# Patient Record
Sex: Male | Born: 1940 | Race: White | Hispanic: No | Marital: Married | Smoking: Former smoker
Health system: Southern US, Community
[De-identification: ages and names within clinical notes are randomized; demographics above are authoritative.]

## PROBLEM LIST (undated history)

## (undated) DIAGNOSIS — H409 Unspecified glaucoma: Secondary | ICD-10-CM

## (undated) DIAGNOSIS — C679 Malignant neoplasm of bladder, unspecified: Secondary | ICD-10-CM

## (undated) DIAGNOSIS — M1990 Unspecified osteoarthritis, unspecified site: Secondary | ICD-10-CM

## (undated) DIAGNOSIS — J449 Chronic obstructive pulmonary disease, unspecified: Secondary | ICD-10-CM

## (undated) DIAGNOSIS — M5030 Other cervical disc degeneration, unspecified cervical region: Secondary | ICD-10-CM

## (undated) DIAGNOSIS — IMO0001 Reserved for inherently not codable concepts without codable children: Secondary | ICD-10-CM

## (undated) DIAGNOSIS — T4145XA Adverse effect of unspecified anesthetic, initial encounter: Secondary | ICD-10-CM

## (undated) DIAGNOSIS — Q639 Congenital malformation of kidney, unspecified: Secondary | ICD-10-CM

## (undated) DIAGNOSIS — I10 Essential (primary) hypertension: Secondary | ICD-10-CM

## (undated) DIAGNOSIS — E119 Type 2 diabetes mellitus without complications: Secondary | ICD-10-CM

## (undated) DIAGNOSIS — E7800 Pure hypercholesterolemia, unspecified: Secondary | ICD-10-CM

## (undated) HISTORY — DX: Unspecified glaucoma: H40.9

## (undated) HISTORY — DX: Malignant neoplasm of bladder, unspecified: C67.9

## (undated) HISTORY — DX: Essential (primary) hypertension: I10

## (undated) HISTORY — PX: EYE SURGERY: SHX253

## (undated) HISTORY — DX: Pure hypercholesterolemia, unspecified: E78.00

## (undated) HISTORY — PX: TONSILLECTOMY: SUR1361

## (undated) HISTORY — PX: COLONOSCOPY W/ POLYPECTOMY: SHX1380

## (undated) HISTORY — DX: Unspecified osteoarthritis, unspecified site: M19.90

---
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
## 1987-04-03 HISTORY — PX: CERVICAL DISC SURGERY: SHX588

## 1999-03-21 ENCOUNTER — Ambulatory Visit (HOSPITAL_COMMUNITY): Admission: RE | Admit: 1999-03-21 | Discharge: 1999-03-21 | Payer: Self-pay | Admitting: Gastroenterology

## 1999-03-21 ENCOUNTER — Encounter (INDEPENDENT_AMBULATORY_CARE_PROVIDER_SITE_OTHER): Payer: Self-pay

## 1999-04-03 DIAGNOSIS — T8859XA Other complications of anesthesia, initial encounter: Secondary | ICD-10-CM

## 1999-04-03 DIAGNOSIS — C679 Malignant neoplasm of bladder, unspecified: Secondary | ICD-10-CM

## 1999-04-03 HISTORY — DX: Other complications of anesthesia, initial encounter: T88.59XA

## 1999-04-03 HISTORY — PX: BLADDER SURGERY: SHX569

## 1999-04-03 HISTORY — DX: Malignant neoplasm of bladder, unspecified: C67.9

## 1999-08-24 ENCOUNTER — Encounter: Payer: Self-pay | Admitting: Urology

## 1999-08-25 ENCOUNTER — Ambulatory Visit (HOSPITAL_COMMUNITY): Admission: RE | Admit: 1999-08-25 | Discharge: 1999-08-25 | Payer: Self-pay | Admitting: Dermatology

## 1999-08-25 ENCOUNTER — Encounter (INDEPENDENT_AMBULATORY_CARE_PROVIDER_SITE_OTHER): Payer: Self-pay | Admitting: Specialist

## 1999-09-26 ENCOUNTER — Encounter (INDEPENDENT_AMBULATORY_CARE_PROVIDER_SITE_OTHER): Payer: Self-pay

## 1999-09-26 ENCOUNTER — Ambulatory Visit (HOSPITAL_COMMUNITY): Admission: RE | Admit: 1999-09-26 | Discharge: 1999-09-26 | Payer: Self-pay | Admitting: Dermatology

## 2000-02-16 ENCOUNTER — Encounter (INDEPENDENT_AMBULATORY_CARE_PROVIDER_SITE_OTHER): Payer: Self-pay | Admitting: Specialist

## 2000-02-16 ENCOUNTER — Ambulatory Visit (HOSPITAL_COMMUNITY): Admission: RE | Admit: 2000-02-16 | Discharge: 2000-02-16 | Payer: Self-pay | Admitting: Urology

## 2000-06-03 ENCOUNTER — Other Ambulatory Visit: Admission: RE | Admit: 2000-06-03 | Discharge: 2000-06-03 | Payer: Self-pay | Admitting: Urology

## 2002-05-08 ENCOUNTER — Ambulatory Visit (HOSPITAL_COMMUNITY): Admission: RE | Admit: 2002-05-08 | Discharge: 2002-05-08 | Payer: Self-pay | Admitting: Gastroenterology

## 2002-05-08 ENCOUNTER — Encounter (INDEPENDENT_AMBULATORY_CARE_PROVIDER_SITE_OTHER): Payer: Self-pay | Admitting: Specialist

## 2007-04-11 ENCOUNTER — Encounter: Admission: RE | Admit: 2007-04-11 | Discharge: 2007-04-11 | Payer: Self-pay | Admitting: Internal Medicine

## 2007-04-18 ENCOUNTER — Encounter: Payer: Self-pay | Admitting: Cardiology

## 2010-08-18 NOTE — Op Note (Signed)
[facility]  Patient:    Justin Atkinson, Justin Atkinson                      MRN: [id number] Proc. Date: 09/26/99 Adm. 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  OPERATION PERFORMED:  Cystourethroscopy, cold cut biopsy of tumor resection margin.  SURGEON:  Lucrezia Starch. Ovidio Hanger, M.D.  ANESTHESIA:  General laryngeal airway.  ESTIMATED BLOOD LOSS:  Negligible.  TUBES:  None.  COMPLICATIONS:  None.  INDICATION FOR PROCEDURE:  The patient is a very nice 70 year old white male who presented with gross hematuria and was found to have a right bladder wall lesion. It was subsequently resected TUR bladder tumor and the final pathology revealed grade 3 superficial transitional cell carcinoma. It was felt that intravesical chemotherapy was probably indicated but that biopsy of the margin resection was probably indicated to make sure there are no further cells present and that there was no invasive process. After understanding the risks, benefits, and alternatives, he elected to proceed.  DESCRIPTION OF PROCEDURE:  The patient was placed in supine position and after proper general laryngeal airway anesthesia was placed in the dorsal lithotomy position and prepped and draped with Betadine in a sterile fashion. Cystourethroscopy was performed with a 23.5 [place] Olympus panendoscope. The meatus was a bit tight and needed to be dilated to 28 [place] with [employer]. There was mild trilobar hypertrophy. Efflux of clear urine was noted from the right ureteral orifice. The left ureteral orifice was somewhat under the bladder neck and somewhat difficult  to visualize although I think I saw it obviously it was well away from any area of resection although it was difficult to visualize. The area of tumor resection was healing well and with cold biopsy forceps, 4 quadrant biopsies were obtained at the margin of it and submitted to pathology together. The base was cauterized with Bugbee coagulation cautery and no further lesions were noted to be present. The bladder was drained. The panendoscope was removed and the patient was taken to the recovery room stable. DD:  09/26/99 TD:  09/27/99 Job: [id number]

## 2010-08-18 NOTE — Op Note (Signed)
[facility]  Patient:    Justin Atkinson, Justin Atkinson                     MRN: [id number] Proc. Date: 02/16/00 Attending:  Lucrezia Starch. Ovidio Hanger, M.D.                           Operative Report  DIAGNOSIS:  History of bladder tumor, status post Bacillus Calmette-Guerin intravesical immunotherapy.  OPERATIVE PROCEDURE:  Cystourethroscopy exam under anesthesia, bladder biopsy.  SURGEON:  Lucrezia Starch. Ovidio Hanger, M.D.  ANESTHESIA:  General laryngeal airway.  ESTIMATED BLOOD LOSS:  Negligible.  TUBES:  None.  COMPLICATIONS:  None.  INDICATION FOR PROCEDURE:  [name] is a very nice 70 year old white male who has a known solitary right kidney.  He presented with a large left bladder tumor and subsequently underwent cystoscopy and transurethral resection of the bladder tumor which revealed superficial disease only.  It was focally high in grade, but no invasion was noted.  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  It was felt due to the high-grade nature of the tumor, that he should undergo intravesical BCG, and this was subsequently performed in the office, six treatments in all.  He is for follow-up cystoscopy exam and biopsy.  DESCRIPTION OF PROCEDURE:  The patient was placed in the supine position after proper general laryngeal airway anesthesia, was placed in the dorsal lithotomy position and prepped and draped with Betadine in a sterile fashion. Cystourethroscopy was performed with a 22.5 [place] ACMI panendoscope utilizing the 12 and 70 degree lenses.  The bladder was carefully inspected.  A solitary right renal orifice was noted to be present and was essentially normal.  The bladder was inspected carefully, had grade 1 trabeculation.  There was mild trilobar hypertrophy of the prostate, but no lesions were noted to be present. Utilizing the cold-cup  biopsy forceps, biopsy was taken in the posterior bladder wall to the right in the region of where the tumor originally had been and submitted to pathology.  The base was cauterized with Bugbee coagulation cautery.  No other lesions were noted to be present.  The bladder was drained. The panendoscope was removed, and the patient was taken to the recovery room stable.  PLAN:  The plan is to follow him up in three months for cystourethroscopy pending the biopsy results.  He will call next week for the biopsy results and will proceed accordingly. DD:  02/16/00 TD:  02/16/00 Job: [id number]

## 2010-08-18 NOTE — Op Note (Signed)
NAME:  Justin Atkinson, Justin Atkinson                         ACCOUNT NO.:  [id number]   MEDICAL RECORD NO.:  [id number]                   PATIENT TYPE:  AMB   LOCATION:  ENDO                                 FACILITY:  [facility]   PHYSICIAN:  Petra Kuba, M.D.                 DATE OF BIRTH:  1940/12/16   DATE OF PROCEDURE:  05/08/2002  DATE OF DISCHARGE:                                 OPERATIVE REPORT   PROCEDURE:  Colonoscopy with biopsy.   INDICATIONS:  The patient has a history of adenomatous polyps, due for  repeat screening.  Consent was signed after risks, benefits, methods, and  options thoroughly discussed in the office in the past.   MEDICATIONS:  Demerol 75 mg, Versed 6 mg.   DESCRIPTION OF PROCEDURE:  Rectal inspection was pertinent for external  hemorrhoids, small.  Digital exam is negative.  The regular colonoscope was  inserted, fairly easily advanced around the colon to the cecum.  This did  require some abdominal pressure and no position changes.  On insertion no  abnormalities were seen.  The cecum was identified by the appendiceal  orifice and the ileocecal valve.  The scope was slowly withdrawn.  The prep  was adequate.  There was some liquid stool that required washing and  suctioning.  On slow withdrawal through the colon, the cecum, ascending,  transverse, and descending were all normal.  There was a rare early left-  sided diverticulum, some scarring in the sigmoid from previous polypectomy  sites was seen without any residual polyps.  At the sigmoid-rectal junction,  a tiny probable hyperplastic-appearing polyp was seen and was cold biopsied  x2.  The scope was withdrawn back to the rectum and then retroflexed,  pertinent for some internal hemorrhoids.  The scope was straightened and  advanced a short way up the left side of the colon, air was suctioned, the  scope removed.  The patient tolerated the procedure well.  There was no  obvious immediate complication.   ENDOSCOPIC DIAGNOSES:  1. Small internal-external hemorrhoids.  2. Tiny rectosigmoid junction probably hyperplastic-appearing polyp, cold     biopsied.  3. Rare early left-sided diverticula.  4. Otherwise within normal limits to the cecum.   PLAN:  Await pathology but probably but probably recheck colon screening in  five years.  Happy to see back p.r.n.  Otherwise return care to Dr. Waynard Edwards  for the customary health care maintenance to include yearly rectals and  guaiacs.                                               Petra Kuba, M.D.    MEM/MEDQ  D:  05/08/2002  T:  05/08/2002  Job:  [id number]   cc:   Loraine Leriche A. Perini,  M.D.  [address]  [place]  [place] [address]  Fax: [phone]

## 2010-08-18 NOTE — Op Note (Signed)
[facility]  Patient:    Justin Atkinson, Justin Atkinson                        MRN: [id number] Proc. Date: 08/25/99 Attending:  Lucrezia Starch. Ovidio Hanger, M.D.                           Operative Report  DIAGNOSES:  Bladder tumor, gross hematuria.  PROCEDURE:  Cystourethroscopy, transurethral resection of bladder tumor.  SURGEON:  Lucrezia Starch. Ovidio Hanger, M.D.  ANESTHESIA:  General laryngeal airway.  ESTIMATED BLOOD LOSS:  15 cc.  TUBES:  A 22 French 5 cc single-lumen Foley catheter.  COMPLICATIONS:  None.  INDICATION FOR PROCEDURE:  Justin Atkinson is a very nice 70 year old white male who had developed gross painless hematuria.  He has had this on and off since October 2000.  He has had three to four episodes.  Recently had pink urine. He underwent workup in the office, and on flexible cystourethroscopy, he was noted to have a large bladder tumor that was pedunculated on the right lateral bladder wall.  After understanding the risks, benefits, and alternatives, he has elected to undergo the above procedure.  DESCRIPTION OF PROCEDURE:  The patient was placed in the supine position under proper general laryngeal anesthesia, was placed in the dorsal lithotomy position and prepped and draped with Betadine in sterile fashion.  The urethra was calibrated to 30 [place] with [employer] sound, and a 28 [place] ACMI continuous-flow resectoscope sheath was placed over a 10 _____ obturator. Inspection of the bladder with 30 and 70 degree lenses revealed the tumor appeared to be slightly pedunculated, although it had a sessile base, was on the right lateral bladder wall, well away from the solitary right ureteral orifice.  No other lesions were noted throughout the bladder.  He had mild prostatic enlargement, trilobar, and grade 1 trabeculation.  Utilizing the working element and the 30 degree lens, a transurethral resection of the bladder tumor was resected all the way to the base, which  was resected into the superficial muscle.  No remaining tumor was noted to be present.  The base was cauterized with Bovie coagulation cautery, good hemostasis noted to be obtained.  All chips were evacuated and submitted to pathology.  Reinspection revealed no chips were within the bladder.  The resector sheath was visually removed, and a 22 French 5 cc balloon catheter was passed.  The bladder was noted to be very clear.  The patient was taken to the recovery room in stable condition. DD:  08/25/99 TD:  08/29/99 Job: [id number]

## 2011-04-10 DIAGNOSIS — H4011X0 Primary open-angle glaucoma, stage unspecified: Secondary | ICD-10-CM | POA: Diagnosis not present

## 2011-04-16 DIAGNOSIS — M25559 Pain in unspecified hip: Secondary | ICD-10-CM | POA: Diagnosis not present

## 2011-04-16 DIAGNOSIS — M999 Biomechanical lesion, unspecified: Secondary | ICD-10-CM | POA: Diagnosis not present

## 2011-04-16 DIAGNOSIS — M5137 Other intervertebral disc degeneration, lumbosacral region: Secondary | ICD-10-CM | POA: Diagnosis not present

## 2011-04-20 DIAGNOSIS — M25559 Pain in unspecified hip: Secondary | ICD-10-CM | POA: Diagnosis not present

## 2011-04-20 DIAGNOSIS — M999 Biomechanical lesion, unspecified: Secondary | ICD-10-CM | POA: Diagnosis not present

## 2011-04-20 DIAGNOSIS — M5137 Other intervertebral disc degeneration, lumbosacral region: Secondary | ICD-10-CM | POA: Diagnosis not present

## 2011-04-23 DIAGNOSIS — IMO0002 Reserved for concepts with insufficient information to code with codable children: Secondary | ICD-10-CM | POA: Diagnosis not present

## 2011-04-23 DIAGNOSIS — S335XXA Sprain of ligaments of lumbar spine, initial encounter: Secondary | ICD-10-CM | POA: Diagnosis not present

## 2011-05-23 DIAGNOSIS — M5137 Other intervertebral disc degeneration, lumbosacral region: Secondary | ICD-10-CM | POA: Diagnosis not present

## 2011-05-23 DIAGNOSIS — M25559 Pain in unspecified hip: Secondary | ICD-10-CM | POA: Diagnosis not present

## 2011-05-23 DIAGNOSIS — M999 Biomechanical lesion, unspecified: Secondary | ICD-10-CM | POA: Diagnosis not present

## 2011-07-17 DIAGNOSIS — R7301 Impaired fasting glucose: Secondary | ICD-10-CM | POA: Diagnosis not present

## 2011-07-17 DIAGNOSIS — E785 Hyperlipidemia, unspecified: Secondary | ICD-10-CM | POA: Diagnosis not present

## 2011-07-17 DIAGNOSIS — Z125 Encounter for screening for malignant neoplasm of prostate: Secondary | ICD-10-CM | POA: Diagnosis not present

## 2011-07-17 DIAGNOSIS — C679 Malignant neoplasm of bladder, unspecified: Secondary | ICD-10-CM | POA: Diagnosis not present

## 2011-07-25 DIAGNOSIS — K219 Gastro-esophageal reflux disease without esophagitis: Secondary | ICD-10-CM | POA: Diagnosis not present

## 2011-07-25 DIAGNOSIS — Z125 Encounter for screening for malignant neoplasm of prostate: Secondary | ICD-10-CM | POA: Diagnosis not present

## 2011-07-25 DIAGNOSIS — E785 Hyperlipidemia, unspecified: Secondary | ICD-10-CM | POA: Diagnosis not present

## 2011-07-25 DIAGNOSIS — C679 Malignant neoplasm of bladder, unspecified: Secondary | ICD-10-CM | POA: Diagnosis not present

## 2011-07-25 DIAGNOSIS — Z23 Encounter for immunization: Secondary | ICD-10-CM | POA: Diagnosis not present

## 2011-07-25 DIAGNOSIS — Z0000 Encounter for general adult medical examination without abnormal findings: Secondary | ICD-10-CM | POA: Diagnosis not present

## 2011-07-25 DIAGNOSIS — R7301 Impaired fasting glucose: Secondary | ICD-10-CM | POA: Diagnosis not present

## 2011-07-25 DIAGNOSIS — F17200 Nicotine dependence, unspecified, uncomplicated: Secondary | ICD-10-CM | POA: Diagnosis not present

## 2011-07-26 DIAGNOSIS — Z1212 Encounter for screening for malignant neoplasm of rectum: Secondary | ICD-10-CM | POA: Diagnosis not present

## 2011-08-01 DIAGNOSIS — M545 Low back pain: Secondary | ICD-10-CM | POA: Diagnosis not present

## 2011-08-01 DIAGNOSIS — M5137 Other intervertebral disc degeneration, lumbosacral region: Secondary | ICD-10-CM | POA: Diagnosis not present

## 2011-08-07 ENCOUNTER — Other Ambulatory Visit: Payer: Self-pay | Admitting: Internal Medicine

## 2011-08-07 DIAGNOSIS — N281 Cyst of kidney, acquired: Secondary | ICD-10-CM

## 2011-08-08 ENCOUNTER — Ambulatory Visit
Admission: RE | Admit: 2011-08-08 | Discharge: 2011-08-08 | Disposition: A | Discharge status: Home / Self Care | Payer: Medicare Other | Source: Ambulatory Visit | Attending: Internal Medicine | Admitting: Internal Medicine

## 2011-08-08 DIAGNOSIS — Z905 Acquired absence of kidney: Secondary | ICD-10-CM | POA: Diagnosis not present

## 2011-08-08 DIAGNOSIS — N281 Cyst of kidney, acquired: Secondary | ICD-10-CM

## 2011-08-09 DIAGNOSIS — M5137 Other intervertebral disc degeneration, lumbosacral region: Secondary | ICD-10-CM | POA: Diagnosis not present

## 2011-08-14 DIAGNOSIS — M5137 Other intervertebral disc degeneration, lumbosacral region: Secondary | ICD-10-CM | POA: Diagnosis not present

## 2011-08-16 DIAGNOSIS — M5137 Other intervertebral disc degeneration, lumbosacral region: Secondary | ICD-10-CM | POA: Diagnosis not present

## 2011-08-20 DIAGNOSIS — M5137 Other intervertebral disc degeneration, lumbosacral region: Secondary | ICD-10-CM | POA: Diagnosis not present

## 2011-08-23 DIAGNOSIS — M5137 Other intervertebral disc degeneration, lumbosacral region: Secondary | ICD-10-CM | POA: Diagnosis not present

## 2011-08-28 DIAGNOSIS — M5137 Other intervertebral disc degeneration, lumbosacral region: Secondary | ICD-10-CM | POA: Diagnosis not present

## 2011-08-30 DIAGNOSIS — M5137 Other intervertebral disc degeneration, lumbosacral region: Secondary | ICD-10-CM | POA: Diagnosis not present

## 2011-09-03 ENCOUNTER — Ambulatory Visit (INDEPENDENT_AMBULATORY_CARE_PROVIDER_SITE_OTHER): Payer: Medicare Other | Admitting: Cardiology

## 2011-09-03 ENCOUNTER — Encounter: Payer: Self-pay | Admitting: Cardiology

## 2011-09-03 VITALS — BP 142/73 | HR 56 | Ht 74.0 in | Wt 277.8 lb

## 2011-09-03 DIAGNOSIS — E785 Hyperlipidemia, unspecified: Secondary | ICD-10-CM | POA: Diagnosis not present

## 2011-09-03 DIAGNOSIS — F17200 Nicotine dependence, unspecified, uncomplicated: Secondary | ICD-10-CM

## 2011-09-03 DIAGNOSIS — I10 Essential (primary) hypertension: Secondary | ICD-10-CM

## 2011-09-03 DIAGNOSIS — E7800 Pure hypercholesterolemia, unspecified: Secondary | ICD-10-CM

## 2011-09-03 DIAGNOSIS — Z720 Tobacco use: Secondary | ICD-10-CM | POA: Insufficient documentation

## 2011-09-03 DIAGNOSIS — R079 Chest pain, unspecified: Secondary | ICD-10-CM

## 2011-09-03 DIAGNOSIS — R0789 Other chest pain: Secondary | ICD-10-CM

## 2011-09-03 NOTE — Patient Instructions (Signed)
We will schedule you for a nuclear stress test  You need to quit smoking!   

## 2011-09-03 NOTE — Assessment & Plan Note (Signed)
I explained the importance of smoking cessation for lowering his overall risk. He is interested in quitting and has a prescription for Chantix that he has not filled yet.

## 2011-09-03 NOTE — Assessment & Plan Note (Signed)
This patient has atypical chest pain but does have multiple risk factors for cardiovascular disease. It has been 4 years since his prior stress evaluation. We discussed the options of repeat evaluation versus watchful waiting and he is elected to proceed with repeat stress testing. We will schedule him for a lexiscan Myoview study.

## 2011-09-03 NOTE — Progress Notes (Signed)
Justin Atkinson Date of Birth: Aug 23, 1940 Medical Record #[id number]  History of Present Illness: Justin Atkinson is seen at the request of Dr. Waynard Edwards for evaluation of chest pain. He is a pleasant 71 year old white male. He has a history of hypertension, hyperlipidemia, and tobacco abuse. He states that he has had sporadic chest pain for several years. He describes this as a discomfort in the left upper pectoral region. It is not clearly associated with activity. It occurs about every 3-4 months. It is not clearly associated with anything. He does note some relief with belching or with moving his shoulder. Apparently he was seen in early 2009 and underwent a pharmacologic nuclear stress test at that time. I do not have the results at this moment but apparently it was normal.  Current Outpatient Prescriptions on File Prior to Visit  Medication Sig Dispense Refill  . aspirin 81 MG tablet Take 81 mg by mouth daily.      . Cholecalciferol (VITAMIN D-3 PO) Take 1,000 Units by mouth daily.      Marland Kitchen latanoprost (XALATAN) 0.005 % ophthalmic solution Place 1 drop into both eyes at bedtime.      Marland Kitchen lisinopril (PRINIVIL,ZESTRIL) 10 MG tablet Take 10 mg by mouth daily.      . Multiple Vitamins-Minerals (MULTIVITAMIN PO) Take by mouth daily.      . Omega-3 Fatty Acids (FISH OIL) 1200 MG CAPS Take by mouth. Taking 2 Tablets Daily      . rosuvastatin (CRESTOR) 5 MG tablet Take 5 mg by mouth daily.        No Known Allergies  Past Medical History  Diagnosis Date  . Bladder cancer     Surgery  . DJD (degenerative joint disease)   . Glaucoma   . Hypercholesterolemia   . HTN (hypertension)     Past Surgical History  Procedure Date  . Cervical disc surgery 1989  . Hernia repair 1960  . Bladder surgery     cystoscopic    History  Smoking status  . Current Everyday Smoker -- 1.0 packs/day  Smokeless tobacco  . Not on file    History  Alcohol Use     History reviewed. No pertinent family  history.  Review of Systems: The review of systems is positive for chronic low back pain. MRI revealed degenerative joint disease. As a result he has not been as active. He does have situational stress taking care of a wife who has advanced multiple sclerosis and is disabled.  All other systems were reviewed and are negative.  Physical Exam: BP 142/73  Pulse 56  Ht 6\' 2"  (1.88 m)  Wt 277 lb 12.8 oz (126.009 kg)  BMI 35.67 kg/m2 He is an obese, pleasant white male in no acute distress.The patient is alert and oriented x 3.  The mood and affect are normal.  The skin is warm and dry.  Color is normal.  The HEENT exam reveals that the sclera are nonicteric.  The mucous membranes are moist.  The carotids are 2+ without bruits.  There is no thyromegaly.  There is no JVD.  The lungs are clear.  The chest wall is non tender.  The heart exam reveals a regular rate with a normal S1 and S2.  There are no murmurs, gallops, or rubs.  The PMI is not displaced.   Abdominal exam reveals good bowel sounds.  There is no guarding or rebound.  There is no hepatosplenomegaly or tenderness.  There are no masses.  Exam of the legs reveal no clubbing, cyanosis, or edema.  The legs are without rashes.  The distal pulses are intact.  Cranial nerves II - XII are intact.  Motor and sensory functions are intact.  The gait is normal.  LABORATORY DATA: ECG demonstrates normal sinus rhythm with occasional PVCs. It is otherwise normal.  Assessment / Plan:

## 2011-09-04 DIAGNOSIS — M5137 Other intervertebral disc degeneration, lumbosacral region: Secondary | ICD-10-CM | POA: Diagnosis not present

## 2011-09-04 DIAGNOSIS — F17200 Nicotine dependence, unspecified, uncomplicated: Secondary | ICD-10-CM | POA: Diagnosis not present

## 2011-09-10 DIAGNOSIS — M5137 Other intervertebral disc degeneration, lumbosacral region: Secondary | ICD-10-CM | POA: Diagnosis not present

## 2011-09-14 ENCOUNTER — Encounter: Payer: Self-pay | Admitting: Cardiology

## 2011-09-17 DIAGNOSIS — R972 Elevated prostate specific antigen [PSA]: Secondary | ICD-10-CM | POA: Diagnosis not present

## 2011-09-17 DIAGNOSIS — N320 Bladder-neck obstruction: Secondary | ICD-10-CM | POA: Diagnosis not present

## 2011-09-17 DIAGNOSIS — N400 Enlarged prostate without lower urinary tract symptoms: Secondary | ICD-10-CM | POA: Insufficient documentation

## 2011-09-17 DIAGNOSIS — Z8551 Personal history of malignant neoplasm of bladder: Secondary | ICD-10-CM | POA: Diagnosis not present

## 2011-09-18 ENCOUNTER — Ambulatory Visit (HOSPITAL_COMMUNITY): Payer: Medicare Other | Attending: Cardiology | Admitting: Radiology

## 2011-09-18 VITALS — BP 132/84 | HR 50 | Ht 74.0 in | Wt 276.0 lb

## 2011-09-18 DIAGNOSIS — R079 Chest pain, unspecified: Secondary | ICD-10-CM

## 2011-09-18 DIAGNOSIS — F17200 Nicotine dependence, unspecified, uncomplicated: Secondary | ICD-10-CM | POA: Insufficient documentation

## 2011-09-18 DIAGNOSIS — I10 Essential (primary) hypertension: Secondary | ICD-10-CM

## 2011-09-18 DIAGNOSIS — Z720 Tobacco use: Secondary | ICD-10-CM

## 2011-09-18 DIAGNOSIS — R0602 Shortness of breath: Secondary | ICD-10-CM

## 2011-09-18 DIAGNOSIS — I4949 Other premature depolarization: Secondary | ICD-10-CM

## 2011-09-18 DIAGNOSIS — E7800 Pure hypercholesterolemia, unspecified: Secondary | ICD-10-CM | POA: Diagnosis not present

## 2011-09-18 DIAGNOSIS — E785 Hyperlipidemia, unspecified: Secondary | ICD-10-CM

## 2011-09-18 MED ORDER — TECHNETIUM TC 99M TETROFOSMIN IV KIT
33.0000 | PACK | Freq: Once | INTRAVENOUS | Status: AC | PRN
  Administered 2011-09-18: 33 via INTRAVENOUS

## 2011-09-18 MED ORDER — TECHNETIUM TC 99M TETROFOSMIN IV KIT
11.0000 | PACK | Freq: Once | INTRAVENOUS | Status: AC | PRN
  Administered 2011-09-18: 11 via INTRAVENOUS

## 2011-09-18 MED ORDER — REGADENOSON 0.4 MG/5ML IV SOLN
0.4000 mg | Freq: Once | INTRAVENOUS | Status: AC
  Administered 2011-09-18: 0.4 mg via INTRAVENOUS

## 2011-09-18 NOTE — Progress Notes (Signed)
[facility] SITE 3 NUCLEAR MED [address] [place] [address] [phone]  Cardiology Nuclear Med Study  Justin Atkinson is a 71 y.o. male     MRN : [id number]     DOB: 08/23/1940  Procedure Date: 09/18/2011  Nuclear Med Background Indication for Stress Test:  Evaluation for Ischemia History:  '09 UXL:KGMWNU. Cardiac Risk Factors: Hypertension, Lipids, Obesity and Smoker  Symptoms:  "Dull" Chest Pain (last episode of chest discomfort was 5-6 weeks ago) and DOE   Nuclear Pre-Procedure Caffeine/Decaff Intake:  None NPO After: 8:00pm   Lungs:  Clear. O2 Sat: 95% on room air. IV 0.9% NS with Angio Cath:  18g  IV Site: R Antecubital  IV Started by:  Stanton Kidney, EMT-P  Chest Size (in):  48 Cup Size: n/a  Height: 6\' 2"  (1.88 m)  Weight:  276 lb (125.193 kg)  BMI:  Body mass index is 35.44 kg/(m^2). Tech Comments:  NA    Nuclear Med Study 1 or 2 day study: 1 day  Stress Test Type:  Treadmill/Lexiscan  Reading MD: Kristeen Miss, MD  Order Authorizing Provider:  Peter Swaziland, MD  Resting Radionuclide: Technetium 19m Tetrofosmin  Resting Radionuclide Dose: 10.1 mCi   Stress Radionuclide:  Technetium 39m Tetrofosmin  Stress Radionuclide Dose: 33.0 mCi           Stress Protocol Rest HR: 50 Stress HR: 78  Rest BP: 132/84 Stress BP: 145/72  Exercise Time (min): 2:00 METS: n/a   Predicted Max HR: 151 bpm % Max HR: 51.66 bpm Rate Pressure Product: 27253   Dose of Adenosine (mg):  n/a Dose of Lexiscan: 0.4 mg  Dose of Atropine (mg): n/a Dose of Dobutamine: n/a mcg/kg/min (at max HR)  Stress Test Technologist: Smiley Houseman, CMA-N  Nuclear Technologist:  Domenic Polite, CNMT     Rest Procedure:  Myocardial perfusion imaging was performed at rest 45 minutes following the intravenous administration of Technetium 69m Tetrofosmin.  Rest ECG: Sinus bradycardia with occasional PVC's.  Stress Procedure:  The patient received IV Lexiscan 0.4 mg over 15-seconds  with concurrent low level exercise and then Technetium 85m Tetrofosmin was injected at 30-seconds while the patient continued walking one more minute. There were no significant changes with Lexiscan bolus, occasional PVC's were noted. Quantitative spect images were obtained after a 45-minute delay.  Stress ECG: No significant change from baseline ECG  QPS Raw Data Images:  Normal; no motion artifact; normal heart/lung ratio. Stress Images:  There is a small mild defect in the apex with relatively normal uptake in the remaining areas.  The LV is mildly dilated. Rest Images:  There is a small very mild defect in the apex with relatively normal uptake in the remaining areas.  The LV is mildly dilated. Subtraction (SDS):  There is no significant degree of reversibility.    Transient Ischemic Dilatation (Normal <1.22):  0.96 Lung/Heart Ratio (Normal <0.45):  0.36  Quantitative Gated Spect Images QGS EDV:  158 ml QGS ESV:  72 ml  Impression Exercise Capacity:  Lexiscan with low level exercise. BP Response:  Normal blood pressure response. Clinical Symptoms:  No significant symptoms noted. ECG Impression:  No significant ST segment change suggestive of ischemia. Comparison with Prior Nuclear Study: No images to compare  Overall Impression:  Normal stress nuclear study.  There is a small apical defect that appears to be due to apical thinning.  The apex contracts normally.  The LV is mildly dilated and the EF  is at the lower limits of normal.  LV Ejection Fraction: 54%.  LV Wall Motion:  Normal Wall Motion    Justin Atkinson, Justin Atkinson., MD, [facility] 09/18/2011, 4:51 PM Office - [phone] Pager [phone]

## 2011-10-08 DIAGNOSIS — H409 Unspecified glaucoma: Secondary | ICD-10-CM | POA: Diagnosis not present

## 2011-10-08 DIAGNOSIS — H4011X0 Primary open-angle glaucoma, stage unspecified: Secondary | ICD-10-CM | POA: Diagnosis not present

## 2011-10-25 DIAGNOSIS — Z8551 Personal history of malignant neoplasm of bladder: Secondary | ICD-10-CM | POA: Diagnosis not present

## 2011-12-28 DIAGNOSIS — D239 Other benign neoplasm of skin, unspecified: Secondary | ICD-10-CM | POA: Diagnosis not present

## 2011-12-28 DIAGNOSIS — L909 Atrophic disorder of skin, unspecified: Secondary | ICD-10-CM | POA: Diagnosis not present

## 2011-12-28 DIAGNOSIS — L570 Actinic keratosis: Secondary | ICD-10-CM | POA: Diagnosis not present

## 2011-12-28 DIAGNOSIS — L821 Other seborrheic keratosis: Secondary | ICD-10-CM | POA: Diagnosis not present

## 2011-12-28 DIAGNOSIS — L738 Other specified follicular disorders: Secondary | ICD-10-CM | POA: Diagnosis not present

## 2011-12-28 DIAGNOSIS — L820 Inflamed seborrheic keratosis: Secondary | ICD-10-CM | POA: Diagnosis not present

## 2012-01-04 DIAGNOSIS — Z23 Encounter for immunization: Secondary | ICD-10-CM | POA: Diagnosis not present

## 2012-01-16 DIAGNOSIS — R7301 Impaired fasting glucose: Secondary | ICD-10-CM | POA: Diagnosis not present

## 2012-04-08 DIAGNOSIS — H409 Unspecified glaucoma: Secondary | ICD-10-CM | POA: Diagnosis not present

## 2012-04-08 DIAGNOSIS — H4011X0 Primary open-angle glaucoma, stage unspecified: Secondary | ICD-10-CM | POA: Diagnosis not present

## 2012-04-11 DIAGNOSIS — L578 Other skin changes due to chronic exposure to nonionizing radiation: Secondary | ICD-10-CM | POA: Diagnosis not present

## 2012-04-11 DIAGNOSIS — L821 Other seborrheic keratosis: Secondary | ICD-10-CM | POA: Diagnosis not present

## 2012-04-11 DIAGNOSIS — L820 Inflamed seborrheic keratosis: Secondary | ICD-10-CM | POA: Diagnosis not present

## 2012-04-11 DIAGNOSIS — L570 Actinic keratosis: Secondary | ICD-10-CM | POA: Diagnosis not present

## 2012-04-11 DIAGNOSIS — D1801 Hemangioma of skin and subcutaneous tissue: Secondary | ICD-10-CM | POA: Diagnosis not present

## 2012-04-11 DIAGNOSIS — D239 Other benign neoplasm of skin, unspecified: Secondary | ICD-10-CM | POA: Diagnosis not present

## 2012-07-02 DIAGNOSIS — E669 Obesity, unspecified: Secondary | ICD-10-CM | POA: Diagnosis not present

## 2012-07-02 DIAGNOSIS — IMO0002 Reserved for concepts with insufficient information to code with codable children: Secondary | ICD-10-CM | POA: Diagnosis not present

## 2012-07-03 DIAGNOSIS — M999 Biomechanical lesion, unspecified: Secondary | ICD-10-CM | POA: Diagnosis not present

## 2012-07-03 DIAGNOSIS — IMO0002 Reserved for concepts with insufficient information to code with codable children: Secondary | ICD-10-CM | POA: Diagnosis not present

## 2012-07-03 DIAGNOSIS — M5137 Other intervertebral disc degeneration, lumbosacral region: Secondary | ICD-10-CM | POA: Diagnosis not present

## 2012-07-03 DIAGNOSIS — M25559 Pain in unspecified hip: Secondary | ICD-10-CM | POA: Diagnosis not present

## 2012-07-07 DIAGNOSIS — M999 Biomechanical lesion, unspecified: Secondary | ICD-10-CM | POA: Diagnosis not present

## 2012-07-07 DIAGNOSIS — IMO0002 Reserved for concepts with insufficient information to code with codable children: Secondary | ICD-10-CM | POA: Diagnosis not present

## 2012-07-07 DIAGNOSIS — M5137 Other intervertebral disc degeneration, lumbosacral region: Secondary | ICD-10-CM | POA: Diagnosis not present

## 2012-07-07 DIAGNOSIS — M25559 Pain in unspecified hip: Secondary | ICD-10-CM | POA: Diagnosis not present

## 2012-07-08 DIAGNOSIS — IMO0002 Reserved for concepts with insufficient information to code with codable children: Secondary | ICD-10-CM | POA: Diagnosis not present

## 2012-07-08 DIAGNOSIS — M999 Biomechanical lesion, unspecified: Secondary | ICD-10-CM | POA: Diagnosis not present

## 2012-07-08 DIAGNOSIS — M25559 Pain in unspecified hip: Secondary | ICD-10-CM | POA: Diagnosis not present

## 2012-07-08 DIAGNOSIS — M5137 Other intervertebral disc degeneration, lumbosacral region: Secondary | ICD-10-CM | POA: Diagnosis not present

## 2012-07-10 DIAGNOSIS — M999 Biomechanical lesion, unspecified: Secondary | ICD-10-CM | POA: Diagnosis not present

## 2012-07-10 DIAGNOSIS — IMO0002 Reserved for concepts with insufficient information to code with codable children: Secondary | ICD-10-CM | POA: Diagnosis not present

## 2012-07-10 DIAGNOSIS — M5137 Other intervertebral disc degeneration, lumbosacral region: Secondary | ICD-10-CM | POA: Diagnosis not present

## 2012-07-10 DIAGNOSIS — M25559 Pain in unspecified hip: Secondary | ICD-10-CM | POA: Diagnosis not present

## 2012-07-14 DIAGNOSIS — IMO0002 Reserved for concepts with insufficient information to code with codable children: Secondary | ICD-10-CM | POA: Diagnosis not present

## 2012-07-14 DIAGNOSIS — M999 Biomechanical lesion, unspecified: Secondary | ICD-10-CM | POA: Diagnosis not present

## 2012-07-14 DIAGNOSIS — M5137 Other intervertebral disc degeneration, lumbosacral region: Secondary | ICD-10-CM | POA: Diagnosis not present

## 2012-07-14 DIAGNOSIS — M25559 Pain in unspecified hip: Secondary | ICD-10-CM | POA: Diagnosis not present

## 2012-07-17 DIAGNOSIS — M5137 Other intervertebral disc degeneration, lumbosacral region: Secondary | ICD-10-CM | POA: Diagnosis not present

## 2012-07-17 DIAGNOSIS — IMO0002 Reserved for concepts with insufficient information to code with codable children: Secondary | ICD-10-CM | POA: Diagnosis not present

## 2012-07-17 DIAGNOSIS — M25559 Pain in unspecified hip: Secondary | ICD-10-CM | POA: Diagnosis not present

## 2012-07-17 DIAGNOSIS — M999 Biomechanical lesion, unspecified: Secondary | ICD-10-CM | POA: Diagnosis not present

## 2012-07-22 DIAGNOSIS — M5137 Other intervertebral disc degeneration, lumbosacral region: Secondary | ICD-10-CM | POA: Diagnosis not present

## 2012-07-22 DIAGNOSIS — K4090 Unilateral inguinal hernia, without obstruction or gangrene, not specified as recurrent: Secondary | ICD-10-CM | POA: Diagnosis not present

## 2012-07-22 DIAGNOSIS — M999 Biomechanical lesion, unspecified: Secondary | ICD-10-CM | POA: Diagnosis not present

## 2012-07-22 DIAGNOSIS — IMO0002 Reserved for concepts with insufficient information to code with codable children: Secondary | ICD-10-CM | POA: Diagnosis not present

## 2012-07-22 DIAGNOSIS — Z6836 Body mass index (BMI) 36.0-36.9, adult: Secondary | ICD-10-CM | POA: Diagnosis not present

## 2012-07-22 DIAGNOSIS — E669 Obesity, unspecified: Secondary | ICD-10-CM | POA: Diagnosis not present

## 2012-07-22 DIAGNOSIS — M25559 Pain in unspecified hip: Secondary | ICD-10-CM | POA: Diagnosis not present

## 2012-07-23 ENCOUNTER — Encounter (INDEPENDENT_AMBULATORY_CARE_PROVIDER_SITE_OTHER): Payer: Self-pay | Admitting: Surgery

## 2012-07-23 ENCOUNTER — Ambulatory Visit (INDEPENDENT_AMBULATORY_CARE_PROVIDER_SITE_OTHER): Payer: Medicare Other | Admitting: Surgery

## 2012-07-23 VITALS — BP 134/82 | HR 60 | Temp 97.9°F | Resp 16 | Ht 74.0 in | Wt 278.0 lb

## 2012-07-23 DIAGNOSIS — K4090 Unilateral inguinal hernia, without obstruction or gangrene, not specified as recurrent: Secondary | ICD-10-CM

## 2012-07-23 NOTE — Patient Instructions (Signed)
[facility]  HERNIA REPAIR POST OP INSTRUCTIONS  Always review your discharge instruction sheet given to you by the facility where your surgery was performed.  1. A  prescription for pain medication may be given to you upon discharge.  Take your pain medication as prescribed.  If narcotic pain medicine is not needed, then you may take acetaminophen (Tylenol) or ibuprofen (Advil) as needed.  2. Take your usually prescribed medications unless otherwise directed.  3. If you need a refill on your pain medication, please contact your pharmacy.  They will contact our office to request authorization. Prescriptions will not be filled after 5 pm daily or on weekends.  4. You should follow a light diet the first 24 hours after arrival home, such as soup and crackers or toast.  Be sure to include plenty of fluids daily.  Resume your normal diet the day after surgery.  5. Most patients will experience some swelling and bruising around the surgical site.  Ice packs and reclining will help.  Swelling and bruising can take several days to resolve.   6. It is common to experience some constipation if taking pain medication after surgery.  Increasing fluid intake and taking a stool softener (such as Colace) will usually help or prevent this problem from occurring.  A mild laxative (Milk of Magnesia or Miralax) should be taken according to package directions if there are no bowel movements after 48 hours.  7. Unless discharge instructions indicate otherwise, you may remove your bandages 24-48 hours after surgery, and you may shower at that time.  You may have steri-strips (small skin tapes) in place directly over the incision.  These strips should be left on the skin for 7-10 days.  If your surgeon used skin glue on the incision, you may shower in 24 hours.  The glue will flake off over the next 2-3 weeks.  Any sutures or staples will be removed at the office during your follow-up  visit.  8. ACTIVITIES:  You may resume regular (light) daily activities beginning the next day-such as daily self-care, walking, climbing stairs-gradually increasing activities as tolerated.  You may have sexual intercourse when it is comfortable.  Refrain from any heavy lifting or straining until approved by your doctor.  You may drive when you are no longer taking prescription pain medication, you can comfortably wear a seatbelt, and you can safely maneuver your car and apply brakes.  9. You should see your doctor in the office for a follow-up appointment approximately 2-3 weeks after your surgery.  Make sure that you call for this appointment within a day or two after you arrive home to insure a convenient appointment time. 10.   WHEN TO CALL YOUR DOCTOR: 1. Fever greater than 101.0 2. Inability to urinate 3. Persistent nausea and/or vomiting 4. Extreme swelling or bruising 5. Continued bleeding from incision 6. Increased pain, redness, or drainage from the incision  The clinic staff is available to answer your questions during regular business hours.  Please don't hesitate to call and ask to speak to one of the nurses for clinical concerns.  If you have a medical emergency, go to the nearest emergency room or call 911.  A surgeon from [facility] is always on call for the hospital.   [facility] [address], [address], [place], [place]  [address]  [phone] ? [phone] ? FAX [phone]  [email or web address]   

## 2012-07-23 NOTE — Progress Notes (Signed)
General Surgery [facility]  Chief Complaint  Patient presents with  . New Evaluation    eval symptomatic rt groin hernia - referral from Dr. Rodrigo Ran    HISTORY: Patient is a 72 year old white male referred by his primary care physician for evaluation of symptomatic right inguinal hernia. Patient was first diagnosed with right inguinal hernia approximately 5-6 years ago. It has gradually increased in size. Over the past 3 weeks he has had some intermittent discomfort. He has noted slight irregularity with his bowel movements. He was seen yesterday by his primary care physician and referred to our office for surgical evaluation and recommendations.  Patient has a past history of left inguinal hernia repair in 1960. He has had no signs of recurrence. He has had no other intra-abdominal surgery.  Past Medical History  Diagnosis Date  . Bladder cancer     Surgery  . DJD (degenerative joint disease)   . Glaucoma   . Hypercholesterolemia   . HTN (hypertension)      Current Outpatient Prescriptions  Medication Sig Dispense Refill  . aspirin 81 MG tablet Take 81 mg by mouth daily.      . Cholecalciferol (VITAMIN D-3 PO) Take 1,000 Units by mouth daily.      . Ibuprofen (ADVIL PO) Take by mouth as needed.      . latanoprost (XALATAN) 0.005 % ophthalmic solution Place 1 drop into both eyes at bedtime.      Marland Kitchen lisinopril (PRINIVIL,ZESTRIL) 10 MG tablet Take 10 mg by mouth daily.      . Multiple Vitamins-Minerals (MULTIVITAMIN PO) Take by mouth daily.      . Omega-3 Fatty Acids (FISH OIL) 1200 MG CAPS Take by mouth. Taking 2 Tablets Daily      . rosuvastatin (CRESTOR) 5 MG tablet Take 5 mg by mouth daily.       No current facility-administered medications for this visit.     No Known Allergies   Family History  Problem Relation Age of Onset  . Cancer Father     lung     History   Social History  . Marital Status: Married    Spouse Name: N/A    Number  of Children: 2  . Years of Education: N/A   Occupational History  . corporate Investment banker, [employer]  . Theme park manager    Social History Main Topics  . Smoking status: Current Every Day Smoker -- 1.00 packs/day  . Smokeless tobacco: None  . Alcohol Use: None  . Drug Use: None  . Sexually Active: None   Other Topics Concern  . None   Social History Narrative  . None     REVIEW OF SYSTEMS - PERTINENT POSITIVES ONLY: Intermittent pain right groin. Change in bowel habits past 3 weeks.  EXAM: Filed Vitals:   07/23/12 0950  BP: 134/82  Pulse: 60  Temp: 97.9 F (36.6 C)  Resp: 16    HEENT: normocephalic; pupils equal and reactive; sclerae clear; dentition good; mucous membranes moist NECK:  No palpable masses in the thyroid bed; symmetric on extension; no palpable anterior or posterior cervical lymphadenopathy; no supraclavicular masses; no tenderness CHEST: clear to auscultation bilaterally without rales, rhonchi, or wheezes CARDIAC: regular rate and rhythm without significant murmur; peripheral pulses are full ABDOMEN: soft without distension; bowel sounds present; no mass; no hepatosplenomegaly GU:  Normal male without mass or lesion; well-healed incision left groin; palpation in the left inguinal canal with  cough and Valsalva shows no sign of recurrence; obvious bulge in the right groin; palpation in the inguinal canal shows an obvious direct inguinal hernia which is not fully reducible and minimally tender; right testicle without mass or tenderness EXT:  non-tender without edema; no deformity NEURO: no gross focal deficits; no sign of tremor   LABORATORY RESULTS: See [facility] (CHL-Epic) for most recent results   RADIOLOGY RESULTS: See [facility] (CHL-Epic) for most recent results   IMPRESSION: Right inguinal hernia, symptomatic  PLAN: The patient and I had a lengthy discussion regarding management of inguinal hernia. I provided him  with written literature to review. We discussed the options for management and I have recommended an open right inguinal hernia repair with mesh. We discussed potential complications and the potential for recurrence. We discussed restrictions on his activities following the procedure. We will make arrangements for surgery as an outpatient at [facility] in the near future.  The risks and benefits of the procedure have been discussed at length with the patient.  The patient understands the proposed procedure, potential alternative treatments, and the course of recovery to be expected.  All of the patient's questions have been answered at this time.  The patient wishes to proceed with surgery.  Velora Heckler, MD, FACS General & Endocrine Surgery [facility]   Visit Diagnoses: 1. Inguinal hernia unilateral, non-recurrent     Primary Care Physician: Ezequiel Kayser, MD

## 2012-07-24 ENCOUNTER — Encounter (HOSPITAL_COMMUNITY): Payer: Self-pay | Admitting: Pharmacy Technician

## 2012-07-24 ENCOUNTER — Other Ambulatory Visit (HOSPITAL_COMMUNITY): Payer: Self-pay | Admitting: Surgery

## 2012-07-24 DIAGNOSIS — IMO0002 Reserved for concepts with insufficient information to code with codable children: Secondary | ICD-10-CM | POA: Diagnosis not present

## 2012-07-24 DIAGNOSIS — M999 Biomechanical lesion, unspecified: Secondary | ICD-10-CM | POA: Diagnosis not present

## 2012-07-24 DIAGNOSIS — M5137 Other intervertebral disc degeneration, lumbosacral region: Secondary | ICD-10-CM | POA: Diagnosis not present

## 2012-07-24 DIAGNOSIS — M25559 Pain in unspecified hip: Secondary | ICD-10-CM | POA: Diagnosis not present

## 2012-07-24 NOTE — Patient Instructions (Addendum)
20 QUAVIS KLUTZ  07/24/2012   Your procedure is scheduled on: 08-01-2012  Report to [facility] at 0700 AM.  Call this number if you have problems the morning of surgery [phone]   Remember:   Do not eat food or drink liquids :After Midnight.     Do not wear jewelry, make-up or nail polish.  Do not wear lotions, powders, or perfumes. You may wear deodorant.   Men may shave face and neck.  Do not bring valuables to the hospital.  Contacts, dentures or bridgework may not be worn into surgery.     Patients discharged the day of surgery will not be allowed to drive home.  Name and phone number of your driver: Brantley Fling [phone]   Please read over the following fact sheets that you were given: MRSA Information.  Call Birdie Sons RN pre op nurse if needed [phone]    FAILURE TO FOLLOW THESE INSTRUCTIONS MAY RESULT IN THE CANCELLATION OF YOUR SURGERY. PATIENT SIGNATURE___________________________________________

## 2012-07-25 ENCOUNTER — Encounter (HOSPITAL_COMMUNITY): Payer: Self-pay

## 2012-07-25 ENCOUNTER — Ambulatory Visit (HOSPITAL_COMMUNITY)
Admission: RE | Admit: 2012-07-25 | Discharge: 2012-07-25 | Disposition: A | Discharge status: Home / Self Care | Payer: Medicare Other | Source: Ambulatory Visit | Attending: Surgery | Admitting: Surgery

## 2012-07-25 ENCOUNTER — Encounter (HOSPITAL_COMMUNITY)
Admission: RE | Admit: 2012-07-25 | Discharge: 2012-07-25 | Disposition: A | Discharge status: Home / Self Care | Payer: Medicare Other | Source: Ambulatory Visit | Attending: Surgery | Admitting: Surgery

## 2012-07-25 DIAGNOSIS — I10 Essential (primary) hypertension: Secondary | ICD-10-CM | POA: Diagnosis not present

## 2012-07-25 DIAGNOSIS — Z01818 Encounter for other preprocedural examination: Secondary | ICD-10-CM | POA: Insufficient documentation

## 2012-07-25 DIAGNOSIS — K4090 Unilateral inguinal hernia, without obstruction or gangrene, not specified as recurrent: Secondary | ICD-10-CM | POA: Diagnosis not present

## 2012-07-25 DIAGNOSIS — Z01812 Encounter for preprocedural laboratory examination: Secondary | ICD-10-CM | POA: Insufficient documentation

## 2012-07-25 HISTORY — DX: Congenital malformation of kidney, unspecified: Q63.9

## 2012-07-25 HISTORY — DX: Adverse effect of unspecified anesthetic, initial encounter: T41.45XA

## 2012-07-25 LAB — BASIC METABOLIC PANEL
BUN: 21 mg/dL (ref 6–23)
Calcium: 9.5 mg/dL (ref 8.4–10.5)
Creatinine, Ser: 0.81 mg/dL (ref 0.50–1.35)
GFR calc Af Amer: >90 mL/min (ref >90)
GFR calc non Af Amer: 89 mL/min — ABNORMAL LOW (ref >90)
Glucose, Bld: 100 mg/dL — ABNORMAL HIGH (ref 70–99)
Potassium: 4.5 mEq/L (ref 3.5–5.1)

## 2012-07-25 LAB — SURGICAL PCR SCREEN
MRSA, PCR: NEGATIVE
Staphylococcus aureus: NEGATIVE

## 2012-07-25 LAB — CBC
Hemoglobin: 16.9 g/dL (ref 13.0–17.0)
MCH: 33.3 pg (ref 26.0–34.0)
MCHC: 34.3 g/dL (ref 30.0–36.0)
Platelets: 191 10*3/uL (ref 150–400)
RDW: 12.5 % (ref 11.5–15.5)

## 2012-07-25 NOTE — Progress Notes (Addendum)
EKG 6/13 EPIC, stress test 09/20/11 on EPIC

## 2012-07-25 NOTE — Progress Notes (Signed)
07/25/12 1109  OBSTRUCTIVE SLEEP APNEA  Have you ever been diagnosed with sleep apnea through a sleep study? No  Do you snore loudly (loud enough to be heard through closed doors)?  0  Do you often feel tired, fatigued, or sleepy during the daytime? 0  Has anyone observed you stop breathing during your sleep? 0  Do you have, or are you being treated for high blood pressure? 1  BMI more than 35 kg/m2? 1  Age over 70 years old? 1  Neck circumference greater than 40 cm/18 inches? 0  Gender: 1  Obstructive Sleep Apnea Score 4  Score 4 or greater  Results sent to PCP

## 2012-07-29 DIAGNOSIS — IMO0002 Reserved for concepts with insufficient information to code with codable children: Secondary | ICD-10-CM | POA: Diagnosis not present

## 2012-07-29 DIAGNOSIS — M999 Biomechanical lesion, unspecified: Secondary | ICD-10-CM | POA: Diagnosis not present

## 2012-07-29 DIAGNOSIS — M5137 Other intervertebral disc degeneration, lumbosacral region: Secondary | ICD-10-CM | POA: Diagnosis not present

## 2012-07-29 DIAGNOSIS — M25559 Pain in unspecified hip: Secondary | ICD-10-CM | POA: Diagnosis not present

## 2012-08-01 ENCOUNTER — Ambulatory Visit (HOSPITAL_COMMUNITY)
Admission: RE | Admit: 2012-08-01 | Discharge: 2012-08-01 | Disposition: A | Discharge status: Home / Self Care | Payer: Medicare Other | Source: Ambulatory Visit | Attending: Surgery | Admitting: Surgery

## 2012-08-01 ENCOUNTER — Encounter (HOSPITAL_COMMUNITY)
Admission: RE | Disposition: A | Discharge status: Home / Self Care | Payer: Self-pay | Source: Ambulatory Visit | Attending: Surgery

## 2012-08-01 ENCOUNTER — Encounter (HOSPITAL_COMMUNITY): Payer: Self-pay | Admitting: Anesthesiology

## 2012-08-01 ENCOUNTER — Ambulatory Visit (HOSPITAL_COMMUNITY): Payer: Medicare Other | Admitting: Anesthesiology

## 2012-08-01 ENCOUNTER — Encounter (HOSPITAL_COMMUNITY): Payer: Self-pay | Admitting: *Deleted

## 2012-08-01 DIAGNOSIS — I10 Essential (primary) hypertension: Secondary | ICD-10-CM | POA: Diagnosis not present

## 2012-08-01 DIAGNOSIS — Z7982 Long term (current) use of aspirin: Secondary | ICD-10-CM | POA: Diagnosis not present

## 2012-08-01 DIAGNOSIS — Z79899 Other long term (current) drug therapy: Secondary | ICD-10-CM | POA: Diagnosis not present

## 2012-08-01 DIAGNOSIS — Q649 Congenital malformation of urinary system, unspecified: Secondary | ICD-10-CM | POA: Diagnosis not present

## 2012-08-01 DIAGNOSIS — E669 Obesity, unspecified: Secondary | ICD-10-CM | POA: Insufficient documentation

## 2012-08-01 DIAGNOSIS — K4090 Unilateral inguinal hernia, without obstruction or gangrene, not specified as recurrent: Secondary | ICD-10-CM | POA: Diagnosis not present

## 2012-08-01 DIAGNOSIS — M1990 Unspecified osteoarthritis, unspecified site: Secondary | ICD-10-CM | POA: Diagnosis not present

## 2012-08-01 DIAGNOSIS — H409 Unspecified glaucoma: Secondary | ICD-10-CM | POA: Diagnosis not present

## 2012-08-01 DIAGNOSIS — E7800 Pure hypercholesterolemia, unspecified: Secondary | ICD-10-CM | POA: Insufficient documentation

## 2012-08-01 DIAGNOSIS — K4030 Unilateral inguinal hernia, with obstruction, without gangrene, not specified as recurrent: Secondary | ICD-10-CM

## 2012-08-01 DIAGNOSIS — F17200 Nicotine dependence, unspecified, uncomplicated: Secondary | ICD-10-CM | POA: Diagnosis not present

## 2012-08-01 DIAGNOSIS — Z8551 Personal history of malignant neoplasm of bladder: Secondary | ICD-10-CM | POA: Diagnosis not present

## 2012-08-01 HISTORY — PX: INGUINAL HERNIA REPAIR: SHX194

## 2012-08-01 HISTORY — PX: INSERTION OF MESH: SHX5868

## 2012-08-01 SURGERY — REPAIR, HERNIA, INGUINAL, ADULT
Anesthesia: General | Site: Abdomen | Laterality: Right | Wound class: Clean

## 2012-08-01 MED ORDER — CEFAZOLIN SODIUM-DEXTROSE 2-3 GM-% IV SOLR
INTRAVENOUS | Status: AC
  Filled 2012-08-01: qty 50

## 2012-08-01 MED ORDER — FENTANYL CITRATE 0.05 MG/ML IJ SOLN
INTRAMUSCULAR | Status: DC | PRN
  Administered 2012-08-01 (×3): 50 ug via INTRAVENOUS

## 2012-08-01 MED ORDER — HYDROCODONE-ACETAMINOPHEN 5-325 MG PO TABS
1.0000 | ORAL_TABLET | ORAL | Status: DC | PRN
Start: 2012-08-01 — End: 2012-08-05

## 2012-08-01 MED ORDER — SUCCINYLCHOLINE CHLORIDE 20 MG/ML IJ SOLN
INTRAMUSCULAR | Status: DC | PRN
  Administered 2012-08-01: 140 mg via INTRAVENOUS

## 2012-08-01 MED ORDER — 0.9 % SODIUM CHLORIDE (POUR BTL) OPTIME
TOPICAL | Status: DC | PRN
  Administered 2012-08-01: 1000 mL

## 2012-08-01 MED ORDER — LIDOCAINE HCL (CARDIAC) 20 MG/ML IV SOLN
INTRAVENOUS | Status: DC | PRN
  Administered 2012-08-01: 60 mg via INTRAVENOUS

## 2012-08-01 MED ORDER — GLYCOPYRROLATE 0.2 MG/ML IJ SOLN
INTRAMUSCULAR | Status: DC | PRN
  Administered 2012-08-01: 0.2 mg via INTRAVENOUS
  Administered 2012-08-01: .7 mg via INTRAVENOUS

## 2012-08-01 MED ORDER — ACETAMINOPHEN 10 MG/ML IV SOLN
INTRAVENOUS | Status: DC | PRN
  Administered 2012-08-01: 1000 mg via INTRAVENOUS

## 2012-08-01 MED ORDER — PROMETHAZINE HCL 25 MG/ML IJ SOLN: 6.2500 mg | INTRAMUSCULAR | Status: DC | PRN

## 2012-08-01 MED ORDER — DEXTROSE 5 % IV SOLN
3.0000 g | INTRAVENOUS | Status: AC
  Administered 2012-08-01: 3 g via INTRAVENOUS

## 2012-08-01 MED ORDER — MIDAZOLAM HCL 5 MG/5ML IJ SOLN
INTRAMUSCULAR | Status: DC | PRN
  Administered 2012-08-01: 2 mg via INTRAVENOUS

## 2012-08-01 MED ORDER — OXYCODONE HCL 5 MG PO TABS
5.0000 mg | ORAL_TABLET | Freq: Once | ORAL | Status: AC
  Administered 2012-08-01: 5 mg via ORAL
  Filled 2012-08-01: qty 1

## 2012-08-01 MED ORDER — ONDANSETRON HCL 4 MG/2ML IJ SOLN
INTRAMUSCULAR | Status: DC | PRN
  Administered 2012-08-01: 4 mg via INTRAVENOUS

## 2012-08-01 MED ORDER — PROPOFOL 10 MG/ML IV BOLUS
INTRAVENOUS | Status: DC | PRN
  Administered 2012-08-01: 150 mg via INTRAVENOUS

## 2012-08-01 MED ORDER — ROCURONIUM BROMIDE 100 MG/10ML IV SOLN
INTRAVENOUS | Status: DC | PRN
  Administered 2012-08-01: 35 mg via INTRAVENOUS
  Administered 2012-08-01 (×2): 5 mg via INTRAVENOUS

## 2012-08-01 MED ORDER — CEFAZOLIN SODIUM 1-5 GM-% IV SOLN
INTRAVENOUS | Status: AC
  Filled 2012-08-01: qty 50

## 2012-08-01 MED ORDER — ACETAMINOPHEN 10 MG/ML IV SOLN
INTRAVENOUS | Status: AC
  Filled 2012-08-01: qty 100

## 2012-08-01 MED ORDER — HYDROMORPHONE HCL PF 1 MG/ML IJ SOLN
INTRAMUSCULAR | Status: AC
  Filled 2012-08-01: qty 1

## 2012-08-01 MED ORDER — NEOSTIGMINE METHYLSULFATE 1 MG/ML IJ SOLN
INTRAMUSCULAR | Status: DC | PRN
  Administered 2012-08-01: 5 mg via INTRAVENOUS

## 2012-08-01 MED ORDER — HYDROMORPHONE HCL PF 1 MG/ML IJ SOLN
0.2500 mg | INTRAMUSCULAR | Status: DC | PRN
  Administered 2012-08-01 (×2): 0.5 mg via INTRAVENOUS

## 2012-08-01 MED ORDER — LACTATED RINGERS IV SOLN
INTRAVENOUS | Status: DC
  Administered 2012-08-01: 1000 mL via INTRAVENOUS

## 2012-08-01 MED ORDER — BUPIVACAINE HCL (PF) 0.5 % IJ SOLN
INTRAMUSCULAR | Status: DC | PRN
  Administered 2012-08-01: 20 mL

## 2012-08-01 MED ORDER — BUPIVACAINE HCL 0.5 % IJ SOLN
INTRAMUSCULAR | Status: AC
  Filled 2012-08-01: qty 1

## 2012-08-01 MED ORDER — EPHEDRINE SULFATE 50 MG/ML IJ SOLN
INTRAMUSCULAR | Status: DC | PRN
  Administered 2012-08-01: 10 mg via INTRAVENOUS

## 2012-08-01 MED ORDER — LACTATED RINGERS IV SOLN
INTRAVENOUS | Status: DC | PRN
  Administered 2012-08-01 (×2): via INTRAVENOUS

## 2012-08-01 SURGICAL SUPPLY — 37 items
APPLICATOR COTTON TIP 6IN STRL (MISCELLANEOUS) ×2 IMPLANT
BENZOIN TINCTURE PRP APPL 2/3 (GAUZE/BANDAGES/DRESSINGS) ×2 IMPLANT
BLADE HEX COATED 2.75 (ELECTRODE) ×2 IMPLANT
BLADE SURG 15 STRL LF DISP TIS (BLADE) ×1 IMPLANT
BLADE SURG 15 STRL SS (BLADE) ×1
BLADE SURG SZ10 CARB STEEL (BLADE) IMPLANT
CANISTER SUCTION 2500CC (MISCELLANEOUS) ×2 IMPLANT
CLOSURE STERI-STRIP 1/4X4 (GAUZE/BANDAGES/DRESSINGS) ×2 IMPLANT
CLOTH BEACON ORANGE TIMEOUT ST (SAFETY) ×2 IMPLANT
DECANTER SPIKE VIAL GLASS SM (MISCELLANEOUS) ×2 IMPLANT
DRAIN PENROSE 18X1/2 LTX STRL (DRAIN) ×2 IMPLANT
DRAPE LAPAROTOMY TRNSV 102X78 (DRAPE) ×2 IMPLANT
ELECT REM PT RETURN 9FT ADLT (ELECTROSURGICAL) ×2
ELECTRODE REM PT RTRN 9FT ADLT (ELECTROSURGICAL) ×1 IMPLANT
GLOVE BIOGEL PI IND STRL 7.0 (GLOVE) ×1 IMPLANT
GLOVE BIOGEL PI INDICATOR 7.0 (GLOVE) ×1
GLOVE SURG ORTHO 8.0 STRL STRW (GLOVE) ×2 IMPLANT
GOWN STRL NON-REIN LRG LVL3 (GOWN DISPOSABLE) ×2 IMPLANT
GOWN STRL REIN XL XLG (GOWN DISPOSABLE) ×4 IMPLANT
KIT BASIN OR (CUSTOM PROCEDURE TRAY) ×2 IMPLANT
MESH ULTRAPRO 3X6 7.6X15CM (Mesh General) ×2 IMPLANT
NEEDLE HYPO 25X1 1.5 SAFETY (NEEDLE) ×2 IMPLANT
PACK BASIC VI WITH GOWN DISP (CUSTOM PROCEDURE TRAY) ×2 IMPLANT
PENCIL BUTTON HOLSTER BLD 10FT (ELECTRODE) ×2 IMPLANT
SPONGE GAUZE 4X4 12PLY (GAUZE/BANDAGES/DRESSINGS) ×2 IMPLANT
SPONGE LAP 4X18 X RAY DECT (DISPOSABLE) ×6 IMPLANT
STRIP CLOSURE SKIN 1/2X4 (GAUZE/BANDAGES/DRESSINGS) ×2 IMPLANT
SUT MNCRL AB 4-0 PS2 18 (SUTURE) ×2 IMPLANT
SUT NOVA NAB GS-21 0 18 T12 DT (SUTURE) ×2 IMPLANT
SUT NOVA NAB GS-22 2 0 T19 (SUTURE) ×4 IMPLANT
SUT SILK 2 0 SH (SUTURE) ×2 IMPLANT
SUT VIC AB 3-0 SH 18 (SUTURE) ×2 IMPLANT
SYR BULB IRRIGATION 50ML (SYRINGE) ×2 IMPLANT
SYR CONTROL 10ML LL (SYRINGE) ×2 IMPLANT
TAPE CLOTH SURG 4X10 WHT LF (GAUZE/BANDAGES/DRESSINGS) ×2 IMPLANT
TOWEL OR 17X26 10 PK STRL BLUE (TOWEL DISPOSABLE) ×2 IMPLANT
YANKAUER SUCT BULB TIP 10FT TU (MISCELLANEOUS) ×2 IMPLANT

## 2012-08-01 NOTE — Interval H&P Note (Signed)
History and Physical Interval Note:  08/01/2012 8:58 AM  Justin Atkinson  has presented today for surgery, with the diagnosis of hernia.  The various methods of treatment have been discussed with the patient and family. After consideration of risks, benefits and other options for treatment, the patient has consented to    Procedure(s): HERNIA REPAIR INGUINAL ADULT (Right) INSERTION OF MESH (Right) as a surgical intervention .    The patient's history has been reviewed, patient examined, no change in status, stable for surgery.  I have reviewed the patient's chart and labs.  Questions were answered to the patient's satisfaction.    Velora Heckler, MD, [facility] Office: [phone]    Iasia Forcier Judie Petit

## 2012-08-01 NOTE — Transfer of Care (Signed)
Immediate Anesthesia Transfer of Care Note  Patient: Justin Atkinson  Procedure(s) Performed: Procedure(s): HERNIA REPAIR INGUINAL ADULT (Right) INSERTION OF MESH (Right)  Patient Location: PACU  Anesthesia Type:General  Level of Consciousness: awake, alert , oriented and patient cooperative  Airway & Oxygen Therapy: Patient Spontanous Breathing and Patient connected to face mask oxygen  Post-op Assessment: Report given to PACU RN, Post -op Vital signs reviewed and stable and Patient moving all extremities X 4  Post vital signs: Reviewed and stable  Complications: No apparent anesthesia complications

## 2012-08-01 NOTE — Anesthesia Preprocedure Evaluation (Addendum)
Anesthesia Evaluation  Patient identified by MRN, date of birth, ID band Patient awake  General Assessment Comment:Woke up during colonoscopy. Glaucoma, HTN, kidney anomaly.  Reviewed: Allergy & Precautions, H&P , NPO status , Patient's Chart, lab work & pertinent test results  History of Anesthesia Complications (+) AWARENESS UNDER ANESTHESIA  Airway Mallampati: II TM Distance: >3 FB Neck ROM: Full    Dental no notable dental hx.    Pulmonary Current Smoker,  breath sounds clear to auscultation  Pulmonary exam normal       Cardiovascular Exercise Tolerance: Good hypertension, Pt. on medications Rhythm:Regular Rate:Normal  Most recent ECG and CXR reviewed.  Cardiology visit (Dr. [place]) 09-03-11 reviewed.  Normal lexiscan myoview on 09-18-11. EF 54%   Neuro/Psych Glaucoma negative psych ROS   GI/Hepatic negative GI ROS, Neg liver ROS,   Endo/Other  negative endocrine ROS  Renal/GU Renal diseaseCongenital kidney anomaly. Bladder cancer.  Single kidney.  negative genitourinary   Musculoskeletal negative musculoskeletal ROS (+)   Abdominal (+) + obese,   Peds negative pediatric ROS (+)  Hematology negative hematology ROS (+)   Anesthesia Other Findings   Reproductive/Obstetrics negative OB ROS                      Anesthesia Physical Anesthesia Plan  ASA: III  Anesthesia Plan: General   Post-op Pain Management:    Induction: Intravenous  Airway Management Planned: Oral ETT  Additional Equipment:   Intra-op Plan:   Post-operative Plan: Extubation in OR  Informed Consent: I have reviewed the patients History and Physical, chart, labs and discussed the procedure including the risks, benefits and alternatives for the proposed anesthesia with the patient or authorized representative who has indicated his/her understanding and acceptance.   Dental advisory given  Plan Discussed with:  CRNA  Anesthesia Plan Comments:         Anesthesia Quick Evaluation

## 2012-08-01 NOTE — Anesthesia Postprocedure Evaluation (Signed)
  Anesthesia Post-op Note  Patient: Justin Atkinson  Procedure(s) Performed: Procedure(s) (LRB): HERNIA REPAIR INGUINAL ADULT (Right) INSERTION OF MESH (Right)  Patient Location: PACU  Anesthesia Type: General  Level of Consciousness: awake and alert   Airway and Oxygen Therapy: Patient Spontanous Breathing  Post-op Pain: mild  Post-op Assessment: Post-op Vital signs reviewed, Patient's Cardiovascular Status Stable, Respiratory Function Stable, Patent Airway and No signs of Nausea or vomiting  Last Vitals:  Filed Vitals:   08/01/12 1230  BP: 104/70  Pulse: 52  Temp: 36.4 C  Resp:     Post-op Vital Signs: stable   Complications: No apparent anesthesia complications. OSA precautions.

## 2012-08-01 NOTE — H&P (View-Only) (Signed)
[facility]  Chief Complaint  Patient presents with  . New Evaluation    eval symptomatic rt groin hernia - referral from Dr. Mark Perini    HISTORY: Patient is a 72-year-old white male referred by his primary care physician for evaluation of symptomatic right inguinal hernia. Patient was first diagnosed with right inguinal hernia approximately 5-6 years ago. It has gradually increased in size. Over the past 3 weeks he has had some intermittent discomfort. He has noted slight irregularity with his bowel movements. He was seen yesterday by his primary care physician and referred to our office for surgical evaluation and recommendations.  Patient has a past history of left inguinal hernia repair in 1960. He has had no signs of recurrence. He has had no other intra-abdominal surgery.  Past Medical History  Diagnosis Date  . Bladder cancer     Surgery  . DJD (degenerative joint disease)   . Glaucoma   . Hypercholesterolemia   . HTN (hypertension)      Current Outpatient Prescriptions  Medication Sig Dispense Refill  . aspirin 81 MG tablet Take 81 mg by mouth daily.      . Cholecalciferol (VITAMIN D-3 PO) Take 1,000 Units by mouth daily.      . Ibuprofen (ADVIL PO) Take by mouth as needed.      . latanoprost (XALATAN) 0.005 % ophthalmic solution Place 1 drop into both eyes at bedtime.      . lisinopril (PRINIVIL,ZESTRIL) 10 MG tablet Take 10 mg by mouth daily.      . Multiple Vitamins-Minerals (MULTIVITAMIN PO) Take by mouth daily.      . Omega-3 Fatty Acids (FISH OIL) 1200 MG CAPS Take by mouth. Taking 2 Tablets Daily      . rosuvastatin (CRESTOR) 5 MG tablet Take 5 mg by mouth daily.       No current facility-administered medications for this visit.     No Known Allergies   Family History  Problem Relation Age of Onset  . Cancer Father     lung     History   Social History  . Marital Status: Married    Spouse Name: N/A    Number  of Children: 2  . Years of Education: N/A   Occupational History  . 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 Smoking status: Current Every Day Smoker -- 1.00 packs/day  . Smokeless tobacco: None  . Alcohol Use: None  . Drug Use: None  . Sexually Active: None   Other Topics Concern  . None   Social History Narrative  . None     REVIEW OF SYSTEMS - PERTINENT POSITIVES ONLY: Intermittent pain right groin. Change in bowel habits past 3 weeks.  EXAM: Filed Vitals:   07/23/12 0950  BP: 134/82  Pulse: 60  Temp: 97.9 F (36.6 C)  Resp: 16    HEENT: normocephalic; pupils equal and reactive; sclerae clear; dentition good; mucous membranes moist NECK:  No palpable masses in the thyroid bed; symmetric on extension; no palpable anterior or posterior cervical lymphadenopathy; no supraclavicular masses; no tenderness CHEST: clear to auscultation bilaterally without rales, rhonchi, or wheezes CARDIAC: regular rate and rhythm without significant murmur; peripheral pulses are full ABDOMEN: soft without distension; bowel sounds present; no mass; no hepatosplenomegaly GU:  Normal male without mass or lesion; well-healed incision left groin; palpation in the left inguinal canal with   cough and Valsalva shows no sign of recurrence; obvious bulge in the right groin; palpation in the inguinal canal shows an obvious direct inguinal hernia which is not fully reducible and minimally tender; right testicle without mass or tenderness EXT:  non-tender without edema; no deformity NEURO: no gross focal deficits; no sign of tremor   LABORATORY RESULTS: See [facility] (CHL-Epic) for most recent results   RADIOLOGY RESULTS: See [facility] (CHL-Epic) for most recent results   IMPRESSION: Right inguinal hernia, symptomatic  PLAN: The patient and I had a lengthy discussion regarding management of inguinal hernia. I provided him  with written literature to review. We discussed the options for management and I have recommended an open right inguinal hernia repair with mesh. We discussed potential complications and the potential for recurrence. We discussed restrictions on his activities following the procedure. We will make arrangements for surgery as an outpatient at [facility] in the near future.  The risks and benefits of the procedure have been discussed at length with the patient.  The patient understands the proposed procedure, potential alternative treatments, and the course of recovery to be expected.  All of the patient's questions have been answered at this time.  The patient wishes to proceed with surgery.  Zakkary Thibault M. Jemia Fata, MD, FACS General & Endocrine Surgery [facility]   Visit Diagnoses: 1. Inguinal hernia unilateral, non-recurrent     Primary Care Physician: PERINI,MARK A, MD   

## 2012-08-01 NOTE — Op Note (Signed)
Inguinal Hernia, Open, Procedure Note  Pre-operative Diagnosis:  Right inguinal hernia, incarcerated   Post-operative Diagnosis: same  Surgeon:  Velora Heckler, MD, FACS  Anesthesia:  General  Indications: The patient presented with a right, not reducible inguinal hernia.    Procedure Details  The patient was seen again in the Holding Room. The risks, benefits, complications, treatment options, and expected outcomes were discussed with the patient.  There was concurrence with the proposed plan, and informed consent was obtained. The site of surgery was properly noted/marked. The patient was taken to the Operating Room, identified by name, and the procedure verified as hernia repair. A Time Out was held and the above information confirmed.  The patient was placed in the supine position and underwent induction of anesthesia.  The lower abdomen and groin was prepped and draped in the usual strict aseptic fashion.  After ascertaining that an adequate level of anesthesia had been obtained, and incision is made in the groin with a #10 blade.  Dissection is carried through the subcutaneous tissues and hemostasis obtained with the electrocautery.  A Gelpi retractor is placed for exposure.  The external oblique fascia is incised in line with it's fibers and extended through the external inguinal ring.  The cord structures are dissected out of the inguinal canal and encircled with a Penrose drain.  The floor of the inguinal canal is dissected out.  The cord is explored and a large direct inguinal hernia sac is dissected away from the cord structures.  It contains bowel.  It is reduced and the defect closed with interrupted 0-Novofil.  The floor of the inguinal canal is reconstructed with a sheet of mesh cut to the appropriate dimensions.  It is secured to the pubic tubercle with a 2-0 Novafil suture and along the inguinal ligament with a running 2-0 Novafil suture.  Mesh is split to accommodate the cord  structures.  The superior edge of the mesh is secured to the transversalis and internal oblique muscles with interrupted 2-0 Novafil sutures.  The tails of the mesh are overlapped lateral to the cord structures and secured to the inguinal ligament with interrupted 2-0 Novafil sutures to recreate the internal inguinal ring.  Cord structures are returned to the inguinal canal.  Local anesthetic is infiltrated throughout the field.  External oblique fascia is closed with interrupted 3-0 Vicryl sutures.  Subcutaneous tissues are closed with interrupted 3-0 Vicryl sutures.  Skin is anesthetized with local anesthetic, and the skin edges re-approximated with a running 4-0 Monocryl suture.  Wound is washed and dried and benzoin and steristrips are applied.  A gauze dressing is then applied.  Instrument, sponge, and needle counts were correct prior to closure and at the conclusion of the case.  Velora Heckler, MD, FACS General & Endocrine Surgery [facility]   Findings: Hernia as above  Estimated Blood Loss: Minimal         Specimens: None  Complications: None; patient tolerated the procedure well.         Disposition: PACU - hemodynamically stable.         Condition: stable

## 2012-08-01 NOTE — Preoperative (Signed)
Beta Blockers   Reason not to administer Beta Blockers:Not Applicable 

## 2012-08-01 NOTE — Progress Notes (Signed)
Patient stated he voided small amt. Bladder scan performed showing only 20ml in bladder. Instructed if he has any problems voiding after he gets home to contact his doctor. Patient voided again just before discharge.

## 2012-08-05 ENCOUNTER — Telehealth (INDEPENDENT_AMBULATORY_CARE_PROVIDER_SITE_OTHER): Payer: Self-pay

## 2012-08-05 DIAGNOSIS — Z8719 Personal history of other diseases of the digestive system: Secondary | ICD-10-CM

## 2012-08-05 MED ORDER — HYDROCODONE-ACETAMINOPHEN 5-325 MG PO TABS: 1.0000 | ORAL_TABLET | ORAL | Status: DC | PRN

## 2012-08-05 NOTE — Telephone Encounter (Signed)
Pt calling asking about a refill on his Hydrocodone 5/325mg . I stated that we could do the automatic refill on the pt rx and he requested it be called into [facility] to be delivered to pt's home. The pt was asking about the swelling still after surgery since he has  Been using ice still. I advised pt to alternate heating pad and ice. The pt was asking about his bowels not moving yet and he has been taking Miralax. I advised pt he needed to use Milk of Magnesia. The pt will call our office in 24 hrs if no BM after trying the milk of magnesia.

## 2012-08-06 ENCOUNTER — Encounter (HOSPITAL_COMMUNITY): Payer: Self-pay | Admitting: Surgery

## 2012-08-20 ENCOUNTER — Encounter (INDEPENDENT_AMBULATORY_CARE_PROVIDER_SITE_OTHER): Payer: Self-pay | Admitting: Surgery

## 2012-08-20 ENCOUNTER — Ambulatory Visit (INDEPENDENT_AMBULATORY_CARE_PROVIDER_SITE_OTHER): Payer: Medicare Other | Admitting: Surgery

## 2012-08-20 VITALS — BP 112/70 | HR 60 | Temp 97.6°F | Resp 16 | Ht 74.0 in | Wt 277.0 lb

## 2012-08-20 DIAGNOSIS — K4090 Unilateral inguinal hernia, without obstruction or gangrene, not specified as recurrent: Secondary | ICD-10-CM

## 2012-08-20 NOTE — Progress Notes (Signed)
General Surgery [facility]  Visit Diagnoses: 1. Inguinal hernia unilateral, non-recurrent     HISTORY: Patient underwent right inguinal hernia repair with mesh on May 2. He returns for her first postoperative visit. Yesterday he experienced drainage from the incision. He brings with him his undergarment which shows some serous drainage.  EXAM: Surgical wound has healed. Steri-Strips are then removed. Palpation shows a moderate sized seroma in the deep subcutaneous tissue. No sign of infection. Palpation in the inguinal canal with cough and Valsalva shows no sign of recurrent hernia.  IMPRESSION: Status post right inguinal hernia repair with mesh  PLAN: Patient underwent aspiration of the seroma today in the office. I obtained 60 cc of serous fluid. This was discarded. Dry gauze dressing was placed.  Usual instructions for wound care given. Patient will begin applying topical creams to the incision. He will return in 6 weeks for a final wound check. If he has further drainage or other complications he will contact the office immediately.  Velora Heckler, MD, FACS General & Endocrine Surgery [facility]

## 2012-08-20 NOTE — Patient Instructions (Signed)
  COCOA BUTTER & VITAMIN E CREAM  ([employer]mer[employer] or other brand)  Apply cocoa butter/vitamin E cream to your incision 2 - 3 times daily.  Massage cream into incision for one minute with each application.  Use sunscreen (50 SPF or higher) for first 6 months after surgery if area is exposed to sun.  You may substitute Mederma or other scar reducing creams as desired.   

## 2012-09-01 DIAGNOSIS — L819 Disorder of pigmentation, unspecified: Secondary | ICD-10-CM | POA: Diagnosis not present

## 2012-09-01 DIAGNOSIS — L909 Atrophic disorder of skin, unspecified: Secondary | ICD-10-CM | POA: Diagnosis not present

## 2012-09-01 DIAGNOSIS — D485 Neoplasm of uncertain behavior of skin: Secondary | ICD-10-CM | POA: Diagnosis not present

## 2012-09-01 DIAGNOSIS — L821 Other seborrheic keratosis: Secondary | ICD-10-CM | POA: Diagnosis not present

## 2012-09-01 DIAGNOSIS — L919 Hypertrophic disorder of the skin, unspecified: Secondary | ICD-10-CM | POA: Diagnosis not present

## 2012-09-01 DIAGNOSIS — D239 Other benign neoplasm of skin, unspecified: Secondary | ICD-10-CM | POA: Diagnosis not present

## 2012-09-01 DIAGNOSIS — D1801 Hemangioma of skin and subcutaneous tissue: Secondary | ICD-10-CM | POA: Diagnosis not present

## 2012-09-15 DIAGNOSIS — Z125 Encounter for screening for malignant neoplasm of prostate: Secondary | ICD-10-CM | POA: Diagnosis not present

## 2012-09-15 DIAGNOSIS — R7301 Impaired fasting glucose: Secondary | ICD-10-CM | POA: Diagnosis not present

## 2012-09-15 DIAGNOSIS — E785 Hyperlipidemia, unspecified: Secondary | ICD-10-CM | POA: Diagnosis not present

## 2012-09-15 DIAGNOSIS — R82998 Other abnormal findings in urine: Secondary | ICD-10-CM | POA: Diagnosis not present

## 2012-09-22 DIAGNOSIS — H9190 Unspecified hearing loss, unspecified ear: Secondary | ICD-10-CM | POA: Diagnosis not present

## 2012-09-22 DIAGNOSIS — R7301 Impaired fasting glucose: Secondary | ICD-10-CM | POA: Diagnosis not present

## 2012-09-22 DIAGNOSIS — E785 Hyperlipidemia, unspecified: Secondary | ICD-10-CM | POA: Diagnosis not present

## 2012-09-22 DIAGNOSIS — Z1331 Encounter for screening for depression: Secondary | ICD-10-CM | POA: Diagnosis not present

## 2012-09-22 DIAGNOSIS — Z0000 Encounter for general adult medical examination without abnormal findings: Secondary | ICD-10-CM | POA: Diagnosis not present

## 2012-09-22 DIAGNOSIS — Z125 Encounter for screening for malignant neoplasm of prostate: Secondary | ICD-10-CM | POA: Diagnosis not present

## 2012-09-22 DIAGNOSIS — K219 Gastro-esophageal reflux disease without esophagitis: Secondary | ICD-10-CM | POA: Diagnosis not present

## 2012-09-22 DIAGNOSIS — E669 Obesity, unspecified: Secondary | ICD-10-CM | POA: Diagnosis not present

## 2012-09-22 DIAGNOSIS — IMO0002 Reserved for concepts with insufficient information to code with codable children: Secondary | ICD-10-CM | POA: Diagnosis not present

## 2012-09-22 DIAGNOSIS — F17200 Nicotine dependence, unspecified, uncomplicated: Secondary | ICD-10-CM | POA: Diagnosis not present

## 2012-10-01 ENCOUNTER — Encounter (INDEPENDENT_AMBULATORY_CARE_PROVIDER_SITE_OTHER): Payer: Self-pay | Admitting: Surgery

## 2012-10-01 ENCOUNTER — Ambulatory Visit (INDEPENDENT_AMBULATORY_CARE_PROVIDER_SITE_OTHER): Payer: Medicare Other | Admitting: Surgery

## 2012-10-01 VITALS — BP 120/64 | HR 62 | Resp 16 | Ht 74.0 in | Wt 277.6 lb

## 2012-10-01 DIAGNOSIS — K4090 Unilateral inguinal hernia, without obstruction or gangrene, not specified as recurrent: Secondary | ICD-10-CM

## 2012-10-01 NOTE — Progress Notes (Signed)
General Surgery [facility]  Visit Diagnoses: 1. Inguinal hernia unilateral, non-recurrent     HISTORY: Patient is a 72 year old male who underwent right inguinal hernia repair on 08/01/2012. He returns today for his second postoperative visit. Overall he is doing well. He is anxious to return to golf and other physical activities.  EXAM: Surgical incision is well-healed. There is a small area of separation at the lateral extent of the incision but no drainage and no sign of cellulitis or infection. Palpation in the inguinal canal shows that most of the soft tissue edema has resolved. With cough and Valsalva there is no sign of recurrence.  IMPRESSION: Status post right inguinal hernia repair with mesh  PLAN: Patient will continue to apply topical creams to his incision. He is released to full activity without restriction. He will return for surgical care as needed.  Velora Heckler, MD, FACS General & Endocrine Surgery [facility]

## 2012-10-01 NOTE — Patient Instructions (Signed)
  COCOA BUTTER & VITAMIN E CREAM  ([employer]mer[employer] or other brand)  Apply cocoa butter/vitamin E cream to your incision 2 - 3 times daily.  Massage cream into incision for one minute with each application.  Use sunscreen (50 SPF or higher) for first 6 months after surgery if area is exposed to sun.  You may substitute Mederma or other scar reducing creams as desired.   

## 2012-10-07 DIAGNOSIS — H4011X0 Primary open-angle glaucoma, stage unspecified: Secondary | ICD-10-CM | POA: Diagnosis not present

## 2012-10-07 DIAGNOSIS — H409 Unspecified glaucoma: Secondary | ICD-10-CM | POA: Diagnosis not present

## 2012-10-15 DIAGNOSIS — H903 Sensorineural hearing loss, bilateral: Secondary | ICD-10-CM | POA: Diagnosis not present

## 2012-10-23 DIAGNOSIS — Z8551 Personal history of malignant neoplasm of bladder: Secondary | ICD-10-CM | POA: Diagnosis not present

## 2012-12-29 DIAGNOSIS — Z23 Encounter for immunization: Secondary | ICD-10-CM | POA: Diagnosis not present

## 2013-02-13 ENCOUNTER — Other Ambulatory Visit: Payer: Self-pay | Admitting: Gastroenterology

## 2013-02-13 DIAGNOSIS — K5730 Diverticulosis of large intestine without perforation or abscess without bleeding: Secondary | ICD-10-CM | POA: Diagnosis not present

## 2013-02-13 DIAGNOSIS — Z86010 Personal history of colonic polyps: Secondary | ICD-10-CM | POA: Diagnosis not present

## 2013-02-13 DIAGNOSIS — K620 Anal polyp: Secondary | ICD-10-CM | POA: Diagnosis not present

## 2013-02-13 DIAGNOSIS — D126 Benign neoplasm of colon, unspecified: Secondary | ICD-10-CM | POA: Diagnosis not present

## 2013-02-13 DIAGNOSIS — Z09 Encounter for follow-up examination after completed treatment for conditions other than malignant neoplasm: Secondary | ICD-10-CM | POA: Diagnosis not present

## 2013-03-06 DIAGNOSIS — L820 Inflamed seborrheic keratosis: Secondary | ICD-10-CM | POA: Diagnosis not present

## 2013-03-06 DIAGNOSIS — L739 Follicular disorder, unspecified: Secondary | ICD-10-CM | POA: Diagnosis not present

## 2013-03-20 DIAGNOSIS — IMO0002 Reserved for concepts with insufficient information to code with codable children: Secondary | ICD-10-CM | POA: Diagnosis not present

## 2013-03-20 DIAGNOSIS — F17200 Nicotine dependence, unspecified, uncomplicated: Secondary | ICD-10-CM | POA: Diagnosis not present

## 2013-03-20 DIAGNOSIS — E785 Hyperlipidemia, unspecified: Secondary | ICD-10-CM | POA: Diagnosis not present

## 2013-03-20 DIAGNOSIS — R7301 Impaired fasting glucose: Secondary | ICD-10-CM | POA: Diagnosis not present

## 2013-03-20 DIAGNOSIS — Z6836 Body mass index (BMI) 36.0-36.9, adult: Secondary | ICD-10-CM | POA: Diagnosis not present

## 2013-03-20 DIAGNOSIS — I10 Essential (primary) hypertension: Secondary | ICD-10-CM | POA: Diagnosis not present

## 2013-03-30 DIAGNOSIS — M25559 Pain in unspecified hip: Secondary | ICD-10-CM | POA: Diagnosis not present

## 2013-03-30 DIAGNOSIS — M5137 Other intervertebral disc degeneration, lumbosacral region: Secondary | ICD-10-CM | POA: Diagnosis not present

## 2013-03-30 DIAGNOSIS — IMO0002 Reserved for concepts with insufficient information to code with codable children: Secondary | ICD-10-CM | POA: Diagnosis not present

## 2013-03-30 DIAGNOSIS — M999 Biomechanical lesion, unspecified: Secondary | ICD-10-CM | POA: Diagnosis not present

## 2013-03-31 DIAGNOSIS — M999 Biomechanical lesion, unspecified: Secondary | ICD-10-CM | POA: Diagnosis not present

## 2013-03-31 DIAGNOSIS — M25559 Pain in unspecified hip: Secondary | ICD-10-CM | POA: Diagnosis not present

## 2013-03-31 DIAGNOSIS — IMO0002 Reserved for concepts with insufficient information to code with codable children: Secondary | ICD-10-CM | POA: Diagnosis not present

## 2013-03-31 DIAGNOSIS — M5137 Other intervertebral disc degeneration, lumbosacral region: Secondary | ICD-10-CM | POA: Diagnosis not present

## 2013-04-01 DIAGNOSIS — IMO0002 Reserved for concepts with insufficient information to code with codable children: Secondary | ICD-10-CM | POA: Diagnosis not present

## 2013-04-01 DIAGNOSIS — M5137 Other intervertebral disc degeneration, lumbosacral region: Secondary | ICD-10-CM | POA: Diagnosis not present

## 2013-04-01 DIAGNOSIS — M999 Biomechanical lesion, unspecified: Secondary | ICD-10-CM | POA: Diagnosis not present

## 2013-04-01 DIAGNOSIS — M25559 Pain in unspecified hip: Secondary | ICD-10-CM | POA: Diagnosis not present

## 2013-04-06 DIAGNOSIS — M5137 Other intervertebral disc degeneration, lumbosacral region: Secondary | ICD-10-CM | POA: Diagnosis not present

## 2013-04-06 DIAGNOSIS — M999 Biomechanical lesion, unspecified: Secondary | ICD-10-CM | POA: Diagnosis not present

## 2013-04-06 DIAGNOSIS — M25559 Pain in unspecified hip: Secondary | ICD-10-CM | POA: Diagnosis not present

## 2013-04-06 DIAGNOSIS — IMO0002 Reserved for concepts with insufficient information to code with codable children: Secondary | ICD-10-CM | POA: Diagnosis not present

## 2013-04-13 DIAGNOSIS — M5137 Other intervertebral disc degeneration, lumbosacral region: Secondary | ICD-10-CM | POA: Diagnosis not present

## 2013-04-13 DIAGNOSIS — M999 Biomechanical lesion, unspecified: Secondary | ICD-10-CM | POA: Diagnosis not present

## 2013-04-13 DIAGNOSIS — M25559 Pain in unspecified hip: Secondary | ICD-10-CM | POA: Diagnosis not present

## 2013-04-13 DIAGNOSIS — IMO0002 Reserved for concepts with insufficient information to code with codable children: Secondary | ICD-10-CM | POA: Diagnosis not present

## 2013-04-14 DIAGNOSIS — H4011X0 Primary open-angle glaucoma, stage unspecified: Secondary | ICD-10-CM | POA: Diagnosis not present

## 2013-04-14 DIAGNOSIS — H409 Unspecified glaucoma: Secondary | ICD-10-CM | POA: Diagnosis not present

## 2013-04-20 DIAGNOSIS — IMO0002 Reserved for concepts with insufficient information to code with codable children: Secondary | ICD-10-CM | POA: Diagnosis not present

## 2013-04-20 DIAGNOSIS — M5137 Other intervertebral disc degeneration, lumbosacral region: Secondary | ICD-10-CM | POA: Diagnosis not present

## 2013-04-20 DIAGNOSIS — M25559 Pain in unspecified hip: Secondary | ICD-10-CM | POA: Diagnosis not present

## 2013-04-20 DIAGNOSIS — M999 Biomechanical lesion, unspecified: Secondary | ICD-10-CM | POA: Diagnosis not present

## 2013-04-27 DIAGNOSIS — IMO0002 Reserved for concepts with insufficient information to code with codable children: Secondary | ICD-10-CM | POA: Diagnosis not present

## 2013-04-27 DIAGNOSIS — M5137 Other intervertebral disc degeneration, lumbosacral region: Secondary | ICD-10-CM | POA: Diagnosis not present

## 2013-04-27 DIAGNOSIS — M999 Biomechanical lesion, unspecified: Secondary | ICD-10-CM | POA: Diagnosis not present

## 2013-04-27 DIAGNOSIS — M25559 Pain in unspecified hip: Secondary | ICD-10-CM | POA: Diagnosis not present

## 2013-05-11 DIAGNOSIS — M25559 Pain in unspecified hip: Secondary | ICD-10-CM | POA: Diagnosis not present

## 2013-05-11 DIAGNOSIS — M5137 Other intervertebral disc degeneration, lumbosacral region: Secondary | ICD-10-CM | POA: Diagnosis not present

## 2013-05-11 DIAGNOSIS — IMO0002 Reserved for concepts with insufficient information to code with codable children: Secondary | ICD-10-CM | POA: Diagnosis not present

## 2013-05-11 DIAGNOSIS — M999 Biomechanical lesion, unspecified: Secondary | ICD-10-CM | POA: Diagnosis not present

## 2013-06-01 DIAGNOSIS — M999 Biomechanical lesion, unspecified: Secondary | ICD-10-CM | POA: Diagnosis not present

## 2013-06-01 DIAGNOSIS — IMO0002 Reserved for concepts with insufficient information to code with codable children: Secondary | ICD-10-CM | POA: Diagnosis not present

## 2013-06-01 DIAGNOSIS — M25559 Pain in unspecified hip: Secondary | ICD-10-CM | POA: Diagnosis not present

## 2013-06-01 DIAGNOSIS — M5137 Other intervertebral disc degeneration, lumbosacral region: Secondary | ICD-10-CM | POA: Diagnosis not present

## 2013-09-07 DIAGNOSIS — L280 Lichen simplex chronicus: Secondary | ICD-10-CM | POA: Diagnosis not present

## 2013-09-07 DIAGNOSIS — L570 Actinic keratosis: Secondary | ICD-10-CM | POA: Diagnosis not present

## 2013-09-07 DIAGNOSIS — L821 Other seborrheic keratosis: Secondary | ICD-10-CM | POA: Diagnosis not present

## 2013-09-07 DIAGNOSIS — D2370 Other benign neoplasm of skin of unspecified lower limb, including hip: Secondary | ICD-10-CM | POA: Diagnosis not present

## 2013-09-07 DIAGNOSIS — L738 Other specified follicular disorders: Secondary | ICD-10-CM | POA: Diagnosis not present

## 2013-10-12 DIAGNOSIS — H409 Unspecified glaucoma: Secondary | ICD-10-CM | POA: Diagnosis not present

## 2013-10-12 DIAGNOSIS — H4011X0 Primary open-angle glaucoma, stage unspecified: Secondary | ICD-10-CM | POA: Diagnosis not present

## 2013-10-16 DIAGNOSIS — E785 Hyperlipidemia, unspecified: Secondary | ICD-10-CM | POA: Diagnosis not present

## 2013-10-16 DIAGNOSIS — I10 Essential (primary) hypertension: Secondary | ICD-10-CM | POA: Diagnosis not present

## 2013-10-16 DIAGNOSIS — R7301 Impaired fasting glucose: Secondary | ICD-10-CM | POA: Diagnosis not present

## 2013-10-16 DIAGNOSIS — Z125 Encounter for screening for malignant neoplasm of prostate: Secondary | ICD-10-CM | POA: Diagnosis not present

## 2013-10-22 DIAGNOSIS — R82998 Other abnormal findings in urine: Secondary | ICD-10-CM | POA: Diagnosis not present

## 2013-10-22 DIAGNOSIS — C679 Malignant neoplasm of bladder, unspecified: Secondary | ICD-10-CM | POA: Diagnosis not present

## 2013-10-22 DIAGNOSIS — R319 Hematuria, unspecified: Secondary | ICD-10-CM | POA: Diagnosis not present

## 2013-10-22 DIAGNOSIS — Z8551 Personal history of malignant neoplasm of bladder: Secondary | ICD-10-CM | POA: Diagnosis not present

## 2013-10-22 DIAGNOSIS — D090 Carcinoma in situ of bladder: Secondary | ICD-10-CM | POA: Diagnosis not present

## 2013-10-23 ENCOUNTER — Other Ambulatory Visit: Payer: Self-pay | Admitting: Internal Medicine

## 2013-10-23 DIAGNOSIS — Z139 Encounter for screening, unspecified: Secondary | ICD-10-CM

## 2013-10-23 DIAGNOSIS — Z1331 Encounter for screening for depression: Secondary | ICD-10-CM | POA: Diagnosis not present

## 2013-10-23 DIAGNOSIS — E785 Hyperlipidemia, unspecified: Secondary | ICD-10-CM | POA: Diagnosis not present

## 2013-10-23 DIAGNOSIS — F17200 Nicotine dependence, unspecified, uncomplicated: Secondary | ICD-10-CM | POA: Diagnosis not present

## 2013-10-23 DIAGNOSIS — Z0000 Encounter for general adult medical examination without abnormal findings: Secondary | ICD-10-CM | POA: Diagnosis not present

## 2013-10-23 DIAGNOSIS — E669 Obesity, unspecified: Secondary | ICD-10-CM | POA: Diagnosis not present

## 2013-10-23 DIAGNOSIS — Z23 Encounter for immunization: Secondary | ICD-10-CM | POA: Diagnosis not present

## 2013-10-23 DIAGNOSIS — M545 Low back pain, unspecified: Secondary | ICD-10-CM | POA: Diagnosis not present

## 2013-10-23 DIAGNOSIS — I10 Essential (primary) hypertension: Secondary | ICD-10-CM | POA: Diagnosis not present

## 2013-10-23 DIAGNOSIS — IMO0002 Reserved for concepts with insufficient information to code with codable children: Secondary | ICD-10-CM | POA: Diagnosis not present

## 2013-10-29 ENCOUNTER — Ambulatory Visit
Admission: RE | Admit: 2013-10-29 | Discharge: 2013-10-29 | Disposition: A | Discharge status: Home / Self Care | Payer: Medicare Other | Source: Ambulatory Visit | Attending: Internal Medicine | Admitting: Internal Medicine

## 2013-10-29 DIAGNOSIS — Z122 Encounter for screening for malignant neoplasm of respiratory organs: Secondary | ICD-10-CM | POA: Diagnosis not present

## 2013-10-29 DIAGNOSIS — J984 Other disorders of lung: Secondary | ICD-10-CM | POA: Diagnosis not present

## 2013-10-29 DIAGNOSIS — Z139 Encounter for screening, unspecified: Secondary | ICD-10-CM

## 2013-10-29 DIAGNOSIS — Z87891 Personal history of nicotine dependence: Secondary | ICD-10-CM | POA: Diagnosis not present

## 2013-11-02 ENCOUNTER — Other Ambulatory Visit: Payer: Self-pay | Admitting: Internal Medicine

## 2013-11-02 DIAGNOSIS — R911 Solitary pulmonary nodule: Secondary | ICD-10-CM

## 2013-11-17 DIAGNOSIS — R82998 Other abnormal findings in urine: Secondary | ICD-10-CM | POA: Diagnosis not present

## 2013-11-17 DIAGNOSIS — N323 Diverticulum of bladder: Secondary | ICD-10-CM | POA: Diagnosis not present

## 2013-11-17 DIAGNOSIS — I10 Essential (primary) hypertension: Secondary | ICD-10-CM | POA: Diagnosis not present

## 2013-11-17 DIAGNOSIS — N400 Enlarged prostate without lower urinary tract symptoms: Secondary | ICD-10-CM | POA: Diagnosis not present

## 2013-11-17 DIAGNOSIS — Q602 Renal agenesis, unspecified: Secondary | ICD-10-CM | POA: Diagnosis not present

## 2013-11-17 DIAGNOSIS — N320 Bladder-neck obstruction: Secondary | ICD-10-CM | POA: Diagnosis not present

## 2013-11-17 DIAGNOSIS — N312 Flaccid neuropathic bladder, not elsewhere classified: Secondary | ICD-10-CM | POA: Diagnosis not present

## 2013-11-17 DIAGNOSIS — Z8551 Personal history of malignant neoplasm of bladder: Secondary | ICD-10-CM | POA: Diagnosis not present

## 2013-11-17 DIAGNOSIS — Z466 Encounter for fitting and adjustment of urinary device: Secondary | ICD-10-CM | POA: Diagnosis not present

## 2013-11-17 DIAGNOSIS — Q605 Renal hypoplasia, unspecified: Secondary | ICD-10-CM | POA: Diagnosis not present

## 2013-11-17 DIAGNOSIS — N3289 Other specified disorders of bladder: Secondary | ICD-10-CM | POA: Diagnosis not present

## 2013-11-30 DIAGNOSIS — Z466 Encounter for fitting and adjustment of urinary device: Secondary | ICD-10-CM | POA: Diagnosis not present

## 2013-12-28 DIAGNOSIS — Z23 Encounter for immunization: Secondary | ICD-10-CM | POA: Diagnosis not present

## 2014-03-10 DIAGNOSIS — L821 Other seborrheic keratosis: Secondary | ICD-10-CM | POA: Diagnosis not present

## 2014-03-10 DIAGNOSIS — L570 Actinic keratosis: Secondary | ICD-10-CM | POA: Diagnosis not present

## 2014-03-10 DIAGNOSIS — L853 Xerosis cutis: Secondary | ICD-10-CM | POA: Diagnosis not present

## 2014-03-10 DIAGNOSIS — D2239 Melanocytic nevi of other parts of face: Secondary | ICD-10-CM | POA: Diagnosis not present

## 2014-03-10 DIAGNOSIS — L814 Other melanin hyperpigmentation: Secondary | ICD-10-CM | POA: Diagnosis not present

## 2014-03-10 DIAGNOSIS — D1801 Hemangioma of skin and subcutaneous tissue: Secondary | ICD-10-CM | POA: Diagnosis not present

## 2014-03-10 DIAGNOSIS — D2272 Melanocytic nevi of left lower limb, including hip: Secondary | ICD-10-CM | POA: Diagnosis not present

## 2014-03-10 DIAGNOSIS — D225 Melanocytic nevi of trunk: Secondary | ICD-10-CM | POA: Diagnosis not present

## 2014-04-06 DIAGNOSIS — M9904 Segmental and somatic dysfunction of sacral region: Secondary | ICD-10-CM | POA: Diagnosis not present

## 2014-04-06 DIAGNOSIS — M9905 Segmental and somatic dysfunction of pelvic region: Secondary | ICD-10-CM | POA: Diagnosis not present

## 2014-04-06 DIAGNOSIS — M5136 Other intervertebral disc degeneration, lumbar region: Secondary | ICD-10-CM | POA: Diagnosis not present

## 2014-04-06 DIAGNOSIS — M9903 Segmental and somatic dysfunction of lumbar region: Secondary | ICD-10-CM | POA: Diagnosis not present

## 2014-04-06 DIAGNOSIS — M5414 Radiculopathy, thoracic region: Secondary | ICD-10-CM | POA: Diagnosis not present

## 2014-04-06 DIAGNOSIS — M25559 Pain in unspecified hip: Secondary | ICD-10-CM | POA: Diagnosis not present

## 2014-04-27 DIAGNOSIS — I10 Essential (primary) hypertension: Secondary | ICD-10-CM | POA: Diagnosis not present

## 2014-04-27 DIAGNOSIS — H4011X1 Primary open-angle glaucoma, mild stage: Secondary | ICD-10-CM | POA: Diagnosis not present

## 2014-04-27 DIAGNOSIS — H2513 Age-related nuclear cataract, bilateral: Secondary | ICD-10-CM | POA: Diagnosis not present

## 2014-04-30 ENCOUNTER — Other Ambulatory Visit: Payer: Self-pay | Admitting: Internal Medicine

## 2014-04-30 ENCOUNTER — Ambulatory Visit
Admission: RE | Admit: 2014-04-30 | Discharge: 2014-04-30 | Disposition: A | Discharge status: Home / Self Care | Payer: Medicare Other | Source: Ambulatory Visit | Attending: Internal Medicine | Admitting: Internal Medicine

## 2014-04-30 DIAGNOSIS — J9809 Other diseases of bronchus, not elsewhere classified: Secondary | ICD-10-CM | POA: Diagnosis not present

## 2014-04-30 DIAGNOSIS — R911 Solitary pulmonary nodule: Secondary | ICD-10-CM

## 2014-04-30 DIAGNOSIS — I2510 Atherosclerotic heart disease of native coronary artery without angina pectoris: Secondary | ICD-10-CM | POA: Diagnosis not present

## 2014-04-30 DIAGNOSIS — Z122 Encounter for screening for malignant neoplasm of respiratory organs: Secondary | ICD-10-CM | POA: Diagnosis not present

## 2014-04-30 DIAGNOSIS — Z87891 Personal history of nicotine dependence: Secondary | ICD-10-CM | POA: Diagnosis not present

## 2014-05-17 DIAGNOSIS — R194 Change in bowel habit: Secondary | ICD-10-CM | POA: Diagnosis not present

## 2014-05-17 DIAGNOSIS — K5730 Diverticulosis of large intestine without perforation or abscess without bleeding: Secondary | ICD-10-CM | POA: Diagnosis not present

## 2014-05-17 DIAGNOSIS — Z86010 Personal history of colonic polyps: Secondary | ICD-10-CM | POA: Diagnosis not present

## 2014-10-05 DIAGNOSIS — M9904 Segmental and somatic dysfunction of sacral region: Secondary | ICD-10-CM | POA: Diagnosis not present

## 2014-10-05 DIAGNOSIS — M9905 Segmental and somatic dysfunction of pelvic region: Secondary | ICD-10-CM | POA: Diagnosis not present

## 2014-10-05 DIAGNOSIS — M5414 Radiculopathy, thoracic region: Secondary | ICD-10-CM | POA: Diagnosis not present

## 2014-10-05 DIAGNOSIS — M5136 Other intervertebral disc degeneration, lumbar region: Secondary | ICD-10-CM | POA: Diagnosis not present

## 2014-10-05 DIAGNOSIS — M9903 Segmental and somatic dysfunction of lumbar region: Secondary | ICD-10-CM | POA: Diagnosis not present

## 2014-10-05 DIAGNOSIS — M25559 Pain in unspecified hip: Secondary | ICD-10-CM | POA: Diagnosis not present

## 2014-10-26 DIAGNOSIS — H2513 Age-related nuclear cataract, bilateral: Secondary | ICD-10-CM | POA: Diagnosis not present

## 2014-10-26 DIAGNOSIS — H4011X1 Primary open-angle glaucoma, mild stage: Secondary | ICD-10-CM | POA: Diagnosis not present

## 2014-10-28 DIAGNOSIS — Z8551 Personal history of malignant neoplasm of bladder: Secondary | ICD-10-CM | POA: Diagnosis not present

## 2014-10-28 DIAGNOSIS — Z809 Family history of malignant neoplasm, unspecified: Secondary | ICD-10-CM | POA: Diagnosis not present

## 2014-10-28 DIAGNOSIS — F17210 Nicotine dependence, cigarettes, uncomplicated: Secondary | ICD-10-CM | POA: Diagnosis not present

## 2014-10-28 DIAGNOSIS — Z8546 Personal history of malignant neoplasm of prostate: Secondary | ICD-10-CM | POA: Diagnosis not present

## 2014-10-28 DIAGNOSIS — C672 Malignant neoplasm of lateral wall of bladder: Secondary | ICD-10-CM | POA: Insufficient documentation

## 2014-10-28 DIAGNOSIS — I10 Essential (primary) hypertension: Secondary | ICD-10-CM | POA: Diagnosis not present

## 2014-10-28 DIAGNOSIS — Z7982 Long term (current) use of aspirin: Secondary | ICD-10-CM | POA: Diagnosis not present

## 2014-10-28 DIAGNOSIS — C679 Malignant neoplasm of bladder, unspecified: Secondary | ICD-10-CM | POA: Diagnosis not present

## 2014-10-28 DIAGNOSIS — Z08 Encounter for follow-up examination after completed treatment for malignant neoplasm: Secondary | ICD-10-CM | POA: Diagnosis not present

## 2014-11-01 DIAGNOSIS — R7301 Impaired fasting glucose: Secondary | ICD-10-CM | POA: Diagnosis not present

## 2014-11-01 DIAGNOSIS — I10 Essential (primary) hypertension: Secondary | ICD-10-CM | POA: Diagnosis not present

## 2014-11-01 DIAGNOSIS — Z125 Encounter for screening for malignant neoplasm of prostate: Secondary | ICD-10-CM | POA: Diagnosis not present

## 2014-11-01 DIAGNOSIS — E785 Hyperlipidemia, unspecified: Secondary | ICD-10-CM | POA: Diagnosis not present

## 2014-11-08 DIAGNOSIS — K4090 Unilateral inguinal hernia, without obstruction or gangrene, not specified as recurrent: Secondary | ICD-10-CM | POA: Diagnosis not present

## 2014-11-08 DIAGNOSIS — R7301 Impaired fasting glucose: Secondary | ICD-10-CM | POA: Diagnosis not present

## 2014-11-08 DIAGNOSIS — J449 Chronic obstructive pulmonary disease, unspecified: Secondary | ICD-10-CM | POA: Diagnosis not present

## 2014-11-08 DIAGNOSIS — R918 Other nonspecific abnormal finding of lung field: Secondary | ICD-10-CM | POA: Diagnosis not present

## 2014-11-08 DIAGNOSIS — M5380 Other specified dorsopathies, site unspecified: Secondary | ICD-10-CM | POA: Diagnosis not present

## 2014-11-08 DIAGNOSIS — E669 Obesity, unspecified: Secondary | ICD-10-CM | POA: Diagnosis not present

## 2014-11-08 DIAGNOSIS — Z0000 Encounter for general adult medical examination without abnormal findings: Secondary | ICD-10-CM | POA: Diagnosis not present

## 2014-11-08 DIAGNOSIS — H9190 Unspecified hearing loss, unspecified ear: Secondary | ICD-10-CM | POA: Diagnosis not present

## 2014-11-08 DIAGNOSIS — Z1389 Encounter for screening for other disorder: Secondary | ICD-10-CM | POA: Diagnosis not present

## 2014-11-08 DIAGNOSIS — I10 Essential (primary) hypertension: Secondary | ICD-10-CM | POA: Diagnosis not present

## 2014-11-08 DIAGNOSIS — N281 Cyst of kidney, acquired: Secondary | ICD-10-CM | POA: Diagnosis not present

## 2014-11-08 DIAGNOSIS — Z6837 Body mass index (BMI) 37.0-37.9, adult: Secondary | ICD-10-CM | POA: Diagnosis not present

## 2014-11-16 DIAGNOSIS — Z23 Encounter for immunization: Secondary | ICD-10-CM | POA: Diagnosis not present

## 2014-11-29 DIAGNOSIS — Z1212 Encounter for screening for malignant neoplasm of rectum: Secondary | ICD-10-CM | POA: Diagnosis not present

## 2014-11-30 DIAGNOSIS — M9904 Segmental and somatic dysfunction of sacral region: Secondary | ICD-10-CM | POA: Diagnosis not present

## 2014-11-30 DIAGNOSIS — M25559 Pain in unspecified hip: Secondary | ICD-10-CM | POA: Diagnosis not present

## 2014-11-30 DIAGNOSIS — M9903 Segmental and somatic dysfunction of lumbar region: Secondary | ICD-10-CM | POA: Diagnosis not present

## 2014-11-30 DIAGNOSIS — M9905 Segmental and somatic dysfunction of pelvic region: Secondary | ICD-10-CM | POA: Diagnosis not present

## 2014-11-30 DIAGNOSIS — M5414 Radiculopathy, thoracic region: Secondary | ICD-10-CM | POA: Diagnosis not present

## 2014-11-30 DIAGNOSIS — M5136 Other intervertebral disc degeneration, lumbar region: Secondary | ICD-10-CM | POA: Diagnosis not present

## 2014-12-28 DIAGNOSIS — Z23 Encounter for immunization: Secondary | ICD-10-CM | POA: Diagnosis not present

## 2015-01-26 DIAGNOSIS — M9905 Segmental and somatic dysfunction of pelvic region: Secondary | ICD-10-CM | POA: Diagnosis not present

## 2015-01-26 DIAGNOSIS — M5414 Radiculopathy, thoracic region: Secondary | ICD-10-CM | POA: Diagnosis not present

## 2015-01-26 DIAGNOSIS — M25559 Pain in unspecified hip: Secondary | ICD-10-CM | POA: Diagnosis not present

## 2015-01-26 DIAGNOSIS — M5136 Other intervertebral disc degeneration, lumbar region: Secondary | ICD-10-CM | POA: Diagnosis not present

## 2015-01-26 DIAGNOSIS — M9904 Segmental and somatic dysfunction of sacral region: Secondary | ICD-10-CM | POA: Diagnosis not present

## 2015-01-26 DIAGNOSIS — M9903 Segmental and somatic dysfunction of lumbar region: Secondary | ICD-10-CM | POA: Diagnosis not present

## 2015-02-23 DIAGNOSIS — M5136 Other intervertebral disc degeneration, lumbar region: Secondary | ICD-10-CM | POA: Diagnosis not present

## 2015-02-23 DIAGNOSIS — M25559 Pain in unspecified hip: Secondary | ICD-10-CM | POA: Diagnosis not present

## 2015-02-23 DIAGNOSIS — M9903 Segmental and somatic dysfunction of lumbar region: Secondary | ICD-10-CM | POA: Diagnosis not present

## 2015-02-23 DIAGNOSIS — M9905 Segmental and somatic dysfunction of pelvic region: Secondary | ICD-10-CM | POA: Diagnosis not present

## 2015-02-23 DIAGNOSIS — M5414 Radiculopathy, thoracic region: Secondary | ICD-10-CM | POA: Diagnosis not present

## 2015-02-23 DIAGNOSIS — M9904 Segmental and somatic dysfunction of sacral region: Secondary | ICD-10-CM | POA: Diagnosis not present

## 2015-03-09 DIAGNOSIS — D2262 Melanocytic nevi of left upper limb, including shoulder: Secondary | ICD-10-CM | POA: Diagnosis not present

## 2015-03-09 DIAGNOSIS — D1801 Hemangioma of skin and subcutaneous tissue: Secondary | ICD-10-CM | POA: Diagnosis not present

## 2015-03-09 DIAGNOSIS — C44719 Basal cell carcinoma of skin of left lower limb, including hip: Secondary | ICD-10-CM | POA: Diagnosis not present

## 2015-03-09 DIAGNOSIS — L820 Inflamed seborrheic keratosis: Secondary | ICD-10-CM | POA: Diagnosis not present

## 2015-03-09 DIAGNOSIS — D225 Melanocytic nevi of trunk: Secondary | ICD-10-CM | POA: Diagnosis not present

## 2015-03-29 DIAGNOSIS — M25559 Pain in unspecified hip: Secondary | ICD-10-CM | POA: Diagnosis not present

## 2015-03-29 DIAGNOSIS — M5136 Other intervertebral disc degeneration, lumbar region: Secondary | ICD-10-CM | POA: Diagnosis not present

## 2015-03-29 DIAGNOSIS — M9904 Segmental and somatic dysfunction of sacral region: Secondary | ICD-10-CM | POA: Diagnosis not present

## 2015-03-29 DIAGNOSIS — M9905 Segmental and somatic dysfunction of pelvic region: Secondary | ICD-10-CM | POA: Diagnosis not present

## 2015-03-29 DIAGNOSIS — M5414 Radiculopathy, thoracic region: Secondary | ICD-10-CM | POA: Diagnosis not present

## 2015-03-29 DIAGNOSIS — M9903 Segmental and somatic dysfunction of lumbar region: Secondary | ICD-10-CM | POA: Diagnosis not present

## 2015-04-06 ENCOUNTER — Other Ambulatory Visit: Payer: Self-pay | Admitting: Internal Medicine

## 2015-04-06 DIAGNOSIS — R918 Other nonspecific abnormal finding of lung field: Secondary | ICD-10-CM

## 2015-04-15 DIAGNOSIS — R05 Cough: Secondary | ICD-10-CM | POA: Diagnosis not present

## 2015-04-15 DIAGNOSIS — J449 Chronic obstructive pulmonary disease, unspecified: Secondary | ICD-10-CM | POA: Diagnosis not present

## 2015-04-15 DIAGNOSIS — Z6839 Body mass index (BMI) 39.0-39.9, adult: Secondary | ICD-10-CM | POA: Diagnosis not present

## 2015-04-26 ENCOUNTER — Other Ambulatory Visit: Payer: Self-pay | Admitting: Gastroenterology

## 2015-04-26 DIAGNOSIS — H524 Presbyopia: Secondary | ICD-10-CM | POA: Diagnosis not present

## 2015-04-26 DIAGNOSIS — H401131 Primary open-angle glaucoma, bilateral, mild stage: Secondary | ICD-10-CM | POA: Diagnosis not present

## 2015-04-26 DIAGNOSIS — H2513 Age-related nuclear cataract, bilateral: Secondary | ICD-10-CM | POA: Diagnosis not present

## 2015-04-27 NOTE — Addendum Note (Signed)
Addended byClarene Essex on: 04/27/2015 02:35 PM   Modules accepted: Orders

## 2015-05-03 ENCOUNTER — Encounter (HOSPITAL_COMMUNITY): Payer: Self-pay | Admitting: *Deleted

## 2015-05-04 ENCOUNTER — Encounter (HOSPITAL_COMMUNITY)
Admission: RE | Disposition: A | Discharge status: Home / Self Care | Payer: Self-pay | Source: Ambulatory Visit | Attending: Gastroenterology

## 2015-05-04 ENCOUNTER — Ambulatory Visit (HOSPITAL_COMMUNITY)
Admission: RE | Admit: 2015-05-04 | Discharge: 2015-05-04 | Disposition: A | Discharge status: Home / Self Care | Payer: Medicare Other | Source: Ambulatory Visit | Attending: Gastroenterology | Admitting: Gastroenterology

## 2015-05-04 ENCOUNTER — Ambulatory Visit (HOSPITAL_COMMUNITY): Payer: Medicare Other | Admitting: Certified Registered"

## 2015-05-04 ENCOUNTER — Encounter (HOSPITAL_COMMUNITY): Payer: Self-pay | Admitting: *Deleted

## 2015-05-04 DIAGNOSIS — Z86010 Personal history of colonic polyps: Secondary | ICD-10-CM | POA: Insufficient documentation

## 2015-05-04 DIAGNOSIS — Z1211 Encounter for screening for malignant neoplasm of colon: Secondary | ICD-10-CM | POA: Diagnosis not present

## 2015-05-04 DIAGNOSIS — D122 Benign neoplasm of ascending colon: Secondary | ICD-10-CM | POA: Diagnosis not present

## 2015-05-04 DIAGNOSIS — K5790 Diverticulosis of intestine, part unspecified, without perforation or abscess without bleeding: Secondary | ICD-10-CM | POA: Diagnosis not present

## 2015-05-04 DIAGNOSIS — D123 Benign neoplasm of transverse colon: Secondary | ICD-10-CM | POA: Diagnosis not present

## 2015-05-04 DIAGNOSIS — K635 Polyp of colon: Secondary | ICD-10-CM | POA: Diagnosis not present

## 2015-05-04 DIAGNOSIS — K648 Other hemorrhoids: Secondary | ICD-10-CM | POA: Diagnosis not present

## 2015-05-04 DIAGNOSIS — Z6839 Body mass index (BMI) 39.0-39.9, adult: Secondary | ICD-10-CM | POA: Diagnosis not present

## 2015-05-04 DIAGNOSIS — Z87891 Personal history of nicotine dependence: Secondary | ICD-10-CM | POA: Diagnosis not present

## 2015-05-04 HISTORY — DX: Other cervical disc degeneration, unspecified cervical region: M50.30

## 2015-05-04 HISTORY — DX: Chronic obstructive pulmonary disease, unspecified: J44.9

## 2015-05-04 HISTORY — PX: COLONOSCOPY WITH PROPOFOL: SHX5780

## 2015-05-04 HISTORY — DX: Reserved for inherently not codable concepts without codable children: IMO0001

## 2015-05-04 SURGERY — COLONOSCOPY WITH PROPOFOL
Anesthesia: Monitor Anesthesia Care

## 2015-05-04 MED ORDER — SODIUM CHLORIDE 0.9 % IV SOLN: INTRAVENOUS | Status: DC

## 2015-05-04 MED ORDER — LACTATED RINGERS IV SOLN
INTRAVENOUS | Status: DC | PRN
  Administered 2015-05-04: 09:00:00 via INTRAVENOUS

## 2015-05-04 MED ORDER — PROPOFOL 500 MG/50ML IV EMUL
INTRAVENOUS | Status: DC | PRN
  Administered 2015-05-04: 100 ug/kg/min via INTRAVENOUS

## 2015-05-04 MED ORDER — LACTATED RINGERS IV SOLN
INTRAVENOUS | Status: DC
  Administered 2015-05-04: 1000 mL via INTRAVENOUS

## 2015-05-04 MED ORDER — PROPOFOL 10 MG/ML IV BOLUS
INTRAVENOUS | Status: DC | PRN
  Administered 2015-05-04 (×3): 20 mg via INTRAVENOUS

## 2015-05-04 NOTE — Anesthesia Preprocedure Evaluation (Signed)
Anesthesia Evaluation  Patient identified by MRN, date of birth, ID band Patient awake    Reviewed: Allergy & Precautions, NPO status , Patient's Chart, lab work & pertinent test results  History of Anesthesia Complications Negative for: history of anesthetic complications  Airway Mallampati: III  TM Distance: >3 FB Neck ROM: Full    Dental  (+) Teeth Intact,    Pulmonary neg shortness of breath, neg sleep apnea, neg COPD, neg recent URI, former smoker, neg PE   breath sounds clear to auscultation       Cardiovascular hypertension, Pt. on medications (-) angina(-) Past MI and (-) CHF  Rhythm:Regular     Neuro/Psych negative neurological ROS  negative psych ROS   GI/Hepatic negative GI ROS, Neg liver ROS,   Endo/Other  Morbid obesity  Renal/GU negative Renal ROS     Musculoskeletal  (+) Arthritis ,   Abdominal   Peds  Hematology negative hematology ROS (+)   Anesthesia Other Findings   Reproductive/Obstetrics                             Anesthesia Physical Anesthesia Plan  ASA: II  Anesthesia Plan: MAC   Post-op Pain Management:    Induction: Intravenous  Airway Management Planned: Natural Airway, Nasal Cannula and Simple Face Mask  Additional Equipment: None  Intra-op Plan:   Post-operative Plan:   Informed Consent: I have reviewed the patients History and Physical, chart, labs and discussed the procedure including the risks, benefits and alternatives for the proposed anesthesia with the patient or authorized representative who has indicated his/her understanding and acceptance.   Dental advisory given  Plan Discussed with: CRNA and Surgeon  Anesthesia Plan Comments:         Anesthesia Quick Evaluation

## 2015-05-04 NOTE — Anesthesia Procedure Notes (Signed)
Procedure Name: MAC Date/Time: 05/04/2015 9:41 AM Performed by: Eligha Bridegroom Pre-anesthesia Checklist: Patient identified, Timeout performed, Emergency Drugs available, Patient being monitored and Suction available Patient Re-evaluated:Patient Re-evaluated prior to inductionOxygen Delivery Method: Simple face mask Intubation Type: IV induction

## 2015-05-04 NOTE — Discharge Instructions (Addendum)
Call if question or problem or for biopsy report in 1 week and follow-up in the office as needed otherwise probably repeat colonoscopy in 3 years and hold aspirin and arthritis pills for 1 week but Tylenol over-the-counter okay   Colonoscopy, Care After These instructions give you information on caring for yourself after your procedure. Your doctor may also give you more specific instructions. Call your doctor if you have any problems or questions after your procedure. HOME CARE  Do not drive for 24 hours.  Do not sign important papers or use machinery for 24 hours.  You may shower.  You may go back to your usual activities, but go slower for the first 24 hours.  Take rest breaks often during the first 24 hours.  Walk around or use warm packs on your belly (abdomen) if you have belly cramping or gas.  Drink enough fluids to keep your pee (urine) clear or pale yellow.  Resume your normal diet. Avoid heavy or fried foods.  Avoid drinking alcohol for 24 hours or as told by your doctor.  Only take medicines as told by your doctor. If a tissue sample (biopsy) was taken during the procedure:   Do not take aspirin or blood thinners for 7 days, or as told by your doctor.  Do not drink alcohol for 7 days, or as told by your doctor.  Eat soft foods for the first 24 hours. GET HELP IF: You still have a small amount of blood in your poop (stool) 2-3 days after the procedure. GET HELP RIGHT AWAY IF:  You have more than a small amount of blood in your poop.  You see clumps of tissue (blood clots) in your poop.  Your belly is puffy (swollen).  You feel sick to your stomach (nauseous) or throw up (vomit).  You have a fever.  You have belly pain that gets worse and medicine does not help. MAKE SURE YOU:  Understand these instructions.  Will watch your condition.  Will get help right away if you are not doing well or get worse.   This information is not intended to replace  advice given to you by your health care provider. Make sure you discuss any questions you have with your health care provider.   Document Released: 04/21/2010 Document Revised: 03/24/2013 Document Reviewed: 11/24/2012 Elsevier Interactive Patient Education [employer].

## 2015-05-04 NOTE — Progress Notes (Signed)
[name]. 8:58 AM  Subjective: Patient without any new symptoms since we last saw him and no new medical problems  Objective: Vital signs stable afebrile no acute distress exam please see preassessment evaluation  Assessment: Difficult to remove colon polyps due for colonic screening  Plan: Okay to proceed with colonoscopy with anesthesia assistance  [facility]  Pager [phone] After 5PM or if no answer call [phone]

## 2015-05-04 NOTE — Transfer of Care (Signed)
Immediate Anesthesia Transfer of Care Note  Patient: Justin Atkinson.  Procedure(s) Performed: Procedure(s): COLONOSCOPY WITH PROPOFOL (N/A) HOT HEMOSTASIS (ARGON PLASMA COAGULATION/BICAP) (N/A)  Patient Location: PACU and Endoscopy Unit  Anesthesia Type:MAC  Level of Consciousness: awake, alert  and oriented  Airway & Oxygen Therapy: Patient Spontanous Breathing and Patient connected to nasal cannula oxygen  Post-op Assessment: Report given to RN and Post -op Vital signs reviewed and stable  Post vital signs: Reviewed and stable  Last Vitals:  Filed Vitals:   05/04/15 0803  BP: 154/64  Resp: 10    Complications: No apparent anesthesia complications

## 2015-05-04 NOTE — Op Note (Signed)
[facility] [place], [id number]   COLONOSCOPY PROCEDURE REPORT     EXAM DATE: 05/04/2015  PATIENT NAME:      Justin Atkinson, Justin Atkinson           MR #:      [id number]  BIRTHDATE:       May 31, 1940      VISIT #:     [phone]  ATTENDING:     Clarene Essex, MD     STATUS:     outpatient ASSISTANT:      William Dalton and Carlyn Reichert  INDICATIONS:  The patient is a 75 yr old male here for a colonoscopy due to high risk patient with personal history of colonic polyps.  PROCEDURE PERFORMED:     Colonoscopy with biopsy Colonoscopy with snare polypectomy MEDICATIONS:     Propofol 680 mg IV  50 mg lidocaine ESTIMATED BLOOD LOSS:     None  CONSENT: The patient understands the risks and benefits of the procedure and understands that these risks include, but are not limited to: sedation, allergic reaction, infection, perforation and/or bleeding. Alternative means of evaluation and treatment include, among others: physical exam, x-rays, and/or surgical intervention. The patient elects to proceed with this endoscopic procedure.  DESCRIPTION OF PROCEDURE: During intra-op preparation period all mechanical & medical equipment was checked for proper function. Hand hygiene and appropriate measures for infection prevention was taken. After the risks, benefits and alternatives of the procedure were thoroughly explained, Informed consent was verified, confirmed and timeout was successfully executed by the treatment team. A digital exam revealed no abnormalities of the rectum. The Pentax Adult Colon [id number] endoscope was introduced through the anus and advanced to the cecum, which was identified by both the appendix and ileocecal valve.this did require abdominal pressure only and the prep was adequate The instrument was then slowly withdrawn as the colon was fully examined.Estimated blood loss is zero unless otherwise noted in this procedure report. the  findings are recorded below      Retroflexed views revealed internal hemorrhoids. The scope was then completely withdrawn from the patient and the procedure terminated. SCOPE WITHDRAWAL TIME: see nurse's note    ADVERSE EVENTS:      There were no immediate complications.  IMPRESSIONS:     1. Small internal hemorrhoids 2. Few left-sided diverticuli 3. Distal transverse and splenic flexure small polyps cold biopsy 4. Transverse and ascending medium sized hyperplastic-appearing polyps status post piecemeal hot snares 5. Otherwise within normal limits to the cecum  RECOMMENDATIONS:     await pathology probably repeat colonoscopy in 3 years just to be sure no significant residual and to double check hepatic flexure where previously seen 1 was not re-found this time and will hold aspirin and nonsteroidals for 1 week and follow up when necessary RECALL:     as needed or in probable 3 years  _____________________________ Clarene Essex, MD eSigned:  Clarene Essex, MD 05/04/2015 10:45 AM   cc:  [name], M.D.   CPT CODES: ICD CODES:  The ICD and CPT codes recommended by this software are interpretations from the data that the clinical staff has captured with the software.  The verification of the translation of this report to the ICD and CPT codes and modifiers is the sole responsibility of the health care institution and practicing physician where this report was generated.  [employer] will not be held responsible for the validity of the ICD and CPT  codes included on this report.  AMA assumes no liability for data contained or not contained herein. CPT is a Designer, television/film set of the [employer].   PATIENT NAME:  Justin Atkinson, Justin Atkinson MR#: [id number]

## 2015-05-04 NOTE — Anesthesia Postprocedure Evaluation (Signed)
Anesthesia Post Note  Patient: Justin Atkinson.  Procedure(s) Performed: Procedure(s) (LRB): COLONOSCOPY WITH PROPOFOL (N/A) HOT HEMOSTASIS (ARGON PLASMA COAGULATION/BICAP) (N/A)  Patient location during evaluation: Endoscopy Anesthesia Type: MAC Level of consciousness: awake Pain management: pain level controlled Vital Signs Assessment: post-procedure vital signs reviewed and stable Respiratory status: spontaneous breathing Cardiovascular status: stable Postop Assessment: no signs of nausea or vomiting Anesthetic complications: no    Last Vitals:  Filed Vitals:   05/04/15 1110 05/04/15 1120  BP: 151/66 165/64  Pulse: 52 51  Resp: 16 15    Last Pain: There were no vitals filed for this visit.               Khiya Friese

## 2015-05-05 ENCOUNTER — Encounter (HOSPITAL_COMMUNITY): Payer: Self-pay | Admitting: Gastroenterology

## 2015-05-09 ENCOUNTER — Encounter (HOSPITAL_COMMUNITY): Payer: Self-pay | Admitting: Gastroenterology

## 2015-05-12 ENCOUNTER — Other Ambulatory Visit: Payer: Medicare Other

## 2015-05-12 ENCOUNTER — Ambulatory Visit
Admission: RE | Admit: 2015-05-12 | Discharge: 2015-05-12 | Disposition: A | Discharge status: Home / Self Care | Payer: Medicare Other | Source: Ambulatory Visit | Attending: Internal Medicine | Admitting: Internal Medicine

## 2015-05-12 DIAGNOSIS — R918 Other nonspecific abnormal finding of lung field: Secondary | ICD-10-CM

## 2015-05-12 DIAGNOSIS — Z87891 Personal history of nicotine dependence: Secondary | ICD-10-CM | POA: Diagnosis not present

## 2015-05-31 DIAGNOSIS — J449 Chronic obstructive pulmonary disease, unspecified: Secondary | ICD-10-CM | POA: Diagnosis not present

## 2015-05-31 DIAGNOSIS — E669 Obesity, unspecified: Secondary | ICD-10-CM | POA: Diagnosis not present

## 2015-05-31 DIAGNOSIS — R05 Cough: Secondary | ICD-10-CM | POA: Diagnosis not present

## 2015-05-31 DIAGNOSIS — Z6841 Body Mass Index (BMI) 40.0 and over, adult: Secondary | ICD-10-CM | POA: Diagnosis not present

## 2015-07-11 DIAGNOSIS — J3089 Other allergic rhinitis: Secondary | ICD-10-CM | POA: Diagnosis not present

## 2015-07-11 DIAGNOSIS — J449 Chronic obstructive pulmonary disease, unspecified: Secondary | ICD-10-CM | POA: Diagnosis not present

## 2015-07-11 DIAGNOSIS — Z6841 Body Mass Index (BMI) 40.0 and over, adult: Secondary | ICD-10-CM | POA: Diagnosis not present

## 2015-07-11 DIAGNOSIS — E669 Obesity, unspecified: Secondary | ICD-10-CM | POA: Diagnosis not present

## 2015-07-11 DIAGNOSIS — H6093 Unspecified otitis externa, bilateral: Secondary | ICD-10-CM | POA: Diagnosis not present

## 2015-07-19 DIAGNOSIS — J449 Chronic obstructive pulmonary disease, unspecified: Secondary | ICD-10-CM | POA: Diagnosis not present

## 2015-07-19 DIAGNOSIS — J3089 Other allergic rhinitis: Secondary | ICD-10-CM | POA: Diagnosis not present

## 2015-07-19 DIAGNOSIS — Z6841 Body Mass Index (BMI) 40.0 and over, adult: Secondary | ICD-10-CM | POA: Diagnosis not present

## 2015-08-01 DIAGNOSIS — J310 Chronic rhinitis: Secondary | ICD-10-CM | POA: Diagnosis not present

## 2015-08-01 DIAGNOSIS — H6531 Chronic mucoid otitis media, right ear: Secondary | ICD-10-CM | POA: Diagnosis not present

## 2015-08-01 DIAGNOSIS — H6533 Chronic mucoid otitis media, bilateral: Secondary | ICD-10-CM | POA: Diagnosis not present

## 2015-09-01 DIAGNOSIS — H6521 Chronic serous otitis media, right ear: Secondary | ICD-10-CM | POA: Diagnosis not present

## 2015-09-07 DIAGNOSIS — M9903 Segmental and somatic dysfunction of lumbar region: Secondary | ICD-10-CM | POA: Diagnosis not present

## 2015-09-07 DIAGNOSIS — M9904 Segmental and somatic dysfunction of sacral region: Secondary | ICD-10-CM | POA: Diagnosis not present

## 2015-09-07 DIAGNOSIS — M25559 Pain in unspecified hip: Secondary | ICD-10-CM | POA: Diagnosis not present

## 2015-09-07 DIAGNOSIS — M5414 Radiculopathy, thoracic region: Secondary | ICD-10-CM | POA: Diagnosis not present

## 2015-09-07 DIAGNOSIS — M9905 Segmental and somatic dysfunction of pelvic region: Secondary | ICD-10-CM | POA: Diagnosis not present

## 2015-09-07 DIAGNOSIS — M5136 Other intervertebral disc degeneration, lumbar region: Secondary | ICD-10-CM | POA: Diagnosis not present

## 2015-10-13 DIAGNOSIS — H5203 Hypermetropia, bilateral: Secondary | ICD-10-CM | POA: Diagnosis not present

## 2015-10-13 DIAGNOSIS — H401122 Primary open-angle glaucoma, left eye, moderate stage: Secondary | ICD-10-CM | POA: Diagnosis not present

## 2015-10-13 DIAGNOSIS — H401111 Primary open-angle glaucoma, right eye, mild stage: Secondary | ICD-10-CM | POA: Diagnosis not present

## 2015-10-13 DIAGNOSIS — H25013 Cortical age-related cataract, bilateral: Secondary | ICD-10-CM | POA: Diagnosis not present

## 2015-11-11 DIAGNOSIS — L723 Sebaceous cyst: Secondary | ICD-10-CM | POA: Diagnosis not present

## 2015-11-11 DIAGNOSIS — L089 Local infection of the skin and subcutaneous tissue, unspecified: Secondary | ICD-10-CM | POA: Diagnosis not present

## 2015-11-18 DIAGNOSIS — L0213 Carbuncle of neck: Secondary | ICD-10-CM | POA: Diagnosis not present

## 2015-11-18 DIAGNOSIS — L720 Epidermal cyst: Secondary | ICD-10-CM | POA: Diagnosis not present

## 2015-11-18 DIAGNOSIS — L0889 Other specified local infections of the skin and subcutaneous tissue: Secondary | ICD-10-CM | POA: Diagnosis not present

## 2015-11-18 DIAGNOSIS — L723 Sebaceous cyst: Secondary | ICD-10-CM | POA: Diagnosis not present

## 2015-11-18 DIAGNOSIS — L089 Local infection of the skin and subcutaneous tissue, unspecified: Secondary | ICD-10-CM | POA: Insufficient documentation

## 2015-11-18 DIAGNOSIS — L0212 Furuncle of neck: Secondary | ICD-10-CM | POA: Diagnosis not present

## 2015-11-25 DIAGNOSIS — Z125 Encounter for screening for malignant neoplasm of prostate: Secondary | ICD-10-CM | POA: Diagnosis not present

## 2015-11-25 DIAGNOSIS — I10 Essential (primary) hypertension: Secondary | ICD-10-CM | POA: Diagnosis not present

## 2015-11-25 DIAGNOSIS — R7301 Impaired fasting glucose: Secondary | ICD-10-CM | POA: Diagnosis not present

## 2015-11-25 DIAGNOSIS — E784 Other hyperlipidemia: Secondary | ICD-10-CM | POA: Diagnosis not present

## 2015-12-06 DIAGNOSIS — Z23 Encounter for immunization: Secondary | ICD-10-CM | POA: Diagnosis not present

## 2015-12-06 DIAGNOSIS — Z1389 Encounter for screening for other disorder: Secondary | ICD-10-CM | POA: Diagnosis not present

## 2015-12-06 DIAGNOSIS — I10 Essential (primary) hypertension: Secondary | ICD-10-CM | POA: Diagnosis not present

## 2015-12-06 DIAGNOSIS — E668 Other obesity: Secondary | ICD-10-CM | POA: Diagnosis not present

## 2015-12-06 DIAGNOSIS — M545 Low back pain: Secondary | ICD-10-CM | POA: Diagnosis not present

## 2015-12-06 DIAGNOSIS — H6980 Other specified disorders of Eustachian tube, unspecified ear: Secondary | ICD-10-CM | POA: Diagnosis not present

## 2015-12-06 DIAGNOSIS — Z1212 Encounter for screening for malignant neoplasm of rectum: Secondary | ICD-10-CM | POA: Diagnosis not present

## 2015-12-06 DIAGNOSIS — C679 Malignant neoplasm of bladder, unspecified: Secondary | ICD-10-CM | POA: Diagnosis not present

## 2015-12-06 DIAGNOSIS — Z6841 Body Mass Index (BMI) 40.0 and over, adult: Secondary | ICD-10-CM | POA: Diagnosis not present

## 2015-12-06 DIAGNOSIS — Z0000 Encounter for general adult medical examination without abnormal findings: Secondary | ICD-10-CM | POA: Diagnosis not present

## 2015-12-06 DIAGNOSIS — J449 Chronic obstructive pulmonary disease, unspecified: Secondary | ICD-10-CM | POA: Diagnosis not present

## 2015-12-06 DIAGNOSIS — M533 Sacrococcygeal disorders, not elsewhere classified: Secondary | ICD-10-CM | POA: Diagnosis not present

## 2015-12-06 DIAGNOSIS — M160 Bilateral primary osteoarthritis of hip: Secondary | ICD-10-CM | POA: Diagnosis not present

## 2015-12-06 DIAGNOSIS — M899 Disorder of bone, unspecified: Secondary | ICD-10-CM | POA: Diagnosis not present

## 2015-12-06 DIAGNOSIS — H9193 Unspecified hearing loss, bilateral: Secondary | ICD-10-CM | POA: Diagnosis not present

## 2015-12-06 DIAGNOSIS — E784 Other hyperlipidemia: Secondary | ICD-10-CM | POA: Diagnosis not present

## 2015-12-06 DIAGNOSIS — M47819 Spondylosis without myelopathy or radiculopathy, site unspecified: Secondary | ICD-10-CM | POA: Diagnosis not present

## 2015-12-06 DIAGNOSIS — R918 Other nonspecific abnormal finding of lung field: Secondary | ICD-10-CM | POA: Diagnosis not present

## 2015-12-29 DIAGNOSIS — M47816 Spondylosis without myelopathy or radiculopathy, lumbar region: Secondary | ICD-10-CM | POA: Diagnosis not present

## 2015-12-29 DIAGNOSIS — M47817 Spondylosis without myelopathy or radiculopathy, lumbosacral region: Secondary | ICD-10-CM | POA: Diagnosis not present

## 2015-12-29 DIAGNOSIS — M5136 Other intervertebral disc degeneration, lumbar region: Secondary | ICD-10-CM | POA: Diagnosis not present

## 2016-01-25 DIAGNOSIS — M9903 Segmental and somatic dysfunction of lumbar region: Secondary | ICD-10-CM | POA: Diagnosis not present

## 2016-01-25 DIAGNOSIS — M5136 Other intervertebral disc degeneration, lumbar region: Secondary | ICD-10-CM | POA: Diagnosis not present

## 2016-01-25 DIAGNOSIS — M25559 Pain in unspecified hip: Secondary | ICD-10-CM | POA: Diagnosis not present

## 2016-01-25 DIAGNOSIS — M9904 Segmental and somatic dysfunction of sacral region: Secondary | ICD-10-CM | POA: Diagnosis not present

## 2016-01-25 DIAGNOSIS — M5414 Radiculopathy, thoracic region: Secondary | ICD-10-CM | POA: Diagnosis not present

## 2016-01-25 DIAGNOSIS — M9905 Segmental and somatic dysfunction of pelvic region: Secondary | ICD-10-CM | POA: Diagnosis not present

## 2016-02-22 DIAGNOSIS — M9905 Segmental and somatic dysfunction of pelvic region: Secondary | ICD-10-CM | POA: Diagnosis not present

## 2016-02-22 DIAGNOSIS — M9903 Segmental and somatic dysfunction of lumbar region: Secondary | ICD-10-CM | POA: Diagnosis not present

## 2016-02-22 DIAGNOSIS — M9904 Segmental and somatic dysfunction of sacral region: Secondary | ICD-10-CM | POA: Diagnosis not present

## 2016-02-22 DIAGNOSIS — M5414 Radiculopathy, thoracic region: Secondary | ICD-10-CM | POA: Diagnosis not present

## 2016-02-22 DIAGNOSIS — M25559 Pain in unspecified hip: Secondary | ICD-10-CM | POA: Diagnosis not present

## 2016-02-22 DIAGNOSIS — M5136 Other intervertebral disc degeneration, lumbar region: Secondary | ICD-10-CM | POA: Diagnosis not present

## 2016-03-08 DIAGNOSIS — L918 Other hypertrophic disorders of the skin: Secondary | ICD-10-CM | POA: Diagnosis not present

## 2016-03-08 DIAGNOSIS — Z85828 Personal history of other malignant neoplasm of skin: Secondary | ICD-10-CM | POA: Diagnosis not present

## 2016-03-08 DIAGNOSIS — D1801 Hemangioma of skin and subcutaneous tissue: Secondary | ICD-10-CM | POA: Diagnosis not present

## 2016-03-08 DIAGNOSIS — L821 Other seborrheic keratosis: Secondary | ICD-10-CM | POA: Diagnosis not present

## 2016-03-08 DIAGNOSIS — L820 Inflamed seborrheic keratosis: Secondary | ICD-10-CM | POA: Diagnosis not present

## 2016-03-20 DIAGNOSIS — T8189XD Other complications of procedures, not elsewhere classified, subsequent encounter: Secondary | ICD-10-CM | POA: Diagnosis not present

## 2016-04-12 DIAGNOSIS — H401131 Primary open-angle glaucoma, bilateral, mild stage: Secondary | ICD-10-CM | POA: Diagnosis not present

## 2016-04-17 DIAGNOSIS — H401131 Primary open-angle glaucoma, bilateral, mild stage: Secondary | ICD-10-CM | POA: Diagnosis not present

## 2016-06-06 DIAGNOSIS — E119 Type 2 diabetes mellitus without complications: Secondary | ICD-10-CM | POA: Diagnosis not present

## 2016-06-13 DIAGNOSIS — M9905 Segmental and somatic dysfunction of pelvic region: Secondary | ICD-10-CM | POA: Diagnosis not present

## 2016-06-13 DIAGNOSIS — M25552 Pain in left hip: Secondary | ICD-10-CM | POA: Diagnosis not present

## 2016-06-13 DIAGNOSIS — M9903 Segmental and somatic dysfunction of lumbar region: Secondary | ICD-10-CM | POA: Diagnosis not present

## 2016-06-13 DIAGNOSIS — M5136 Other intervertebral disc degeneration, lumbar region: Secondary | ICD-10-CM | POA: Diagnosis not present

## 2016-06-13 DIAGNOSIS — M9904 Segmental and somatic dysfunction of sacral region: Secondary | ICD-10-CM | POA: Diagnosis not present

## 2016-06-13 DIAGNOSIS — M5414 Radiculopathy, thoracic region: Secondary | ICD-10-CM | POA: Diagnosis not present

## 2016-06-15 DIAGNOSIS — L853 Xerosis cutis: Secondary | ICD-10-CM | POA: Diagnosis not present

## 2016-06-15 DIAGNOSIS — L821 Other seborrheic keratosis: Secondary | ICD-10-CM | POA: Diagnosis not present

## 2016-06-15 DIAGNOSIS — L820 Inflamed seborrheic keratosis: Secondary | ICD-10-CM | POA: Diagnosis not present

## 2016-06-18 DIAGNOSIS — M9904 Segmental and somatic dysfunction of sacral region: Secondary | ICD-10-CM | POA: Diagnosis not present

## 2016-06-18 DIAGNOSIS — M9905 Segmental and somatic dysfunction of pelvic region: Secondary | ICD-10-CM | POA: Diagnosis not present

## 2016-06-18 DIAGNOSIS — M5414 Radiculopathy, thoracic region: Secondary | ICD-10-CM | POA: Diagnosis not present

## 2016-06-18 DIAGNOSIS — M25552 Pain in left hip: Secondary | ICD-10-CM | POA: Diagnosis not present

## 2016-06-18 DIAGNOSIS — M9903 Segmental and somatic dysfunction of lumbar region: Secondary | ICD-10-CM | POA: Diagnosis not present

## 2016-06-18 DIAGNOSIS — M5136 Other intervertebral disc degeneration, lumbar region: Secondary | ICD-10-CM | POA: Diagnosis not present

## 2016-06-19 DIAGNOSIS — M9904 Segmental and somatic dysfunction of sacral region: Secondary | ICD-10-CM | POA: Diagnosis not present

## 2016-06-19 DIAGNOSIS — M5414 Radiculopathy, thoracic region: Secondary | ICD-10-CM | POA: Diagnosis not present

## 2016-06-19 DIAGNOSIS — M25552 Pain in left hip: Secondary | ICD-10-CM | POA: Diagnosis not present

## 2016-06-19 DIAGNOSIS — M9905 Segmental and somatic dysfunction of pelvic region: Secondary | ICD-10-CM | POA: Diagnosis not present

## 2016-06-19 DIAGNOSIS — M9903 Segmental and somatic dysfunction of lumbar region: Secondary | ICD-10-CM | POA: Diagnosis not present

## 2016-06-19 DIAGNOSIS — M5136 Other intervertebral disc degeneration, lumbar region: Secondary | ICD-10-CM | POA: Diagnosis not present

## 2016-06-21 DIAGNOSIS — M9903 Segmental and somatic dysfunction of lumbar region: Secondary | ICD-10-CM | POA: Diagnosis not present

## 2016-06-21 DIAGNOSIS — M5136 Other intervertebral disc degeneration, lumbar region: Secondary | ICD-10-CM | POA: Diagnosis not present

## 2016-06-21 DIAGNOSIS — M5414 Radiculopathy, thoracic region: Secondary | ICD-10-CM | POA: Diagnosis not present

## 2016-06-21 DIAGNOSIS — M25552 Pain in left hip: Secondary | ICD-10-CM | POA: Diagnosis not present

## 2016-06-21 DIAGNOSIS — M9905 Segmental and somatic dysfunction of pelvic region: Secondary | ICD-10-CM | POA: Diagnosis not present

## 2016-06-21 DIAGNOSIS — M9904 Segmental and somatic dysfunction of sacral region: Secondary | ICD-10-CM | POA: Diagnosis not present

## 2016-06-26 DIAGNOSIS — M5136 Other intervertebral disc degeneration, lumbar region: Secondary | ICD-10-CM | POA: Diagnosis not present

## 2016-06-26 DIAGNOSIS — M5414 Radiculopathy, thoracic region: Secondary | ICD-10-CM | POA: Diagnosis not present

## 2016-06-26 DIAGNOSIS — M9903 Segmental and somatic dysfunction of lumbar region: Secondary | ICD-10-CM | POA: Diagnosis not present

## 2016-06-26 DIAGNOSIS — M9904 Segmental and somatic dysfunction of sacral region: Secondary | ICD-10-CM | POA: Diagnosis not present

## 2016-06-26 DIAGNOSIS — M25552 Pain in left hip: Secondary | ICD-10-CM | POA: Diagnosis not present

## 2016-06-26 DIAGNOSIS — M9905 Segmental and somatic dysfunction of pelvic region: Secondary | ICD-10-CM | POA: Diagnosis not present

## 2016-06-28 DIAGNOSIS — M5414 Radiculopathy, thoracic region: Secondary | ICD-10-CM | POA: Diagnosis not present

## 2016-06-28 DIAGNOSIS — M9904 Segmental and somatic dysfunction of sacral region: Secondary | ICD-10-CM | POA: Diagnosis not present

## 2016-06-28 DIAGNOSIS — M25552 Pain in left hip: Secondary | ICD-10-CM | POA: Diagnosis not present

## 2016-06-28 DIAGNOSIS — M9903 Segmental and somatic dysfunction of lumbar region: Secondary | ICD-10-CM | POA: Diagnosis not present

## 2016-06-28 DIAGNOSIS — M5136 Other intervertebral disc degeneration, lumbar region: Secondary | ICD-10-CM | POA: Diagnosis not present

## 2016-06-28 DIAGNOSIS — M9905 Segmental and somatic dysfunction of pelvic region: Secondary | ICD-10-CM | POA: Diagnosis not present

## 2016-07-04 DIAGNOSIS — M9904 Segmental and somatic dysfunction of sacral region: Secondary | ICD-10-CM | POA: Diagnosis not present

## 2016-07-04 DIAGNOSIS — M9903 Segmental and somatic dysfunction of lumbar region: Secondary | ICD-10-CM | POA: Diagnosis not present

## 2016-07-04 DIAGNOSIS — M9905 Segmental and somatic dysfunction of pelvic region: Secondary | ICD-10-CM | POA: Diagnosis not present

## 2016-07-04 DIAGNOSIS — M5414 Radiculopathy, thoracic region: Secondary | ICD-10-CM | POA: Diagnosis not present

## 2016-07-04 DIAGNOSIS — M25552 Pain in left hip: Secondary | ICD-10-CM | POA: Diagnosis not present

## 2016-07-04 DIAGNOSIS — M5136 Other intervertebral disc degeneration, lumbar region: Secondary | ICD-10-CM | POA: Diagnosis not present

## 2016-07-18 DIAGNOSIS — M5136 Other intervertebral disc degeneration, lumbar region: Secondary | ICD-10-CM | POA: Diagnosis not present

## 2016-07-18 DIAGNOSIS — M5414 Radiculopathy, thoracic region: Secondary | ICD-10-CM | POA: Diagnosis not present

## 2016-07-18 DIAGNOSIS — M9905 Segmental and somatic dysfunction of pelvic region: Secondary | ICD-10-CM | POA: Diagnosis not present

## 2016-07-18 DIAGNOSIS — M9903 Segmental and somatic dysfunction of lumbar region: Secondary | ICD-10-CM | POA: Diagnosis not present

## 2016-07-18 DIAGNOSIS — M9904 Segmental and somatic dysfunction of sacral region: Secondary | ICD-10-CM | POA: Diagnosis not present

## 2016-07-18 DIAGNOSIS — M25552 Pain in left hip: Secondary | ICD-10-CM | POA: Diagnosis not present

## 2016-08-06 DIAGNOSIS — C672 Malignant neoplasm of lateral wall of bladder: Secondary | ICD-10-CM | POA: Diagnosis not present

## 2016-10-11 ENCOUNTER — Other Ambulatory Visit: Payer: Self-pay | Admitting: Internal Medicine

## 2016-10-11 DIAGNOSIS — F17200 Nicotine dependence, unspecified, uncomplicated: Secondary | ICD-10-CM

## 2016-10-19 ENCOUNTER — Ambulatory Visit
Admission: RE | Admit: 2016-10-19 | Discharge: 2016-10-19 | Disposition: A | Discharge status: Home / Self Care | Payer: Medicare Other | Source: Ambulatory Visit | Attending: Internal Medicine | Admitting: Internal Medicine

## 2016-10-19 DIAGNOSIS — F17200 Nicotine dependence, unspecified, uncomplicated: Secondary | ICD-10-CM

## 2016-10-19 DIAGNOSIS — Z87891 Personal history of nicotine dependence: Secondary | ICD-10-CM | POA: Diagnosis not present

## 2016-10-23 DIAGNOSIS — H401131 Primary open-angle glaucoma, bilateral, mild stage: Secondary | ICD-10-CM | POA: Diagnosis not present

## 2016-10-31 DIAGNOSIS — H401131 Primary open-angle glaucoma, bilateral, mild stage: Secondary | ICD-10-CM | POA: Diagnosis not present

## 2016-11-09 DIAGNOSIS — I10 Essential (primary) hypertension: Secondary | ICD-10-CM | POA: Diagnosis not present

## 2016-11-09 DIAGNOSIS — Z1389 Encounter for screening for other disorder: Secondary | ICD-10-CM | POA: Diagnosis not present

## 2016-11-09 DIAGNOSIS — G6289 Other specified polyneuropathies: Secondary | ICD-10-CM | POA: Diagnosis not present

## 2016-11-09 DIAGNOSIS — M5380 Other specified dorsopathies, site unspecified: Secondary | ICD-10-CM | POA: Diagnosis not present

## 2016-11-09 DIAGNOSIS — E119 Type 2 diabetes mellitus without complications: Secondary | ICD-10-CM | POA: Diagnosis not present

## 2016-11-09 DIAGNOSIS — Z6841 Body Mass Index (BMI) 40.0 and over, adult: Secondary | ICD-10-CM | POA: Diagnosis not present

## 2016-11-09 DIAGNOSIS — R05 Cough: Secondary | ICD-10-CM | POA: Diagnosis not present

## 2016-11-12 DIAGNOSIS — H401131 Primary open-angle glaucoma, bilateral, mild stage: Secondary | ICD-10-CM | POA: Diagnosis not present

## 2016-11-28 DIAGNOSIS — H401132 Primary open-angle glaucoma, bilateral, moderate stage: Secondary | ICD-10-CM | POA: Diagnosis not present

## 2016-12-10 DIAGNOSIS — H6980 Other specified disorders of Eustachian tube, unspecified ear: Secondary | ICD-10-CM | POA: Diagnosis not present

## 2016-12-10 DIAGNOSIS — Z6841 Body Mass Index (BMI) 40.0 and over, adult: Secondary | ICD-10-CM | POA: Diagnosis not present

## 2016-12-10 DIAGNOSIS — H6093 Unspecified otitis externa, bilateral: Secondary | ICD-10-CM | POA: Diagnosis not present

## 2016-12-10 DIAGNOSIS — Z23 Encounter for immunization: Secondary | ICD-10-CM | POA: Diagnosis not present

## 2017-01-09 DIAGNOSIS — M5414 Radiculopathy, thoracic region: Secondary | ICD-10-CM | POA: Diagnosis not present

## 2017-01-09 DIAGNOSIS — M9903 Segmental and somatic dysfunction of lumbar region: Secondary | ICD-10-CM | POA: Diagnosis not present

## 2017-01-09 DIAGNOSIS — M9905 Segmental and somatic dysfunction of pelvic region: Secondary | ICD-10-CM | POA: Diagnosis not present

## 2017-01-09 DIAGNOSIS — M25559 Pain in unspecified hip: Secondary | ICD-10-CM | POA: Diagnosis not present

## 2017-01-09 DIAGNOSIS — M5136 Other intervertebral disc degeneration, lumbar region: Secondary | ICD-10-CM | POA: Diagnosis not present

## 2017-01-09 DIAGNOSIS — M9904 Segmental and somatic dysfunction of sacral region: Secondary | ICD-10-CM | POA: Diagnosis not present

## 2017-01-15 DIAGNOSIS — I10 Essential (primary) hypertension: Secondary | ICD-10-CM | POA: Diagnosis not present

## 2017-01-15 DIAGNOSIS — E7849 Other hyperlipidemia: Secondary | ICD-10-CM | POA: Diagnosis not present

## 2017-01-15 DIAGNOSIS — Z0000 Encounter for general adult medical examination without abnormal findings: Secondary | ICD-10-CM | POA: Diagnosis not present

## 2017-01-15 DIAGNOSIS — Z125 Encounter for screening for malignant neoplasm of prostate: Secondary | ICD-10-CM | POA: Diagnosis not present

## 2017-01-15 DIAGNOSIS — E119 Type 2 diabetes mellitus without complications: Secondary | ICD-10-CM | POA: Diagnosis not present

## 2017-01-22 DIAGNOSIS — I10 Essential (primary) hypertension: Secondary | ICD-10-CM | POA: Diagnosis not present

## 2017-01-22 DIAGNOSIS — M19072 Primary osteoarthritis, left ankle and foot: Secondary | ICD-10-CM | POA: Diagnosis not present

## 2017-01-22 DIAGNOSIS — R918 Other nonspecific abnormal finding of lung field: Secondary | ICD-10-CM | POA: Diagnosis not present

## 2017-01-22 DIAGNOSIS — Z1389 Encounter for screening for other disorder: Secondary | ICD-10-CM | POA: Diagnosis not present

## 2017-01-22 DIAGNOSIS — Q600 Renal agenesis, unilateral: Secondary | ICD-10-CM | POA: Diagnosis not present

## 2017-01-22 DIAGNOSIS — G6289 Other specified polyneuropathies: Secondary | ICD-10-CM | POA: Diagnosis not present

## 2017-01-22 DIAGNOSIS — C679 Malignant neoplasm of bladder, unspecified: Secondary | ICD-10-CM | POA: Diagnosis not present

## 2017-01-22 DIAGNOSIS — Z0000 Encounter for general adult medical examination without abnormal findings: Secondary | ICD-10-CM | POA: Diagnosis not present

## 2017-01-22 DIAGNOSIS — Z6841 Body Mass Index (BMI) 40.0 and over, adult: Secondary | ICD-10-CM | POA: Diagnosis not present

## 2017-01-22 DIAGNOSIS — E119 Type 2 diabetes mellitus without complications: Secondary | ICD-10-CM | POA: Diagnosis not present

## 2017-01-22 DIAGNOSIS — H9193 Unspecified hearing loss, bilateral: Secondary | ICD-10-CM | POA: Diagnosis not present

## 2017-01-22 DIAGNOSIS — Q688 Other specified congenital musculoskeletal deformities: Secondary | ICD-10-CM | POA: Diagnosis not present

## 2017-02-04 DIAGNOSIS — Z1212 Encounter for screening for malignant neoplasm of rectum: Secondary | ICD-10-CM | POA: Diagnosis not present

## 2017-03-06 DIAGNOSIS — M25559 Pain in unspecified hip: Secondary | ICD-10-CM | POA: Diagnosis not present

## 2017-03-06 DIAGNOSIS — M9903 Segmental and somatic dysfunction of lumbar region: Secondary | ICD-10-CM | POA: Diagnosis not present

## 2017-03-06 DIAGNOSIS — M9904 Segmental and somatic dysfunction of sacral region: Secondary | ICD-10-CM | POA: Diagnosis not present

## 2017-03-06 DIAGNOSIS — M5414 Radiculopathy, thoracic region: Secondary | ICD-10-CM | POA: Diagnosis not present

## 2017-03-06 DIAGNOSIS — M5136 Other intervertebral disc degeneration, lumbar region: Secondary | ICD-10-CM | POA: Diagnosis not present

## 2017-03-06 DIAGNOSIS — M9905 Segmental and somatic dysfunction of pelvic region: Secondary | ICD-10-CM | POA: Diagnosis not present

## 2017-03-13 DIAGNOSIS — L821 Other seborrheic keratosis: Secondary | ICD-10-CM | POA: Diagnosis not present

## 2017-03-13 DIAGNOSIS — D1801 Hemangioma of skin and subcutaneous tissue: Secondary | ICD-10-CM | POA: Diagnosis not present

## 2017-03-13 DIAGNOSIS — L853 Xerosis cutis: Secondary | ICD-10-CM | POA: Diagnosis not present

## 2017-03-13 DIAGNOSIS — L570 Actinic keratosis: Secondary | ICD-10-CM | POA: Diagnosis not present

## 2017-03-13 DIAGNOSIS — Z85828 Personal history of other malignant neoplasm of skin: Secondary | ICD-10-CM | POA: Diagnosis not present

## 2017-03-13 DIAGNOSIS — D225 Melanocytic nevi of trunk: Secondary | ICD-10-CM | POA: Diagnosis not present

## 2017-04-03 DIAGNOSIS — M5136 Other intervertebral disc degeneration, lumbar region: Secondary | ICD-10-CM | POA: Diagnosis not present

## 2017-04-03 DIAGNOSIS — M9905 Segmental and somatic dysfunction of pelvic region: Secondary | ICD-10-CM | POA: Diagnosis not present

## 2017-04-03 DIAGNOSIS — M5414 Radiculopathy, thoracic region: Secondary | ICD-10-CM | POA: Diagnosis not present

## 2017-04-03 DIAGNOSIS — M9904 Segmental and somatic dysfunction of sacral region: Secondary | ICD-10-CM | POA: Diagnosis not present

## 2017-04-03 DIAGNOSIS — M25559 Pain in unspecified hip: Secondary | ICD-10-CM | POA: Diagnosis not present

## 2017-04-03 DIAGNOSIS — M9903 Segmental and somatic dysfunction of lumbar region: Secondary | ICD-10-CM | POA: Diagnosis not present

## 2017-05-21 DIAGNOSIS — R918 Other nonspecific abnormal finding of lung field: Secondary | ICD-10-CM | POA: Diagnosis not present

## 2017-05-21 DIAGNOSIS — E119 Type 2 diabetes mellitus without complications: Secondary | ICD-10-CM | POA: Diagnosis not present

## 2017-05-21 DIAGNOSIS — I10 Essential (primary) hypertension: Secondary | ICD-10-CM | POA: Diagnosis not present

## 2017-05-21 DIAGNOSIS — J449 Chronic obstructive pulmonary disease, unspecified: Secondary | ICD-10-CM | POA: Diagnosis not present

## 2017-05-21 DIAGNOSIS — Z6841 Body Mass Index (BMI) 40.0 and over, adult: Secondary | ICD-10-CM | POA: Diagnosis not present

## 2017-05-21 DIAGNOSIS — M545 Low back pain: Secondary | ICD-10-CM | POA: Diagnosis not present

## 2017-06-13 DIAGNOSIS — H2513 Age-related nuclear cataract, bilateral: Secondary | ICD-10-CM | POA: Diagnosis not present

## 2017-06-13 DIAGNOSIS — H401131 Primary open-angle glaucoma, bilateral, mild stage: Secondary | ICD-10-CM | POA: Diagnosis not present

## 2017-08-07 DIAGNOSIS — N281 Cyst of kidney, acquired: Secondary | ICD-10-CM | POA: Insufficient documentation

## 2017-08-07 DIAGNOSIS — C44222 Squamous cell carcinoma of skin of right ear and external auricular canal: Secondary | ICD-10-CM | POA: Diagnosis not present

## 2017-08-09 DIAGNOSIS — Z8551 Personal history of malignant neoplasm of bladder: Secondary | ICD-10-CM | POA: Diagnosis not present

## 2017-08-09 DIAGNOSIS — N138 Other obstructive and reflux uropathy: Secondary | ICD-10-CM | POA: Diagnosis not present

## 2017-08-09 DIAGNOSIS — N401 Enlarged prostate with lower urinary tract symptoms: Secondary | ICD-10-CM | POA: Diagnosis not present

## 2017-08-09 DIAGNOSIS — N281 Cyst of kidney, acquired: Secondary | ICD-10-CM | POA: Diagnosis not present

## 2017-08-14 DIAGNOSIS — C44222 Squamous cell carcinoma of skin of right ear and external auricular canal: Secondary | ICD-10-CM | POA: Diagnosis not present

## 2017-08-14 DIAGNOSIS — Z85828 Personal history of other malignant neoplasm of skin: Secondary | ICD-10-CM | POA: Diagnosis not present

## 2017-10-10 ENCOUNTER — Other Ambulatory Visit: Payer: Self-pay | Admitting: Internal Medicine

## 2017-10-10 DIAGNOSIS — R911 Solitary pulmonary nodule: Secondary | ICD-10-CM

## 2017-10-22 ENCOUNTER — Ambulatory Visit
Admission: RE | Admit: 2017-10-22 | Discharge: 2017-10-22 | Disposition: A | Discharge status: Home / Self Care | Payer: Medicare Other | Source: Ambulatory Visit | Attending: Internal Medicine | Admitting: Internal Medicine

## 2017-10-22 DIAGNOSIS — R911 Solitary pulmonary nodule: Secondary | ICD-10-CM

## 2017-10-22 DIAGNOSIS — R918 Other nonspecific abnormal finding of lung field: Secondary | ICD-10-CM | POA: Diagnosis not present

## 2017-12-07 DIAGNOSIS — Z23 Encounter for immunization: Secondary | ICD-10-CM | POA: Diagnosis not present

## 2017-12-10 DIAGNOSIS — H401132 Primary open-angle glaucoma, bilateral, moderate stage: Secondary | ICD-10-CM | POA: Diagnosis not present

## 2018-02-18 DIAGNOSIS — H6123 Impacted cerumen, bilateral: Secondary | ICD-10-CM | POA: Diagnosis not present

## 2018-02-19 DIAGNOSIS — E7849 Other hyperlipidemia: Secondary | ICD-10-CM | POA: Diagnosis not present

## 2018-02-19 DIAGNOSIS — I10 Essential (primary) hypertension: Secondary | ICD-10-CM | POA: Diagnosis not present

## 2018-02-19 DIAGNOSIS — E119 Type 2 diabetes mellitus without complications: Secondary | ICD-10-CM | POA: Diagnosis not present

## 2018-02-19 DIAGNOSIS — R82998 Other abnormal findings in urine: Secondary | ICD-10-CM | POA: Diagnosis not present

## 2018-02-19 DIAGNOSIS — Z125 Encounter for screening for malignant neoplasm of prostate: Secondary | ICD-10-CM | POA: Diagnosis not present

## 2018-02-26 DIAGNOSIS — R0789 Other chest pain: Secondary | ICD-10-CM | POA: Diagnosis not present

## 2018-02-26 DIAGNOSIS — G6289 Other specified polyneuropathies: Secondary | ICD-10-CM | POA: Diagnosis not present

## 2018-02-26 DIAGNOSIS — J449 Chronic obstructive pulmonary disease, unspecified: Secondary | ICD-10-CM | POA: Diagnosis not present

## 2018-02-26 DIAGNOSIS — C679 Malignant neoplasm of bladder, unspecified: Secondary | ICD-10-CM | POA: Diagnosis not present

## 2018-02-26 DIAGNOSIS — Q600 Renal agenesis, unilateral: Secondary | ICD-10-CM | POA: Diagnosis not present

## 2018-02-26 DIAGNOSIS — L729 Follicular cyst of the skin and subcutaneous tissue, unspecified: Secondary | ICD-10-CM | POA: Diagnosis not present

## 2018-02-26 DIAGNOSIS — E119 Type 2 diabetes mellitus without complications: Secondary | ICD-10-CM | POA: Diagnosis not present

## 2018-02-26 DIAGNOSIS — Q688 Other specified congenital musculoskeletal deformities: Secondary | ICD-10-CM | POA: Diagnosis not present

## 2018-02-26 DIAGNOSIS — Z0000 Encounter for general adult medical examination without abnormal findings: Secondary | ICD-10-CM | POA: Diagnosis not present

## 2018-02-26 DIAGNOSIS — R918 Other nonspecific abnormal finding of lung field: Secondary | ICD-10-CM | POA: Diagnosis not present

## 2018-02-26 DIAGNOSIS — Z6841 Body Mass Index (BMI) 40.0 and over, adult: Secondary | ICD-10-CM | POA: Diagnosis not present

## 2018-02-26 DIAGNOSIS — Z1389 Encounter for screening for other disorder: Secondary | ICD-10-CM | POA: Diagnosis not present

## 2018-03-04 DIAGNOSIS — L03032 Cellulitis of left toe: Secondary | ICD-10-CM | POA: Diagnosis not present

## 2018-03-04 DIAGNOSIS — Z6841 Body Mass Index (BMI) 40.0 and over, adult: Secondary | ICD-10-CM | POA: Diagnosis not present

## 2018-03-07 DIAGNOSIS — Z6841 Body Mass Index (BMI) 40.0 and over, adult: Secondary | ICD-10-CM | POA: Diagnosis not present

## 2018-03-07 DIAGNOSIS — L600 Ingrowing nail: Secondary | ICD-10-CM | POA: Diagnosis not present

## 2018-03-07 DIAGNOSIS — L03039 Cellulitis of unspecified toe: Secondary | ICD-10-CM | POA: Diagnosis not present

## 2018-03-07 DIAGNOSIS — L03032 Cellulitis of left toe: Secondary | ICD-10-CM | POA: Diagnosis not present

## 2018-03-10 ENCOUNTER — Ambulatory Visit: Payer: Medicare Other | Admitting: Podiatry

## 2018-03-10 DIAGNOSIS — Z6841 Body Mass Index (BMI) 40.0 and over, adult: Secondary | ICD-10-CM | POA: Diagnosis not present

## 2018-03-10 DIAGNOSIS — S39012S Strain of muscle, fascia and tendon of lower back, sequela: Secondary | ICD-10-CM | POA: Diagnosis not present

## 2018-03-10 DIAGNOSIS — L600 Ingrowing nail: Secondary | ICD-10-CM | POA: Diagnosis not present

## 2018-03-10 DIAGNOSIS — L03032 Cellulitis of left toe: Secondary | ICD-10-CM | POA: Diagnosis not present

## 2018-03-19 DIAGNOSIS — L821 Other seborrheic keratosis: Secondary | ICD-10-CM | POA: Diagnosis not present

## 2018-03-19 DIAGNOSIS — C44529 Squamous cell carcinoma of skin of other part of trunk: Secondary | ICD-10-CM | POA: Diagnosis not present

## 2018-03-19 DIAGNOSIS — D225 Melanocytic nevi of trunk: Secondary | ICD-10-CM | POA: Diagnosis not present

## 2018-03-19 DIAGNOSIS — D2239 Melanocytic nevi of other parts of face: Secondary | ICD-10-CM | POA: Diagnosis not present

## 2018-03-19 DIAGNOSIS — L570 Actinic keratosis: Secondary | ICD-10-CM | POA: Diagnosis not present

## 2018-03-19 DIAGNOSIS — Z85828 Personal history of other malignant neoplasm of skin: Secondary | ICD-10-CM | POA: Diagnosis not present

## 2018-04-07 ENCOUNTER — Ambulatory Visit (INDEPENDENT_AMBULATORY_CARE_PROVIDER_SITE_OTHER): Payer: Medicare Other | Admitting: Podiatry

## 2018-04-07 ENCOUNTER — Encounter: Payer: Self-pay | Admitting: Podiatry

## 2018-04-07 ENCOUNTER — Ambulatory Visit (INDEPENDENT_AMBULATORY_CARE_PROVIDER_SITE_OTHER): Payer: Medicare Other

## 2018-04-07 ENCOUNTER — Other Ambulatory Visit: Payer: Self-pay | Admitting: Podiatry

## 2018-04-07 VITALS — BP 138/76 | HR 61 | Resp 16

## 2018-04-07 DIAGNOSIS — L600 Ingrowing nail: Secondary | ICD-10-CM

## 2018-04-07 DIAGNOSIS — M779 Enthesopathy, unspecified: Secondary | ICD-10-CM

## 2018-04-07 DIAGNOSIS — M79675 Pain in left toe(s): Secondary | ICD-10-CM | POA: Diagnosis not present

## 2018-04-07 DIAGNOSIS — B351 Tinea unguium: Secondary | ICD-10-CM | POA: Diagnosis not present

## 2018-04-07 DIAGNOSIS — M79672 Pain in left foot: Secondary | ICD-10-CM

## 2018-04-07 DIAGNOSIS — M79671 Pain in right foot: Secondary | ICD-10-CM | POA: Diagnosis not present

## 2018-04-07 DIAGNOSIS — M79674 Pain in right toe(s): Secondary | ICD-10-CM | POA: Diagnosis not present

## 2018-04-07 NOTE — Progress Notes (Signed)
   Subjective:    Patient ID: Justin Mango., male    DOB: [birth year], 78 y.o.   MRN: [id number]  HPI    Review of Systems  All other systems reviewed and are negative.      Objective:   Physical Exam        Assessment & Plan:

## 2018-04-08 DIAGNOSIS — R262 Difficulty in walking, not elsewhere classified: Secondary | ICD-10-CM | POA: Diagnosis not present

## 2018-04-08 DIAGNOSIS — M25562 Pain in left knee: Secondary | ICD-10-CM | POA: Diagnosis not present

## 2018-04-08 DIAGNOSIS — M25561 Pain in right knee: Secondary | ICD-10-CM | POA: Diagnosis not present

## 2018-04-08 DIAGNOSIS — M545 Low back pain: Secondary | ICD-10-CM | POA: Diagnosis not present

## 2018-04-10 DIAGNOSIS — R262 Difficulty in walking, not elsewhere classified: Secondary | ICD-10-CM | POA: Diagnosis not present

## 2018-04-10 DIAGNOSIS — M25562 Pain in left knee: Secondary | ICD-10-CM | POA: Diagnosis not present

## 2018-04-10 DIAGNOSIS — M545 Low back pain: Secondary | ICD-10-CM | POA: Diagnosis not present

## 2018-04-10 DIAGNOSIS — M25561 Pain in right knee: Secondary | ICD-10-CM | POA: Diagnosis not present

## 2018-04-14 NOTE — Progress Notes (Signed)
Subjective:   Patient ID: Justin Atkinson., male   DOB: 78 y.o.   MRN: [id number]   HPI Patient is a long-term diabetic who has nail disease and noted some drainage of the left second nail.  All of his nails bother him and are hard for him to cut and they do get sore and patient does not currently smoke and likes to be active   Review of Systems  All other systems reviewed and are negative.       Objective:  Physical Exam Vitals signs and nursing note reviewed.  Constitutional:      Appearance: He is well-developed.  Pulmonary:     Effort: Pulmonary effort is normal.  Musculoskeletal: Normal range of motion.  Skin:    General: Skin is warm.  Neurological:     Mental Status: He is alert.     Neurovascular status was found to be intact muscle strength was adequate range of motion within normal limits with patient noted to have incurvation of nailbeds 1 through 5 both feet with thick yellow brittle beds noted.  He does have mild diminishment of hair growth but he did have good digital perfusion and he does have pain when I palpated the nailbeds with slight redness of the second nail left foot is localized with no current drainage noted     Assessment:  At risk patient with long-term diabetes with mycotic nail infection 1-5 both feet with pain with pressure     Plan:  H&P conditions and discussion concerning condition with patient.  Today I recommended routine care and I debrided nailbeds 1-5 both feet with no iatrogenic bleeding I recommended this be done on a routine basis.  Also gave education concerning foot inspections on a daily basis

## 2018-04-15 DIAGNOSIS — R262 Difficulty in walking, not elsewhere classified: Secondary | ICD-10-CM | POA: Diagnosis not present

## 2018-04-15 DIAGNOSIS — M25562 Pain in left knee: Secondary | ICD-10-CM | POA: Diagnosis not present

## 2018-04-15 DIAGNOSIS — M25561 Pain in right knee: Secondary | ICD-10-CM | POA: Diagnosis not present

## 2018-04-15 DIAGNOSIS — M545 Low back pain: Secondary | ICD-10-CM | POA: Diagnosis not present

## 2018-04-17 DIAGNOSIS — M545 Low back pain: Secondary | ICD-10-CM | POA: Diagnosis not present

## 2018-04-17 DIAGNOSIS — M25561 Pain in right knee: Secondary | ICD-10-CM | POA: Diagnosis not present

## 2018-04-17 DIAGNOSIS — R262 Difficulty in walking, not elsewhere classified: Secondary | ICD-10-CM | POA: Diagnosis not present

## 2018-04-17 DIAGNOSIS — M25562 Pain in left knee: Secondary | ICD-10-CM | POA: Diagnosis not present

## 2018-04-29 DIAGNOSIS — R262 Difficulty in walking, not elsewhere classified: Secondary | ICD-10-CM | POA: Diagnosis not present

## 2018-04-29 DIAGNOSIS — M25562 Pain in left knee: Secondary | ICD-10-CM | POA: Diagnosis not present

## 2018-04-29 DIAGNOSIS — M25561 Pain in right knee: Secondary | ICD-10-CM | POA: Diagnosis not present

## 2018-04-29 DIAGNOSIS — M545 Low back pain: Secondary | ICD-10-CM | POA: Diagnosis not present

## 2018-05-06 DIAGNOSIS — R262 Difficulty in walking, not elsewhere classified: Secondary | ICD-10-CM | POA: Diagnosis not present

## 2018-05-06 DIAGNOSIS — M545 Low back pain: Secondary | ICD-10-CM | POA: Diagnosis not present

## 2018-05-06 DIAGNOSIS — M25562 Pain in left knee: Secondary | ICD-10-CM | POA: Diagnosis not present

## 2018-05-06 DIAGNOSIS — M25561 Pain in right knee: Secondary | ICD-10-CM | POA: Diagnosis not present

## 2018-06-02 DIAGNOSIS — M25561 Pain in right knee: Secondary | ICD-10-CM | POA: Diagnosis not present

## 2018-06-02 DIAGNOSIS — M25562 Pain in left knee: Secondary | ICD-10-CM | POA: Diagnosis not present

## 2018-06-02 DIAGNOSIS — M545 Low back pain: Secondary | ICD-10-CM | POA: Diagnosis not present

## 2018-06-02 DIAGNOSIS — R262 Difficulty in walking, not elsewhere classified: Secondary | ICD-10-CM | POA: Diagnosis not present

## 2018-06-10 DIAGNOSIS — H401131 Primary open-angle glaucoma, bilateral, mild stage: Secondary | ICD-10-CM | POA: Diagnosis not present

## 2018-06-10 DIAGNOSIS — H2513 Age-related nuclear cataract, bilateral: Secondary | ICD-10-CM | POA: Diagnosis not present

## 2018-06-16 ENCOUNTER — Ambulatory Visit (INDEPENDENT_AMBULATORY_CARE_PROVIDER_SITE_OTHER): Payer: Medicare Other | Admitting: Podiatry

## 2018-06-16 ENCOUNTER — Other Ambulatory Visit: Payer: Self-pay

## 2018-06-16 ENCOUNTER — Encounter: Payer: Self-pay | Admitting: Podiatry

## 2018-06-16 DIAGNOSIS — M79674 Pain in right toe(s): Secondary | ICD-10-CM

## 2018-06-16 DIAGNOSIS — B351 Tinea unguium: Secondary | ICD-10-CM

## 2018-06-16 DIAGNOSIS — M79675 Pain in left toe(s): Secondary | ICD-10-CM | POA: Diagnosis not present

## 2018-06-16 NOTE — Patient Instructions (Signed)

## 2018-06-18 DIAGNOSIS — H401122 Primary open-angle glaucoma, left eye, moderate stage: Secondary | ICD-10-CM | POA: Diagnosis not present

## 2018-06-18 DIAGNOSIS — H401111 Primary open-angle glaucoma, right eye, mild stage: Secondary | ICD-10-CM | POA: Diagnosis not present

## 2018-06-25 NOTE — Progress Notes (Signed)
Subjective: Justin Atkinson. presents today for preventative diabetic foot care with painful, thick toenails 1-5 b/l that he cannot cut and which interfere with daily activities.  Pain is aggravated when wearing enclosed shoe gear.  Justin Infante, MD is his PCP.   Current Outpatient Medications:  .  ANORO ELLIPTA 62.5-25 MCG/INH AEPB, , Disp: , Rfl:  .  aspirin 81 MG tablet, Take 81 mg by mouth daily., Disp: , Rfl:  .  Brinzolamide-Brimonidine (SIMBRINZA) 1-0.2 % SUSP, Apply to eye., Disp: , Rfl:  .  Cholecalciferol (VITAMIN D-3 PO), Take 1,000 Units by mouth daily., Disp: , Rfl:  .  irbesartan (AVAPRO) 150 MG tablet, , Disp: , Rfl:  .  LUMIGAN 0.01 % SOLN, , Disp: , Rfl:  .  metFORMIN (GLUCOPHAGE-XR) 500 MG 24 hr tablet, , Disp: , Rfl:  .  Multiple Vitamins-Minerals (MULTIVITAMIN PO), Take 1 tablet by mouth daily. , Disp: , Rfl:  .  Omega-3 Fatty Acids (FISH OIL) 1000 MG CAPS, Take by mouth., Disp: , Rfl:  .  pantoprazole (PROTONIX) 40 MG tablet, , Disp: , Rfl:  .  rosuvastatin (CRESTOR) 20 MG tablet, Take 10 mg by mouth daily. , Disp: , Rfl:  .  Fluticasone Furoate-Vilanterol (BREO ELLIPTA) 100-25 MCG/INH AEPB, Inhale 1 puff into the lungs daily., Disp: , Rfl:  .  ibuprofen (ADVIL,MOTRIN) 200 MG tablet, Take 400 mg by mouth at bedtime., Disp: , Rfl:  .  latanoprost (XALATAN) 0.005 % ophthalmic solution, Place 1 drop into both eyes at bedtime., Disp: , Rfl:  .  lisinopril (PRINIVIL,ZESTRIL) 10 MG tablet, Take 10 mg by mouth at bedtime. , Disp: , Rfl:  .  Multiple Vitamins tablet, Take by mouth., Disp: , Rfl:  .  Omega-3 Fatty Acids (FISH OIL) 1200 MG CAPS, Take 1,200 mg by mouth daily. Taking 2 Tablets Daily, Disp: , Rfl:  .  Probiotic Product ([place]) CAPS, Take 1 capsule by mouth daily., Disp: , Rfl:  .  ranitidine (ZANTAC) 150 MG tablet, Take 150 mg by mouth as needed for heartburn., Disp: , Rfl:   No Known Allergies  Objective:  Vascular Examination: Capillary  refill time immediate x 10 digits  Dorsalis pedis and Posterior tibial pulses palpable b/l  Digital hair present x 10 digits  Skin temperature gradient WNL b/l  Dermatological Examination: Skin with normal turgor, texture and tone b/l  Toenails 1-5 b/l discolored, thick, dystrophic with subungual debris and pain with palpation to nailbeds due to thickness of nails.  Musculoskeletal: Muscle strength 5/5 to all LE muscle groups  No gross bony deformities b/l.  No pain, crepitus or joint limitation noted with ROM.   Neurological: Sensation intact with 10 gram monofilament.  Vibratory sensation intact.  Assessment: Painful onychomycosis toenails 1-5 b/l    Plan: 1. Toenails 1-5 b/l were debrided in length and girth without iatrogenic bleeding. 2. Patient to continue soft, supportive shoe gear daily. 3. Patient to report any pedal injuries to medical professional immediately. 4. Follow up 3 months.  5. Patient/POA to call should there be a concern in the interim.

## 2018-08-15 DIAGNOSIS — Z8551 Personal history of malignant neoplasm of bladder: Secondary | ICD-10-CM | POA: Diagnosis not present

## 2018-08-20 DIAGNOSIS — H401122 Primary open-angle glaucoma, left eye, moderate stage: Secondary | ICD-10-CM | POA: Diagnosis not present

## 2018-08-27 DIAGNOSIS — C679 Malignant neoplasm of bladder, unspecified: Secondary | ICD-10-CM | POA: Diagnosis not present

## 2018-08-27 DIAGNOSIS — M545 Low back pain: Secondary | ICD-10-CM | POA: Diagnosis not present

## 2018-08-27 DIAGNOSIS — R918 Other nonspecific abnormal finding of lung field: Secondary | ICD-10-CM | POA: Diagnosis not present

## 2018-08-27 DIAGNOSIS — I10 Essential (primary) hypertension: Secondary | ICD-10-CM | POA: Diagnosis not present

## 2018-08-27 DIAGNOSIS — J449 Chronic obstructive pulmonary disease, unspecified: Secondary | ICD-10-CM | POA: Diagnosis not present

## 2018-08-27 DIAGNOSIS — E119 Type 2 diabetes mellitus without complications: Secondary | ICD-10-CM | POA: Diagnosis not present

## 2018-08-27 DIAGNOSIS — E785 Hyperlipidemia, unspecified: Secondary | ICD-10-CM | POA: Diagnosis not present

## 2018-08-27 DIAGNOSIS — K219 Gastro-esophageal reflux disease without esophagitis: Secondary | ICD-10-CM | POA: Diagnosis not present

## 2018-09-01 DIAGNOSIS — E119 Type 2 diabetes mellitus without complications: Secondary | ICD-10-CM | POA: Diagnosis not present

## 2018-09-03 ENCOUNTER — Other Ambulatory Visit: Payer: Self-pay | Admitting: Internal Medicine

## 2018-09-03 DIAGNOSIS — R911 Solitary pulmonary nodule: Secondary | ICD-10-CM

## 2018-09-10 DIAGNOSIS — Z86010 Personal history of colonic polyps: Secondary | ICD-10-CM | POA: Diagnosis not present

## 2018-09-10 DIAGNOSIS — K5730 Diverticulosis of large intestine without perforation or abscess without bleeding: Secondary | ICD-10-CM | POA: Diagnosis not present

## 2018-09-18 DIAGNOSIS — L821 Other seborrheic keratosis: Secondary | ICD-10-CM | POA: Diagnosis not present

## 2018-09-18 DIAGNOSIS — D0421 Carcinoma in situ of skin of right ear and external auricular canal: Secondary | ICD-10-CM | POA: Diagnosis not present

## 2018-09-18 DIAGNOSIS — L820 Inflamed seborrheic keratosis: Secondary | ICD-10-CM | POA: Diagnosis not present

## 2018-09-18 DIAGNOSIS — D1801 Hemangioma of skin and subcutaneous tissue: Secondary | ICD-10-CM | POA: Diagnosis not present

## 2018-09-18 DIAGNOSIS — Z85828 Personal history of other malignant neoplasm of skin: Secondary | ICD-10-CM | POA: Diagnosis not present

## 2018-09-18 DIAGNOSIS — D225 Melanocytic nevi of trunk: Secondary | ICD-10-CM | POA: Diagnosis not present

## 2018-09-19 ENCOUNTER — Ambulatory Visit (INDEPENDENT_AMBULATORY_CARE_PROVIDER_SITE_OTHER): Payer: Medicare Other | Admitting: Podiatry

## 2018-09-19 ENCOUNTER — Other Ambulatory Visit: Payer: Self-pay

## 2018-09-19 ENCOUNTER — Encounter: Payer: Self-pay | Admitting: Podiatry

## 2018-09-19 VITALS — Temp 97.9°F

## 2018-09-19 DIAGNOSIS — M79675 Pain in left toe(s): Secondary | ICD-10-CM

## 2018-09-19 DIAGNOSIS — M79674 Pain in right toe(s): Secondary | ICD-10-CM | POA: Diagnosis not present

## 2018-09-19 DIAGNOSIS — B351 Tinea unguium: Secondary | ICD-10-CM | POA: Diagnosis not present

## 2018-09-19 DIAGNOSIS — L600 Ingrowing nail: Secondary | ICD-10-CM

## 2018-09-19 NOTE — Patient Instructions (Signed)

## 2018-09-22 ENCOUNTER — Ambulatory Visit
Admission: RE | Admit: 2018-09-22 | Discharge: 2018-09-22 | Disposition: A | Discharge status: Home / Self Care | Payer: Medicare Other | Source: Ambulatory Visit | Attending: Internal Medicine | Admitting: Internal Medicine

## 2018-09-22 ENCOUNTER — Other Ambulatory Visit: Payer: Self-pay

## 2018-09-22 DIAGNOSIS — R911 Solitary pulmonary nodule: Secondary | ICD-10-CM

## 2018-09-22 DIAGNOSIS — J439 Emphysema, unspecified: Secondary | ICD-10-CM | POA: Diagnosis not present

## 2018-09-22 DIAGNOSIS — R918 Other nonspecific abnormal finding of lung field: Secondary | ICD-10-CM | POA: Diagnosis not present

## 2018-09-22 DIAGNOSIS — J8410 Pulmonary fibrosis, unspecified: Secondary | ICD-10-CM | POA: Diagnosis not present

## 2018-09-26 DIAGNOSIS — D0421 Carcinoma in situ of skin of right ear and external auricular canal: Secondary | ICD-10-CM | POA: Diagnosis not present

## 2018-09-26 DIAGNOSIS — Z85828 Personal history of other malignant neoplasm of skin: Secondary | ICD-10-CM | POA: Diagnosis not present

## 2018-09-30 NOTE — Progress Notes (Signed)
Subjective:  Justin Atkinson. presents to clinic today with cc of  painful, thick, discolored, elongated toenails 1-5 b/l that become tender and cannot cut because of thickness. Pain is aggravated when wearing enclosed shoe gear.  He states both big toes are getting sore. Denies any redness, drainage or swelling.  [name], MD is his PCP. Last visit was 06/16/2018 per patient recall.   Current Outpatient Medications:  .  ANORO ELLIPTA 62.5-25 MCG/INH AEPB, , Disp: , Rfl:  .  aspirin 81 MG tablet, Take 81 mg by mouth daily., Disp: , Rfl:  .  Brinzolamide-Brimonidine (SIMBRINZA) 1-0.2 % SUSP, Apply to eye., Disp: , Rfl:  .  Cholecalciferol (VITAMIN D-3 PO), Take 1,000 Units by mouth daily., Disp: , Rfl:  .  Fluticasone Furoate-Vilanterol (BREO ELLIPTA) 100-25 MCG/INH AEPB, Inhale 1 puff into the lungs daily., Disp: , Rfl:  .  ibuprofen (ADVIL,MOTRIN) 200 MG tablet, Take 400 mg by mouth at bedtime., Disp: , Rfl:  .  irbesartan (AVAPRO) 150 MG tablet, , Disp: , Rfl:  .  ketorolac (ACULAR) 0.5 % ophthalmic solution, , Disp: , Rfl:  .  latanoprost (XALATAN) 0.005 % ophthalmic solution, Place 1 drop into both eyes at bedtime., Disp: , Rfl:  .  lisinopril (PRINIVIL,ZESTRIL) 10 MG tablet, Take 10 mg by mouth at bedtime. , Disp: , Rfl:  .  LUMIGAN 0.01 % SOLN, , Disp: , Rfl:  .  metFORMIN (GLUCOPHAGE-XR) 500 MG 24 hr tablet, , Disp: , Rfl:  .  Multiple Vitamins tablet, Take by mouth., Disp: , Rfl:  .  Multiple Vitamins-Minerals (MULTIVITAMIN PO), Take 1 tablet by mouth daily. , Disp: , Rfl:  .  Omega-3 Fatty Acids (FISH OIL) 1000 MG CAPS, Take by mouth., Disp: , Rfl:  .  Omega-3 Fatty Acids (FISH OIL) 1200 MG CAPS, Take 1,200 mg by mouth daily. Taking 2 Tablets Daily, Disp: , Rfl:  .  pantoprazole (PROTONIX) 40 MG tablet, , Disp: , Rfl:  .  Probiotic Product ([place]) CAPS, Take 1 capsule by mouth daily., Disp: , Rfl:  .  ranitidine (ZANTAC) 150 MG tablet, Take 150 mg by mouth  as needed for heartburn., Disp: , Rfl:  .  rosuvastatin (CRESTOR) 20 MG tablet, Take 10 mg by mouth daily. , Disp: , Rfl:    No Known Allergies   Objective: Vitals:   09/19/18 1500  Temp: 97.9 F (36.6 C)    Physical Examination:  Vascular Examination: Capillary refill time immediate x 10 digits.  Palpable DP/PT pulses b/l.  Digital hair present b/l.  No edema noted b/l.  Skin temperature gradient WNL b/l.  Dermatological Examination: Skin with normal turgor, texture and tone b/l.  No open wounds b/l.  No interdigital macerations noted b/l.  Elongated, thick, discolored brittle toenails with subungual debris and pain on dorsal palpation of nailbeds 1-5 b/l.  Incurvated nailplate b/l great toes with tenderness to palpation. No erythema, no edema, no drainage noted.  Musculoskeletal Examination: Muscle strength 5/5 to all muscle groups b/l.  No pain, crepitus or joint discomfort with active/passive ROM.  Neurological Examination: Sensation intact 5/5 b/l with 10 gram monofilament.  Vibratory sensation intact b/l.  Assessment: Mycotic nail infection with pain 1-5 b/l Ingrown toenails b/l great toes, noninfected  Plan: 1. Toenails 1-5 b/l were debrided in length and girth without iatrogenic laceration.Offending nail borders debrided and curretaged b/l great toes. Borders cleansed with alcohol. Antibiotic ointment applied. No further treatment required by patient. Patient advised to  apply antibiotic ointment to both great toes once daily for one week.  2.  Continue soft, supportive shoe gear daily. 3.  Report any pedal injuries to medical professional. 4.  Follow up 3 months. 5.  Patient/POA to call should there be a question/concern in there interim.

## 2018-10-06 DIAGNOSIS — K5730 Diverticulosis of large intestine without perforation or abscess without bleeding: Secondary | ICD-10-CM | POA: Diagnosis not present

## 2018-10-06 DIAGNOSIS — D123 Benign neoplasm of transverse colon: Secondary | ICD-10-CM | POA: Diagnosis not present

## 2018-10-06 DIAGNOSIS — Z86010 Personal history of colonic polyps: Secondary | ICD-10-CM | POA: Diagnosis not present

## 2018-10-06 DIAGNOSIS — K635 Polyp of colon: Secondary | ICD-10-CM | POA: Diagnosis not present

## 2018-10-08 DIAGNOSIS — D123 Benign neoplasm of transverse colon: Secondary | ICD-10-CM | POA: Diagnosis not present

## 2018-10-08 DIAGNOSIS — K635 Polyp of colon: Secondary | ICD-10-CM | POA: Diagnosis not present

## 2018-10-27 DIAGNOSIS — H401122 Primary open-angle glaucoma, left eye, moderate stage: Secondary | ICD-10-CM | POA: Diagnosis not present

## 2018-10-27 DIAGNOSIS — H401111 Primary open-angle glaucoma, right eye, mild stage: Secondary | ICD-10-CM | POA: Diagnosis not present

## 2018-11-11 ENCOUNTER — Encounter: Payer: Self-pay | Admitting: Pulmonary Disease

## 2018-11-11 ENCOUNTER — Ambulatory Visit (INDEPENDENT_AMBULATORY_CARE_PROVIDER_SITE_OTHER): Payer: Medicare Other | Admitting: Pulmonary Disease

## 2018-11-11 ENCOUNTER — Other Ambulatory Visit: Payer: Medicare Other

## 2018-11-11 ENCOUNTER — Other Ambulatory Visit: Payer: Self-pay

## 2018-11-11 VITALS — BP 124/80 | HR 66 | Temp 97.8°F | Ht 74.0 in | Wt 308.4 lb

## 2018-11-11 DIAGNOSIS — J84112 Idiopathic pulmonary fibrosis: Secondary | ICD-10-CM

## 2018-11-11 NOTE — Patient Instructions (Signed)
We will get some labs today to evaluate the reason for pulmonary fibrosis Schedule you for pulmonary function test Return to clinic in 1 month to reevaluate.

## 2018-11-11 NOTE — Progress Notes (Signed)
Justin Atkinson    [id number]    05-May-1940  Primary Care Physician:Perini, Elta Guadeloupe, MD  Referring Physician: [name], [place],  [place] [address]  Chief complaint: Consult for pulmonary fibrosis  HPI: 78 year old with history of bladder cancer, GERD, emphysema, COPD.  Referred for evaluation of pulmonary fibrosis noted on CT of the chest.  Patient is complains of dyspnea on exertion.  No symptoms at rest.  Denies any cough, sputum production.  Has been told he has COPD but no PFTs on record.  Currently maintained on Anoro inhaler Has significant symptoms of acid reflux for which he takes Tums as needed.  Pets: No pets, birds, farm animals Occupation: Worked as a Engineer, structural and in Presenter, broadcasting Exposures: No known exposures, no mold, hot tub, Jacuzzi, humidifier Smoking history: 40-pack-year smoker.  Quit in 2016 Travel history: Lived in [place] all his life.  Used to travel for his job. Relevant family history: Father had COPD.  He was a smoker.  Outpatient Encounter Medications as of 11/11/2018  Medication Sig  . ANORO ELLIPTA 62.5-25 MCG/INH AEPB Inhale 1 puff into the lungs daily.   Marland Kitchen aspirin 81 MG tablet Take 81 mg by mouth daily.  . Brinzolamide-Brimonidine (SIMBRINZA) 1-0.2 % SUSP Apply 1 drop to eye 2 (two) times daily. In left eye only  . Cholecalciferol (VITAMIN D-3 PO) Take 1,000 Units by mouth daily.  Marland Kitchen ibuprofen (ADVIL,MOTRIN) 200 MG tablet Take 400 mg by mouth at bedtime.  . irbesartan (AVAPRO) 150 MG tablet Take 300 mg by mouth daily.   Marland Kitchen ketorolac (ACULAR) 0.5 % ophthalmic solution   . LUMIGAN 0.01 % SOLN   . metFORMIN (GLUCOPHAGE-XR) 500 MG 24 hr tablet Take 500 mg by mouth 2 (two) times daily.   . Multiple Vitamins tablet Take by mouth.  . Multiple Vitamins-Minerals (MULTIVITAMIN PO) Take 1 tablet by mouth daily.   . Omega-3 Fatty Acids (FISH OIL) 1000 MG CAPS Take by mouth.  . Omega-3 Fatty Acids (FISH OIL) 1200  MG CAPS Take 1,200 mg by mouth daily. Taking 2 Tablets Daily  . pantoprazole (PROTONIX) 40 MG tablet Take 40 mg by mouth daily.   . rosuvastatin (CRESTOR) 20 MG tablet Take 10 mg by mouth daily.   Marland Kitchen latanoprost (XALATAN) 0.005 % ophthalmic solution Place 1 drop into both eyes at bedtime.  . Probiotic Product ([place]) CAPS Take 1 capsule by mouth daily.  . ranitidine (ZANTAC) 150 MG tablet Take 150 mg by mouth as needed for heartburn.  . [DISCONTINUED] Fluticasone Furoate-Vilanterol (BREO ELLIPTA) 100-25 MCG/INH AEPB Inhale 1 puff into the lungs daily.  . [DISCONTINUED] lisinopril (PRINIVIL,ZESTRIL) 10 MG tablet Take 10 mg by mouth at bedtime.    No facility-administered encounter medications on file as of 11/11/2018.     Allergies as of 11/11/2018  . (No Known Allergies)    Past Medical History:  Diagnosis Date  . Bladder cancer [facility]) 2001   Surgery  . Complication of anesthesia 2001   woke up during colonoscopy  . COPD (chronic obstructive pulmonary disease) ([place])    "beginning Emphesma"  . DDD (degenerative disc disease), cervical   . DJD (degenerative joint disease)   . Glaucoma   . HTN (hypertension)   . Hypercholesterolemia   . Kidney anomaly, congenital    "born with one kidney"  . Shortness of breath dyspnea    due to overweight and decreased exercise  Past Surgical History:  Procedure Laterality Date  . BLADDER SURGERY  2001   cystoscopic  . [place]  . COLONOSCOPY W/ POLYPECTOMY    . COLONOSCOPY WITH PROPOFOL N/A 05/04/2015   Procedure: COLONOSCOPY WITH PROPOFOL;  Surgeon: Clarene Essex, MD;  Location: [facility] ENDOSCOPY;  Service: Endoscopy;  Laterality: N/A;  . EYE SURGERY Bilateral    laser for Glaucoma  . HERNIA REPAIR  1960  . INGUINAL HERNIA REPAIR Right 08/01/2012   Procedure: HERNIA REPAIR INGUINAL ADULT;  Surgeon: Earnstine Regal, MD;  Location: [facility] ORS;  Service: General;  Laterality: Right;  . INSERTION OF MESH Right 08/01/2012    Procedure: INSERTION OF MESH;  Surgeon: Earnstine Regal, MD;  Location: [facility] ORS;  Service: General;  Laterality: Right;  . TONSILLECTOMY      Family History  Problem Relation Age of Onset  . Cancer Father        lung    Social History   Socioeconomic History  . Marital status: Married    Spouse name: Not on file  . Number of children: 2  . Years of education: Not on file  . Highest education level: Not on file  Occupational History  . Occupation: Pensions consultant industries  . Occupation: Research officer, trade union  Social Needs  . Financial resource strain: Not on file  . Food insecurity    Worry: Not on file    Inability: Not on file  . Transportation needs    Medical: Not on file    Non-medical: Not on file  Tobacco Use  . Smoking status: Former Smoker    Packs/day: 0.25    Years: 40.00    Pack years: 10.00    Types: Cigarettes, Pipe    Quit date: 12/02/2014    Years since quitting: 3.9  . Smokeless tobacco: Never Used  Substance and Sexual Activity  . Alcohol use: Yes    Alcohol/week: 14.0 standard drinks    Types: 14 Shots of liquor per week    Comment: Scotch and water- 2 a day - somedays less  . Drug use: No  . Sexual activity: Not on file  Lifestyle  . Physical activity    Days per week: Not on file    Minutes per session: Not on file  . Stress: Not on file  Relationships  . Social Herbalist on phone: Not on file    Gets together: Not on file    Attends religious service: Not on file    Active member of club or organization: Not on file    Attends meetings of clubs or organizations: Not on file    Relationship status: Not on file  . Intimate partner violence    Fear of current or ex partner: Not on file    Emotionally abused: Not on file    Physically abused: Not on file    Forced sexual activity: Not on file  Other Topics Concern  . Not on file  Social History Narrative  . Not on file    Review of systems: Review  of Systems  Constitutional: Negative for fever and chills.  HENT: Negative.   Eyes: Negative for blurred vision.  Respiratory: as per HPI  Cardiovascular: Negative for chest pain and palpitations.  Gastrointestinal: Negative for vomiting, diarrhea, blood per rectum. Genitourinary: Negative for dysuria, urgency, frequency and hematuria.  Musculoskeletal: Negative for myalgias, back pain and joint pain.  Skin: Negative for  itching and rash.  Neurological: Negative for dizziness, tremors, focal weakness, seizures and loss of consciousness.  Endo/Heme/Allergies: Negative for environmental allergies.  Psychiatric/Behavioral: Negative for depression, suicidal ideas and hallucinations.  All other systems reviewed and are negative.  Physical Exam: Blood pressure 124/80, pulse 66, temperature 97.8 F (36.6 C), temperature source Oral, height 6\' 2"  (1.88 m), weight (!) 308 lb 6.4 oz (139.9 kg), SpO2 95 %. Gen:      No acute distress HEENT:  EOMI, sclera anicteric Neck:     No masses; no thyromegaly Lungs:    Clear to auscultation bilaterally; normal respiratory effort CV:         Regular rate and rhythm; no murmurs Abd:      + bowel sounds; soft, non-tender; no palpable masses, no distension Ext:    No edema; adequate peripheral perfusion Skin:      Warm and dry; no rash Neuro: alert and oriented x 3 Psych: normal mood and affect  Data Reviewed: Imaging: CT chest 10/29/2013- mild emphysema, coronary atherosclerosis.  Pulmonary nodules CT chest 05/12/2015- pulmonary nodules, emphysema mild reticulation in the lung periphery CT chest 10/19/2016- stable pulmonary nodules, emphysema, reticulation in the peripheries CT chest 10/22/2017- emphysema, scattered pulmonary nodules, reticulation in the periphery CT chest 09/22/2018- mild reticulation with basilar gradient.  Minimal bronchiectasis.  Emphysema.  Stable pulmonary nodules.  No honeycombing.  I have reviewed the images personally.  Assessment:   Evaluation for pulmonary fibrosis CT scan reviewed with mild reticulation that appears to have progressed since 2015.  Could be from chronic reflux, GERD We will evaluate with serologies for connective tissue disease and pulmonary function test  Return to clinic in 2 to 4 weeks for review of tests and plan for next steps.  Plan/Recommendations: - Serologies for CTD - Pulmonary function tests  Marshell Garfinkel MD [facility] 11/11/2018, 3:58 PM  CC: [name], MD

## 2018-11-12 LAB — ANCA SCREEN W REFLEX TITER: ANCA Screen: NEGATIVE

## 2018-11-12 LAB — ANTI-SCLERODERMA ANTIBODY: Scleroderma (Scl-70) (ENA) Antibody, IgG: <1 AI

## 2018-11-14 LAB — HYPERSENSITIVITY PNEUMONITIS
A. Pullulans Abs: NEGATIVE
A.Fumigatus #1 Abs: NEGATIVE
Micropolyspora faeni, IgG: NEGATIVE
Pigeon Serum Abs: NEGATIVE
Thermoact. Saccharii: NEGATIVE
Thermoactinomyces vulgaris, IgG: NEGATIVE

## 2018-11-14 LAB — SPECIMEN STATUS REPORT

## 2018-12-05 DIAGNOSIS — Z23 Encounter for immunization: Secondary | ICD-10-CM | POA: Diagnosis not present

## 2018-12-12 ENCOUNTER — Telehealth: Payer: Self-pay | Admitting: Pulmonary Disease

## 2018-12-12 LAB — MYOMARKER 3 PLUS PROFILE (RDL)

## 2018-12-12 NOTE — Telephone Encounter (Signed)
LMTCB x1 for [facility]

## 2018-12-15 NOTE — Telephone Encounter (Signed)
LMTCB x1 for pt to make him aware that we need to redraw some lab work.

## 2018-12-16 ENCOUNTER — Other Ambulatory Visit: Payer: Medicare Other

## 2018-12-16 ENCOUNTER — Other Ambulatory Visit: Payer: Self-pay

## 2018-12-16 DIAGNOSIS — J84112 Idiopathic pulmonary fibrosis: Secondary | ICD-10-CM

## 2018-12-16 NOTE — Addendum Note (Signed)
Addended by: Valerie Salts on: 12/16/2018 10:29 AM   Modules accepted: Orders

## 2018-12-16 NOTE — Telephone Encounter (Signed)
Spoke with pt. He is aware that he needs to have some more blood drawn. States that he will come this morning to it done. Nothing further was needed.

## 2018-12-17 ENCOUNTER — Other Ambulatory Visit: Payer: Self-pay

## 2018-12-17 ENCOUNTER — Ambulatory Visit (INDEPENDENT_AMBULATORY_CARE_PROVIDER_SITE_OTHER): Payer: Medicare Other | Admitting: Pulmonary Disease

## 2018-12-17 ENCOUNTER — Telehealth: Payer: Self-pay

## 2018-12-17 VITALS — BP 122/76 | HR 61 | Temp 97.8°F | Ht 73.0 in | Wt 303.8 lb

## 2018-12-17 DIAGNOSIS — J849 Interstitial pulmonary disease, unspecified: Secondary | ICD-10-CM | POA: Insufficient documentation

## 2018-12-17 NOTE — Patient Instructions (Signed)
We are following you for a condition called pulmonary fibrosis.  This has slowly progressed since 2015  We are still awaiting the results of some of the blood tests and your pulmonary function test I will follow back with you in a month to discuss next steps. Based on those results our options are to get a lung biopsy or treat you with some medications that can slow down the progression  We will discuss further on return visit

## 2018-12-17 NOTE — Telephone Encounter (Signed)
Hello patient was seen in the office today without PFT. It was ordered on 11/11/18. He is coming back in a month. Can PFT be scheduled before he returns. Thank you

## 2018-12-17 NOTE — Progress Notes (Signed)
Braxon Joice    [id number]    1940/04/19  Primary Care Physician:Perini, Elta Guadeloupe, MD  Referring Physician: [name], [place],  [place] [address]  Chief complaint: Follow up pulmonary fibrosis  HPI: 78 year old with history of bladder cancer, GERD, emphysema, COPD.  Referred for evaluation of pulmonary fibrosis noted on CT of the chest.  Patient is complains of dyspnea on exertion.  No symptoms at rest.  Denies any cough, sputum production.  Has been told he has COPD but no PFTs on record.  Currently maintained on Anoro inhaler Has significant symptoms of acid reflux for which he takes Tums as needed.  Pets: No pets, birds, farm animals Occupation: Worked as a Engineer, structural and in Presenter, broadcasting Exposures: No known exposures, no mold, hot tub, Jacuzzi, humidifier Smoking history: 40-pack-year smoker.  Quit in 2016 Travel history: Lived in [place] all his life.  Used to travel for his job. Relevant family history: Father had COPD.  He was a smoker.  Interim history: We had sent serologies for connective tissue disease at last visit but these had to be redrawn yesterday due to mixup with the lab PFTs ordered and are pending  Patient states that his dyspnea is stable with no issues.  Outpatient Encounter Medications as of 12/17/2018  Medication Sig  . ANORO ELLIPTA 62.5-25 MCG/INH AEPB Inhale 1 puff into the lungs daily.   Marland Kitchen aspirin 81 MG tablet Take 81 mg by mouth daily.  . Brimonidine Tartrate (LUMIFY) 0.025 % SOLN Apply 1 drop to eye daily.  . Brinzolamide-Brimonidine (SIMBRINZA) 1-0.2 % SUSP Apply 1 drop to eye 2 (two) times daily. In left eye only  . Cholecalciferol (VITAMIN D-3 PO) Take 1,000 Units by mouth daily.  Marland Kitchen ibuprofen (ADVIL,MOTRIN) 200 MG tablet Take 400 mg by mouth at bedtime.  . irbesartan (AVAPRO) 150 MG tablet Take 300 mg by mouth daily.   Marland Kitchen LUMIGAN 0.01 % SOLN Place 1 drop into both eyes at bedtime.   . metFORMIN  (GLUCOPHAGE-XR) 500 MG 24 hr tablet Take 500 mg by mouth 2 (two) times daily.   . Multiple Vitamins-Minerals (MULTIVITAMIN PO) Take 1 tablet by mouth daily.   . Omega-3 Fatty Acids (FISH OIL) 1000 MG CAPS Take by mouth.  . pantoprazole (PROTONIX) 40 MG tablet Take 40 mg by mouth daily.   . rosuvastatin (CRESTOR) 20 MG tablet Take 10 mg by mouth daily.   . Probiotic Product ([place]) CAPS Take 1 capsule by mouth daily.  . ranitidine (ZANTAC) 150 MG tablet Take 150 mg by mouth as needed for heartburn.  . [DISCONTINUED] ketorolac (ACULAR) 0.5 % ophthalmic solution   . [DISCONTINUED] latanoprost (XALATAN) 0.005 % ophthalmic solution Place 1 drop into both eyes at bedtime.  . [DISCONTINUED] Multiple Vitamins tablet Take by mouth.  . [DISCONTINUED] Omega-3 Fatty Acids (FISH OIL) 1200 MG CAPS Take 1,200 mg by mouth daily. Taking 2 Tablets Daily   No facility-administered encounter medications on file as of 12/17/2018.    Physical Exam: Blood pressure 122/76, pulse 61, temperature 97.8 F (36.6 C), temperature source Temporal, height 6\' 1"  (1.854 m), weight (!) 303 lb 12.8 oz (137.8 kg), SpO2 95 %. Gen:      No acute distress HEENT:  EOMI, sclera anicteric, left eye conjunctival erythema [patient states this is a side effect of the xalatan eyedrops) Neck:     No masses; no thyromegaly Lungs:    Clear to  auscultation bilaterally; normal respiratory effort CV:         Regular rate and rhythm; no murmurs Abd:      + bowel sounds; soft, non-tender; no palpable masses, no distension Ext:    No edema; adequate peripheral perfusion Skin:      Warm and dry; no rash Neuro: alert and oriented x 3 Psych: normal mood and affect  Data Reviewed: Imaging: CT chest 10/29/2013- mild emphysema, coronary atherosclerosis.  Pulmonary nodules CT chest 05/12/2015- pulmonary nodules, emphysema mild reticulation in the lung periphery CT chest 10/19/2016- stable pulmonary nodules, emphysema, reticulation in  the peripheries CT chest 10/22/2017- emphysema, scattered pulmonary nodules, reticulation in the periphery CT chest 09/22/2018- mild reticulation with basilar gradient.  Minimal bronchiectasis.  Emphysema.  Stable pulmonary nodules.  No honeycombing.  I have reviewed the images personally.  Labs:  CTD serologies- SCL 70, hypersensitivity panel, ANCA screen-negative  Assessment:  Evaluation for pulmonary fibrosis CT scan reviewed with mild reticulation that appears to have progressed since 2015.  Could be from chronic reflux, GERD Still awaiting serologies for connective tissue disease and pulmonary function tests  Discussed options with patient.  Recommended bronchoscopy with ENVISIA classifier for evaluation of UIP fibrosis.  Patient is not sure he wants to proceed We will follow back with him in 4 weeks for review of pending tests and plan for next steps.  Plan/Recommendations: - Serologies for CTD - Pulmonary function tests  Marshell Garfinkel MD [facility] 12/17/2018, 2:32 PM  CC: [name], MD

## 2018-12-18 LAB — ANA,IFA RA DIAG PNL W/RFLX TIT/PATN
Anti Nuclear Antibody (ANA): NEGATIVE
Cyclic Citrullin Peptide Ab: <16 UNITS
Rheumatoid fact SerPl-aCnc: <14 IU/mL (ref <14)

## 2018-12-18 LAB — SJOGREN'S SYNDROME ANTIBODS(SSA + SSB)
SSA (Ro) (ENA) Antibody, IgG: <1 AI
SSB (La) (ENA) Antibody, IgG: <1 AI

## 2018-12-18 NOTE — Telephone Encounter (Signed)
Fwd to front desk staff to schedule pft -pr

## 2018-12-19 ENCOUNTER — Encounter: Payer: Self-pay | Admitting: Podiatry

## 2018-12-19 ENCOUNTER — Ambulatory Visit (INDEPENDENT_AMBULATORY_CARE_PROVIDER_SITE_OTHER): Payer: Medicare Other | Admitting: Podiatry

## 2018-12-19 ENCOUNTER — Other Ambulatory Visit: Payer: Self-pay

## 2018-12-19 DIAGNOSIS — M79675 Pain in left toe(s): Secondary | ICD-10-CM | POA: Diagnosis not present

## 2018-12-19 DIAGNOSIS — B351 Tinea unguium: Secondary | ICD-10-CM | POA: Diagnosis not present

## 2018-12-19 DIAGNOSIS — M79674 Pain in right toe(s): Secondary | ICD-10-CM | POA: Diagnosis not present

## 2018-12-19 NOTE — Patient Instructions (Signed)
Diabetes Mellitus and Foot Care Foot care is an important part of your health, especially when you have diabetes. Diabetes may cause you to have problems because of poor blood flow (circulation) to your feet and legs, which can cause your skin to:  Become thinner and drier.  Break more easily.  Heal more slowly.  Peel and crack. You may also have nerve damage (neuropathy) in your legs and feet, causing decreased feeling in them. This means that you may not notice minor injuries to your feet that could lead to more serious problems. Noticing and addressing any potential problems early is the best way to prevent future foot problems. How to care for your feet Foot hygiene  Wash your feet daily with warm water and mild soap. Do not use hot water. Then, pat your feet and the areas between your toes until they are completely dry. Do not soak your feet as this can dry your skin.  Trim your toenails straight across. Do not dig under them or around the cuticle. File the edges of your nails with an emery board or nail file.  Apply a moisturizing lotion or petroleum jelly to the skin on your feet and to dry, brittle toenails. Use lotion that does not contain alcohol and is unscented. Do not apply lotion between your toes. Shoes and socks  Wear clean socks or stockings every day. Make sure they are not too tight. Do not wear knee-high stockings since they may decrease blood flow to your legs.  Wear shoes that fit properly and have enough cushioning. Always look in your shoes before you put them on to be sure there are no objects inside.  To break in new shoes, wear them for just a few hours a day. This prevents injuries on your feet. Wounds, scrapes, corns, and calluses  Check your feet daily for blisters, cuts, bruises, sores, and redness. If you cannot see the bottom of your feet, use a mirror or ask someone for help.  Do not cut corns or calluses or try to remove them with medicine.  If you  find a minor scrape, cut, or break in the skin on your feet, keep it and the skin around it clean and dry. You may clean these areas with mild soap and water. Do not clean the area with peroxide, alcohol, or iodine.  If you have a wound, scrape, corn, or callus on your foot, look at it several times a day to make sure it is healing and not infected. Check for: ? Redness, swelling, or pain. ? Fluid or blood. ? Warmth. ? Pus or a bad smell. General instructions  Do not cross your legs. This may decrease blood flow to your feet.  Do not use heating pads or hot water bottles on your feet. They may burn your skin. If you have lost feeling in your feet or legs, you may not know this is happening until it is too late.  Protect your feet from hot and cold by wearing shoes, such as at the beach or on hot pavement.  Schedule a complete foot exam at least once a year (annually) or more often if you have foot problems. If you have foot problems, report any cuts, sores, or bruises to your health care provider immediately. Contact a health care provider if:  You have a medical condition that increases your risk of infection and you have any cuts, sores, or bruises on your feet.  You have an injury that is not   healing.  You have redness on your legs or feet.  You feel burning or tingling in your legs or feet.  You have pain or cramps in your legs and feet.  Your legs or feet are numb.  Your feet always feel cold.  You have pain around a toenail. Get help right away if:  You have a wound, scrape, corn, or callus on your foot and: ? You have pain, swelling, or redness that gets worse. ? You have fluid or blood coming from the wound, scrape, corn, or callus. ? Your wound, scrape, corn, or callus feels warm to the touch. ? You have pus or a bad smell coming from the wound, scrape, corn, or callus. ? You have a fever. ? You have a red line going up your leg. Summary  Check your feet every day  for cuts, sores, red spots, swelling, and blisters.  Moisturize feet and legs daily.  Wear shoes that fit properly and have enough cushioning.  If you have foot problems, report any cuts, sores, or bruises to your health care provider immediately.  Schedule a complete foot exam at least once a year (annually) or more often if you have foot problems. This information is not intended to replace advice given to you by your health care provider. Make sure you discuss any questions you have with your health care provider. Document Released: 03/16/2000 Document Revised: 05/01/2017 Document Reviewed: 04/20/2016 Elsevier Patient Education  2020 Elsevier Inc.   Onychomycosis/Fungal Toenails  WHAT IS IT? An infection that lies within the keratin of your nail plate that is caused by a fungus.  WHY ME? Fungal infections affect all ages, sexes, races, and creeds.  There may be many factors that predispose you to a fungal infection such as age, coexisting medical conditions such as diabetes, or an autoimmune disease; stress, medications, fatigue, genetics, etc.  Bottom line: fungus thrives in a warm, moist environment and your shoes offer such a location.  IS IT CONTAGIOUS? Theoretically, yes.  You do not want to share shoes, nail clippers or files with someone who has fungal toenails.  Walking around barefoot in the same room or sleeping in the same bed is unlikely to transfer the organism.  It is important to realize, however, that fungus can spread easily from one nail to the next on the same foot.  HOW DO WE TREAT THIS?  There are several ways to treat this condition.  Treatment may depend on many factors such as age, medications, pregnancy, liver and kidney conditions, etc.  It is best to ask your doctor which options are available to you.  1. No treatment.   Unlike many other medical concerns, you can live with this condition.  However for many people this can be a painful condition and may lead to  ingrown toenails or a bacterial infection.  It is recommended that you keep the nails cut short to help reduce the amount of fungal nail. 2. Topical treatment.  These range from herbal remedies to prescription strength nail lacquers.  About 40-50% effective, topicals require twice daily application for approximately 9 to 12 months or until an entirely new nail has grown out.  The most effective topicals are medical grade medications available through physicians offices. 3. Oral antifungal medications.  With an 80-90% cure rate, the most common oral medication requires 3 to 4 months of therapy and stays in your system for a year as the new nail grows out.  Oral antifungal medications do require   blood work to make sure it is a safe drug for you.  A liver function panel will be performed prior to starting the medication and after the first month of treatment.  It is important to have the blood work performed to avoid any harmful side effects.  In general, this medication safe but blood work is required. 4. Laser Therapy.  This treatment is performed by applying a specialized laser to the affected nail plate.  This therapy is noninvasive, fast, and non-painful.  It is not covered by insurance and is therefore, out of pocket.  The results have been very good with a 80-95% cure rate.  [facility] is the only practice in the area to offer this therapy. 5. Permanent Nail Avulsion.  Removing the entire nail so that a new nail will not grow back. 

## 2018-12-25 NOTE — Progress Notes (Signed)
Subjective: Justin Atkinson. is seen today for follow up painful, elongated, thickened toenails 1-5 b/l feet that he cannot cut. Pain interferes with daily activities. Aggravating factor includes wearing enclosed shoe gear and relieved with periodic debridement.  He voices no new pedal problems on today's visit.  Current Outpatient Medications on File Prior to Visit  Medication Sig  . ANORO ELLIPTA 62.5-25 MCG/INH AEPB Inhale 1 puff into the lungs daily.   . Brimonidine Tartrate (LUMIFY) 0.025 % SOLN Apply 1 drop to eye daily.  . Brinzolamide-Brimonidine (SIMBRINZA) 1-0.2 % SUSP Apply 1 drop to eye 2 (two) times daily. In left eye only  . Cholecalciferol (VITAMIN D-3 PO) Take 1,000 Units by mouth daily.  . irbesartan (AVAPRO) 150 MG tablet Take 300 mg by mouth daily.   Marland Kitchen LUMIGAN 0.01 % SOLN Place 1 drop into both eyes at bedtime.   . metFORMIN (GLUCOPHAGE-XR) 500 MG 24 hr tablet Take 500 mg by mouth 2 (two) times daily.   . Multiple Vitamins-Minerals (MULTIVITAMIN PO) Take 1 tablet by mouth daily.   . Omega-3 Fatty Acids (FISH OIL) 1000 MG CAPS Take by mouth.  . pantoprazole (PROTONIX) 40 MG tablet Take 40 mg by mouth daily.   . rosuvastatin (CRESTOR) 20 MG tablet Take 10 mg by mouth daily.    No current facility-administered medications on file prior to visit.      No Known Allergies   Objective:  Vascular Examination: Capillary refill time immediate x 10 digits.  Dorsalis pedis present b/l.  Posterior tibial pulses present b/l.  Digital hair  present x 10 digits.  Skin temperature gradient WNL b/l.   Dermatological Examination: Skin with normal turgor, texture and tone b/l.  Toenails 1-5 b/l discolored, thick, dystrophic with subungual debris and pain with palpation to nailbeds due to thickness of nails.  Musculoskeletal: Muscle strength 5/5 to all LE muscle groups b/l.  No gross bony deformities b/l.  No pain, crepitus or joint limitation noted with ROM.    Neurological Examination: Protective sensation intact 5/5 with 10 gram monofilament bilaterally.  Epicritic sensation present bilaterally.  Vibratory sensation intact bilaterally.   Assessment: Painful onychomycosis toenails 1-5 b/l   Plan: 1. Toenails 1-5 b/l were debrided in length and girth without iatrogenic bleeding. 2. Patient to continue soft, supportive shoe gear 3. Patient to report any pedal injuries to medical professional immediately. 4. Follow up 3 months.  5. Patient/POA to call should there be a concern in the interim.

## 2019-01-01 ENCOUNTER — Other Ambulatory Visit: Payer: Self-pay | Admitting: Pulmonary Disease

## 2019-01-09 ENCOUNTER — Other Ambulatory Visit (HOSPITAL_COMMUNITY)
Admission: RE | Admit: 2019-01-09 | Discharge: 2019-01-09 | Disposition: A | Discharge status: Home / Self Care | Payer: Medicare Other | Source: Ambulatory Visit | Attending: Pulmonary Disease | Admitting: Pulmonary Disease

## 2019-01-09 DIAGNOSIS — Z01812 Encounter for preprocedural laboratory examination: Secondary | ICD-10-CM | POA: Insufficient documentation

## 2019-01-09 DIAGNOSIS — Z20828 Contact with and (suspected) exposure to other viral communicable diseases: Secondary | ICD-10-CM | POA: Insufficient documentation

## 2019-01-12 LAB — NOVEL CORONAVIRUS, NAA (HOSP ORDER, SEND-OUT TO REF LAB; TAT 18-24 HRS): SARS-CoV-2, NAA: NOT DETECTED

## 2019-01-14 ENCOUNTER — Ambulatory Visit: Payer: Medicare Other | Admitting: Pulmonary Disease

## 2019-01-14 ENCOUNTER — Ambulatory Visit (INDEPENDENT_AMBULATORY_CARE_PROVIDER_SITE_OTHER): Payer: Medicare Other | Admitting: Pulmonary Disease

## 2019-01-14 ENCOUNTER — Other Ambulatory Visit: Payer: Self-pay

## 2019-01-14 ENCOUNTER — Encounter: Payer: Self-pay | Admitting: Pulmonary Disease

## 2019-01-14 VITALS — BP 126/80 | HR 61 | Temp 97.0°F | Ht 72.0 in | Wt 303.0 lb

## 2019-01-14 DIAGNOSIS — J849 Interstitial pulmonary disease, unspecified: Secondary | ICD-10-CM

## 2019-01-14 DIAGNOSIS — I10 Essential (primary) hypertension: Secondary | ICD-10-CM | POA: Diagnosis not present

## 2019-01-14 DIAGNOSIS — J84112 Idiopathic pulmonary fibrosis: Secondary | ICD-10-CM

## 2019-01-14 DIAGNOSIS — R0600 Dyspnea, unspecified: Secondary | ICD-10-CM | POA: Diagnosis not present

## 2019-01-14 LAB — COMPREHENSIVE METABOLIC PANEL
ALT: 43 U/L (ref 0–53)
AST: 32 U/L (ref 0–37)
Albumin: 4.4 g/dL (ref 3.5–5.2)
Alkaline Phosphatase: 83 U/L (ref 39–117)
BUN: 19 mg/dL (ref 6–23)
CO2: 27 mEq/L (ref 19–32)
Calcium: 9.8 mg/dL (ref 8.4–10.5)
Chloride: 103 mEq/L (ref 96–112)
Creatinine, Ser: 0.95 mg/dL (ref 0.40–1.50)
GFR: 76.99 mL/min (ref >60.00)
Glucose, Bld: 126 mg/dL — ABNORMAL HIGH (ref 70–99)
Potassium: 4.2 mEq/L (ref 3.5–5.1)
Sodium: 140 mEq/L (ref 135–145)
Total Bilirubin: 0.6 mg/dL (ref 0.2–1.2)
Total Protein: 7 g/dL (ref 6.0–8.3)

## 2019-01-14 LAB — PULMONARY FUNCTION TEST
DL/VA % pred: 94 %
DL/VA: 3.69 ml/min/mmHg/L
DLCO unc % pred: 76 %
DLCO unc: 20.35 ml/min/mmHg
FEF 25-75 Post: 2.97 L/sec
FEF 25-75 Pre: 2.09 L/sec
FEF2575-%Change-Post: 42 %
FEF2575-%Pred-Post: 125 %
FEF2575-%Pred-Pre: 88 %
FEV1-%Change-Post: 7 %
FEV1-%Pred-Post: 84 %
FEV1-%Pred-Pre: 78 %
FEV1-Post: 2.77 L
FEV1-Pre: 2.57 L
FEV1FVC-%Change-Post: 6 %
FEV1FVC-%Pred-Pre: 106 %
FEV6-%Change-Post: 2 %
FEV6-%Pred-Post: 79 %
FEV6-%Pred-Pre: 77 %
FEV6-Post: 3.37 L
FEV6-Pre: 3.3 L
FEV6FVC-%Change-Post: 1 %
FEV6FVC-%Pred-Post: 106 %
FEV6FVC-%Pred-Pre: 104 %
FVC-%Change-Post: 0 %
FVC-%Pred-Post: 74 %
FVC-%Pred-Pre: 73 %
FVC-Post: 3.37 L
FVC-Pre: 3.34 L
Post FEV1/FVC ratio: 82 %
Post FEV6/FVC ratio: 100 %
Pre FEV1/FVC ratio: 77 %
Pre FEV6/FVC Ratio: 99 %

## 2019-01-14 LAB — CBC WITH DIFFERENTIAL/PLATELET
Basophils Absolute: 0.1 10*3/uL (ref 0.0–0.1)
Basophils Relative: 1.1 % (ref 0.0–3.0)
Eosinophils Absolute: 0.4 10*3/uL (ref 0.0–0.7)
Eosinophils Relative: 4.1 % (ref 0.0–5.0)
HCT: 44.5 % (ref 39.0–52.0)
Hemoglobin: 15 g/dL (ref 13.0–17.0)
Lymphocytes Relative: 16.7 % (ref 12.0–46.0)
Lymphs Abs: 1.6 10*3/uL (ref 0.7–4.0)
MCHC: 33.6 g/dL (ref 30.0–36.0)
MCV: 94.6 fl (ref 78.0–100.0)
Monocytes Absolute: 0.8 10*3/uL (ref 0.1–1.0)
Monocytes Relative: 8.5 % (ref 3.0–12.0)
Neutro Abs: 6.7 10*3/uL (ref 1.4–7.7)
Neutrophils Relative %: 69.6 % (ref 43.0–77.0)
Platelets: 175 10*3/uL (ref 150.0–400.0)
RBC: 4.7 Mil/uL (ref 4.22–5.81)
RDW: 13.6 % (ref 11.5–15.5)
WBC: 9.7 10*3/uL (ref 4.0–10.5)

## 2019-01-14 NOTE — Progress Notes (Signed)
PFT done today. 

## 2019-01-14 NOTE — Patient Instructions (Signed)
We will refer you to cardiothoracic surgery for consideration of surgical lung biopsy for evaluation of interstitial lung disease We will recheck some labs today including comprehensive metabolic panel, CBC and PT/INR and myositis panel I have reviewed your pulmonary function test which show mild-moderate reduction in lung function from the pulmonary fibrosis  Follow-up in 4 weeks.

## 2019-01-14 NOTE — Progress Notes (Signed)
Justin Atkinson    [id number]    1940/08/17  Primary Care Physician:Perini, Elta Guadeloupe, MD  Referring Physician: [name], [place],  [place] [address]  Chief complaint: Follow up pulmonary fibrosis  HPI: 78 year old with history of bladder cancer, GERD, emphysema, COPD.  Referred for evaluation of pulmonary fibrosis noted on CT of the chest.  Patient is complains of dyspnea on exertion.  No symptoms at rest.  Denies any cough, sputum production.  Has been told he has COPD but no PFTs on record.  Currently maintained on Anoro inhaler Has significant symptoms of acid reflux for which he takes Tums as needed.  Pets: No pets, birds, farm animals Occupation: Worked as a Engineer, structural and in [employer] Exposures: No known exposures, no mold, hot tub, Jacuzzi, humidifier Smoking history: 40-pack-year smoker.  Quit in 2016 Travel history: Lived in [place] all his life.  Used to travel for his job. Relevant family history: Father had COPD.  He was a smoker.  Interim history: We had sent serologies for connective tissue disease at last visit but these had to be redrawn yesterday due to mixup with the lab PFTs ordered and are pending  Patient states that his dyspnea is stable with no issues.  Outpatient Encounter Medications as of 01/14/2019  Medication Sig  . ANORO ELLIPTA 62.5-25 MCG/INH AEPB Inhale 1 puff into the lungs daily.   . Brimonidine Tartrate (LUMIFY) 0.025 % SOLN Apply 1 drop to eye daily.  . Brinzolamide-Brimonidine (SIMBRINZA) 1-0.2 % SUSP Apply 1 drop to eye 2 (two) times daily. In left eye only  . Cholecalciferol (VITAMIN D-3 PO) Take 1,000 Units by mouth daily.  . irbesartan (AVAPRO) 150 MG tablet Take 300 mg by mouth daily.   Marland Kitchen LUMIGAN 0.01 % SOLN Place 1 drop into both eyes at bedtime.   . metFORMIN (GLUCOPHAGE-XR) 500 MG 24 hr tablet Take 500 mg by mouth 2 (two) times daily.   . Multiple Vitamins-Minerals (MULTIVITAMIN PO)  Take 1 tablet by mouth daily.   . Omega-3 Fatty Acids (FISH OIL) 1000 MG CAPS Take by mouth.  . pantoprazole (PROTONIX) 40 MG tablet Take 40 mg by mouth daily.   . rosuvastatin (CRESTOR) 20 MG tablet Take 10 mg by mouth daily.    No facility-administered encounter medications on file as of 01/14/2019.    Physical Exam: Blood pressure 122/76, pulse 61, temperature 97.8 F (36.6 C), temperature source Temporal, height 6\' 1"  (1.854 m), weight (!) 303 lb 12.8 oz (137.8 kg), SpO2 95 %. Gen:      No acute distress HEENT:  EOMI, sclera anicteric, left eye conjunctival erythema [patient states this is a side effect of the xalatan eyedrops) Neck:     No masses; no thyromegaly Lungs:    Clear to auscultation bilaterally; normal respiratory effort CV:         Regular rate and rhythm; no murmurs Abd:      + bowel sounds; soft, non-tender; no palpable masses, no distension Ext:    No edema; adequate peripheral perfusion Skin:      Warm and dry; no rash Neuro: alert and oriented x 3 Psych: normal mood and affect  Data Reviewed: Imaging: CT chest 10/29/2013- mild emphysema, coronary atherosclerosis.  Pulmonary nodules CT chest 05/12/2015- pulmonary nodules, emphysema mild reticulation in the lung periphery CT chest 10/19/2016- stable pulmonary nodules, emphysema, reticulation in the peripheries CT chest 10/22/2017- emphysema, scattered pulmonary nodules, reticulation in  the periphery CT chest 09/22/2018- mild reticulation with basilar gradient.  Minimal bronchiectasis.  Emphysema.  Stable pulmonary nodules.  No honeycombing.  I have reviewed the images personally.  PFTs:  01/14/2019 FVC 3.37 [74%], FEV1 2.77 [84%], F/F 82, TLC 4.27 [59%], DLCO 20.35 [76%] Moderate-severe restriction, minimal diffusion defect  Labs:  CTD serologies- SCL 70, hypersensitivity panel, ANCA screen-negative  Assessment:  Evaluation for pulmonary fibrosis CT scan reviewed with mild reticulation that appears to have  progressed since 2015.  Could be from chronic reflux, GERD vs IPF. Serologies for CTD so far are negative.  We are still missing the myositis panel and will resend them today.  Discussed options in detail with pateint including monitoring or going for a diagnosis with bronchoscopy, ENVISIA classifier for evaluation of UIP fibrosis versus surgical lung biopsy.  Reviewed that the bronchoscope has lesser a chance of making the diagnosis.  He wants to get a definitive answer and has elected to proceed with surgical lung biopsy.  I am not sure if his age is contraindication to getting it done but his lung function test suggest that he would be able to tolerate it.  We will have him evaluated by cardiothoracic surgery.  If deemed not a candidate for surgery then we can proceed with bronchoscope.  More then 1/2 the time of the 40 min visit was spent in counseling and/or coordination of care with the patient and family.  Plan/Recommendations: - Myositis panel. - Referral to cardiothoracic surgery.  Marshell Garfinkel MD [facility] 01/14/2019, 12:20 PM  CC: [name], MD

## 2019-01-19 ENCOUNTER — Telehealth: Payer: Self-pay | Admitting: Pulmonary Disease

## 2019-01-19 NOTE — Telephone Encounter (Signed)
Called and spoke w/ pt to let him know is covid test from 01/09/2019 was negative. Pt expressed understanding and states he never received his result before his PFT and office visit. I let him know he would have not been able to have a PFT performed or be seen in the office without a negative covid test. Pt verbalized understanding with no additional questions or concerns. Nothing further needed at this time.

## 2019-01-21 NOTE — Progress Notes (Deleted)
[address]       [place] [address]             [place] Kw[place] Jr. [place] Record [id number] Date of Birth: 1940-07-22  Referring: [name], MD Primary Care: [name], MD Primary Cardiologist: No primary care provider on file.  Chief Complaint:   No chief complaint on file.   History of Present Illness:    Justin Atkinson. 78 y.o. male ***    Smoking Hx: ***   Zubrod Score: At the time of surgery this patient's most appropriate activity status/level should be described as: []     0    Normal activity, no symptoms []     1    Restricted in physical strenuous activity but ambulatory, able to do out light work []     2    Ambulatory and capable of self care, unable to do work activities, up and about               >50 % of waking hours                              []     3    Only limited self care, in bed greater than 50% of waking hours []     4    Completely disabled, no self care, confined to bed or chair []     5    Moribund   Past Medical History:  Diagnosis Date  . Bladder cancer [facility]) 2001   Surgery  . Complication of anesthesia 2001   woke up during colonoscopy  . COPD (chronic obstructive pulmonary disease) ([place])    "beginning Emphesma"  . DDD (degenerative disc disease), cervical   . DJD (degenerative joint disease)   . Glaucoma   . HTN (hypertension)   . Hypercholesterolemia   . Kidney anomaly, congenital    "born with one kidney"  . Shortness of breath dyspnea    due to overweight and decreased exercise    Past Surgical History:  Procedure Laterality Date  . BLADDER SURGERY  2001   cystoscopic  . [place]  . COLONOSCOPY W/ POLYPECTOMY    . COLONOSCOPY WITH PROPOFOL N/A 05/04/2015   Procedure: COLONOSCOPY WITH PROPOFOL;  Surgeon: Clarene Essex, MD;  Location: [facility] ENDOSCOPY;  Service: Endoscopy;  Laterality: N/A;  . EYE SURGERY Bilateral    laser for Glaucoma   . HERNIA REPAIR  1960  . INGUINAL HERNIA REPAIR Right 08/01/2012   Procedure: HERNIA REPAIR INGUINAL ADULT;  Surgeon: Earnstine Regal, MD;  Location: [facility] ORS;  Service: General;  Laterality: Right;  . INSERTION OF MESH Right 08/01/2012   Procedure: INSERTION OF MESH;  Surgeon: Earnstine Regal, MD;  Location: [facility] ORS;  Service: General;  Laterality: Right;  . TONSILLECTOMY      Family History  Problem Relation Age of Onset  . Cancer Father        lung     Social History   Tobacco Use  Smoking Status Former Smoker  . Packs/day: 0.25  . Years: 40.00  . Pack years: 10.00  . Types: Cigarettes, Pipe  . Quit date: 12/02/2014  . Years since quitting: 4.1  Smokeless Tobacco Never Used    Social History  Substance and Sexual Activity  Alcohol Use Yes  . Alcohol/week: 14.0 standard drinks  . Types: 14 Shots of liquor per week   Comment: Scotch and water- 2 a day - somedays less     No Known Allergies  Current Outpatient Medications  Medication Sig Dispense Refill  . ANORO ELLIPTA 62.5-25 MCG/INH AEPB Inhale 1 puff into the lungs daily.     . Brimonidine Tartrate (LUMIFY) 0.025 % SOLN Apply 1 drop to eye daily.    . Brinzolamide-Brimonidine (SIMBRINZA) 1-0.2 % SUSP Apply 1 drop to eye 2 (two) times daily. In left eye only    . Cholecalciferol (VITAMIN D-3 PO) Take 1,000 Units by mouth daily.    . irbesartan (AVAPRO) 150 MG tablet Take 300 mg by mouth daily.     Marland Kitchen LUMIGAN 0.01 % SOLN Place 1 drop into both eyes at bedtime.     . metFORMIN (GLUCOPHAGE-XR) 500 MG 24 hr tablet Take 500 mg by mouth 2 (two) times daily.     . Multiple Vitamins-Minerals (MULTIVITAMIN PO) Take 1 tablet by mouth daily.     . Omega-3 Fatty Acids (FISH OIL) 1000 MG CAPS Take by mouth.    . pantoprazole (PROTONIX) 40 MG tablet Take 40 mg by mouth daily.     . rosuvastatin (CRESTOR) 20 MG tablet Take 10 mg by mouth daily.      No current facility-administered medications for this visit.     ROS   PHYSICAL  EXAMINATION: There were no vitals taken for this visit. Physical Exam  Diagnostic Studies & Laboratory data:     Recent Radiology Findings:   No results found.     I have independently reviewed the above radiology studies  and reviewed the findings with the patient.   Recent Lab Findings: Lab Results  Component Value Date   WBC 9.7 01/14/2019   HGB 15.0 01/14/2019   HCT 44.5 01/14/2019   PLT 175.0 01/14/2019   GLUCOSE 126 (H) 01/14/2019   ALT 43 01/14/2019   AST 32 01/14/2019   NA 140 01/14/2019   K 4.2 01/14/2019   CL 103 01/14/2019   CREATININE 0.95 01/14/2019   BUN 19 01/14/2019   CO2 27 01/14/2019     PFTs: - FVC: 73% - FEV1: 78% -DLCO: 76%  Problem List: ***  Assessment / Plan:   78 yo with concern for interstitial lung disease. Will need repeat imaging since last scan was in June     I  spent {CHL ONC TIME VISIT - [id number] with  the patient face to face and greater then 50% of the time was spent in counseling and coordination of care.    Lajuana Matte 01/21/2019 8:03 AM

## 2019-01-23 ENCOUNTER — Encounter: Payer: Medicare Other | Admitting: Thoracic Surgery (Cardiothoracic Vascular Surgery)

## 2019-01-23 ENCOUNTER — Other Ambulatory Visit: Payer: Self-pay

## 2019-01-23 ENCOUNTER — Other Ambulatory Visit: Payer: Self-pay | Admitting: *Deleted

## 2019-01-23 ENCOUNTER — Institutional Professional Consult (permissible substitution) (INDEPENDENT_AMBULATORY_CARE_PROVIDER_SITE_OTHER): Payer: Medicare Other | Admitting: Thoracic Surgery (Cardiothoracic Vascular Surgery)

## 2019-01-23 ENCOUNTER — Encounter: Payer: Self-pay | Admitting: Thoracic Surgery (Cardiothoracic Vascular Surgery)

## 2019-01-23 ENCOUNTER — Other Ambulatory Visit: Payer: Self-pay | Admitting: Thoracic Surgery (Cardiothoracic Vascular Surgery)

## 2019-01-23 VITALS — BP 148/74 | HR 75 | Temp 97.7°F | Resp 20 | Ht 72.0 in | Wt 300.0 lb

## 2019-01-23 DIAGNOSIS — J849 Interstitial pulmonary disease, unspecified: Secondary | ICD-10-CM

## 2019-01-23 DIAGNOSIS — J8410 Pulmonary fibrosis, unspecified: Secondary | ICD-10-CM | POA: Insufficient documentation

## 2019-01-23 DIAGNOSIS — R079 Chest pain, unspecified: Secondary | ICD-10-CM

## 2019-01-23 NOTE — Progress Notes (Signed)
[name]       ,[place] [address]             [place] [name] [place] Record [id number] Date of Birth: 10-09-1940  Referring: Marshell Garfinkel, MD Primary Care: [name], MD Primary Cardiologist: No primary care provider on file.  Chief Complaint:    Chief Complaint  Patient presents with  . Interstitial Lung Disease    Surgical eval, Chest CT 09/22/18, PFT's 01/14/19    History of Present Illness:    Justin Atkinson. 78 y.o. male referred by Dr.Mannam to discuss surgical options for diagnosis of pulmonary fibrosis.  Over the last several months he has had increasing exertional dyspnea and a worsening cough.  CTS has been consulted to assist in his care.    Smoking Hx: Quit smoking 40 years ago.   Zubrod Score: At the time of surgery this patient's most appropriate activity status/level should be described as: []     0    Normal activity, no symptoms [x]     1    Restricted in physical strenuous activity but ambulatory, able to do out light work []     2    Ambulatory and capable of self care, unable to do work activities, up and about               >50 % of waking hours                              []     3    Only limited self care, in bed greater than 50% of waking hours []     4    Completely disabled, no self care, confined to bed or chair []     5    Moribund   Past Medical History:  Diagnosis Date  . Bladder cancer [facility]) 2001   Surgery  . Complication of anesthesia 2001   woke up during colonoscopy  . COPD (chronic obstructive pulmonary disease) ([place])    "beginning Emphesma"  . DDD (degenerative disc disease), cervical   . DJD (degenerative joint disease)   . Glaucoma   . HTN (hypertension)   . Hypercholesterolemia   . Kidney anomaly, congenital    "born with one kidney"  . Shortness of breath dyspnea    due to overweight and decreased exercise    Past Surgical History:  Procedure  Laterality Date  . BLADDER SURGERY  2001   cystoscopic  . [place]  . COLONOSCOPY W/ POLYPECTOMY    . COLONOSCOPY WITH PROPOFOL N/A 05/04/2015   Procedure: COLONOSCOPY WITH PROPOFOL;  Surgeon: Clarene Essex, MD;  Location: [facility] ENDOSCOPY;  Service: Endoscopy;  Laterality: N/A;  . EYE SURGERY Bilateral    laser for Glaucoma  . HERNIA REPAIR  1960  . INGUINAL HERNIA REPAIR Right 08/01/2012   Procedure: HERNIA REPAIR INGUINAL ADULT;  Surgeon: Earnstine Regal, MD;  Location: [facility] ORS;  Service: General;  Laterality: Right;  . INSERTION OF MESH Right 08/01/2012   Procedure: INSERTION OF MESH;  Surgeon: Earnstine Regal, MD;  Location: [facility] ORS;  Service: General;  Laterality: Right;  . TONSILLECTOMY      Family History  Problem Relation Age of Onset  . Cancer Father  lung     Social History   Tobacco Use  Smoking Status Former Smoker  . Packs/day: 0.25  . Years: 40.00  . Pack years: 10.00  . Types: Cigarettes, Pipe  . Quit date: 12/02/2014  . Years since quitting: 4.1  Smokeless Tobacco Never Used    Social History   Substance and Sexual Activity  Alcohol Use Yes  . Alcohol/week: 14.0 standard drinks  . Types: 14 Shots of liquor per week   Comment: Scotch and water- 2 a day - somedays less     No Known Allergies  Current Outpatient Medications  Medication Sig Dispense Refill  . ANORO ELLIPTA 62.5-25 MCG/INH AEPB Inhale 1 puff into the lungs daily.     . Brimonidine Tartrate (LUMIFY) 0.025 % SOLN Apply 1 drop to eye daily.    . Brinzolamide-Brimonidine (SIMBRINZA) 1-0.2 % SUSP Apply 1 drop to eye 2 (two) times daily. In left eye only    . Cholecalciferol (VITAMIN D-3 PO) Take 1,000 Units by mouth daily.    . irbesartan (AVAPRO) 150 MG tablet Take 300 mg by mouth daily.     Marland Kitchen LUMIGAN 0.01 % SOLN Place 1 drop into both eyes at bedtime.     . metFORMIN (GLUCOPHAGE-XR) 500 MG 24 hr tablet Take 500 mg by mouth 2 (two) times daily.     . Multiple Vitamins-Minerals  (MULTIVITAMIN PO) Take 1 tablet by mouth daily.     . Omega-3 Fatty Acids (FISH OIL) 1000 MG CAPS Take by mouth.    . pantoprazole (PROTONIX) 40 MG tablet Take 40 mg by mouth daily.     . rosuvastatin (CRESTOR) 20 MG tablet Take 10 mg by mouth daily.      No current facility-administered medications for this visit.     Review of Systems  Constitutional: Positive for malaise/fatigue and weight loss. Negative for chills and fever.  Eyes: Positive for redness.  Respiratory: Positive for cough, sputum production and shortness of breath.   Cardiovascular: Negative for chest pain and palpitations.  Gastrointestinal: Negative.   Musculoskeletal: Positive for back pain, joint pain and myalgias.  Skin: Negative.   Neurological: Negative.      PHYSICAL EXAMINATION: BP (!) 148/74   Pulse 75   Temp 97.7 F (36.5 C) (Skin)   Resp 20   Ht 6' (1.829 m)   Wt 300 lb (136.1 kg)   SpO2 90% Comment: RA  BMI 40.69 kg/m  Physical Exam  Constitutional: He is oriented to person, place, and time. He appears well-developed and well-nourished. No distress.  HENT:  Head: Normocephalic and atraumatic.  Eyes:  Left eye conjunctivitis   Neck: Normal range of motion. No tracheal deviation present.  Cardiovascular: Normal rate, regular rhythm and normal heart sounds.  No murmur heard. Respiratory: Effort normal. No respiratory distress.  Distant breath sounds bilaterally  GI: Soft. He exhibits no distension.  Musculoskeletal: Normal range of motion.        General: No edema.  Neurological: He is alert and oriented to person, place, and time.  Skin: Skin is warm and dry. He is not diaphoretic.    Diagnostic Studies & Laboratory data:     Recent Radiology Findings:   No results found.     I have independently reviewed the above radiology studies  and reviewed the findings with the patient.   Recent Lab Findings: Lab Results  Component Value Date   WBC 9.7 01/14/2019   HGB 15.0 01/14/2019    HCT 44.5 01/14/2019  PLT 175.0 01/14/2019   GLUCOSE 126 (H) 01/14/2019   ALT 43 01/14/2019   AST 32 01/14/2019   NA 140 01/14/2019   K 4.2 01/14/2019   CL 103 01/14/2019   CREATININE 0.95 01/14/2019   BUN 19 01/14/2019   CO2 27 01/14/2019     PFTs: - FVC: 73% - FEV1: 78% -DLCO: 76%   Assessment / Plan:   78 year old gentleman with history of pulmonary fibrosis who presents with a discussion with surgical biopsy.  Risk benefits and alternatives were discussed in detail and he has agreed to proceed with a bronchoscopy right VATS wedge resection.  He is tentatively scheduled for November 9.  He will require stress test prior to surgery.  Of note, his wife has multiple sclerosis and he is the main caregiver.  They do have home health aides for her and a family member will be coming to assist him after surgery.  Anesthesia note: He does not require a central line.  This will be performed under intermittent apnea.     I  spent 40 minutes with  the patient face to face and greater then 50% of the time was spent in counseling and coordination of care.    Lajuana Matte 01/23/2019 5:04 PM

## 2019-01-25 LAB — MYOMARKER 3 PLUS PROFILE (RDL)

## 2019-01-26 ENCOUNTER — Encounter: Payer: Self-pay | Admitting: *Deleted

## 2019-01-27 DIAGNOSIS — H04123 Dry eye syndrome of bilateral lacrimal glands: Secondary | ICD-10-CM | POA: Diagnosis not present

## 2019-01-27 DIAGNOSIS — H401122 Primary open-angle glaucoma, left eye, moderate stage: Secondary | ICD-10-CM | POA: Diagnosis not present

## 2019-01-28 ENCOUNTER — Telehealth (HOSPITAL_COMMUNITY): Payer: Self-pay | Admitting: *Deleted

## 2019-01-28 NOTE — Telephone Encounter (Signed)
Patient given detailed instructions per Myocardial Perfusion Study Information Sheet for the test on 02/02/2019 at 1300. Patient notified to arrive 15 minutes early and that it is imperative to arrive on time for appointment to keep from having the test rescheduled.  If you need to cancel or reschedule your appointment, please call the office within 24 hours of your appointment. . Patient verbalized understanding.Justin Atkinson, [name] No my chart

## 2019-02-02 ENCOUNTER — Ambulatory Visit (HOSPITAL_COMMUNITY): Payer: Medicare Other | Attending: Internal Medicine

## 2019-02-02 ENCOUNTER — Other Ambulatory Visit: Payer: Self-pay

## 2019-02-02 DIAGNOSIS — R079 Chest pain, unspecified: Secondary | ICD-10-CM | POA: Diagnosis not present

## 2019-02-02 MED ORDER — TECHNETIUM TC 99M TETROFOSMIN IV KIT
32.7000 | PACK | Freq: Once | INTRAVENOUS | Status: AC | PRN
  Administered 2019-02-02: 32.7 via INTRAVENOUS
  Filled 2019-02-02: qty 33

## 2019-02-02 MED ORDER — REGADENOSON 0.4 MG/5ML IV SOLN
0.4000 mg | Freq: Once | INTRAVENOUS | Status: AC
  Administered 2019-02-02: 0.4 mg via INTRAVENOUS

## 2019-02-03 ENCOUNTER — Ambulatory Visit (HOSPITAL_COMMUNITY): Payer: Medicare Other | Attending: Cardiovascular Disease

## 2019-02-03 LAB — MYOCARDIAL PERFUSION IMAGING
LV dias vol: 156 mL (ref 62–150)
LV sys vol: 75 mL
Peak HR: 69 {beats}/min
Rest HR: 63 {beats}/min
SDS: 1
SRS: 0
SSS: 1
TID: 0.99

## 2019-02-03 MED ORDER — TECHNETIUM TC 99M TETROFOSMIN IV KIT
32.6000 | PACK | Freq: Once | INTRAVENOUS | Status: AC | PRN
  Administered 2019-02-03: 32.6 via INTRAVENOUS
  Filled 2019-02-03: qty 33

## 2019-02-04 NOTE — Pre-Procedure Instructions (Signed)
[place], [place] [address] Phone: [phone] Fax: [phone]      Your procedure is scheduled on Monday November 9th.  Report to [facility] Main Entrance "A" at 0830 A.M., and check in at the Admitting office.  Call this number if you have problems the morning of surgery:  [phone]  Call [phone] if you have any questions prior to your surgery date Monday-Friday 8am-4pm    Remember:  Do not eat or drink after midnight the night before your surgery   Take these medicines the morning of surgery with A SIP OF WATER  ANORO ELLIPTA  Brinzolamide-Brimonidine (SIMBRINZA) pantoprazole (PROTONIX)  rosuvastatin (CRESTOR)  Follow your surgeon's instructions on when to stop Asprin.  If no instructions were given by your surgeon then you will need to call the office to get those instructions.     As of today, STOP taking any Aspirin (unless otherwise instructed by your surgeon), Aleve, Naproxen, Ibuprofen, Motrin, Advil, Goody's, BC's, all herbal medications, fish oil, and all vitamins.   HOW TO MANAGE YOUR DIABETES BEFORE AND AFTER SURGERY  Why is it important to control my blood sugar before and after surgery? . Improving blood sugar levels before and after surgery helps healing and can limit problems. . A way of improving blood sugar control is eating a healthy diet by: o  Eating less sugar and carbohydrates o  Increasing activity/exercise o  Talking with your doctor about reaching your blood sugar goals . High blood sugars (greater than 180 mg/dL) can raise your risk of infections and slow your recovery, so you will need to focus on controlling your diabetes during the weeks before surgery. . Make sure that the doctor who takes care of your diabetes knows about your planned surgery including the date and location.  How do I manage my blood sugar before surgery? . Check your blood  sugar at least 4 times a day, starting 2 days before surgery, to make sure that the level is not too high or low. o Check your blood sugar the morning of your surgery when you wake up and every 2 hours until you get to the Short Stay unit. . If your blood sugar is less than 70 mg/dL, you will need to treat for low blood sugar: o Do not take insulin. o Treat a low blood sugar (less than 70 mg/dL) with  cup of clear juice (cranberry or apple), 4 glucose tablets, OR glucose gel. Recheck blood sugar in 15 minutes after treatment (to make sure it is greater than 70 mg/dL). If your blood sugar is not greater than 70 mg/dL on recheck, call [phone] o  for further instructions. . Report your blood sugar to the short stay nurse when you get to Short Stay.  . If you are admitted to the hospital after surgery: o Your blood sugar will be checked by the staff and you will probably be given insulin after surgery (instead of oral diabetes medicines) to make sure you have good blood sugar levels. o The goal for blood sugar control after surgery is 80-180 mg/dL.     WHAT DO I DO ABOUT MY DIABETES MEDICATION?   Marland Kitchen Do not take oral diabetes medicines (pills):  metFORMIN (GLUCOPHAGE-XR) the morning of surgery.    The Morning of Surgery  Do not wear jewelry, make-up or nail polish.  Do not wear lotions, powders, or perfumes/colognes, or deodorant  Do  not shave 48 hours prior to surgery.  Men may shave face and neck.  Do not bring valuables to the hospital.  [facility] is not responsible for any belongings or valuables.  If you are a smoker, DO NOT Smoke 24 hours prior to surgery IF you wear a CPAP at night please bring your mask, tubing, and machine the morning of surgery   Remember that you must have someone to transport you home after your surgery, and remain with you for 24 hours if you are discharged the same day.   Contacts, glasses, hearing aids, dentures or bridgework may not be worn into  surgery.    Leave your suitcase in the car.  After surgery it may be brought to your room.  For patients admitted to the hospital, discharge time will be determined by your treatment team.  Patients discharged the day of surgery will not be allowed to drive home.    Special instructions:   [facility]- Preparing For Surgery  Before surgery, you can play an important role. Because skin is not sterile, your skin needs to be as free of germs as possible. You can reduce the number of germs on your skin by washing with CHG (chlorahexidine gluconate) Soap before surgery.  CHG is an antiseptic cleaner which kills germs and bonds with the skin to continue killing germs even after washing.    Oral Hygiene is also important to reduce your risk of infection.  Remember - BRUSH YOUR TEETH THE MORNING OF SURGERY WITH YOUR REGULAR TOOTHPASTE  Please do not use if you have an allergy to CHG or antibacterial soaps. If your skin becomes reddened/irritated stop using the CHG.  Do not shave (including legs and underarms) for at least 48 hours prior to first CHG shower. It is OK to shave your face.  Please follow these instructions carefully.   1. Shower the NIGHT BEFORE SURGERY and the MORNING OF SURGERY with CHG Soap.   2. If you chose to wash your hair, wash your hair first as usual with your normal shampoo.  3. After you shampoo, rinse your hair and body thoroughly to remove the shampoo.  4. Use CHG as you would any other liquid soap. You can apply CHG directly to the skin and wash gently with a scrungie or a clean washcloth.   5. Apply the CHG Soap to your body ONLY FROM THE NECK DOWN.  Do not use on open wounds or open sores. Avoid contact with your eyes, ears, mouth and genitals (private parts). Wash Face and genitals (private parts)  with your normal soap.   6. Wash thoroughly, paying special attention to the area where your surgery will be performed.  7. Thoroughly rinse your body with warm  water from the neck down.  8. DO NOT shower/wash with your normal soap after using and rinsing off the CHG Soap.  9. Pat yourself dry with a CLEAN TOWEL.  10. Wear CLEAN PAJAMAS to bed the night before surgery, wear comfortable clothes the morning of surgery  11. Place CLEAN SHEETS on your bed the night of your first shower and DO NOT SLEEP WITH PETS.    Day of Surgery:  Do not apply any deodorants/lotions. Please shower the morning of surgery with the CHG soap  Please wear clean clothes to the hospital/surgery center.   Remember to brush your teeth WITH YOUR REGULAR TOOTHPASTE.   Please read over the following fact sheets that you were given.

## 2019-02-05 ENCOUNTER — Encounter (HOSPITAL_COMMUNITY)
Admission: RE | Admit: 2019-02-05 | Discharge: 2019-02-05 | Disposition: A | Discharge status: Home / Self Care | Payer: Medicare Other | Source: Ambulatory Visit | Attending: Thoracic Surgery (Cardiothoracic Vascular Surgery) | Admitting: Thoracic Surgery (Cardiothoracic Vascular Surgery)

## 2019-02-05 ENCOUNTER — Other Ambulatory Visit: Payer: Self-pay

## 2019-02-05 ENCOUNTER — Other Ambulatory Visit (HOSPITAL_COMMUNITY)
Admission: RE | Admit: 2019-02-05 | Discharge: 2019-02-05 | Disposition: A | Discharge status: Home / Self Care | Payer: Medicare Other | Source: Ambulatory Visit

## 2019-02-05 ENCOUNTER — Encounter (HOSPITAL_COMMUNITY): Payer: Self-pay

## 2019-02-05 DIAGNOSIS — E119 Type 2 diabetes mellitus without complications: Secondary | ICD-10-CM | POA: Insufficient documentation

## 2019-02-05 DIAGNOSIS — Z20828 Contact with and (suspected) exposure to other viral communicable diseases: Secondary | ICD-10-CM | POA: Diagnosis not present

## 2019-02-05 DIAGNOSIS — R9431 Abnormal electrocardiogram [ECG] [EKG]: Secondary | ICD-10-CM | POA: Diagnosis not present

## 2019-02-05 DIAGNOSIS — J8410 Pulmonary fibrosis, unspecified: Secondary | ICD-10-CM

## 2019-02-05 DIAGNOSIS — I10 Essential (primary) hypertension: Secondary | ICD-10-CM | POA: Insufficient documentation

## 2019-02-05 DIAGNOSIS — Z01818 Encounter for other preprocedural examination: Secondary | ICD-10-CM | POA: Diagnosis not present

## 2019-02-05 HISTORY — DX: Type 2 diabetes mellitus without complications: E11.9

## 2019-02-05 LAB — CBC
HCT: 45.4 % (ref 39.0–52.0)
Hemoglobin: 14.9 g/dL (ref 13.0–17.0)
MCH: 31.4 pg (ref 26.0–34.0)
MCHC: 32.8 g/dL (ref 30.0–36.0)
MCV: 95.6 fL (ref 80.0–100.0)
Platelets: 201 10*3/uL (ref 150–400)
RBC: 4.75 MIL/uL (ref 4.22–5.81)
RDW: 13.6 % (ref 11.5–15.5)
WBC: 9.1 10*3/uL (ref 4.0–10.5)
nRBC: 0 % (ref 0.0–0.2)

## 2019-02-05 LAB — URINALYSIS, ROUTINE W REFLEX MICROSCOPIC
Bilirubin Urine: NEGATIVE
Glucose, UA: NEGATIVE mg/dL
Hgb urine dipstick: NEGATIVE
Ketones, ur: NEGATIVE mg/dL
Nitrite: NEGATIVE
Protein, ur: NEGATIVE mg/dL
Specific Gravity, Urine: 1.015 (ref 1.005–1.030)
pH: 5 (ref 5.0–8.0)

## 2019-02-05 LAB — ABO/RH: ABO/RH(D): AB NEG

## 2019-02-05 LAB — PROTIME-INR
INR: 1 (ref 0.8–1.2)
Prothrombin Time: 13.5 seconds (ref 11.4–15.2)

## 2019-02-05 LAB — COMPREHENSIVE METABOLIC PANEL
ALT: 43 U/L (ref 0–44)
AST: 28 U/L (ref 15–41)
Albumin: 3.8 g/dL (ref 3.5–5.0)
Alkaline Phosphatase: 72 U/L (ref 38–126)
Anion gap: 9 (ref 5–15)
BUN: 16 mg/dL (ref 8–23)
CO2: 22 mmol/L (ref 22–32)
Calcium: 9.1 mg/dL (ref 8.9–10.3)
Chloride: 109 mmol/L (ref 98–111)
Creatinine, Ser: 1.01 mg/dL (ref 0.61–1.24)
GFR calc Af Amer: >60 mL/min (ref >60)
GFR calc non Af Amer: >60 mL/min (ref >60)
Glucose, Bld: 135 mg/dL — ABNORMAL HIGH (ref 70–99)
Potassium: 4.2 mmol/L (ref 3.5–5.1)
Sodium: 140 mmol/L (ref 135–145)
Total Bilirubin: 0.8 mg/dL (ref 0.3–1.2)
Total Protein: 6.4 g/dL — ABNORMAL LOW (ref 6.5–8.1)

## 2019-02-05 LAB — SURGICAL PCR SCREEN
MRSA, PCR: NEGATIVE
Staphylococcus aureus: POSITIVE — AB

## 2019-02-05 LAB — TYPE AND SCREEN
ABO/RH(D): AB NEG
Antibody Screen: NEGATIVE

## 2019-02-05 LAB — APTT: aPTT: 27 seconds (ref 24–36)

## 2019-02-05 NOTE — Progress Notes (Signed)
PCR + for staph. Rx for mupirocin called in to [facility]. Pt aware.

## 2019-02-05 NOTE — Progress Notes (Addendum)
PCP - Dr. Joylene Draft Cardiologist - Dr. Martinique, does not see on regular basis  Chest x-ray - DOS EKG - 02/05/19 Stress Test - 02/02/19 ECHO - denies Cardiac Cath - denies  Sleep Study - in past, not dx'd with OSA   Fasting Blood Sugar - pt does not check blood sugar   Aspirin Instructions: Hold ASA DOS. Patient instructed to hold all  NSAID's, herbal medications, fish oil and vitamins 7 days prior to surgery.    COVID TEST- 02/05/19- pending resutls   Anesthesia review: review EKG  Patient denies shortness of breath, fever, cough and chest pain at PAT appointment   All instructions explained to the patient, with a verbal understanding of the material. Patient agrees to go over the instructions while at home for a better understanding. Patient also instructed to self quarantine after being tested for COVID-19. The opportunity to ask questions was provided.

## 2019-02-06 MED ORDER — DEXTROSE 5 % IV SOLN
3.0000 g | INTRAVENOUS | Status: AC
  Administered 2019-02-09: 3 g via INTRAVENOUS
  Filled 2019-02-06: qty 3

## 2019-02-06 NOTE — Progress Notes (Signed)
Anesthesia Chart Review: Dr. Kipp Brood ordered preop nuclear stress that was low risk, nonischemic.   Preop labs reviewed, WNL.  EKG 02/06/19: Normal sinus rhythm. Rate 62. Inferior infarct , age undetermined  Nuclear stress 02/03/19:  Nuclear stress EF: 52%. The LV function is at the low end of normal. The LV is mildly - moderately dilated.  There was no ST segment deviation noted during stress.  This is a low risk study. There is no evidence of ischemia and no evidence of previous infarction. The left ventricle is mildly to moderately dilated.  The study is normal.  PFTs:  01/14/2019 FVC 3.37 [74%], FEV1 2.77 [84%], F/F 82, TLC 4.27 [59%], DLCO 20.35 [76%] Moderate-severe restriction, minimal diffusion defect   Justin Atkinson [facility] Short Stay Center/Anesthesiology Phone [phone] 02/06/2019 10:18 AM

## 2019-02-06 NOTE — Anesthesia Preprocedure Evaluation (Addendum)
Anesthesia Evaluation  Patient identified by MRN, date of birth, ID band Patient awake    Reviewed: Allergy & Precautions, NPO status , Patient's Chart, lab work & pertinent test results  History of Anesthesia Complications Negative for: history of anesthetic complications  Airway Mallampati: IV  TM Distance: >3 FB Neck ROM: Full    Dental  (+) Dental Advisory Given, Teeth Intact   Pulmonary shortness of breath, COPD, former smoker,    breath sounds clear to auscultation       Cardiovascular hypertension, Pt. on medications (-) angina(-) Past MI  Rhythm:Regular   Nuclear stress EF: 52%. The LV function is at the low end of normal. The LV is mildly - moderately dilated.  There was no ST segment deviation noted during stress.  This is a low risk study. There is no evidence of ischemia and no evidence of previous infarction. The left ventricle is mildly to moderately dilated.  The study is normal.   Neuro/Psych negative neurological ROS  negative psych ROS   GI/Hepatic negative GI ROS, Neg liver ROS,   Endo/Other  diabetes, Oral Hypoglycemic AgentsMorbid obesity  Renal/GU Renal disease     Musculoskeletal  (+) Arthritis ,   Abdominal   Peds  Hematology negative hematology ROS (+)   Anesthesia Other Findings   Reproductive/Obstetrics                           Anesthesia Physical Anesthesia Plan  ASA: II  Anesthesia Plan: General   Post-op Pain Management:    Induction: Intravenous  PONV Risk Score and Plan: 2 and Ondansetron and Dexamethasone  Airway Management Planned: Oral ETT  Additional Equipment: Arterial line  Intra-op Plan:   Post-operative Plan: Extubation in OR  Informed Consent: I have reviewed the patients History and Physical, chart, labs and discussed the procedure including the risks, benefits and alternatives for the proposed anesthesia with the patient or  authorized representative who has indicated his/her understanding and acceptance.     Dental advisory given  Plan Discussed with: CRNA and Surgeon  Anesthesia Plan Comments: (Dr. Kipp Brood ordered preop nuclear stress that was low risk, nonischemic.   Preop labs reviewed, WNL.  EKG 02/06/19: Normal sinus rhythm. Rate 62. Inferior infarct , age undetermined  Nuclear stress 02/03/19:  Nuclear stress EF: 52%. The LV function is at the low end of normal. The LV is mildly - moderately dilated.  There was no ST segment deviation noted during stress.  This is a low risk study. There is no evidence of ischemia and no evidence of previous infarction. The left ventricle is mildly to moderately dilated.  The study is normal.  PFTs:  01/14/2019 FVC 3.37 (74%), FEV1 2.77 (84%), F/F 82, TLC 4.27 (59%), DLCO 20.35 (76%) Moderate-severe restriction, minimal diffusion defect )       Anesthesia Quick Evaluation

## 2019-02-07 LAB — NOVEL CORONAVIRUS, NAA (HOSP ORDER, SEND-OUT TO REF LAB; TAT 18-24 HRS): SARS-CoV-2, NAA: NOT DETECTED

## 2019-02-09 ENCOUNTER — Inpatient Hospital Stay (HOSPITAL_COMMUNITY): Payer: Medicare Other

## 2019-02-09 ENCOUNTER — Inpatient Hospital Stay (HOSPITAL_COMMUNITY)
Admission: RE | Admit: 2019-02-09 | Discharge: 2019-02-11 | DRG: 167 | Disposition: A | Discharge status: Home Health | Payer: Medicare Other | Attending: Thoracic Surgery (Cardiothoracic Vascular Surgery) | Admitting: Thoracic Surgery (Cardiothoracic Vascular Surgery)

## 2019-02-09 ENCOUNTER — Inpatient Hospital Stay (HOSPITAL_COMMUNITY): Payer: Medicare Other | Admitting: Physician Assistant

## 2019-02-09 ENCOUNTER — Encounter (HOSPITAL_COMMUNITY)
Admission: RE | Disposition: A | Discharge status: Home Health | Payer: Self-pay | Source: Home / Self Care | Attending: Thoracic Surgery (Cardiothoracic Vascular Surgery)

## 2019-02-09 ENCOUNTER — Inpatient Hospital Stay (HOSPITAL_COMMUNITY): Payer: Medicare Other | Admitting: Anesthesiology

## 2019-02-09 ENCOUNTER — Other Ambulatory Visit: Payer: Self-pay

## 2019-02-09 DIAGNOSIS — Z87891 Personal history of nicotine dependence: Secondary | ICD-10-CM | POA: Diagnosis not present

## 2019-02-09 DIAGNOSIS — Z4682 Encounter for fitting and adjustment of non-vascular catheter: Secondary | ICD-10-CM

## 2019-02-09 DIAGNOSIS — I440 Atrioventricular block, first degree: Secondary | ICD-10-CM | POA: Diagnosis present

## 2019-02-09 DIAGNOSIS — E7800 Pure hypercholesterolemia, unspecified: Secondary | ICD-10-CM | POA: Diagnosis present

## 2019-02-09 DIAGNOSIS — Z8551 Personal history of malignant neoplasm of bladder: Secondary | ICD-10-CM

## 2019-02-09 DIAGNOSIS — J8410 Pulmonary fibrosis, unspecified: Principal | ICD-10-CM | POA: Diagnosis present

## 2019-02-09 DIAGNOSIS — M5030 Other cervical disc degeneration, unspecified cervical region: Secondary | ICD-10-CM | POA: Diagnosis present

## 2019-02-09 DIAGNOSIS — Z419 Encounter for procedure for purposes other than remedying health state, unspecified: Secondary | ICD-10-CM

## 2019-02-09 DIAGNOSIS — H409 Unspecified glaucoma: Secondary | ICD-10-CM | POA: Diagnosis present

## 2019-02-09 DIAGNOSIS — Z20828 Contact with and (suspected) exposure to other viral communicable diseases: Secondary | ICD-10-CM | POA: Diagnosis present

## 2019-02-09 DIAGNOSIS — J9811 Atelectasis: Secondary | ICD-10-CM | POA: Diagnosis present

## 2019-02-09 DIAGNOSIS — M1990 Unspecified osteoarthritis, unspecified site: Secondary | ICD-10-CM | POA: Diagnosis present

## 2019-02-09 DIAGNOSIS — Z7984 Long term (current) use of oral hypoglycemic drugs: Secondary | ICD-10-CM

## 2019-02-09 DIAGNOSIS — Z79899 Other long term (current) drug therapy: Secondary | ICD-10-CM | POA: Diagnosis not present

## 2019-02-09 DIAGNOSIS — Q600 Renal agenesis, unilateral: Secondary | ICD-10-CM | POA: Diagnosis not present

## 2019-02-09 DIAGNOSIS — I10 Essential (primary) hypertension: Secondary | ICD-10-CM | POA: Diagnosis present

## 2019-02-09 DIAGNOSIS — Z09 Encounter for follow-up examination after completed treatment for conditions other than malignant neoplasm: Secondary | ICD-10-CM

## 2019-02-09 DIAGNOSIS — J439 Emphysema, unspecified: Secondary | ICD-10-CM | POA: Diagnosis not present

## 2019-02-09 DIAGNOSIS — J449 Chronic obstructive pulmonary disease, unspecified: Secondary | ICD-10-CM | POA: Diagnosis present

## 2019-02-09 DIAGNOSIS — J849 Interstitial pulmonary disease, unspecified: Secondary | ICD-10-CM | POA: Diagnosis present

## 2019-02-09 DIAGNOSIS — Z6841 Body Mass Index (BMI) 40.0 and over, adult: Secondary | ICD-10-CM | POA: Diagnosis not present

## 2019-02-09 DIAGNOSIS — Z809 Family history of malignant neoplasm, unspecified: Secondary | ICD-10-CM | POA: Diagnosis not present

## 2019-02-09 HISTORY — PX: VIDEO BRONCHOSCOPY: SHX5072

## 2019-02-09 HISTORY — PX: VIDEO ASSISTED THORACOSCOPY (VATS)/WEDGE RESECTION: SHX6174

## 2019-02-09 LAB — BLOOD GAS, ARTERIAL
Acid-Base Excess: 1.8 mmol/L (ref 0.0–2.0)
Bicarbonate: 26 mmol/L (ref 20.0–28.0)
FIO2: 0.21
O2 Saturation: 96.9 %
Patient temperature: 37
pCO2 arterial: 42.1 mmHg (ref 32.0–48.0)
pH, Arterial: 7.409 (ref 7.350–7.450)
pO2, Arterial: 88.2 mmHg (ref 83.0–108.0)

## 2019-02-09 LAB — GLUCOSE, CAPILLARY
Glucose-Capillary: 143 mg/dL — ABNORMAL HIGH (ref 70–99)
Glucose-Capillary: 146 mg/dL — ABNORMAL HIGH (ref 70–99)
Glucose-Capillary: 157 mg/dL — ABNORMAL HIGH (ref 70–99)
Glucose-Capillary: 157 mg/dL — ABNORMAL HIGH (ref 70–99)
Glucose-Capillary: 160 mg/dL — ABNORMAL HIGH (ref 70–99)
Glucose-Capillary: 186 mg/dL — ABNORMAL HIGH (ref 70–99)
Glucose-Capillary: 218 mg/dL — ABNORMAL HIGH (ref 70–99)

## 2019-02-09 LAB — MRSA PCR SCREENING: MRSA by PCR: NEGATIVE

## 2019-02-09 SURGERY — BRONCHOSCOPY, VIDEO-ASSISTED
Anesthesia: General | Site: Chest | Laterality: Right

## 2019-02-09 MED ORDER — CEFAZOLIN SODIUM-DEXTROSE 2-4 GM/100ML-% IV SOLN
2.0000 g | Freq: Three times a day (TID) | INTRAVENOUS | Status: AC
Start: 2019-02-09 — End: 2019-02-10
  Administered 2019-02-09 – 2019-02-10 (×2): 2 g via INTRAVENOUS
  Filled 2019-02-09 (×3): qty 100

## 2019-02-09 MED ORDER — 0.9 % SODIUM CHLORIDE (POUR BTL) OPTIME
TOPICAL | Status: DC | PRN
  Administered 2019-02-09: 1000 mL

## 2019-02-09 MED ORDER — ORAL CARE MOUTH RINSE
15.0000 mL | Freq: Two times a day (BID) | OROMUCOSAL | Status: DC
  Administered 2019-02-09 – 2019-02-11 (×4): 15 mL via OROMUCOSAL

## 2019-02-09 MED ORDER — INSULIN ASPART 100 UNIT/ML ~~LOC~~ SOLN
0.0000 [IU] | SUBCUTANEOUS | Status: DC
  Administered 2019-02-09: 8 [IU] via SUBCUTANEOUS
  Administered 2019-02-09: 2 [IU] via SUBCUTANEOUS
  Administered 2019-02-09: 4 [IU] via SUBCUTANEOUS
  Administered 2019-02-10 (×3): 2 [IU] via SUBCUTANEOUS
  Administered 2019-02-10 (×2): 4 [IU] via SUBCUTANEOUS
  Administered 2019-02-11: 2 [IU] via SUBCUTANEOUS
  Administered 2019-02-11: 4 [IU] via SUBCUTANEOUS
  Administered 2019-02-11: 2 [IU] via SUBCUTANEOUS

## 2019-02-09 MED ORDER — TIMOLOL MALEATE 0.5 % OP SOLN
1.0000 [drp] | Freq: Two times a day (BID) | OPHTHALMIC | Status: DC
  Administered 2019-02-10 – 2019-02-11 (×3): 1 [drp] via OPHTHALMIC
  Filled 2019-02-09: qty 5

## 2019-02-09 MED ORDER — OXYCODONE HCL 5 MG PO TABS: 5.0000 mg | ORAL_TABLET | Freq: Once | ORAL | Status: DC | PRN

## 2019-02-09 MED ORDER — SENNOSIDES-DOCUSATE SODIUM 8.6-50 MG PO TABS
1.0000 | ORAL_TABLET | Freq: Every day | ORAL | Status: DC
  Administered 2019-02-09: 1 via ORAL
  Filled 2019-02-09: qty 1

## 2019-02-09 MED ORDER — VITAMIN D 25 MCG (1000 UNIT) PO TABS
1000.0000 [IU] | ORAL_TABLET | Freq: Every day | ORAL | Status: DC
  Administered 2019-02-10 – 2019-02-11 (×2): 1000 [IU] via ORAL
  Filled 2019-02-09 (×2): qty 1

## 2019-02-09 MED ORDER — SODIUM CHLORIDE (PF) 0.9 % IJ SOLN
INTRAMUSCULAR | Status: DC | PRN
  Administered 2019-02-09: 50 mL

## 2019-02-09 MED ORDER — LIDOCAINE 2% (20 MG/ML) 5 ML SYRINGE
INTRAMUSCULAR | Status: AC
  Filled 2019-02-09: qty 5

## 2019-02-09 MED ORDER — SUGAMMADEX SODIUM 200 MG/2ML IV SOLN
INTRAVENOUS | Status: DC | PRN
  Administered 2019-02-09: 276.6 mg via INTRAVENOUS

## 2019-02-09 MED ORDER — TIMOLOL HEMIHYDRATE 0.5 % OP SOLN: 1.0000 [drp] | Freq: Two times a day (BID) | OPHTHALMIC | Status: DC

## 2019-02-09 MED ORDER — ENOXAPARIN SODIUM 40 MG/0.4ML ~~LOC~~ SOLN
40.0000 mg | SUBCUTANEOUS | Status: DC
  Administered 2019-02-10 – 2019-02-11 (×2): 40 mg via SUBCUTANEOUS
  Filled 2019-02-09 (×2): qty 0.4

## 2019-02-09 MED ORDER — ASPIRIN EC 81 MG PO TBEC
81.0000 mg | DELAYED_RELEASE_TABLET | Freq: Every day | ORAL | Status: DC
  Administered 2019-02-10 – 2019-02-11 (×2): 81 mg via ORAL
  Filled 2019-02-09 (×2): qty 1

## 2019-02-09 MED ORDER — UMECLIDINIUM-VILANTEROL 62.5-25 MCG/INH IN AEPB
1.0000 | INHALATION_SPRAY | Freq: Every day | RESPIRATORY_TRACT | Status: DC
  Filled 2019-02-09: qty 14

## 2019-02-09 MED ORDER — FENTANYL CITRATE (PF) 250 MCG/5ML IJ SOLN
INTRAMUSCULAR | Status: AC
  Filled 2019-02-09: qty 5

## 2019-02-09 MED ORDER — ROSUVASTATIN CALCIUM 5 MG PO TABS
10.0000 mg | ORAL_TABLET | Freq: Every day | ORAL | Status: DC
  Administered 2019-02-10 – 2019-02-11 (×2): 10 mg via ORAL
  Filled 2019-02-09 (×2): qty 2

## 2019-02-09 MED ORDER — ROCURONIUM BROMIDE 10 MG/ML (PF) SYRINGE
PREFILLED_SYRINGE | INTRAVENOUS | Status: DC | PRN
  Administered 2019-02-09: 100 mg via INTRAVENOUS
  Administered 2019-02-09: 30 mg via INTRAVENOUS

## 2019-02-09 MED ORDER — ONDANSETRON HCL 4 MG/2ML IJ SOLN
INTRAMUSCULAR | Status: AC
  Filled 2019-02-09: qty 2

## 2019-02-09 MED ORDER — LATANOPROST 0.005 % OP SOLN
1.0000 [drp] | Freq: Every day | OPHTHALMIC | Status: DC
  Administered 2019-02-09 – 2019-02-10 (×2): 1 [drp] via OPHTHALMIC
  Filled 2019-02-09: qty 2.5

## 2019-02-09 MED ORDER — IRBESARTAN 150 MG PO TABS
75.0000 mg | ORAL_TABLET | Freq: Every day | ORAL | Status: DC
  Administered 2019-02-10 – 2019-02-11 (×2): 75 mg via ORAL
  Filled 2019-02-09 (×2): qty 1

## 2019-02-09 MED ORDER — FENTANYL CITRATE (PF) 100 MCG/2ML IJ SOLN
INTRAMUSCULAR | Status: DC | PRN
  Administered 2019-02-09: 50 ug via INTRAVENOUS
  Administered 2019-02-09: 150 ug via INTRAVENOUS

## 2019-02-09 MED ORDER — PANTOPRAZOLE SODIUM 40 MG PO TBEC
40.0000 mg | DELAYED_RELEASE_TABLET | Freq: Every day | ORAL | Status: DC
  Administered 2019-02-10 – 2019-02-11 (×2): 40 mg via ORAL
  Filled 2019-02-09 (×2): qty 1

## 2019-02-09 MED ORDER — ACETAMINOPHEN 160 MG/5ML PO SOLN: 1000.0000 mg | Freq: Four times a day (QID) | ORAL | Status: DC

## 2019-02-09 MED ORDER — ONDANSETRON HCL 4 MG/2ML IJ SOLN: 4.0000 mg | Freq: Four times a day (QID) | INTRAMUSCULAR | Status: DC | PRN

## 2019-02-09 MED ORDER — MUPIROCIN 2 % EX OINT
TOPICAL_OINTMENT | Freq: Two times a day (BID) | CUTANEOUS | Status: AC
  Administered 2019-02-09: 21:00:00 via NASAL
  Filled 2019-02-09: qty 22

## 2019-02-09 MED ORDER — ACETAMINOPHEN 500 MG PO TABS: 1000.0000 mg | ORAL_TABLET | Freq: Once | ORAL | Status: DC | PRN

## 2019-02-09 MED ORDER — ACETAMINOPHEN 10 MG/ML IV SOLN
INTRAVENOUS | Status: AC
  Filled 2019-02-09: qty 100

## 2019-02-09 MED ORDER — BUPIVACAINE LIPOSOME 1.3 % IJ SUSP
INTRAMUSCULAR | Status: DC | PRN
  Administered 2019-02-09: 20 mL

## 2019-02-09 MED ORDER — LIDOCAINE 2% (20 MG/ML) 5 ML SYRINGE
INTRAMUSCULAR | Status: DC | PRN
  Administered 2019-02-09: 80 mg via INTRAVENOUS

## 2019-02-09 MED ORDER — LABETALOL HCL 5 MG/ML IV SOLN
INTRAVENOUS | Status: DC | PRN
  Administered 2019-02-09: 10 mg via INTRAVENOUS

## 2019-02-09 MED ORDER — BUPIVACAINE HCL (PF) 0.5 % IJ SOLN
INTRAMUSCULAR | Status: AC
  Filled 2019-02-09: qty 30

## 2019-02-09 MED ORDER — ROCURONIUM BROMIDE 10 MG/ML (PF) SYRINGE
PREFILLED_SYRINGE | INTRAVENOUS | Status: AC
  Filled 2019-02-09: qty 10

## 2019-02-09 MED ORDER — ACETAMINOPHEN 10 MG/ML IV SOLN
1000.0000 mg | Freq: Once | INTRAVENOUS | Status: DC | PRN
  Administered 2019-02-09: 1000 mg via INTRAVENOUS

## 2019-02-09 MED ORDER — DEXAMETHASONE SODIUM PHOSPHATE 10 MG/ML IJ SOLN
INTRAMUSCULAR | Status: DC | PRN
  Administered 2019-02-09: 10 mg via INTRAVENOUS

## 2019-02-09 MED ORDER — ACETAMINOPHEN 160 MG/5ML PO SOLN: 1000.0000 mg | Freq: Once | ORAL | Status: DC | PRN

## 2019-02-09 MED ORDER — PHENYLEPHRINE 40 MCG/ML (10ML) SYRINGE FOR IV PUSH (FOR BLOOD PRESSURE SUPPORT)
PREFILLED_SYRINGE | INTRAVENOUS | Status: AC
  Filled 2019-02-09: qty 10

## 2019-02-09 MED ORDER — BUPIVACAINE HCL 0.5 % IJ SOLN
INTRAMUSCULAR | Status: DC | PRN
  Administered 2019-02-09: 30 mL

## 2019-02-09 MED ORDER — LACTATED RINGERS IV SOLN
INTRAVENOUS | Status: DC
  Administered 2019-02-09: 09:00:00 via INTRAVENOUS

## 2019-02-09 MED ORDER — ROCURONIUM BROMIDE 10 MG/ML (PF) SYRINGE
PREFILLED_SYRINGE | INTRAVENOUS | Status: AC
  Filled 2019-02-09: qty 20

## 2019-02-09 MED ORDER — SODIUM CHLORIDE 0.45 % IV SOLN
INTRAVENOUS | Status: DC
  Administered 2019-02-09 – 2019-02-10 (×4): via INTRAVENOUS

## 2019-02-09 MED ORDER — TRAMADOL HCL 50 MG PO TABS
50.0000 mg | ORAL_TABLET | Freq: Four times a day (QID) | ORAL | Status: DC | PRN
  Administered 2019-02-10 (×2): 100 mg via ORAL
  Filled 2019-02-09 (×2): qty 2

## 2019-02-09 MED ORDER — PROPOFOL 10 MG/ML IV BOLUS
INTRAVENOUS | Status: DC | PRN
  Administered 2019-02-09: 30 mg via INTRAVENOUS
  Administered 2019-02-09: 100 mg via INTRAVENOUS
  Administered 2019-02-09: 70 mg via INTRAVENOUS

## 2019-02-09 MED ORDER — PHENYLEPHRINE HCL-NACL 10-0.9 MG/250ML-% IV SOLN
INTRAVENOUS | Status: DC | PRN
  Administered 2019-02-09: 50 ug/min via INTRAVENOUS

## 2019-02-09 MED ORDER — HYDRALAZINE HCL 20 MG/ML IJ SOLN
10.0000 mg | Freq: Four times a day (QID) | INTRAMUSCULAR | Status: DC | PRN
  Administered 2019-02-09 (×2): 10 mg via INTRAVENOUS
  Filled 2019-02-09: qty 1

## 2019-02-09 MED ORDER — ACETAMINOPHEN 500 MG PO TABS
1000.0000 mg | ORAL_TABLET | Freq: Four times a day (QID) | ORAL | Status: DC
  Administered 2019-02-09 – 2019-02-11 (×5): 1000 mg via ORAL
  Filled 2019-02-09 (×8): qty 2

## 2019-02-09 MED ORDER — PROPOFOL 10 MG/ML IV BOLUS
INTRAVENOUS | Status: AC
  Filled 2019-02-09: qty 20

## 2019-02-09 MED ORDER — FENTANYL CITRATE (PF) 100 MCG/2ML IJ SOLN
25.0000 ug | INTRAMUSCULAR | Status: DC | PRN
  Administered 2019-02-09 (×2): 50 ug via INTRAVENOUS

## 2019-02-09 MED ORDER — DEXAMETHASONE SODIUM PHOSPHATE 10 MG/ML IJ SOLN
INTRAMUSCULAR | Status: AC
  Filled 2019-02-09: qty 1

## 2019-02-09 MED ORDER — LACTATED RINGERS IV SOLN
INTRAVENOUS | Status: DC | PRN
  Administered 2019-02-09: 13:00:00 via INTRAVENOUS

## 2019-02-09 MED ORDER — FENTANYL CITRATE (PF) 100 MCG/2ML IJ SOLN
INTRAMUSCULAR | Status: AC
  Filled 2019-02-09: qty 2

## 2019-02-09 MED ORDER — BUPIVACAINE LIPOSOME 1.3 % IJ SUSP
20.0000 mL | Freq: Once | INTRAMUSCULAR | Status: DC
  Filled 2019-02-09: qty 20

## 2019-02-09 MED ORDER — SODIUM CHLORIDE 0.9 % IV SOLN: INTRAVENOUS | Status: DC | PRN

## 2019-02-09 MED ORDER — OXYCODONE HCL 5 MG/5ML PO SOLN: 5.0000 mg | Freq: Once | ORAL | Status: DC | PRN

## 2019-02-09 MED ORDER — KETOROLAC TROMETHAMINE 15 MG/ML IJ SOLN
15.0000 mg | Freq: Four times a day (QID) | INTRAMUSCULAR | Status: DC | PRN
  Administered 2019-02-09 – 2019-02-11 (×3): 15 mg via INTRAVENOUS
  Filled 2019-02-09 (×3): qty 1

## 2019-02-09 MED ORDER — LABETALOL HCL 5 MG/ML IV SOLN
INTRAVENOUS | Status: AC
  Filled 2019-02-09: qty 4

## 2019-02-09 MED ORDER — BISACODYL 5 MG PO TBEC
10.0000 mg | DELAYED_RELEASE_TABLET | Freq: Every day | ORAL | Status: DC
  Administered 2019-02-09: 10 mg via ORAL
  Filled 2019-02-09 (×3): qty 2

## 2019-02-09 MED ORDER — MORPHINE SULFATE (PF) 2 MG/ML IV SOLN: 2.0000 mg | INTRAVENOUS | Status: DC | PRN

## 2019-02-09 SURGICAL SUPPLY — 97 items
APPLIER CLIP ROT 10 11.4 M/L (STAPLE)
BLADE CLIPPER SURG (BLADE) ×4 IMPLANT
CANISTER SUCT 3000ML PPV (MISCELLANEOUS) ×4 IMPLANT
CATH ROBINSON RED A/P 14FR (CATHETERS) IMPLANT
CATH THORACIC 28FR (CATHETERS) IMPLANT
CATH THORACIC 28FR RT ANG (CATHETERS) IMPLANT
CATH THORACIC 36FR (CATHETERS) IMPLANT
CATH THORACIC 36FR RT ANG (CATHETERS) IMPLANT
CATH TROCAR 20FR (CATHETERS) IMPLANT
CLIP APPLIE ROT 10 11.4 M/L (STAPLE) IMPLANT
CLIP VESOCCLUDE MED 6/CT (CLIP) ×4 IMPLANT
CONN ST 1/4X3/8  BEN (MISCELLANEOUS)
CONN ST 1/4X3/8 BEN (MISCELLANEOUS) IMPLANT
CONN Y 3/8X3/8X3/8  BEN (MISCELLANEOUS)
CONN Y 3/8X3/8X3/8 BEN (MISCELLANEOUS) IMPLANT
CONT SPEC 4OZ CLIKSEAL STRL BL (MISCELLANEOUS) ×8 IMPLANT
COVER SURGICAL LIGHT HANDLE (MISCELLANEOUS) IMPLANT
COVER WAND RF STERILE (DRAPES) ×4 IMPLANT
DEFOGGER SCOPE WARMER CLEARIFY (MISCELLANEOUS) ×4 IMPLANT
DERMABOND ADVANCED (GAUZE/BANDAGES/DRESSINGS) ×2
DERMABOND ADVANCED .7 DNX12 (GAUZE/BANDAGES/DRESSINGS) ×2 IMPLANT
DISSECTOR BLUNT TIP ENDO 5MM (MISCELLANEOUS) IMPLANT
DRAIN CHANNEL 28F RND 3/8 FF (WOUND CARE) IMPLANT
DRAIN CHANNEL 32F RND 10.7 FF (WOUND CARE) IMPLANT
DRAPE WARM FLUID 44X44 (DRAPES) IMPLANT
ELECT BLADE 6.5 EXT (BLADE) ×4 IMPLANT
ELECT REM PT RETURN 9FT ADLT (ELECTROSURGICAL) ×4
ELECTRODE REM PT RTRN 9FT ADLT (ELECTROSURGICAL) ×2 IMPLANT
GAUZE SPONGE 4X4 12PLY STRL (GAUZE/BANDAGES/DRESSINGS) ×4 IMPLANT
GAUZE SPONGE 4X4 12PLY STRL LF (GAUZE/BANDAGES/DRESSINGS) ×4 IMPLANT
GLOVE BIO SURGEON STRL SZ 6.5 (GLOVE) ×3 IMPLANT
GLOVE BIO SURGEON STRL SZ7 (GLOVE) ×8 IMPLANT
GLOVE BIO SURGEONS STRL SZ 6.5 (GLOVE) ×1
GOWN STRL REUS W/ TWL LRG LVL3 (GOWN DISPOSABLE) ×4 IMPLANT
GOWN STRL REUS W/ TWL XL LVL3 (GOWN DISPOSABLE) ×2 IMPLANT
GOWN STRL REUS W/TWL LRG LVL3 (GOWN DISPOSABLE) ×4
GOWN STRL REUS W/TWL XL LVL3 (GOWN DISPOSABLE) ×2
HANDLE STAPLE ENDO GIA SHORT (STAPLE) ×1
HEMOSTAT SURGICEL 2X14 (HEMOSTASIS) IMPLANT
KIT BASIN OR (CUSTOM PROCEDURE TRAY) ×4 IMPLANT
KIT SUCTION CATH 14FR (SUCTIONS) IMPLANT
KIT TURNOVER KIT B (KITS) ×4 IMPLANT
NEEDLE 22X1 1/2 (OR ONLY) (NEEDLE) ×4 IMPLANT
NEEDLE SPNL 18GX3.5 QUINCKE PK (NEEDLE) IMPLANT
NS IRRIG 1000ML POUR BTL (IV SOLUTION) ×12 IMPLANT
PACK CHEST (CUSTOM PROCEDURE TRAY) ×4 IMPLANT
PACK UNIVERSAL I (CUSTOM PROCEDURE TRAY) ×4 IMPLANT
PAD ARMBOARD 7.5X6 YLW CONV (MISCELLANEOUS) ×8 IMPLANT
POUCH ENDO CATCH II 15MM (MISCELLANEOUS) IMPLANT
POUCH SPECIMEN RETRIEVAL 10MM (ENDOMECHANICALS) ×4 IMPLANT
RELOAD EGIA 45 MED/THCK PURPLE (STAPLE) ×44 IMPLANT
SCISSORS LAP 5X35 DISP (ENDOMECHANICALS) IMPLANT
SEALANT PROGEL (MISCELLANEOUS) IMPLANT
SEALANT SURG COSEAL 4ML (VASCULAR PRODUCTS) IMPLANT
SEALANT SURG COSEAL 8ML (VASCULAR PRODUCTS) IMPLANT
SEALER LIGASURE MARYLAND 30 (ELECTROSURGICAL) IMPLANT
SET IRRIG TUBING LAPAROSCOPIC (IRRIGATION / IRRIGATOR) IMPLANT
SOL ANTI FOG 6CC (MISCELLANEOUS) IMPLANT
SOLUTION ANTI FOG 6CC (MISCELLANEOUS)
SPECIMEN JAR MEDIUM (MISCELLANEOUS) IMPLANT
SPONGE INTESTINAL PEANUT (DISPOSABLE) ×4 IMPLANT
SPONGE TONSIL TAPE 1 RFD (DISPOSABLE) ×4 IMPLANT
STAPLER ENDO GIA 12MM SHORT (STAPLE) ×3 IMPLANT
STOPCOCK 4 WAY LG BORE MALE ST (IV SETS) ×4 IMPLANT
SUT MNCRL AB 3-0 PS2 18 (SUTURE) IMPLANT
SUT MON AB 2-0 CT1 36 (SUTURE) IMPLANT
SUT PDS AB 1 CTX 36 (SUTURE) IMPLANT
SUT PROLENE 4 0 RB 1 (SUTURE)
SUT PROLENE 4-0 RB1 .5 CRCL 36 (SUTURE) IMPLANT
SUT SILK  1 MH (SUTURE) ×2
SUT SILK 1 MH (SUTURE) ×2 IMPLANT
SUT SILK 1 TIES 10X30 (SUTURE) ×4 IMPLANT
SUT SILK 2 0 SH (SUTURE) IMPLANT
SUT SILK 2 0SH CR/8 30 (SUTURE) IMPLANT
SUT VIC AB 1 CTX 36 (SUTURE)
SUT VIC AB 1 CTX36XBRD ANBCTR (SUTURE) IMPLANT
SUT VIC AB 2-0 CT1 27 (SUTURE) ×2
SUT VIC AB 2-0 CT1 TAPERPNT 27 (SUTURE) ×2 IMPLANT
SUT VIC AB 3-0 SH 27 (SUTURE) ×4
SUT VIC AB 3-0 SH 27X BRD (SUTURE) ×4 IMPLANT
SUT VICRYL 0 UR6 27IN ABS (SUTURE) ×4 IMPLANT
SUT VICRYL 2 TP 1 (SUTURE) IMPLANT
SYR 10ML LL (SYRINGE) ×4 IMPLANT
SYR 30ML LL (SYRINGE) IMPLANT
SYR 50ML LL SCALE MARK (SYRINGE) ×4 IMPLANT
SYSTEM SAHARA CHEST DRAIN ATS (WOUND CARE) ×4 IMPLANT
TAPE CLOTH 4X10 WHT NS (GAUZE/BANDAGES/DRESSINGS) ×4 IMPLANT
TAPE CLOTH SURG 4X10 WHT LF (GAUZE/BANDAGES/DRESSINGS) ×4 IMPLANT
TIP APPLICATOR SPRAY EXTEND 16 (VASCULAR PRODUCTS) IMPLANT
TOWEL GREEN STERILE (TOWEL DISPOSABLE) ×4 IMPLANT
TOWEL GREEN STERILE FF (TOWEL DISPOSABLE) ×4 IMPLANT
TRAY FOLEY MTR SLVR 16FR STAT (SET/KITS/TRAYS/PACK) ×4 IMPLANT
TROCAR XCEL 12X100 BLDLESS (ENDOMECHANICALS) ×8 IMPLANT
TROCAR XCEL BLADELESS 5X75MML (TROCAR) ×4 IMPLANT
TUBING EXTENTION W/L.L. (IV SETS) ×4 IMPLANT
TUBING LAP HI FLOW INSUFFLATIO (TUBING) ×4 IMPLANT
WATER STERILE IRR 1000ML POUR (IV SOLUTION) ×4 IMPLANT

## 2019-02-09 NOTE — Transfer of Care (Signed)
Immediate Anesthesia Transfer of Care Note  Patient: Justin Atkinson.  Procedure(s) Performed: VIDEO BRONCHOSCOPY (N/A ) VIDEO ASSISTED THORACOSCOPY (VATS)/WEDGE RESECTION (Right Chest)  Patient Location: PACU  Anesthesia Type:General  Level of Consciousness: awake, alert , oriented and patient cooperative  Airway & Oxygen Therapy: Patient Spontanous Breathing and Patient connected to face mask oxygen  Post-op Assessment: Report given to RN and Post -op Vital signs reviewed and stable  Post vital signs: Reviewed and stable  Last Vitals:  Vitals Value Taken Time  BP 155/134 02/09/19 1513  Temp    Pulse 58 02/09/19 1518  Resp 18 02/09/19 1518  SpO2 93 % 02/09/19 1518  Vitals shown include unvalidated device data.  Last Pain:  Vitals:   02/09/19 0929  PainSc: 0-No pain         Complications: No apparent anesthesia complications

## 2019-02-09 NOTE — Brief Op Note (Signed)
02/09/2019  3:05 PM  PATIENT:  Justin Atkinson.  78 y.o. male  PRE-OPERATIVE DIAGNOSIS:  PULMONARY FIBROSIS  POST-OPERATIVE DIAGNOSIS:  PULMONARY FIBROSIS  PROCEDURE:  Procedure(s):  VIDEO BRONCHOSCOPY (N/A) VIDEO ASSISTED THORACOSCOPY (VATS) -Wedge Resection Right Upper Lobe -Wedge Resection Right Middle Lobe -Wedge Resection Right Lower Lobe  INTERCOSTAL NERVE BLOCK with EXPAREL  SURGEON:  Surgeon(s) and Role:    * Lightfoot, Lucile Crater, MD - Primary  PHYSICIAN ASSISTANT: Ellwood Handler PA-C  ANESTHESIA:   general  EBL:  50 mL   BLOOD ADMINISTERED:none  DRAINS: 28 Straight Chest Tube, right chest   LOCAL MEDICATIONS USED:  BUPIVICAINE   SPECIMEN:  Source of Specimen:  Wedge Resection RUL, RML, RLL  DISPOSITION OF SPECIMEN:  PATHOLOGY  COUNTS:  YES  TOURNIQUET:  * No tourniquets in log *  DICTATION: .Dragon Dictation  PLAN OF CARE: Admit to inpatient   PATIENT DISPOSITION:  PACU - hemodynamically stable.   Delay start of Pharmacological VTE agent (>24hrs) due to surgical blood loss or risk of bleeding: yes

## 2019-02-09 NOTE — Anesthesia Procedure Notes (Signed)
Procedure Name: Intubation Date/Time: 02/09/2019 1:43 PM Performed by: Griffin Dakin, CRNA Pre-anesthesia Checklist: Patient identified, Emergency Drugs available, Suction available and Patient being monitored Patient Re-evaluated:Patient Re-evaluated prior to induction Oxygen Delivery Method: Circle system utilized Preoxygenation: Pre-oxygenation with 100% oxygen Induction Type: IV induction Ventilation: Mask ventilation without difficulty Laryngoscope Size: Glidescope and 3 Tube type: Oral Endobronchial tube: Left and Double lumen EBT and 41 Fr Number of attempts: 1 Airway Equipment and Method: Stylet and Oral airway Placement Confirmation: ETT inserted through vocal cords under direct vision,  positive ETCO2 and breath sounds checked- equal and bilateral Secured at: 31 cm Tube secured with: Tape Dental Injury: Teeth and Oropharynx as per pre-operative assessment

## 2019-02-09 NOTE — H&P (Signed)
[address]       [place] [address]             [phone]       No changes since his clinic appointment  Interstitial lung disease OR to day for bronchoscopy, right thoracoscopy, and wedge resection of the right lung  Per my previous note  [name]. [place] Record [id number] Date of Birth: 05/16/1940  Referring: Marshell Garfinkel, MD Primary Care: [name], MD Primary Cardiologist: No primary care provider on file.  Chief Complaint:        Chief Complaint  Patient presents with  . Interstitial Lung Disease    Surgical eval, Chest CT 09/22/18, PFT's 01/14/19    History of Present Illness:    Edel Koven. 78 y.o. male referred by Dr.Mannam to discuss surgical options for diagnosis of pulmonary fibrosis.  Over the last several months he has had increasing exertional dyspnea and a worsening cough.  CTS has been consulted to assist in his care.    Smoking Hx: Quit smoking 40 years ago.   Zubrod Score: At the time of surgery this patient's most appropriate activity status/level should be described as: [] ?    0    Normal activity, no symptoms [x] ?    1    Restricted in physical strenuous activity but ambulatory, able to do out light work [] ?    2    Ambulatory and capable of self care, unable to do work activities, up and about               >50 % of waking hours                              [] ?    3    Only limited self care, in bed greater than 50% of waking hours [] ?    4    Completely disabled, no self care, confined to bed or chair [] ?    5    Moribund       Past Medical History:  Diagnosis Date  . Bladder cancer [facility]) 2001   Surgery  . Complication of anesthesia 2001   woke up during colonoscopy  . COPD (chronic obstructive pulmonary disease) ([place])    "beginning Emphesma"  . DDD (degenerative disc disease), cervical   . DJD (degenerative joint disease)   . Glaucoma   . HTN (hypertension)    . Hypercholesterolemia   . Kidney anomaly, congenital    "born with one kidney"  . Shortness of breath dyspnea    due to overweight and decreased exercise         Past Surgical History:  Procedure Laterality Date  . BLADDER SURGERY  2001   cystoscopic  . [place]  . COLONOSCOPY W/ POLYPECTOMY    . COLONOSCOPY WITH PROPOFOL N/A 05/04/2015   Procedure: COLONOSCOPY WITH PROPOFOL;  Surgeon: Clarene Essex, MD;  Location: [facility] ENDOSCOPY;  Service: Endoscopy;  Laterality: N/A;  . EYE SURGERY Bilateral    laser for Glaucoma  . HERNIA REPAIR  1960  . INGUINAL HERNIA REPAIR Right 08/01/2012   Procedure: HERNIA REPAIR INGUINAL ADULT;  Surgeon: Earnstine Regal, MD;  Location: [facility] ORS;  Service: General;  Laterality: Right;  . INSERTION OF MESH Right 08/01/2012   Procedure: INSERTION OF MESH;  Surgeon: Earnstine Regal, MD;  Location: [facility] ORS;  Service: General;  Laterality: Right;  . TONSILLECTOMY           Family History  Problem Relation Age of Onset  . Cancer Father        lung     Social History       Tobacco Use  Smoking Status Former Smoker  . Packs/day: 0.25  . Years: 40.00  . Pack years: 10.00  . Types: Cigarettes, Pipe  . Quit date: 12/02/2014  . Years since quitting: 4.1  Smokeless Tobacco Never Used    Social History       Substance and Sexual Activity  Alcohol Use Yes  . Alcohol/week: 14.0 standard drinks  . Types: 14 Shots of liquor per week   Comment: Scotch and water- 2 a day - somedays less     No Known Allergies        Current Outpatient Medications  Medication Sig Dispense Refill  . ANORO ELLIPTA 62.5-25 MCG/INH AEPB Inhale 1 puff into the lungs daily.     . Brimonidine Tartrate (LUMIFY) 0.025 % SOLN Apply 1 drop to eye daily.    . Brinzolamide-Brimonidine (SIMBRINZA) 1-0.2 % SUSP Apply 1 drop to eye 2 (two) times daily. In left eye only    . Cholecalciferol (VITAMIN D-3 PO) Take 1,000 Units by mouth  daily.    . irbesartan (AVAPRO) 150 MG tablet Take 300 mg by mouth daily.     Marland Kitchen LUMIGAN 0.01 % SOLN Place 1 drop into both eyes at bedtime.     . metFORMIN (GLUCOPHAGE-XR) 500 MG 24 hr tablet Take 500 mg by mouth 2 (two) times daily.     . Multiple Vitamins-Minerals (MULTIVITAMIN PO) Take 1 tablet by mouth daily.     . Omega-3 Fatty Acids (FISH OIL) 1000 MG CAPS Take by mouth.    . pantoprazole (PROTONIX) 40 MG tablet Take 40 mg by mouth daily.     . rosuvastatin (CRESTOR) 20 MG tablet Take 10 mg by mouth daily.      No current facility-administered medications for this visit.     Review of Systems  Constitutional: Positive for malaise/fatigue and weight loss. Negative for chills and fever.  Eyes: Positive for redness.  Respiratory: Positive for cough, sputum production and shortness of breath.   Cardiovascular: Negative for chest pain and palpitations.  Gastrointestinal: Negative.   Musculoskeletal: Positive for back pain, joint pain and myalgias.  Skin: Negative.   Neurological: Negative.      PHYSICAL EXAMINATION: BP (!) 148/74   Pulse 75   Temp 97.7 F (36.5 C) (Skin)   Resp 20   Ht 6' (1.829 m)   Wt 300 lb (136.1 kg)   SpO2 90% Comment: RA  BMI 40.69 kg/m  Physical Exam  Constitutional: He is oriented to person, place, and time. He appears well-developed and well-nourished. No distress.  HENT:  Head: Normocephalic and atraumatic.  Eyes:  Left eye conjunctivitis   Neck: Normal range of motion. No tracheal deviation present.  Cardiovascular: Normal rate, regular rhythm and normal heart sounds.  No murmur heard. Respiratory: Effort normal. No respiratory distress.  Distant breath sounds bilaterally  GI: Soft. He exhibits no distension.  Musculoskeletal: Normal range of motion.        General: No edema.  Neurological: He is alert and oriented to person, place, and time.  Skin: Skin is warm and dry. He is not diaphoretic.    Diagnostic  Studies & Laboratory data:     Recent Radiology Findings:  Imaging Results  No results found.       I have independently reviewed the above radiology studies  and reviewed the findings with the patient.   Recent Lab Findings: Recent Labs       Lab Results  Component Value Date   WBC 9.7 01/14/2019   HGB 15.0 01/14/2019   HCT 44.5 01/14/2019   PLT 175.0 01/14/2019   GLUCOSE 126 (H) 01/14/2019   ALT 43 01/14/2019   AST 32 01/14/2019   NA 140 01/14/2019   K 4.2 01/14/2019   CL 103 01/14/2019   CREATININE 0.95 01/14/2019   BUN 19 01/14/2019   CO2 27 01/14/2019       PFTs: - FVC: 73% - FEV1: 78% -DLCO: 76%   Assessment / Plan:   78 year old gentleman with history of pulmonary fibrosis who presents with a discussion with surgical biopsy.  Risk benefits and alternatives were discussed in detail and he has agreed to proceed with a bronchoscopy right VATS wedge resection.  He is tentatively scheduled for November 9.  He will require stress test prior to surgery.  Of note, his wife has multiple sclerosis and he is the main caregiver.  They do have home health aides for her and a family member will be coming to assist him after surgery.  Anesthesia note: He does not require a central line.  This will be performed under intermittent apnea.       Latasha Puskas Bary Leriche

## 2019-02-09 NOTE — Progress Notes (Signed)
Patient ID: Rand Forseth., male   DOB: 1940/11/22, 78 y.o.   MRN: [id number]    TCTS Evening Rounds:   Hemodynamically stable but hypertensive 170's Not in pain.   Urine output good  CT output low, no air leak  A/P:  Stable postop course. Continue current plans. Resume Avapro at reduced dose tomorrow. Will add prn hydralazine tonight.

## 2019-02-09 NOTE — Op Note (Signed)
      [address]       [place] [address]             [phone]        02/09/2019  Patient:  Nazire Chisman. Pre-Op Dx: Interstitial lung disease   Post-op Dx:  same Procedure: - Bronchoscopy - Right Video assisted thoracoscopy - Right upper, middle, and lower lobe wedge resection - Intercostal nerve block  Surgeon and Role:      * Jaquilla Woodroof, Lucile Crater, MD - Primary    * E. Barrett, PA-C - assisting  EBL:  50 ml Blood Administration: none Specimen:  Right upper, middle, and lower lobe wedge  Drains: 28 F argyle chest tube in right chest Counts: correct   Indications: June Leap Jr.78 y.o.malereferred by Dr.Mannamto discuss surgical options for diagnosis of pulmonary fibrosis. Over the last several months he has had increasing exertional dyspnea and a worsening cough.  Findings: Normal appearing lung  Operative Technique: After the risks, benefits and alternatives were thoroughly discussed, the patient was brought to the operative theatre.  Anesthesia was induced, and the bronchoscope was passed through the endotracheal tube.  All segmental bronchi were visualized.  The endotracheal tube was then exchanged for a double lumen tube.  The patient was then placed in a left lateral decubitus position and was prepped and draped in normal sterile fashion.  An appropriate surgical pause was performed, and pre-operative antibiotics were dosed accordingly.  We began with 2 cm incision in the anterior axillary line at the 8th intercostal space.  The chest was entered using a 58mm port.  The chest was then insufflated.  We then placed a 1cm incision at the 10th intercostal space, and introduced our camera port.  The lung was directly visualized.  We then performed our wedge resections under intermittent apnea using an endoGIA staple.  An intercostal nerve block was performed under direct visualization.  A 39F chest with then placed, and we watch the  remaining lobes re-expand.  The skin and soft tissue were closed with absorbable suture    The patient tolerated the procedure without any immediate complications, and was transferred to the PACU in stable condition.  Kherington Meraz Bary Leriche

## 2019-02-09 NOTE — Anesthesia Procedure Notes (Signed)
Arterial Line Insertion Start/End11/12/2018 10:14 AM Performed by: Renato Shin, CRNA  Preanesthetic checklist: patient identified, IV checked, site marked, risks and benefits discussed, surgical consent, monitors and equipment checked, pre-op evaluation, timeout performed and anesthesia consent Left, radial was placed Catheter size: 20 G Hand hygiene performed , maximum sterile barriers used  and Seldinger technique used Allen's test indicative of satisfactory collateral circulation Attempts: 1 Procedure performed without using ultrasound guided technique. Following insertion, dressing applied and Biopatch. Post procedure assessment: normal  Patient tolerated the procedure well with no immediate complications.

## 2019-02-10 ENCOUNTER — Encounter (HOSPITAL_COMMUNITY): Payer: Self-pay | Admitting: Thoracic Surgery (Cardiothoracic Vascular Surgery)

## 2019-02-10 ENCOUNTER — Inpatient Hospital Stay (HOSPITAL_COMMUNITY): Payer: Medicare Other

## 2019-02-10 LAB — BLOOD GAS, ARTERIAL
Acid-Base Excess: 1.7 mmol/L (ref 0.0–2.0)
Bicarbonate: 25.8 mmol/L (ref 20.0–28.0)
FIO2: 32
O2 Saturation: 96.3 %
Patient temperature: 36.4
pCO2 arterial: 39.9 mmHg (ref 32.0–48.0)
pH, Arterial: 7.423 (ref 7.350–7.450)
pO2, Arterial: 79.3 mmHg — ABNORMAL LOW (ref 83.0–108.0)

## 2019-02-10 LAB — BASIC METABOLIC PANEL
Anion gap: 9 (ref 5–15)
BUN: 13 mg/dL (ref 8–23)
CO2: 24 mmol/L (ref 22–32)
Calcium: 8.8 mg/dL — ABNORMAL LOW (ref 8.9–10.3)
Chloride: 106 mmol/L (ref 98–111)
Creatinine, Ser: 0.99 mg/dL (ref 0.61–1.24)
GFR calc Af Amer: >60 mL/min (ref >60)
GFR calc non Af Amer: >60 mL/min (ref >60)
Glucose, Bld: 175 mg/dL — ABNORMAL HIGH (ref 70–99)
Potassium: 4.4 mmol/L (ref 3.5–5.1)
Sodium: 139 mmol/L (ref 135–145)

## 2019-02-10 LAB — GLUCOSE, CAPILLARY
Glucose-Capillary: 166 mg/dL — ABNORMAL HIGH (ref 70–99)
Glucose-Capillary: 193 mg/dL — ABNORMAL HIGH (ref 70–99)
Glucose-Capillary: 142 mg/dL — ABNORMAL HIGH (ref 70–99)
Glucose-Capillary: 138 mg/dL — ABNORMAL HIGH (ref 70–99)
Glucose-Capillary: 140 mg/dL — ABNORMAL HIGH (ref 70–99)

## 2019-02-10 LAB — CBC
HCT: 41.5 % (ref 39.0–52.0)
Hemoglobin: 13.9 g/dL (ref 13.0–17.0)
MCH: 31.7 pg (ref 26.0–34.0)
MCHC: 33.5 g/dL (ref 30.0–36.0)
MCV: 94.5 fL (ref 80.0–100.0)
Platelets: 182 10*3/uL (ref 150–400)
RBC: 4.39 MIL/uL (ref 4.22–5.81)
RDW: 13.4 % (ref 11.5–15.5)
WBC: 10.3 10*3/uL (ref 4.0–10.5)
nRBC: 0 % (ref 0.0–0.2)

## 2019-02-10 MED ORDER — CHLORHEXIDINE GLUCONATE CLOTH 2 % EX PADS
6.0000 | MEDICATED_PAD | Freq: Every day | CUTANEOUS | Status: DC
  Administered 2019-02-10 – 2019-02-11 (×2): 6 via TOPICAL

## 2019-02-10 NOTE — Discharge Instructions (Signed)

## 2019-02-10 NOTE — Progress Notes (Signed)
As per pt. MD removed right chest tube and applied tegaderm to site.

## 2019-02-10 NOTE — Anesthesia Postprocedure Evaluation (Signed)
Anesthesia Post Note  Patient: Justin Atkinson.  Procedure(s) Performed: VIDEO BRONCHOSCOPY (N/A ) VIDEO ASSISTED THORACOSCOPY (VATS)/WEDGE RESECTION (Right Chest)     Patient location during evaluation: PACU Anesthesia Type: General Level of consciousness: awake and alert Pain management: pain level controlled Vital Signs Assessment: post-procedure vital signs reviewed and stable Respiratory status: spontaneous breathing, nonlabored ventilation, respiratory function stable and patient connected to nasal cannula oxygen Cardiovascular status: blood pressure returned to baseline and stable Postop Assessment: no apparent nausea or vomiting Anesthetic complications: no    Last Vitals:  Vitals:   02/10/19 0423 02/10/19 0659  BP: 140/68   Pulse:    Resp: 18   Temp:  36.7 C  SpO2:      Last Pain:  Vitals:   02/10/19 0756  TempSrc:   PainSc: 7                  Latorya Bautch

## 2019-02-10 NOTE — Progress Notes (Addendum)
      [name]       [name]             [phone]      1 Day Post-Op Procedure(s) (LRB): VIDEO BRONCHOSCOPY (N/A) VIDEO ASSISTED THORACOSCOPY (VATS)/WEDGE RESECTION (Right) Subjective: Feels okay this morning. Walking limited due to [name].    Objective: Vital signs in last 24 hours: Temp:  [97.5 F (36.4 C)-98.4 F (36.9 C)] 98 F (36.7 C) (11/10 0659) Pulse Rate:  [57-71] 64 (11/10 0100) Cardiac Rhythm: Bundle branch block;Heart block (11/10 0731) Resp:  [13-20] 18 (11/10 0423) BP: (125-182)/(64-90) 140/68 (11/10 0423) SpO2:  [93 %-97 %] 95 % (11/10 0142) Arterial Line BP: (156-182)/(62-76) 160/71 (11/09 1900) Weight:  [137.2 kg-138.3 kg] 137.2 kg (11/10 0100)     Intake/Output from previous day: 11/09 0701 - 11/10 0700 In: 1776.9 [I.V.:1698.3; IV Piggyback:78.6] Out: 1775 [Urine:1525; Blood:50; Chest Tube:200] Intake/Output this shift: No intake/output data recorded.  General appearance: alert, cooperative and no distress Heart: regular rate and rhythm, S1, S2 normal, no murmur, click, rub or gallop Lungs: clear to auscultation bilaterally Abdomen: soft, non-tender; bowel sounds normal; no masses,  no organomegaly Extremities: 1+ pitting edema in bilateral lower legs Wound: clean and dry  Lab Results: Recent Labs    02/10/19 0254  WBC 10.3  HGB 13.9  HCT 41.5  PLT 182   BMET:  Recent Labs    02/10/19 0254  NA 139  K 4.4  CL 106  CO2 24  GLUCOSE 175*  BUN 13  CREATININE 0.99  CALCIUM 8.8*    PT/INR: No results for input(s): LABPROT, INR in the last 72 hours. ABG    Component Value Date/Time   PHART 7.423 02/10/2019 0646   HCO3 25.8 02/10/2019 0646   O2SAT 96.3 02/10/2019 0646   CBG (last 3)  Recent Labs    02/09/19 2018 02/09/19 2349 02/10/19 0413  GLUCAP 218* 186* 166*    Assessment/Plan: S/P Procedure(s) (LRB): VIDEO BRONCHOSCOPY (N/A) VIDEO ASSISTED THORACOSCOPY (VATS)/WEDGE RESECTION  (Right)  1. CV- 1st degree AVB HR in the 60s. BP well controlled. Continue asa, avapro, and statin 2. Pulm- Continue 2L  for support. CXR showed no pneumothorax, but increases subsegmental atelectasis in the right lung base.  3. Renal-creatinine 0.99, electrolytes okay 4. H and H 13.9/41.5, stable 5. Endo-blood glucose moderately controlled. He was on metformin at home-will restart closer to DC.  Continue insulin for now.  Plan: Chest tube to water seal. Continue for now. Possibly home in the am. Working on weaning oxygen requirements-the patient was not on oxygen at home. Ambulation and standing limited to [name]. Will get PT to work with him. Will try Toradol for his pain today. Encourage use of incentive spirometer.    LOS: 1 day    Justin Atkinson 02/10/2019   Agree with above No leak, will remove CT today Will wean O2  Justin Atkinson O Justin Atkinson

## 2019-02-10 NOTE — Progress Notes (Addendum)
Assisted to the bathroom, noted with unsteady gait. Chest tube site with minimal bleeding noted, dressing applied. Continue to monitor.

## 2019-02-10 NOTE — Discharge Summary (Signed)
[address]       ,[place] [address]             [phone]      Physician Discharge Summary  Patient ID: Justin Atkinson. MRN: [id number] DOB/AGE: [birth year] 78 y.o.  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 Over the last several months he has had increasing exertional dyspnea and a worsening cough. CTS has been consulted to assist in his care.  Hospital Course:   Justin Atkinson underwent a bronchoscopy, right thoracoscopy, and wedge resection of the right lung with Dr. Kipp Brood on 02/09/2019. He tolerated the procedure well and was transferred to the surgical ICU for continued care. He was extubated in a timely manner. POD 1 he was transferred to the stepdown unit and his chest tube was changed to water seal. He remained hemodynamically stable and in NSR. We continued 2L for oxygen support. His CXR remained stable. We continued morphine, toradol, and ultram for pain. He was encouraged to use his incentive spirometer. His ambulation was somewhat limited due to his degenerative disc disease. We ordered physical therapy to assist. His chest tube was removed on  02/10/2019. His follow-up CXR showed Post right chest tube removal with new subcutaneous emphysema. No definite pneumothorax. Today, he is ambulating at his baseline, he is tolerating room air with excellent oxygenation, his incisions are healing well, and he is ready for discharge home.   Consults: none  Significant Diagnostic Studies:   CLINICAL DATA:  Patient status post right upper, middle and lower lobe wedge resection 02/09/2019. Chest tube in place.  EXAM: PORTABLE CHEST 1 VIEW  COMPARISON:  Single-view of the chest 02/09/2019.  FINDINGS: Right chest tube remains in place. No pneumothorax. Subsegmental atelectasis in the right lung base has increased. The left lung is clear. Heart size is normal.  IMPRESSION: Negative for pneumothorax with a right chest tube in place.  Increased subsegmental atelectasis right lung base.   Electronically Signed   By: Inge Rise M.D.   On: 02/10/2019 06:43   Treatments:   02/09/2019  Patient:  Justin Atkinson. Pre-Op Dx: Interstitial lung disease   Post-op Dx:  same Procedure: - Bronchoscopy - Right Video assisted thoracoscopy - Right upper, middle, and lower lobe wedge resection - Intercostal nerve block  Surgeon and Role:      * Lightfoot, Lucile Crater, MD - Primary    * E. Barrett, PA-C - assisting  EBL:  50 ml Blood Administration: none Specimen:  Right upper, middle, and lower lobe wedge  Drains: 28 F argyle chest tube in right chest Counts: correct   Indications: Justin Atkinson78 y.o.malereferred by Dr.Mannamto discuss surgical options for diagnosis of pulmonary fibrosis. Over the last several months he  has had increasing exertional dyspnea and a worsening cough.  Findings: Normal appearing lung  Operative Technique: After the risks, benefits and alternatives were thoroughly discussed, the patient was brought to the operative theatre.  Anesthesia was induced, and the bronchoscope  was passed through the endotracheal tube.  All segmental bronchi were visualized.  The endotracheal tube was then exchanged for a double lumen tube.  The patient was then placed in a left lateral decubitus position and was prepped and draped in normal sterile fashion.  An appropriate surgical pause was performed, and pre-operative antibiotics were dosed accordingly.  We began with 2 cm incision in the anterior axillary line at the 8th intercostal space.  The chest was entered using a 59mm port.  The chest was then insufflated.  We then placed a 1cm incision at the 10th intercostal space, and introduced our camera port.  The lung was directly visualized.  We then performed our wedge resections under intermittent apnea using an endoGIA staple.  An intercostal nerve block was performed under direct visualization.  A 26F chest with then placed, and we watch the remaining lobes re-expand.  The skin and soft tissue were closed with absorbable suture    The patient tolerated the procedure without any immediate complications, and was transferred to the PACU in stable condition.  Justin Atkinson  Discharge Exam: Blood pressure 139/63, pulse 69, temperature 97.9 F (36.6 C), temperature source Oral, resp. rate (!) 24, height 6\' 1"  (1.854 m), weight (!) 137.2 kg, SpO2 92 %.   General appearance: alert, cooperative and no distress Heart: regular rate and rhythm, S1, S2 normal, no murmur, click, rub or gallop Lungs: clear to auscultation bilaterally Abdomen: soft, non-tender; bowel sounds normal; no masses,  no organomegaly Extremities: extremities normal, atraumatic, no cyanosis or edema Wound: clean and dry. + subq air around the right chest tube site  Disposition: Discharge disposition: 01-Home or Self Care        Allergies as of 02/11/2019   No Known Allergies     Medication List    TAKE these medications   acetaminophen 500 MG tablet Commonly known as: TYLENOL Take 2 tablets (1,000  mg total) by mouth every 6 (six) hours.   Anoro Ellipta 62.5-25 MCG/INH Aepb Generic drug: umeclidinium-vilanterol Inhale 1 puff into the lungs daily.   aspirin EC 81 MG tablet Take 81 mg by mouth daily.   Fish Oil 1000 MG Caps Take 1,000 mg by mouth daily.   irbesartan 75 MG tablet Commonly known as: AVAPRO Take 1 tablet (75 mg total) by mouth daily. What changed:   medication strength  how much to take   Lumigan 0.01 % Soln Generic drug: bimatoprost Place 1 drop into both eyes at bedtime.   metFORMIN 500 MG 24 hr tablet Commonly known as: GLUCOPHAGE-XR Take 500 mg by mouth 2 (two) times daily.   MULTIVITAMIN PO Take 1 tablet by mouth daily.   pantoprazole 40 MG tablet Commonly known as: PROTONIX Take 40 mg by mouth daily.   rosuvastatin 10 MG tablet Commonly known as: CRESTOR Take 10 mg by mouth daily.   Simbrinza 1-0.2 % Susp Generic drug: Brinzolamide-Brimonidine Place 1 drop into the left eye 2 (two) times daily.   timolol 0.5 % ophthalmic solution Commonly known as: BETIMOL Place 1 drop into the left eye 2 (two) times daily.   traMADol 50 MG tablet Commonly known as: ULTRAM Take 1 tablet (50 mg total) by mouth every 6 (six) hours as needed (mild  pain).   VITAMIN D-3 PO Take 1,000 Units by mouth daily.            Durable Medical Equipment  (From admission, onward)         Start     Ordered   02/11/19 1501  For home use only DME Walker rolling  Once    Question:  Patient needs a walker to treat with the following condition  Answer:  Debility   02/11/19 1500   02/11/19 1450  For home use only DME oxygen  Once    Comments: COPD; pulmonary fibrosis s/p bronchoscopy and video assisted thoracoscopy with Right upper, middle, and lower lobe wedge resection  Question Answer Comment  Length of Need Lifetime   Mode or (Route) Nasal cannula   Liters per Minute 2   Frequency Continuous (stationary and portable oxygen unit needed)   Oxygen delivery  system Gas      02/11/19 1455         Follow-up Information    [name], MD. Call in 1 day(s).   Specialty: Internal Medicine Contact information: [place] [address] [phone]        Marshell Garfinkel, MD. Call in 1 day(s).   Specialty: Pulmonary Disease Why: Your follow-up appointment is on 02/18/2019 at 12:00pm (noon)  Contact information: [address] [place] [address] [phone]        Justin Matte, MD Follow up.   Specialty: Cardiothoracic Surgery Why: Your follow-up appointment is on 02/20/19 at 1:15pm. Please bring your hospital paperwork.  Contact information: [address] [place] [address] [phone]        Care, [facility] Follow up.   Specialty: Home Health Services Why: Home Health Registered Nurse and Physical Therapy; you will receive a call to discuss scheduling Contact information: [address] [place] [address] [phone]        [place] Follow up.   Why: home oxygen Contact information: [place] [address] [phone]        [place] Follow up.   Why: community case Chartered certified accountant information: [place] [place] [phone]          Signed: Elgie Collard 02/11/2019, 3:00 PM

## 2019-02-11 ENCOUNTER — Ambulatory Visit: Payer: Medicare Other | Admitting: Pulmonary Disease

## 2019-02-11 ENCOUNTER — Inpatient Hospital Stay (HOSPITAL_COMMUNITY): Payer: Medicare Other

## 2019-02-11 LAB — COMPREHENSIVE METABOLIC PANEL
ALT: 22 U/L (ref 0–44)
AST: 22 U/L (ref 15–41)
Albumin: 3.4 g/dL — ABNORMAL LOW (ref 3.5–5.0)
Alkaline Phosphatase: 66 U/L (ref 38–126)
Anion gap: 9 (ref 5–15)
BUN: 15 mg/dL (ref 8–23)
CO2: 22 mmol/L (ref 22–32)
Calcium: 8.4 mg/dL — ABNORMAL LOW (ref 8.9–10.3)
Chloride: 108 mmol/L (ref 98–111)
Creatinine, Ser: 0.92 mg/dL (ref 0.61–1.24)
GFR calc Af Amer: >60 mL/min (ref >60)
GFR calc non Af Amer: >60 mL/min (ref >60)
Glucose, Bld: 139 mg/dL — ABNORMAL HIGH (ref 70–99)
Potassium: 4.1 mmol/L (ref 3.5–5.1)
Sodium: 139 mmol/L (ref 135–145)
Total Bilirubin: 0.9 mg/dL (ref 0.3–1.2)
Total Protein: 5.8 g/dL — ABNORMAL LOW (ref 6.5–8.1)

## 2019-02-11 LAB — GLUCOSE, CAPILLARY
Glucose-Capillary: 104 mg/dL — ABNORMAL HIGH (ref 70–99)
Glucose-Capillary: 119 mg/dL — ABNORMAL HIGH (ref 70–99)
Glucose-Capillary: 121 mg/dL — ABNORMAL HIGH (ref 70–99)
Glucose-Capillary: 131 mg/dL — ABNORMAL HIGH (ref 70–99)
Glucose-Capillary: 186 mg/dL — ABNORMAL HIGH (ref 70–99)

## 2019-02-11 LAB — CBC
HCT: 40.7 % (ref 39.0–52.0)
Hemoglobin: 13.4 g/dL (ref 13.0–17.0)
MCH: 32 pg (ref 26.0–34.0)
MCHC: 32.9 g/dL (ref 30.0–36.0)
MCV: 97.1 fL (ref 80.0–100.0)
Platelets: 161 10*3/uL (ref 150–400)
RBC: 4.19 MIL/uL — ABNORMAL LOW (ref 4.22–5.81)
RDW: 13.9 % (ref 11.5–15.5)
WBC: 12.8 10*3/uL — ABNORMAL HIGH (ref 4.0–10.5)
nRBC: 0 % (ref 0.0–0.2)

## 2019-02-11 MED ORDER — IRBESARTAN 75 MG PO TABS: 75.0000 mg | ORAL_TABLET | Freq: Every day | ORAL | 1 refills | Status: AC

## 2019-02-11 MED ORDER — ACETAMINOPHEN 500 MG PO TABS: 1000.0000 mg | ORAL_TABLET | Freq: Four times a day (QID) | ORAL | 0 refills | Status: AC

## 2019-02-11 MED ORDER — TRAMADOL HCL 50 MG PO TABS: 50.0000 mg | ORAL_TABLET | Freq: Four times a day (QID) | ORAL | 0 refills | Status: AC | PRN

## 2019-02-11 NOTE — Consult Note (Signed)
   [facility] CM Inpatient Consult   02/11/2019  Justin Atkinson. Oct 01, 1940 [id number]    Referral received from Inpatient transition of care CM for [facility] care management services related to polypharmacy, community care management related to hx.COPD.  Patient checked for risk of unplanned readmission and hospitalization (9%) under his Medicare/ [employer] A[employer] plan.  Chart review and Justin Atkinson notes reveal that patient: Justin Atkinson78 y.o.malereferred by Dr.Mannamto discuss surgical options for diagnosis of pulmonary fibrosis. Over the last several months he has had increasing exertional dyspnea and a worsening cough. CTS has been consulted to assist in his care and underwent a bronchoscopy, right thoracoscopy, and wedge resection of the right lung with Dr. Kipp Brood on 02/09/2019. His chest tube was removed on 02/10/2019.  Call made and spoke to patient and his cousin Justin Atkinson- RN from [place]). HIPAA information verified. Discussed possible discharge needs of patient and [facility] care management services as referred by Inpatient TOC CM. Patient's cousin was concerned and interested for close follow-up of patient post discharge given his respiratory status from pulmonary fibrosis and hx of COPD; and being new to oxygen use at home.   Patient lives with his disabled wife at home and his cousin will be staying for few days to assist with post discharge needs. According to patient's cousin, caretakers of his wife will be able to assist him with needs once she leaves for [place].  Patient and cousin verbalized not having any concernswith medications (self- managed and had been compliant); pharmacy (using [facility] [place] without difficulty); transportation (has friends to drive if needed and has not missed any doctors' appointments).  Patient endorses primary care provider as Justin Infante, Justin Atkinson with [facility] (listed as providing transition of care).  Care coordination call made with  transition of care CM, to verify needs as stated by patient/cousin. Patient will transition to home with home health services [facility]).  Referral made to [place] CM for complex care management r/t respiratory status from pulmonary fibrosis s/p VATS and hx.COPD and being new to oxygen use at home.  Of note, [facility] Care Management services does not replace or interfere with any services that are arranged by transition of care case management or social work.   For questions and additional information, please contact:  Malak Duchesneau A. Akyra Bouchie, BSN, RN-BC [facility] [place] [facility] Liaison Cell: [phone]

## 2019-02-11 NOTE — Progress Notes (Addendum)
      [address]       [place] [address]             [phone]      2 Days Post-Op Procedure(s) (LRB): VIDEO BRONCHOSCOPY (N/A) VIDEO ASSISTED THORACOSCOPY (VATS)/WEDGE RESECTION (Right) Subjective: Feels okay this morning. He feels like he does not need the oxygen.   Objective: Vital signs in last 24 hours: Temp:  [97.7 F (36.5 C)-98.5 F (36.9 C)] 98.1 F (36.7 C) (11/11 0743) Pulse Rate:  [57-70] 59 (11/11 0743) Cardiac Rhythm: Sinus bradycardia (11/11 0736) Resp:  [14-18] 18 (11/11 0743) BP: (125-158)/(56-76) 125/69 (11/11 0743) SpO2:  [85 %-97 %] 94 % (11/11 0743) FiO2 (%):  [2 %] 2 % (11/10 2100)     Intake/Output from previous day: 11/10 0701 - 11/11 0700 In: 1680 [P.O.:480; I.V.:1200] Out: 602 [Urine:602] Intake/Output this shift: No intake/output data recorded.  General appearance: alert, cooperative and no distress Heart: regular rate and rhythm, S1, S2 normal, no murmur, click, rub or gallop Lungs: clear to auscultation bilaterally Abdomen: soft, non-tender; bowel sounds normal; no masses,  no organomegaly Extremities: extremities normal, atraumatic, no cyanosis or edema Wound: clean and dry. + subq air around the right chest tube site  Lab Results: Recent Labs    02/10/19 0254 02/11/19 0120  WBC 10.3 12.8*  HGB 13.9 13.4  HCT 41.5 40.7  PLT 182 161   BMET:  Recent Labs    02/10/19 0254 02/11/19 0120  NA 139 139  K 4.4 4.1  CL 106 108  CO2 24 22  GLUCOSE 175* 139*  BUN 13 15  CREATININE 0.99 0.92  CALCIUM 8.8* 8.4*    PT/INR: No results for input(s): LABPROT, INR in the last 72 hours. ABG    Component Value Date/Time   PHART 7.423 02/10/2019 0646   HCO3 25.8 02/10/2019 0646   O2SAT 96.3 02/10/2019 0646   CBG (last 3)  Recent Labs    02/10/19 2359 02/11/19 0328 02/11/19 0747  GLUCAP 131* 119* 186*    Assessment/Plan: S/P Procedure(s) (LRB): VIDEO BRONCHOSCOPY (N/A) VIDEO ASSISTED THORACOSCOPY  (VATS)/WEDGE RESECTION (Right)  1. CV-SB at times. BP well controlled 2. Pulm-tolerating 4L [place]. Will order a 6 minute walk test to assess need. He was not on oxygen at home. CXR this morning shows subq emphysema which is new around the chest tube insertion site. Chest tube site is covered with a Tegaderm. No pneumothorax 3. Renal-creatinine 0.92, electrolytes okay 4. H and H stable.  Plan: Chest tube removed yesterday but now he has new subq emphysema. Will have Dr. Kipp Brood review. Await result from the 6 minute walk test. Possibly home later today vs. Tomorrow.    LOS: 2 days    Justin Atkinson 02/11/2019   Doing well Home O2 eval Discussed case with Dr. Vaughan Browner.  Will follow-up later this week.  Justin Atkinson Bary Leriche

## 2019-02-11 NOTE — Progress Notes (Signed)
Pt left the unit at 5.50pm with all of his belongings, discharge education provided and patient is aware of the medicines left to take tonight and from tomorrow, pt left the unit in a wheelchair accompanied with a family member.  Palma Holter, RN

## 2019-02-11 NOTE — Plan of Care (Signed)
  Problem: Respiratory: Goal: Respiratory status will improve Outcome: Completed/Met   Problem: Skin Integrity: Goal: Wound healing without signs and symptoms of infection Outcome: Completed/Met Goal: Risk for impaired skin integrity will decrease Outcome: Completed/Met   Problem: Urinary Elimination: Goal: Ability to achieve and maintain adequate renal perfusion and functioning will improve Outcome: Completed/Met

## 2019-02-11 NOTE — Progress Notes (Signed)
Pt has a discharge order per receiving his home oxygen, waiting for that this moment. IV off, tele dc, family member in bed side and is updated, will continue to monitor the patient  Palma Holter, Therapist, sports

## 2019-02-11 NOTE — TOC Transition Note (Signed)
Transition of Care [facility]) - CM/SW Discharge Note   Patient Details  Name: Justin Atkinson. MRN: [id number] Date of Birth: 01/22/1941  Transition of Care [facility]) CM/SW Contact:  Midge Minium RN, BSN, NCM-BC, ACM-RN [phone] Phone Number: 02/11/2019, 2:57 PM   Clinical Narrative:    CM following for dispositional needs. CM spoke to the patient to discuss the POC; verbal permission was provided by the patient to also discuss the [place] with his cousin, Donnal Moat (retired Therapist, sports). Patient lives at home with his disabled spouse; Patient is a 78 y.o. male with pulmonary fibrosis s/p bronchoscopy and VATs. CM verified demo/PCP. PT eval completed with home oxygen recommended. CM spoke to the [facility] RN, who had concerns that a HHRN would be beneficial post-discharge. CM discussed the recommendations with the patient/counsin, with both agreeable to [facility] referral, [place] services and home oxygen. CMS HH compare and DME list provided with no preference. HH referral given to Adela Lank RN [facility] liaison); Home oxygen referral given to Learta Codding [facility] liaison); AVS updated. [facility] referral made to follow outpatient for case management/community resources. No further needs from CM.   Final next level of care: [place] Barriers to Discharge: No Barriers Identified   Patient Goals and CMS Choice Patient states their goals for this hospitalization and ongoing recovery are:: "to go home" CMS Medicare.gov Compare Post Acute Care list provided to:: Patient Represenative (must comment)(Linda Jake Michaelis (cousin)) Choice offered to / list presented to : Patient(Linda Jake Michaelis (cousin))   Discharge Plan and Services                DME Arranged: Oxygen DME Agency: [place] Date DME Agency Contacted: 02/11/19 Time DME Agency Contacted: [phone] Representative spoke with at DME Agency: Learta Codding (liaison) [place] Arranged: RN, Disease Management, PT [place] Agency: [place] Date  [place]: 02/11/19 Time [place]: 1457 Representative spoke with at [place]: Adela Lank RN (liaison)  Social Determinants of Health (SDOH) Interventions     Readmission Risk Interventions No flowsheet data found.

## 2019-02-11 NOTE — Plan of Care (Signed)
  Problem: Respiratory: Goal: Respiratory status will improve Outcome: Completed/Met   Problem: Respiratory: Goal: Respiratory status will improve Outcome: Completed/Met   Problem: Skin Integrity: Goal: Wound healing without signs and symptoms of infection Outcome: Completed/Met Goal: Risk for impaired skin integrity will decrease Outcome: Completed/Met   Problem: Urinary Elimination: Goal: Ability to achieve and maintain adequate renal perfusion and functioning will improve Outcome: Completed/Met

## 2019-02-11 NOTE — Evaluation (Signed)
Physical Therapy Evaluation Patient Details Name: Justin Atkinson. MRN: [id number] DOB: Jun 02, 1940 Today's Date: 02/11/2019   History of Present Illness  78 y.o. male with pulmonary fibrosis s/p bronchoscopy and video assisted thoracoscopy with Right upper, middle, and lower lobe wedge resection.  Clinical Impression  Pt demonstrates deficits in activity tolerance, pulmonary function, gait, and balance. At baseline pt is a limited community ambulator, limited by back pain. Pt requires use of railing during ambulation 2/2 back pain but is otherwise independent. Pt also requiring 2L [place] during functional mobility to maintain SpO2 over 90%. Pt will benefit from continued acute PT services to improve activity tolerance. 6MWT documented below. Pt will benefit from home Oxygen due to desaturation on room air during activity and at rest.  6 Minute Walk Test: Saturation on Room Air at rest- 86%, asymptomatic Pt does not ambulate on room air as he desaturates at rest. Ambulating on 2L [place] patient saturates from 90-96% with slight increased work of breathing. Pt only able to ambulate for 2 minutes, covering 200', limited by chronic back pain.     Follow Up Recommendations No PT follow up    Equipment Recommendations  None recommended by PT(already owns cane)    Recommendations for Other Services       Precautions / Restrictions Precautions Precautions: Fall Restrictions Weight Bearing Restrictions: No      Mobility  Bed Mobility Overal bed mobility: (pt up in recliner upon arrival)                Transfers Overall transfer level: Independent Equipment used: None                Ambulation/Gait Ambulation/Gait assistance: Modified independent (Device/Increase time) Gait Distance (Feet): 200 Feet Assistive device: (railing intermittently) Gait Pattern/deviations: Step-through pattern;Wide base of support Gait velocity: reduced Gait velocity interpretation: 1.31 - 2.62  ft/sec, indicative of limited community ambulator General Gait Details: pt with widened step through gait, intermittently using railing during 2nd half of ambulation due to back pain  Stairs            Wheelchair Mobility    Modified Rankin (Stroke Patients Only)       Balance Overall balance assessment: Modified Independent(intermittent use of rail)                                           Pertinent Vitals/Pain Pain Assessment: Faces Faces Pain Scale: Hurts even more Pain Location: back Pain Descriptors / Indicators: Aching Pain Intervention(s): Limited activity within patient's tolerance    Home Living Family/patient expects to be discharged to:: Private residence Living Arrangements: Spouse/significant other Available Help at Discharge: Personal care attendant;Available PRN/intermittently Type of Home: House Home Access: Level entry     Home Layout: Two level Home Equipment: Cane - quad      Prior Function Level of Independence: Independent         Comments: pt limited community ambulator 2/2 back pain from DDD     Hand Dominance        Extremity/Trunk Assessment   Upper Extremity Assessment Upper Extremity Assessment: Overall WFL for tasks assessed    Lower Extremity Assessment Lower Extremity Assessment: Overall WFL for tasks assessed    Cervical / Trunk Assessment Cervical / Trunk Assessment: Normal  Communication   Communication: No difficulties  Cognition Arousal/Alertness: Awake/alert Behavior During Therapy: WFL for tasks  assessed/performed Overall Cognitive Status: Within Functional Limits for tasks assessed                                        General Comments General comments (skin integrity, edema, etc.): Pt on 4L [place] upon PT arrival, saturating in low 90s. Pt desaturates to 84% on room air. Pt saturating from 90-96% on 2L [place] during mobility.    Exercises     Assessment/Plan    PT  Assessment Patient needs continued PT services  PT Problem List Decreased activity tolerance;Decreased balance;Decreased mobility;Cardiopulmonary status limiting activity;Pain       PT Treatment Interventions Gait training;Stair training;Functional mobility training;Therapeutic exercise;Therapeutic activities;Balance training;Neuromuscular re-education;Patient/family education    PT Goals (Current goals can be found in the Care Plan section)  Acute Rehab PT Goals Patient Stated Goal: To go home PT Goal Formulation: With patient Time For Goal Achievement: 02/25/19 Potential to Achieve Goals: Good    Frequency Min 3X/week   Barriers to discharge        Co-evaluation               AM-PAC PT "6 Clicks" Mobility  Outcome Measure Help needed turning from your back to your side while in a flat bed without using bedrails?: None Help needed moving from lying on your back to sitting on the side of a flat bed without using bedrails?: None Help needed moving to and from a bed to a chair (including a wheelchair)?: None Help needed standing up from a chair using your arms (e.g., wheelchair or bedside chair)?: None Help needed to walk in hospital room?: None Help needed climbing 3-5 steps with a railing? : None 6 Click Score: 24    End of Session Equipment Utilized During Treatment: Oxygen Activity Tolerance: Patient limited by pain Patient left: in chair;with call bell/phone within reach Nurse Communication: Mobility status PT Visit Diagnosis: Other abnormalities of gait and mobility (R26.89)    Time: [id number] PT Time Calculation (min) (ACUTE ONLY): 18 min   Charges:   PT Evaluation $PT Eval Low Complexity: [place], PT, DPT Acute Rehabilitation Pager: [phone]   Zenaida Niece 02/11/2019, 9:01 AM

## 2019-02-12 ENCOUNTER — Telehealth: Payer: Self-pay | Admitting: Pulmonary Disease

## 2019-02-12 NOTE — Telephone Encounter (Signed)
I called and spoke with the patient.  He is doing well post discharge.  On 2 L oxygen Denies any worsening dyspnea He will keep follow-up appointment next week with me where we can hopefully review the biopsy results.  Nothing further needed.

## 2019-02-12 NOTE — Telephone Encounter (Signed)
Called and spoke to pt. Pt stated that he was discharged from hospital on 02/11/2019 after having a bronch. Pt stated that on his discharge summary, it states to call Dr. Vaughan Browner. Pt is unsure as to why this is on his instructions. Pt has pending OV on 02/18/2019.  Pt can be reached at [phone] if call is needed.  Dr. Vaughan Browner please advise. Thanks

## 2019-02-16 ENCOUNTER — Other Ambulatory Visit: Payer: Self-pay | Admitting: *Deleted

## 2019-02-16 NOTE — Patient Outreach (Signed)
[place] [facility]) Care Management  02/16/2019  Justin Atkinson. [birth year] [id number]   Subjective: Telephone call to patient's home number, spoke with patient, and HIPAA verified.  Discussed [facility] Liaison referral  follow up, patient voiced understanding, and is in agreement to follow up next week after MD follow up appointments.   Patient states he is doing well, had start of care home health visit with [facility], he declined home health nursing services, has accepted home health physical therapy services, no start of care to date, was scheduled to have a visit on 02/15/2019 but the therapist did not come, and he is planning to follow with [facility] regarding physical therapy.  States he has a follow up appointment with pulmonologist on 02/18/2019, with surgeon on 02/20/2019, and no follow up with primary MD needed at this time.  States he remembers speaking with referral source regarding this referral and does not want to enroll in [facility] program until after he has went to all of his hospital follow up appointments. Patient is aware that [facility] is not a home health company.   States he is very appreciative of the follow up and is in agreement to receive [place] [facility] information.     Objective: Per KPN (Knowledge Performance Now, point of care tool) and chart review, patient hospitalized 02/09/2019 -02/11/2019 for Postinflammatory pulmonary fibrosis, ILD (interstitial lung disease), status post  Bronchoscopy,  Right Video assisted thoracoscopy, Right upper, middle, and lower lobe wedge resection, Intercostal nerve block on 02/09/2019.     Patient also has a history of renal cyst, Infected sebaceous cyst of skin, Malignant neoplasm of lateral wall of urinary bladder, Inguinal hernia unilateral, Benign prostate hyperplasia, Tobacco abuse, Hypertension, and Hypercholesterolemia.        Assessment: Received Medicare [facility] Liaison referral on 02/12/2019.  Referral source: Dannielle Huh.   Referral reason: complex care management related to respiratory status from pulmonary fibrosis status post VATS and hx.COPD, new to oxygen use at home; and to assess further needs post discharge, diagnosis COPD.    Screening  follow up pending patient contact.     Plan: RNCM will send unsuccessful outreach  letter, [facility] pamphlet, will call patient for 2nd telephone outreach attempt within 10 business days (per patient's request), screening follow up, and will proceed with case closure within 10 business days if no return call.    Lynniah Janoski [name], BSN, [place] [facility] Telephonic CM Phone: [phone] Fax: [phone]

## 2019-02-17 LAB — SURGICAL PATHOLOGY

## 2019-02-18 ENCOUNTER — Ambulatory Visit (INDEPENDENT_AMBULATORY_CARE_PROVIDER_SITE_OTHER): Payer: Medicare Other | Admitting: Pulmonary Disease

## 2019-02-18 ENCOUNTER — Encounter: Payer: Self-pay | Admitting: Pulmonary Disease

## 2019-02-18 ENCOUNTER — Other Ambulatory Visit: Payer: Self-pay

## 2019-02-18 VITALS — BP 122/76 | HR 70 | Temp 97.3°F | Wt 302.0 lb

## 2019-02-18 DIAGNOSIS — J849 Interstitial pulmonary disease, unspecified: Secondary | ICD-10-CM

## 2019-02-18 MED ORDER — PREDNISONE 20 MG PO TABS: 40.0000 mg | ORAL_TABLET | Freq: Every day | ORAL | 1 refills | Status: AC

## 2019-02-18 MED ORDER — BLOOD GLUCOSE METER KIT: PACK | 0 refills | Status: AC

## 2019-02-18 MED ORDER — BLOOD GLUCOSE METER KIT: PACK | 0 refills | Status: DC

## 2019-02-18 NOTE — Progress Notes (Signed)
Justin Atkinson    [id number]    [birth year]  Primary Care Physician:Perini, Elta Guadeloupe, MD  Referring Physician: [name], [place],  [place] [address]  Chief complaint: Follow up pulmonary fibrosis  HPI: 78 year old with history of bladder cancer, GERD, emphysema, COPD.  Referred for evaluation of pulmonary fibrosis noted on CT of the chest.  Patient is complains of dyspnea on exertion.  No symptoms at rest.  Denies any cough, sputum production.  Has been told he has COPD but no PFTs on record.  Currently maintained on Anoro inhaler Has significant symptoms of acid reflux for which he takes Tums as needed.  Pets: No pets, birds, farm animals Occupation: Worked as a Engineer, structural and in [employer] Exposures: No known exposures, no mold, hot tub, Jacuzzi, humidifier Smoking history: 40-pack-year smoker.  Quit in 2016 Travel history: Lived in [place] all his life.  Used to travel for his job. Relevant family history: Father had COPD.  He was a smoker.  Interim history: Underwent surgical lung biopsy earlier this month.  He is here for review pathology Discharged on 2 L oxygen. States that his doing well with improving dyspnea since hospitalization.  Outpatient Encounter Medications as of 02/18/2019  Medication Sig  . acetaminophen (TYLENOL) 500 MG tablet Take 2 tablets (1,000 mg total) by mouth every 6 (six) hours.  Jearl Klinefelter ELLIPTA 62.5-25 MCG/INH AEPB Inhale 1 puff into the lungs daily.   Marland Kitchen aspirin EC 81 MG tablet Take 81 mg by mouth daily.  . Brinzolamide-Brimonidine (SIMBRINZA) 1-0.2 % SUSP Place 1 drop into the left eye 2 (two) times daily.   . Cholecalciferol (VITAMIN D-3 PO) Take 1,000 Units by mouth daily.  . irbesartan (AVAPRO) 75 MG tablet Take 1 tablet (75 mg total) by mouth daily.  Marland Kitchen LUMIGAN 0.01 % SOLN Place 1 drop into both eyes at bedtime.   . metFORMIN (GLUCOPHAGE-XR) 500 MG 24 hr tablet Take 500 mg by mouth 2 (two) times  daily.   . Multiple Vitamins-Minerals (MULTIVITAMIN PO) Take 1 tablet by mouth daily.   . Omega-3 Fatty Acids (FISH OIL) 1000 MG CAPS Take 1,000 mg by mouth daily.   . pantoprazole (PROTONIX) 40 MG tablet Take 40 mg by mouth daily.   . rosuvastatin (CRESTOR) 10 MG tablet Take 10 mg by mouth daily.   . timolol (BETIMOL) 0.5 % ophthalmic solution Place 1 drop into the left eye 2 (two) times daily.  . traMADol (ULTRAM) 50 MG tablet Take 1 tablet (50 mg total) by mouth every 6 (six) hours as needed (mild pain).   No facility-administered encounter medications on file as of 02/18/2019.    Physical Exam: Blood pressure 122/76, pulse 70, temperature (!) 97.3 F (36.3 C), temperature source Temporal, weight (!) 302 lb (137 kg), SpO2 96 %. Gen:      No acute distress HEENT:  EOMI, sclera anicteric Neck:     No masses; no thyromegaly Lungs:    Clear to auscultation bilaterally; normal respiratory effort CV:         Regular rate and rhythm; no murmurs Abd:      + bowel sounds; soft, non-tender; no palpable masses, no distension Ext:    No edema; adequate peripheral perfusion Skin:      Warm and dry; no rash Neuro: alert and oriented x 3 Psych: normal mood and affect  Data Reviewed: Imaging: CT chest 10/29/2013- mild emphysema, coronary atherosclerosis.  Pulmonary  nodules CT chest 05/12/2015- pulmonary nodules, emphysema mild reticulation in the lung periphery CT chest 10/19/2016- stable pulmonary nodules, emphysema, reticulation in the peripheries CT chest 10/22/2017- emphysema, scattered pulmonary nodules, reticulation in the periphery CT chest 09/22/2018- mild reticulation with basilar gradient.  Minimal bronchiectasis.  Emphysema.  Stable pulmonary nodules.  No honeycombing.  I have reviewed the images personally.  PFTs:  01/14/2019 FVC 3.37 [74%], FEV1 2.77 [84%], F/F 82, TLC 4.27 [59%], DLCO 20.35 [76%] Moderate-severe restriction, minimal diffusion defect  Labs:  CTD serologies- SCL 70,  hypersensitivity panel, ANCA screen-negative  Assessment:  Evaluation for pulmonary fibrosis CT scan shows mild reticulation that appears to have progressed since 2015.   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 He will get rid of these as soon as possible Start him on prednisone 40 mg a day. He will monitor his blood sugar closely as he is diabetic We will review his pathology in detail at multidisciplinary conference  Follow-up in 2 to 4 weeks to review reassess  Plan/Recommendations: - Prednisone 40 mg a day - Allergen avoidance.  Get rid of feather questions - Review at multidisciplinary ILD conference  Marshell Garfinkel MD [facility] 02/18/2019, 12:14 PM  CC: [name], MD

## 2019-02-18 NOTE — Patient Instructions (Signed)
I reviewed your lung biopsy which shows findings consistent with a condition called hypersensitivity pneumonitis This can come from any inhalation exposure that causes a reaction in your lungs  We will start you on prednisone 40 mg a day.  Will also send in some supplies to check your blood sugars while you are on the prednisone We will give you a questionnaire to fill out to check for exposures Make sure that you get rid of any cushions with feathers in it We will check your oxygen levels on exertion  Follow-up in 4 weeks.

## 2019-02-19 ENCOUNTER — Other Ambulatory Visit: Payer: Self-pay | Admitting: Thoracic Surgery (Cardiothoracic Vascular Surgery)

## 2019-02-19 ENCOUNTER — Telehealth: Payer: Self-pay | Admitting: Pulmonary Disease

## 2019-02-19 DIAGNOSIS — J849 Interstitial pulmonary disease, unspecified: Secondary | ICD-10-CM

## 2019-02-19 NOTE — Telephone Encounter (Signed)
Called and spoke to pt. Pt questioned if he should continue taking all his other medications since he was just started on the prednisone. Advised pt to follow the recs per Dr. Vaughan Browner. Per pt's AVS (seen on 02/18/2019), pt was not taken off any medication. I read pt his AVS and told him to continue with his other medications. Pt verbalized understanding and denied any further questions or concerns at this time.

## 2019-02-20 ENCOUNTER — Other Ambulatory Visit: Payer: Self-pay

## 2019-02-20 ENCOUNTER — Encounter: Payer: Self-pay | Admitting: Thoracic Surgery (Cardiothoracic Vascular Surgery)

## 2019-02-20 ENCOUNTER — Ambulatory Visit (INDEPENDENT_AMBULATORY_CARE_PROVIDER_SITE_OTHER): Payer: Self-pay | Admitting: Thoracic Surgery (Cardiothoracic Vascular Surgery)

## 2019-02-20 ENCOUNTER — Ambulatory Visit
Admission: RE | Admit: 2019-02-20 | Discharge: 2019-02-20 | Disposition: A | Discharge status: Home / Self Care | Payer: Medicare Other | Source: Ambulatory Visit | Attending: Thoracic Surgery (Cardiothoracic Vascular Surgery) | Admitting: Thoracic Surgery (Cardiothoracic Vascular Surgery)

## 2019-02-20 VITALS — BP 160/80 | HR 68 | Temp 97.7°F | Resp 20 | Ht 73.0 in | Wt 302.0 lb

## 2019-02-20 DIAGNOSIS — J849 Interstitial pulmonary disease, unspecified: Secondary | ICD-10-CM

## 2019-02-20 DIAGNOSIS — J449 Chronic obstructive pulmonary disease, unspecified: Secondary | ICD-10-CM | POA: Diagnosis not present

## 2019-02-20 DIAGNOSIS — Z09 Encounter for follow-up examination after completed treatment for conditions other than malignant neoplasm: Secondary | ICD-10-CM

## 2019-02-20 NOTE — Progress Notes (Signed)
      [address]       [place] [address]             [phone]        Justin Atkinson Record [id number] Date of Birth: [birth year]  Referring: Marshell Garfinkel, MD Primary Care: [name], MD Primary Cardiologist:No primary care provider on file.  Reason for visit:   follow-up  History of Present Illness:     78 year old male presents for his first follow-up appointment after undergoing a right VATS wedge resection for diagnostic purposes.  He has no complaints.  He denies any significant changes to his respiratory status.  Physical Exam: BP (!) 160/80   Pulse 68   Temp 97.7 F (36.5 C) (Skin)   Resp 20   Ht 6\' 1"  (1.854 m)   Wt (!) 302 lb (137 kg)   SpO2 93% Comment: RA  BMI 39.84 kg/m   Alert NAD Patient is clean. Easy work of breathing. Chest tubes to treat.   Diagnostic Studies & Laboratory data:  Path:  A. LUNG, RIGHT UPPER LOBE, WEDGE RESECTION:  - Patchy interstitial fibrosis and few nonnecrotizing granulomas. See  comment   B. LUNG, RIGHT MIDDLE LOBE, WEDGE RESECTION:  - Lung parenchyma with mild emphysematous changes and rare  nonnecrotizing granulomas   C. LUNG, RIGHT LOWER LOBE, WEDGE RESECTION:  - Lung parenchyma with patchy, mild to moderate interstitial fibrosis,  bronchiolitis and multiple nonnecrotizing granulomas. See comment     Assessment / Plan:   78 year old male status post right VATS wedge resection, with mild to moderate interstitial fibrosis.  He is recovering well from his operation.  He has had his follow-up with his pulmonologist.   Follow-up with CTS as needed  Lajuana Matte 02/20/2019 3:09 PM

## 2019-02-24 ENCOUNTER — Ambulatory Visit (INDEPENDENT_AMBULATORY_CARE_PROVIDER_SITE_OTHER): Payer: Medicare Other | Admitting: Primary Care

## 2019-02-24 ENCOUNTER — Telehealth: Payer: Self-pay | Admitting: Primary Care

## 2019-02-24 ENCOUNTER — Telehealth: Payer: Self-pay | Admitting: Pulmonary Disease

## 2019-02-24 ENCOUNTER — Ambulatory Visit: Payer: Self-pay | Admitting: *Deleted

## 2019-02-24 ENCOUNTER — Other Ambulatory Visit: Payer: Self-pay

## 2019-02-24 DIAGNOSIS — J849 Interstitial pulmonary disease, unspecified: Secondary | ICD-10-CM

## 2019-02-24 NOTE — Telephone Encounter (Signed)
-----   Message from Marshell Garfinkel, MD sent at 02/24/2019  3:48 PM EST ----- Regarding: RE: Elevated BP on prednisone Can reduce dose to 20mg /day ----- Message ----- From: Martyn Ehrich, NP Sent: 02/24/2019   3:32 PM EST To: Marshell Garfinkel, MD Subject: Elevated BP on prednisone                      ILD patient whom you started of prednisone recently 40mg  for HP. He had some initial insomnia which has improved. BS have been running 150-200 with highest 288. Only corcner was BP elevation 180/136 x1 yesterday. He had a normal BP today 130/70. Told him to monitor  Are you ok with continuing prednisone?? Or do you want to decrease or stop d/t BP   -beth

## 2019-02-24 NOTE — Telephone Encounter (Addendum)
Called and spoke with pt who stated he has been having complaints of wheezing/SOB, headaches, bodyaches, and elevated BP which all began after O2 was turned in on 11/12. Pt said symptoms began the following week after the O2 had been turned back in.  Pt states he also believes that he has a headcold which began after coming home from the hospital 11/11 and states he has had symptoms listed above ever since. Pt has been taking airborne to see if it would help with his symptoms and pt states that it has.   Pt states that his BP has been elevated as yesterday it was 180/136. Stated to pt that we would schedule a televisit to further evaluate and he verbalized understanding. televisit scheduled with Beth today. Nothing further needed.

## 2019-02-24 NOTE — Telephone Encounter (Signed)
Called and spoke with pt letting him know the info stated by [facility] that Dr. Vaughan Browner said to reduce prednisone to 20mg  daily. Pt verbalized understanding. nothing further needed.

## 2019-02-24 NOTE — Progress Notes (Signed)
Virtual Visit via Telephone Note  I connected with Justin Atkinson. on 02/24/19 at  3:00 PM EST by telephone and verified that I am speaking with the correct person using two identifiers.  Location: Patient: Home`  Provider: Home   I discussed the limitations, risks, security and privacy concerns of performing an evaluation and management service by telephone and the availability of in person appointments. I also discussed with the patient that there may be a patient responsible charge related to this service. The patient expressed understanding and agreed to proceed.   History of Present Illness: 78 year old male, former smoker. PMH ILD, emphysema, GERD, bladder cancer. Patient of Dr. Vaughan Browner, last seen on 02/18/19. Referred back in August 2020 for fibrosis seen on CT. CT chest shows mild reticulation that appears progressive since 2015. Reports dyspnea on exertion. Underwent surgical lung biopsy findings consistent with hypersensitivity pneumonitis. Possible down feather exposure. Started on 40mg  prednisone daily. He has been told he has COPD but no PFTs on record. Currently maintained on Anoro. FU in 2-4 weeks.  02/24/2019 Patient contacted today for acute televisit. Reports elevated blood pressure, insomnia and chest cold symptoms. Patient was started on prednisone on Nov 28th per Dr. Vaughan Browner for suspected HP. Reports difficulty sleeping d/t steroids which has gradually improved. BP 180/136 on one occasional, other readings have been within normal limits. BP checked today at home and reported 133/70. Checking his BS three times a day which have been running between 150-200 with the highest at 288. During last office visit his oxygen was discontinue, he maintained 93% O2 level on room air. Feels he may still need oxygen. Reports O2 levels at home have been between 89-95%. He has had a slight runny nose that developed into head and chest cold. He was tested for covid on 02/05/19 before surgical  biopsy and results were negative. His breathing has been better since being on oral steroids. Denies fever.   Observations/Objective:  - No shortness of breath, wheezing or cough noted during phone conversation  Assessment and Plan:  ILD: - Surgical biopsy result suggestive of HP - Started on prednisone 40mg  on 11/28 with reported side effects - Discussed with Dr. Vaughan Browner, plan decreased prednisone to 20mg  daily - continue to monitor BP, Blood sugars and O2 levels - Consider repeat ambulatory O2 walk at next visit  - FU scheduled for 03/17/19   Follow Up Instructions:   - Fu December 15th or sooner if needed  I discussed the assessment and treatment plan with the patient. The patient was provided an opportunity to ask questions and all were answered. The patient agreed with the plan and demonstrated an understanding of the instructions.   The patient was advised to call back or seek an in-person evaluation if the symptoms worsen or if the condition fails to improve as anticipated.  I provided 18 minutes of non-face-to-face time during this encounter.   Martyn Ehrich, NP

## 2019-02-24 NOTE — Telephone Encounter (Signed)
Please let patient know I spoke with Dr. Vaughan Browner and he recommended decreasing to 20mg  daily

## 2019-02-25 ENCOUNTER — Other Ambulatory Visit: Payer: Self-pay | Admitting: *Deleted

## 2019-02-25 ENCOUNTER — Encounter: Payer: Self-pay | Admitting: Primary Care

## 2019-02-25 NOTE — Patient Outreach (Signed)
[facility] [facility]) Care Management  02/25/2019  Hersh Vasilopoulos. Aug 05, 1940 [id number]   Subjective: Telephone call to patient's home number, spoke with patient, and HIPAA verified.  States he remembers speaking with this RNCM in the past, is currently in a caregiver meeting, and requested call back at a later time.     Objective: Per KPN (Knowledge Performance Now, point of care tool) and chart review, patient hospitalized 02/09/2019 -02/11/2019 for Postinflammatory pulmonary fibrosis, ILD (interstitial lung disease), status post  Bronchoscopy,  RightVideo assisted thoracoscopy, Right upper, middle, and lower lobe wedge resection, Intercostal nerve block on 02/09/2019.     Patient also has a history of renal cyst, Infected sebaceous cyst of skin, Malignant neoplasm of lateral wall of urinary bladder, Inguinal hernia unilateral, Benign prostate hyperplasia, Tobacco abuse, Hypertension, and Hypercholesterolemia.        Assessment: Received Medicare [facility] Liaison referral on 02/12/2019.  Referral source: Dannielle Huh.   Referral reason: complex care management related to respiratory status from pulmonary fibrosis status post VATS and hx.COPD, new to oxygen use at home; and to assess further needs post discharge, diagnosis COPD.    Screening  follow up pending patient contact.     Plan: RNCM has sent unsuccessful outreach  letter, [facility] pamphlet, will call patient for 3rd telephone outreach attempt within 4 business days (per patient's request), screening follow up, and will proceed with case closure within 10 business days if no return call.    Esley Brooking [name], BSN, [place] [facility] Telephonic CM Phone: [phone] Fax: [phone]

## 2019-02-25 NOTE — Patient Instructions (Signed)
Decreased prednisone 20mg  daily Monitor BP, blood sugar and O2 level- report if abnormal FU December 15th with Dr. Vaughan Browner

## 2019-02-27 ENCOUNTER — Emergency Department (HOSPITAL_COMMUNITY): Payer: Medicare Other

## 2019-02-27 ENCOUNTER — Inpatient Hospital Stay (HOSPITAL_COMMUNITY)
Admission: EM | Admit: 2019-02-27 | Discharge: 2019-05-04 | DRG: 177 | Disposition: E | Payer: Medicare Other | Attending: Internal Medicine | Admitting: Internal Medicine

## 2019-02-27 ENCOUNTER — Other Ambulatory Visit: Payer: Self-pay

## 2019-02-27 ENCOUNTER — Encounter (HOSPITAL_COMMUNITY): Payer: Self-pay

## 2019-02-27 ENCOUNTER — Telehealth: Payer: Self-pay | Admitting: Primary Care

## 2019-02-27 ENCOUNTER — Inpatient Hospital Stay (HOSPITAL_COMMUNITY): Payer: Medicare Other

## 2019-02-27 DIAGNOSIS — Z515 Encounter for palliative care: Secondary | ICD-10-CM | POA: Diagnosis not present

## 2019-02-27 DIAGNOSIS — Z209 Contact with and (suspected) exposure to unspecified communicable disease: Secondary | ICD-10-CM | POA: Diagnosis not present

## 2019-02-27 DIAGNOSIS — M1990 Unspecified osteoarthritis, unspecified site: Secondary | ICD-10-CM | POA: Diagnosis present

## 2019-02-27 DIAGNOSIS — J8410 Pulmonary fibrosis, unspecified: Secondary | ICD-10-CM | POA: Diagnosis not present

## 2019-02-27 DIAGNOSIS — Z801 Family history of malignant neoplasm of trachea, bronchus and lung: Secondary | ICD-10-CM

## 2019-02-27 DIAGNOSIS — N400 Enlarged prostate without lower urinary tract symptoms: Secondary | ICD-10-CM | POA: Diagnosis present

## 2019-02-27 DIAGNOSIS — D696 Thrombocytopenia, unspecified: Secondary | ICD-10-CM | POA: Diagnosis not present

## 2019-02-27 DIAGNOSIS — H409 Unspecified glaucoma: Secondary | ICD-10-CM | POA: Diagnosis present

## 2019-02-27 DIAGNOSIS — Z7984 Long term (current) use of oral hypoglycemic drugs: Secondary | ICD-10-CM | POA: Diagnosis not present

## 2019-02-27 DIAGNOSIS — R05 Cough: Secondary | ICD-10-CM | POA: Diagnosis not present

## 2019-02-27 DIAGNOSIS — G92 Toxic encephalopathy: Secondary | ICD-10-CM | POA: Diagnosis not present

## 2019-02-27 DIAGNOSIS — Z87891 Personal history of nicotine dependence: Secondary | ICD-10-CM

## 2019-02-27 DIAGNOSIS — U071 COVID-19: Principal | ICD-10-CM | POA: Diagnosis present

## 2019-02-27 DIAGNOSIS — I110 Hypertensive heart disease with heart failure: Secondary | ICD-10-CM | POA: Diagnosis present

## 2019-02-27 DIAGNOSIS — J679 Hypersensitivity pneumonitis due to unspecified organic dust: Secondary | ICD-10-CM | POA: Diagnosis not present

## 2019-02-27 DIAGNOSIS — N179 Acute kidney failure, unspecified: Secondary | ICD-10-CM | POA: Diagnosis not present

## 2019-02-27 DIAGNOSIS — Z7952 Long term (current) use of systemic steroids: Secondary | ICD-10-CM

## 2019-02-27 DIAGNOSIS — Z7982 Long term (current) use of aspirin: Secondary | ICD-10-CM | POA: Diagnosis not present

## 2019-02-27 DIAGNOSIS — R0602 Shortness of breath: Secondary | ICD-10-CM | POA: Diagnosis not present

## 2019-02-27 DIAGNOSIS — R230 Cyanosis: Secondary | ICD-10-CM | POA: Diagnosis not present

## 2019-02-27 DIAGNOSIS — R0689 Other abnormalities of breathing: Secondary | ICD-10-CM | POA: Diagnosis not present

## 2019-02-27 DIAGNOSIS — Z781 Physical restraint status: Secondary | ICD-10-CM

## 2019-02-27 DIAGNOSIS — F419 Anxiety disorder, unspecified: Secondary | ICD-10-CM | POA: Diagnosis present

## 2019-02-27 DIAGNOSIS — J9621 Acute and chronic respiratory failure with hypoxia: Secondary | ICD-10-CM | POA: Diagnosis present

## 2019-02-27 DIAGNOSIS — J449 Chronic obstructive pulmonary disease, unspecified: Secondary | ICD-10-CM | POA: Diagnosis present

## 2019-02-27 DIAGNOSIS — R680 Hypothermia, not associated with low environmental temperature: Secondary | ICD-10-CM | POA: Diagnosis not present

## 2019-02-27 DIAGNOSIS — A419 Sepsis, unspecified organism: Secondary | ICD-10-CM | POA: Diagnosis not present

## 2019-02-27 DIAGNOSIS — E7800 Pure hypercholesterolemia, unspecified: Secondary | ICD-10-CM | POA: Diagnosis present

## 2019-02-27 DIAGNOSIS — J9811 Atelectasis: Secondary | ICD-10-CM | POA: Diagnosis not present

## 2019-02-27 DIAGNOSIS — E11649 Type 2 diabetes mellitus with hypoglycemia without coma: Secondary | ICD-10-CM | POA: Diagnosis not present

## 2019-02-27 DIAGNOSIS — I10 Essential (primary) hypertension: Secondary | ICD-10-CM | POA: Diagnosis present

## 2019-02-27 DIAGNOSIS — R918 Other nonspecific abnormal finding of lung field: Secondary | ICD-10-CM | POA: Diagnosis not present

## 2019-02-27 DIAGNOSIS — Q600 Renal agenesis, unilateral: Secondary | ICD-10-CM | POA: Diagnosis not present

## 2019-02-27 DIAGNOSIS — Z79899 Other long term (current) drug therapy: Secondary | ICD-10-CM

## 2019-02-27 DIAGNOSIS — R1310 Dysphagia, unspecified: Secondary | ICD-10-CM

## 2019-02-27 DIAGNOSIS — I4891 Unspecified atrial fibrillation: Secondary | ICD-10-CM | POA: Diagnosis not present

## 2019-02-27 DIAGNOSIS — Z9981 Dependence on supplemental oxygen: Secondary | ICD-10-CM | POA: Diagnosis not present

## 2019-02-27 DIAGNOSIS — Z8551 Personal history of malignant neoplasm of bladder: Secondary | ICD-10-CM

## 2019-02-27 DIAGNOSIS — R609 Edema, unspecified: Secondary | ICD-10-CM | POA: Diagnosis not present

## 2019-02-27 DIAGNOSIS — J690 Pneumonitis due to inhalation of food and vomit: Secondary | ICD-10-CM | POA: Diagnosis not present

## 2019-02-27 DIAGNOSIS — R5381 Other malaise: Secondary | ICD-10-CM | POA: Diagnosis not present

## 2019-02-27 DIAGNOSIS — I5033 Acute on chronic diastolic (congestive) heart failure: Secondary | ICD-10-CM | POA: Diagnosis not present

## 2019-02-27 DIAGNOSIS — J9601 Acute respiratory failure with hypoxia: Secondary | ICD-10-CM | POA: Diagnosis not present

## 2019-02-27 DIAGNOSIS — J431 Panlobular emphysema: Secondary | ICD-10-CM | POA: Diagnosis not present

## 2019-02-27 DIAGNOSIS — J189 Pneumonia, unspecified organism: Secondary | ICD-10-CM | POA: Diagnosis not present

## 2019-02-27 DIAGNOSIS — F05 Delirium due to known physiological condition: Secondary | ICD-10-CM | POA: Diagnosis not present

## 2019-02-27 DIAGNOSIS — J849 Interstitial pulmonary disease, unspecified: Secondary | ICD-10-CM | POA: Diagnosis not present

## 2019-02-27 DIAGNOSIS — E1165 Type 2 diabetes mellitus with hyperglycemia: Secondary | ICD-10-CM | POA: Diagnosis not present

## 2019-02-27 DIAGNOSIS — J1282 Pneumonia due to coronavirus disease 2019: Secondary | ICD-10-CM | POA: Diagnosis present

## 2019-02-27 DIAGNOSIS — I371 Nonrheumatic pulmonary valve insufficiency: Secondary | ICD-10-CM | POA: Diagnosis not present

## 2019-02-27 DIAGNOSIS — J069 Acute upper respiratory infection, unspecified: Secondary | ICD-10-CM | POA: Diagnosis not present

## 2019-02-27 DIAGNOSIS — X58XXXA Exposure to other specified factors, initial encounter: Secondary | ICD-10-CM | POA: Diagnosis present

## 2019-02-27 DIAGNOSIS — F329 Major depressive disorder, single episode, unspecified: Secondary | ICD-10-CM | POA: Diagnosis present

## 2019-02-27 DIAGNOSIS — E118 Type 2 diabetes mellitus with unspecified complications: Secondary | ICD-10-CM | POA: Diagnosis present

## 2019-02-27 DIAGNOSIS — J439 Emphysema, unspecified: Secondary | ICD-10-CM | POA: Diagnosis not present

## 2019-02-27 DIAGNOSIS — E785 Hyperlipidemia, unspecified: Secondary | ICD-10-CM | POA: Diagnosis present

## 2019-02-27 DIAGNOSIS — Z66 Do not resuscitate: Secondary | ICD-10-CM | POA: Diagnosis present

## 2019-02-27 DIAGNOSIS — A4189 Other specified sepsis: Secondary | ICD-10-CM | POA: Diagnosis not present

## 2019-02-27 DIAGNOSIS — R0902 Hypoxemia: Secondary | ICD-10-CM | POA: Diagnosis not present

## 2019-02-27 DIAGNOSIS — I700 Atherosclerosis of aorta: Secondary | ICD-10-CM | POA: Diagnosis present

## 2019-02-27 DIAGNOSIS — Y95 Nosocomial condition: Secondary | ICD-10-CM | POA: Diagnosis present

## 2019-02-27 DIAGNOSIS — J1289 Other viral pneumonia: Secondary | ICD-10-CM | POA: Diagnosis not present

## 2019-02-27 DIAGNOSIS — J432 Centrilobular emphysema: Secondary | ICD-10-CM | POA: Diagnosis present

## 2019-02-27 DIAGNOSIS — R6520 Severe sepsis without septic shock: Secondary | ICD-10-CM | POA: Diagnosis present

## 2019-02-27 DIAGNOSIS — T797XXA Traumatic subcutaneous emphysema, initial encounter: Secondary | ICD-10-CM | POA: Diagnosis present

## 2019-02-27 DIAGNOSIS — E876 Hypokalemia: Secondary | ICD-10-CM | POA: Diagnosis not present

## 2019-02-27 DIAGNOSIS — G4700 Insomnia, unspecified: Secondary | ICD-10-CM | POA: Diagnosis not present

## 2019-02-27 LAB — RESPIRATORY PANEL BY PCR

## 2019-02-27 LAB — CBC WITH DIFFERENTIAL/PLATELET
Abs Immature Granulocytes: 0.04 10*3/uL (ref 0.00–0.07)
Basophils Absolute: 0 10*3/uL (ref 0.0–0.1)
Basophils Relative: 0 %
Eosinophils Absolute: 0 10*3/uL (ref 0.0–0.5)
Eosinophils Relative: 0 %
HCT: 41.3 % (ref 39.0–52.0)
Hemoglobin: 13.4 g/dL (ref 13.0–17.0)
Immature Granulocytes: 1 %
Lymphocytes Relative: 6 %
Lymphs Abs: 0.6 10*3/uL — ABNORMAL LOW (ref 0.7–4.0)
MCH: 30.7 pg (ref 26.0–34.0)
MCHC: 32.4 g/dL (ref 30.0–36.0)
MCV: 94.7 fL (ref 80.0–100.0)
Monocytes Absolute: 0.7 10*3/uL (ref 0.1–1.0)
Monocytes Relative: 8 %
Neutro Abs: 7.5 10*3/uL (ref 1.7–7.7)
Neutrophils Relative %: 85 %
Platelets: 257 10*3/uL (ref 150–400)
RBC: 4.36 MIL/uL (ref 4.22–5.81)
RDW: 13.3 % (ref 11.5–15.5)
WBC: 8.9 10*3/uL (ref 4.0–10.5)
nRBC: 0 % (ref 0.0–0.2)

## 2019-02-27 LAB — COMPREHENSIVE METABOLIC PANEL
ALT: 48 U/L — ABNORMAL HIGH (ref 0–44)
AST: 40 U/L (ref 15–41)
Albumin: 3.3 g/dL — ABNORMAL LOW (ref 3.5–5.0)
Alkaline Phosphatase: 72 U/L (ref 38–126)
Anion gap: 10 (ref 5–15)
BUN: 24 mg/dL — ABNORMAL HIGH (ref 8–23)
CO2: 24 mmol/L (ref 22–32)
Calcium: 8.5 mg/dL — ABNORMAL LOW (ref 8.9–10.3)
Chloride: 102 mmol/L (ref 98–111)
Creatinine, Ser: 0.88 mg/dL (ref 0.61–1.24)
GFR calc Af Amer: >60 mL/min (ref >60)
GFR calc non Af Amer: >60 mL/min (ref >60)
Glucose, Bld: 251 mg/dL — ABNORMAL HIGH (ref 70–99)
Potassium: 3.9 mmol/L (ref 3.5–5.1)
Sodium: 136 mmol/L (ref 135–145)
Total Bilirubin: 1 mg/dL (ref 0.3–1.2)
Total Protein: 6.7 g/dL (ref 6.5–8.1)

## 2019-02-27 LAB — BLOOD GAS, ARTERIAL
Acid-Base Excess: 1.2 mmol/L (ref 0.0–2.0)
Bicarbonate: 24.9 mmol/L (ref 20.0–28.0)
O2 Saturation: 88.1 %
Patient temperature: 98.6
pCO2 arterial: 38.7 mmHg (ref 32.0–48.0)
pH, Arterial: 7.425 (ref 7.350–7.450)
pO2, Arterial: 57.2 mmHg — ABNORMAL LOW (ref 83.0–108.0)

## 2019-02-27 LAB — TRIGLYCERIDES: Triglycerides: 97 mg/dL (ref <150)

## 2019-02-27 LAB — URINALYSIS, COMPLETE (UACMP) WITH MICROSCOPIC
Bacteria, UA: NONE SEEN
Bilirubin Urine: NEGATIVE
Glucose, UA: NEGATIVE mg/dL
Hgb urine dipstick: NEGATIVE
Ketones, ur: NEGATIVE mg/dL
Leukocytes,Ua: NEGATIVE
Nitrite: NEGATIVE
Protein, ur: NEGATIVE mg/dL
Specific Gravity, Urine: >1.046 — ABNORMAL HIGH (ref 1.005–1.030)
pH: 6 (ref 5.0–8.0)

## 2019-02-27 LAB — SARS CORONAVIRUS 2 (TAT 6-24 HRS): SARS Coronavirus 2: POSITIVE — AB

## 2019-02-27 LAB — D-DIMER, QUANTITATIVE: D-Dimer, Quant: 1.32 ug/mL-FEU — ABNORMAL HIGH (ref 0.00–0.50)

## 2019-02-27 LAB — PROCALCITONIN: Procalcitonin: 0.13 ng/mL

## 2019-02-27 LAB — FIBRINOGEN: Fibrinogen: 621 mg/dL — ABNORMAL HIGH (ref 210–475)

## 2019-02-27 LAB — MRSA PCR SCREENING: MRSA by PCR: NEGATIVE

## 2019-02-27 LAB — LACTIC ACID, PLASMA
Lactic Acid, Venous: 2.3 mmol/L (ref 0.5–1.9)
Lactic Acid, Venous: 1.4 mmol/L (ref 0.5–1.9)

## 2019-02-27 LAB — GLUCOSE, CAPILLARY
Glucose-Capillary: 186 mg/dL — ABNORMAL HIGH (ref 70–99)
Glucose-Capillary: 206 mg/dL — ABNORMAL HIGH (ref 70–99)

## 2019-02-27 LAB — BRAIN NATRIURETIC PEPTIDE: B Natriuretic Peptide: 147 pg/mL — ABNORMAL HIGH (ref 0.0–100.0)

## 2019-02-27 LAB — C-REACTIVE PROTEIN: CRP: 11.7 mg/dL — ABNORMAL HIGH (ref <1.0)

## 2019-02-27 LAB — FERRITIN: Ferritin: 756 ng/mL — ABNORMAL HIGH (ref 24–336)

## 2019-02-27 LAB — LACTATE DEHYDROGENASE: LDH: 387 U/L — ABNORMAL HIGH (ref 98–192)

## 2019-02-27 LAB — POC SARS CORONAVIRUS 2 AG -  ED: SARS Coronavirus 2 Ag: NEGATIVE

## 2019-02-27 MED ORDER — ACETAMINOPHEN 325 MG PO TABS
650.0000 mg | ORAL_TABLET | ORAL | Status: DC | PRN
  Administered 2019-03-04 – 2019-04-05 (×28): 650 mg via ORAL
  Filled 2019-02-27 (×29): qty 2

## 2019-02-27 MED ORDER — ENOXAPARIN SODIUM 60 MG/0.6ML ~~LOC~~ SOLN
60.0000 mg | Freq: Two times a day (BID) | SUBCUTANEOUS | Status: DC
  Administered 2019-02-27: 60 mg via SUBCUTANEOUS
  Filled 2019-02-27 (×2): qty 0.6

## 2019-02-27 MED ORDER — TIMOLOL HEMIHYDRATE 0.5 % OP SOLN: 1.0000 [drp] | Freq: Two times a day (BID) | OPHTHALMIC | Status: DC

## 2019-02-27 MED ORDER — ALBUTEROL SULFATE HFA 108 (90 BASE) MCG/ACT IN AERS
2.0000 | INHALATION_SPRAY | RESPIRATORY_TRACT | Status: DC | PRN
  Administered 2019-03-01 – 2019-04-03 (×2): 2 via RESPIRATORY_TRACT
  Filled 2019-02-27: qty 6.7

## 2019-02-27 MED ORDER — ROSUVASTATIN CALCIUM 5 MG PO TABS
10.0000 mg | ORAL_TABLET | Freq: Every day | ORAL | Status: DC
  Administered 2019-02-27 – 2019-04-06 (×39): 10 mg via ORAL
  Filled 2019-02-27 (×8): qty 2
  Filled 2019-02-27 (×2): qty 1
  Filled 2019-02-27 (×4): qty 2
  Filled 2019-02-27: qty 1
  Filled 2019-02-27 (×2): qty 2
  Filled 2019-02-27 (×2): qty 1
  Filled 2019-02-27 (×3): qty 2
  Filled 2019-02-27 (×2): qty 1
  Filled 2019-02-27 (×3): qty 2
  Filled 2019-02-27: qty 1
  Filled 2019-02-27 (×8): qty 2
  Filled 2019-02-27 (×3): qty 1
  Filled 2019-02-27: qty 2
  Filled 2019-02-27 (×2): qty 1

## 2019-02-27 MED ORDER — SODIUM CHLORIDE 0.9 % IV SOLN
2.0000 g | Freq: Three times a day (TID) | INTRAVENOUS | Status: DC
  Administered 2019-02-27 – 2019-03-03 (×11): 2 g via INTRAVENOUS
  Filled 2019-02-27 (×11): qty 2

## 2019-02-27 MED ORDER — ASPIRIN EC 81 MG PO TBEC
81.0000 mg | DELAYED_RELEASE_TABLET | Freq: Every day | ORAL | Status: DC
  Administered 2019-02-27 – 2019-04-06 (×39): 81 mg via ORAL
  Filled 2019-02-27 (×40): qty 1

## 2019-02-27 MED ORDER — FUROSEMIDE 10 MG/ML IJ SOLN
40.0000 mg | Freq: Once | INTRAMUSCULAR | Status: AC
  Administered 2019-02-27: 40 mg via INTRAVENOUS
  Filled 2019-02-27: qty 4

## 2019-02-27 MED ORDER — LORATADINE 10 MG PO TABS
10.0000 mg | ORAL_TABLET | Freq: Every day | ORAL | Status: DC
  Administered 2019-02-27 – 2019-04-06 (×39): 10 mg via ORAL
  Filled 2019-02-27 (×41): qty 1

## 2019-02-27 MED ORDER — VANCOMYCIN HCL 10 G IV SOLR
2000.0000 mg | Freq: Once | INTRAVENOUS | Status: AC
  Administered 2019-02-27: 2000 mg via INTRAVENOUS
  Filled 2019-02-27: qty 2000

## 2019-02-27 MED ORDER — ENOXAPARIN SODIUM 60 MG/0.6ML ~~LOC~~ SOLN
60.0000 mg | SUBCUTANEOUS | Status: DC
  Filled 2019-02-27: qty 0.6

## 2019-02-27 MED ORDER — SODIUM CHLORIDE 0.9 % IV SOLN
2.0000 g | Freq: Once | INTRAVENOUS | Status: AC
  Administered 2019-02-27: 2 g via INTRAVENOUS
  Filled 2019-02-27: qty 2

## 2019-02-27 MED ORDER — PANTOPRAZOLE SODIUM 40 MG PO TBEC
40.0000 mg | DELAYED_RELEASE_TABLET | Freq: Every day | ORAL | Status: DC
  Administered 2019-02-27 – 2019-04-06 (×39): 40 mg via ORAL
  Filled 2019-02-27 (×38): qty 1

## 2019-02-27 MED ORDER — UMECLIDINIUM-VILANTEROL 62.5-25 MCG/INH IN AEPB: 1.0000 | INHALATION_SPRAY | Freq: Every day | RESPIRATORY_TRACT | Status: DC

## 2019-02-27 MED ORDER — VANCOMYCIN HCL 10 G IV SOLR
1250.0000 mg | Freq: Two times a day (BID) | INTRAVENOUS | Status: DC
  Filled 2019-02-27: qty 1250

## 2019-02-27 MED ORDER — GUAIFENESIN ER 600 MG PO TB12
1200.0000 mg | ORAL_TABLET | Freq: Two times a day (BID) | ORAL | Status: DC
  Administered 2019-02-27 – 2019-04-06 (×75): 1200 mg via ORAL
  Filled 2019-02-27 (×80): qty 2

## 2019-02-27 MED ORDER — METHYLPREDNISOLONE SODIUM SUCC 125 MG IJ SOLR
60.0000 mg | Freq: Two times a day (BID) | INTRAMUSCULAR | Status: DC
  Administered 2019-02-27 – 2019-03-04 (×10): 60 mg via INTRAVENOUS
  Filled 2019-02-27 (×11): qty 2

## 2019-02-27 MED ORDER — UMECLIDINIUM-VILANTEROL 62.5-25 MCG/INH IN AEPB
1.0000 | INHALATION_SPRAY | Freq: Every day | RESPIRATORY_TRACT | Status: DC
  Administered 2019-03-01 – 2019-04-06 (×35): 1 via RESPIRATORY_TRACT
  Filled 2019-02-27 (×5): qty 14

## 2019-02-27 MED ORDER — FLUTICASONE PROPIONATE 50 MCG/ACT NA SUSP
2.0000 | Freq: Every day | NASAL | Status: DC
  Administered 2019-02-27 – 2019-04-06 (×38): 2 via NASAL
  Filled 2019-02-27: qty 16

## 2019-02-27 MED ORDER — IOHEXOL 350 MG/ML SOLN
100.0000 mL | Freq: Once | INTRAVENOUS | Status: AC | PRN
  Administered 2019-02-27: 100 mL via INTRAVENOUS

## 2019-02-27 MED ORDER — TIMOLOL MALEATE 0.5 % OP SOLN
1.0000 [drp] | Freq: Two times a day (BID) | OPHTHALMIC | Status: DC
  Administered 2019-02-27 – 2019-04-06 (×75): 1 [drp] via OPHTHALMIC
  Filled 2019-02-27: qty 5

## 2019-02-27 MED ORDER — OMEGA-3-ACID ETHYL ESTERS 1 G PO CAPS
1.0000 g | ORAL_CAPSULE | Freq: Every day | ORAL | Status: DC
  Administered 2019-02-27 – 2019-04-06 (×39): 1 g via ORAL
  Filled 2019-02-27 (×45): qty 1

## 2019-02-27 MED ORDER — IRBESARTAN 75 MG PO TABS
75.0000 mg | ORAL_TABLET | Freq: Every day | ORAL | Status: DC
  Administered 2019-02-27 – 2019-03-05 (×7): 75 mg via ORAL
  Filled 2019-02-27 (×7): qty 1

## 2019-02-27 MED ORDER — CLONIDINE HCL 0.1 MG PO TABS
0.2000 mg | ORAL_TABLET | Freq: Once | ORAL | Status: AC
  Administered 2019-02-27: 0.2 mg via ORAL
  Filled 2019-02-27: qty 2

## 2019-02-27 MED ORDER — LATANOPROST 0.005 % OP SOLN
1.0000 [drp] | Freq: Every day | OPHTHALMIC | Status: DC
  Administered 2019-02-27 – 2019-04-05 (×38): 1 [drp] via OPHTHALMIC
  Filled 2019-02-27: qty 2.5

## 2019-02-27 MED ORDER — DOXYCYCLINE HYCLATE 100 MG PO TABS: 100.0000 mg | ORAL_TABLET | Freq: Two times a day (BID) | ORAL | 0 refills | Status: AC

## 2019-02-27 MED ORDER — PREDNISONE 20 MG PO TABS: 20.0000 mg | ORAL_TABLET | Freq: Every day | ORAL | Status: DC

## 2019-02-27 MED ORDER — INSULIN ASPART 100 UNIT/ML ~~LOC~~ SOLN
0.0000 [IU] | Freq: Three times a day (TID) | SUBCUTANEOUS | Status: DC
  Administered 2019-02-27: 4 [IU] via SUBCUTANEOUS
  Administered 2019-02-28: 7 [IU] via SUBCUTANEOUS
  Administered 2019-02-28: 11 [IU] via SUBCUTANEOUS
  Administered 2019-02-28 – 2019-03-02 (×6): 7 [IU] via SUBCUTANEOUS
  Administered 2019-03-02: 11 [IU] via SUBCUTANEOUS
  Administered 2019-03-03: 7 [IU] via SUBCUTANEOUS
  Administered 2019-03-03 (×2): 11 [IU] via SUBCUTANEOUS
  Administered 2019-03-04 (×2): 4 [IU] via SUBCUTANEOUS
  Administered 2019-03-04 – 2019-03-05 (×2): 7 [IU] via SUBCUTANEOUS
  Administered 2019-03-05: 4 [IU] via SUBCUTANEOUS
  Administered 2019-03-05: 13:00:00 15 [IU] via SUBCUTANEOUS
  Administered 2019-03-06: 7 [IU] via SUBCUTANEOUS
  Administered 2019-03-06: 4 [IU] via SUBCUTANEOUS
  Administered 2019-03-06: 15 [IU] via SUBCUTANEOUS
  Administered 2019-03-07: 14:00:00 7 [IU] via SUBCUTANEOUS
  Administered 2019-03-07: 10:00:00 3 [IU] via SUBCUTANEOUS
  Administered 2019-03-07 – 2019-03-08 (×2): 7 [IU] via SUBCUTANEOUS
  Administered 2019-03-08: 4 [IU] via SUBCUTANEOUS
  Administered 2019-03-08: 7 [IU] via SUBCUTANEOUS
  Administered 2019-03-09: 3 [IU] via SUBCUTANEOUS
  Administered 2019-03-09: 12:00:00 7 [IU] via SUBCUTANEOUS
  Administered 2019-03-09: 20:00:00 4 [IU] via SUBCUTANEOUS
  Administered 2019-03-10 – 2019-03-11 (×3): 7 [IU] via SUBCUTANEOUS
  Administered 2019-03-11: 4 [IU] via SUBCUTANEOUS
  Administered 2019-03-12 (×2): 7 [IU] via SUBCUTANEOUS
  Administered 2019-03-13: 4 [IU] via SUBCUTANEOUS
  Administered 2019-03-13 – 2019-03-14 (×2): 7 [IU] via SUBCUTANEOUS
  Administered 2019-03-14 – 2019-03-15 (×2): 4 [IU] via SUBCUTANEOUS
  Administered 2019-03-15 – 2019-03-16 (×2): 7 [IU] via SUBCUTANEOUS
  Administered 2019-03-16 – 2019-03-17 (×2): 3 [IU] via SUBCUTANEOUS
  Administered 2019-03-17 – 2019-03-18 (×3): 4 [IU] via SUBCUTANEOUS
  Administered 2019-03-18: 3 [IU] via SUBCUTANEOUS
  Administered 2019-03-18: 18:00:00 7 [IU] via SUBCUTANEOUS
  Administered 2019-03-19: 12:00:00 4 [IU] via SUBCUTANEOUS
  Administered 2019-03-19: 7 [IU] via SUBCUTANEOUS
  Administered 2019-03-20: 4 [IU] via SUBCUTANEOUS
  Administered 2019-03-20: 7 [IU] via SUBCUTANEOUS
  Administered 2019-03-21 (×3): 4 [IU] via SUBCUTANEOUS
  Administered 2019-03-22 (×2): 3 [IU] via SUBCUTANEOUS
  Administered 2019-03-22: 7 [IU] via SUBCUTANEOUS
  Administered 2019-03-23: 4 [IU] via SUBCUTANEOUS
  Administered 2019-03-23: 7 [IU] via SUBCUTANEOUS
  Administered 2019-03-24 – 2019-03-25 (×4): 4 [IU] via SUBCUTANEOUS
  Administered 2019-03-25: 11 [IU] via SUBCUTANEOUS
  Administered 2019-03-26: 4 [IU] via SUBCUTANEOUS
  Administered 2019-03-26 – 2019-03-27 (×2): 7 [IU] via SUBCUTANEOUS
  Filled 2019-02-27: qty 0.2

## 2019-02-27 MED ORDER — ONDANSETRON HCL 4 MG/2ML IJ SOLN: 4.0000 mg | Freq: Four times a day (QID) | INTRAMUSCULAR | Status: DC | PRN

## 2019-02-27 NOTE — Progress Notes (Signed)
[name] daughter, [name] updated about positive Covid results.

## 2019-02-27 NOTE — Progress Notes (Signed)
Pt arrived to the ICU/SD from the ED and was able to transfer himself from stretcher to the bed with stand/pivot by self. Pt desatted into the 60s during transfer and was on 4 liters [place]. Attempted to aid oxygenation of pt by increasing 02 on [place] from 4 up to 10 liters [place]. Pt was placed on non-rebreather and sats increased to 86-90%. RT called and placed pt on HFNC at 45liters 100%. Also had CCM-MD on the floor round on pt at this time. Sats are improved to mid 90s currently.   Daughter, [name] was called and given update upon pt's arrival to department. She was given information of care plan including medicaitons ordered, pt's current condition and o2 requirements, pending tests, etc. Questions answered and daughter was informed that she would be updated each day by our staff.

## 2019-02-27 NOTE — ED Notes (Signed)
ED TO INPATIENT HANDOFF REPORT  ED Nurse Name and Phone #: [phone]  S Name/Age/Gender Justin Atkinson. 78 y.o. male Room/Bed: WA08/WA08  Code Status   Code Status: DNR  Home/SNF/Other Home Patient oriented to: self, place, time and situation Is this baseline? Yes   Triage Complete: Triage complete  Chief Complaint shob lung biopsy  Triage Note Patient presented to ed with c/o sob/cough. Patient had lung biopsy done recently and was discharged from hospital.    Allergies No Known Allergies  Level of Care/Admitting Diagnosis ED Disposition    ED Disposition Condition Comment   Admit  Hospital Area: [place] [[id number]]  Level of Care: Stepdown [14]  Admit to SDU based on following criteria: Respiratory Distress:  Frequent assessment and/or intervention to maintain adequate ventilation/respiration, pulmonary toilet, and respiratory treatment.  Covid Evaluation: Symptomatic Person Under Investigation (PUI)  Diagnosis: Acute respiratory failure with hypoxia [facility]) [[id number]]  Admitting Physician: Eugenie Filler [place]  Attending Physician: Eugenie Filler [[id number]]  Estimated length of stay: past midnight tomorrow  Certification:: I certify this patient will need inpatient services for at least 2 midnights  PT Class (Do Not Modify): Inpatient [101]  PT Acc Code (Do Not Modify): Private [1]       B Medical/Surgery History Past Medical History:  Diagnosis Date  . Bladder cancer [facility]) 2001   Surgery  . Complication of anesthesia 2001   woke up during colonoscopy  . COPD (chronic obstructive pulmonary disease) ([place])    "beginning Emphesma"  . DDD (degenerative disc disease), cervical   . Diabetes mellitus without complication ([place])   . DJD (degenerative joint disease)   . Glaucoma   . HTN (hypertension)   . Hypercholesterolemia   . Kidney anomaly, congenital    "born with one kidney"  . Shortness of breath dyspnea    due to overweight  and decreased exercise   Past Surgical History:  Procedure Laterality Date  . BLADDER SURGERY  2001   cystoscopic  . [place]  . COLONOSCOPY W/ POLYPECTOMY    . COLONOSCOPY WITH PROPOFOL N/A 05/04/2015   Procedure: COLONOSCOPY WITH PROPOFOL;  Surgeon: Clarene Essex, MD;  Location: [facility] ENDOSCOPY;  Service: Endoscopy;  Laterality: N/A;  . EYE SURGERY Bilateral    laser for Glaucoma  . HERNIA REPAIR  1960  . INGUINAL HERNIA REPAIR Right 08/01/2012   Procedure: HERNIA REPAIR INGUINAL ADULT;  Surgeon: Earnstine Regal, MD;  Location: [facility] ORS;  Service: General;  Laterality: Right;  . INSERTION OF MESH Right 08/01/2012   Procedure: INSERTION OF MESH;  Surgeon: Earnstine Regal, MD;  Location: [facility] ORS;  Service: General;  Laterality: Right;  . TONSILLECTOMY    . VIDEO ASSISTED THORACOSCOPY (VATS)/WEDGE RESECTION Right 02/09/2019   Procedure: VIDEO ASSISTED THORACOSCOPY (VATS)/WEDGE RESECTION;  Surgeon: Lajuana Matte, MD;  Location: [place];  Service: Thoracic;  Laterality: Right;  Marland Kitchen VIDEO BRONCHOSCOPY N/A 02/09/2019   Procedure: VIDEO BRONCHOSCOPY;  Surgeon: Lajuana Matte, MD;  Location: [place];  Service: Thoracic;  Laterality: N/A;     A IV Location/Drains/Wounds Patient Lines/Drains/Airways Status   Active Line/Drains/Airways    Name:   Placement date:   Placement time:   Site:   Days:   Peripheral IV 08/01/12 Left Hand   08/01/12    0835    Hand   2401   Incision 08/01/12 Abdomen Right   08/01/12    6063     0160  Incision (Closed) 02/09/19 Chest Right   02/09/19    1446     18   Incision - 2 Ports Chest 1: Right;Lateral;Upper 2: Right;Lateral;Mid   02/09/19    1410     18          Intake/Output Last 24 hours No intake or output data in the 24 hours ending 02/23/2019 1709  Labs/Imaging Results for orders placed or performed during the hospital encounter of 02/03/2019 (from the past 48 hour(s))  Blood gas, arterial (at [facility])     Status: Abnormal   Collection Time:  02/14/2019 11:06 AM  Result Value Ref Range   pH, Arterial 7.425 7.350 - 7.450   pCO2 arterial 38.7 32.0 - 48.0 mmHg   pO2, Arterial 57.2 (L) 83.0 - 108.0 mmHg   Bicarbonate 24.9 20.0 - 28.0 mmol/L   Acid-Base Excess 1.2 0.0 - 2.0 mmol/L   O2 Saturation 88.1 %   Patient temperature 98.6    Allens test (pass/fail) PASS PASS    Comment: Performed at [facility], [place] [address] [place], [place] [address]  CBC with Differential     Status: Abnormal   Collection Time: 02/01/2019 11:47 AM  Result Value Ref Range   WBC 8.9 4.0 - 10.5 K/uL   RBC 4.36 4.22 - 5.81 MIL/uL   Hemoglobin 13.4 13.0 - 17.0 g/dL   HCT 41.3 39.0 - 52.0 %   MCV 94.7 80.0 - 100.0 fL   MCH 30.7 26.0 - 34.0 pg   MCHC 32.4 30.0 - 36.0 g/dL   RDW 13.3 11.5 - 15.5 %   Platelets 257 150 - 400 K/uL   nRBC 0.0 0.0 - 0.2 %   Neutrophils Relative % 85 %   Neutro Abs 7.5 1.7 - 7.7 K/uL   Lymphocytes Relative 6 %   Lymphs Abs 0.6 (L) 0.7 - 4.0 K/uL   Monocytes Relative 8 %   Monocytes Absolute 0.7 0.1 - 1.0 K/uL   Eosinophils Relative 0 %   Eosinophils Absolute 0.0 0.0 - 0.5 K/uL   Basophils Relative 0 %   Basophils Absolute 0.0 0.0 - 0.1 K/uL   Immature Granulocytes 1 %   Abs Immature Granulocytes 0.04 0.00 - 0.07 K/uL    Comment: Performed at [facility], [place] [address], [place],  [address]  Comprehensive metabolic panel     Status: Abnormal   Collection Time: 02/23/2019 11:47 AM  Result Value Ref Range   Sodium 136 135 - 145 mmol/L   Potassium 3.9 3.5 - 5.1 mmol/L   Chloride 102 98 - 111 mmol/L   CO2 24 22 - 32 mmol/L   Glucose, Bld 251 (H) 70 - 99 mg/dL   BUN 24 (H) 8 - 23 mg/dL   Creatinine, Ser 0.88 0.61 - 1.24 mg/dL   Calcium 8.5 (L) 8.9 - 10.3 mg/dL   Total Protein 6.7 6.5 - 8.1 g/dL   Albumin 3.3 (L) 3.5 - 5.0 g/dL   AST 40 15 - 41 U/L   ALT 48 (H) 0 - 44 U/L   Alkaline Phosphatase 72 38 - 126 U/L   Total Bilirubin 1.0 0.3 - 1.2 mg/dL   GFR calc non Af Amer >60  >60 mL/min   GFR calc Af Amer >60 >60 mL/min   Anion gap 10 5 - 15    Comment: Performed at [facility], [place] [address] [place], [place] [address]  Lactic acid, plasma  Status: Abnormal   Collection Time: 02/14/2019 11:47 AM  Result Value Ref Range   Lactic Acid, Venous 2.3 (HH) 0.5 - 1.9 mmol/L    Comment: CRITICAL RESULT CALLED TO, READ BACK BY AND VERIFIED WITHMarisa Hua RN AT 1303 02/04/2019 MULLINS,T Performed at [facility], [place] [address], [place], [place] [address]   D-dimer, quantitative     Status: Abnormal   Collection Time: 02/04/2019 11:47 AM  Result Value Ref Range   D-Dimer, Quant 1.32 (H) 0.00 - 0.50 ug/mL-FEU    Comment: (NOTE) At the manufacturer cut-off of 0.50 ug/mL FEU, this assay has been documented to exclude PE with a sensitivity and negative predictive value of 97 to 99%.  At this time, this assay has not been approved by the FDA to exclude DVT/VTE. Results should be correlated with clinical presentation. Performed at [facility], [place] [address] [place], [place] [address]   Procalcitonin     Status: None   Collection Time: 02/19/2019 11:47 AM  Result Value Ref Range   Procalcitonin 0.13 ng/mL    Comment:        Interpretation: PCT (Procalcitonin) <= 0.5 ng/mL: Systemic infection (sepsis) is not likely. Local bacterial infection is possible. (NOTE)       Sepsis PCT Algorithm           Lower Respiratory Tract                                      Infection PCT Algorithm    ----------------------------     ----------------------------         PCT < 0.25 ng/mL                PCT < 0.10 ng/mL         Strongly encourage             Strongly discourage   discontinuation of antibiotics    initiation of antibiotics    ----------------------------     -----------------------------       PCT 0.25 - 0.50 ng/mL            PCT 0.10 - 0.25 ng/mL               OR       >80% decrease in PCT             Discourage initiation of                                            antibiotics      Encourage discontinuation           of antibiotics    ----------------------------     -----------------------------         PCT >= 0.50 ng/mL              PCT 0.26 - 0.50 ng/mL               AND        <80% decrease in PCT             Encourage initiation of  antibiotics       Encourage continuation           of antibiotics    ----------------------------     -----------------------------        PCT >= 0.50 ng/mL                  PCT > 0.50 ng/mL               AND         increase in PCT                  Strongly encourage                                      initiation of antibiotics    Strongly encourage escalation           of antibiotics                                     -----------------------------                                           PCT <= 0.25 ng/mL                                                 OR                                        > 80% decrease in PCT                                     Discontinue / Do not initiate                                             antibiotics Performed at [place] [address] [place], [place] [address]   Lactate dehydrogenase     Status: Abnormal   Collection Time: 03/02/2019 11:47 AM  Result Value Ref Range   LDH 387 (H) 98 - 192 U/L    Comment: Performed at [facility], [place] [address] [place], [place] [address]  Ferritin     Status: Abnormal   Collection Time: 02/03/2019 11:47 AM  Result Value Ref Range   Ferritin 756 (H) 24 - 336 ng/mL    Comment: Performed at [facility], [place] [address] [place], [place] [address]  Triglycerides     Status: None   Collection Time: 02/11/2019 11:47 AM  Result Value Ref Range   Triglycerides 97 <150 mg/dL    Comment: Performed at [facility], [place] [address], [place], [place]  [address]  Fibrinogen     Status: Abnormal   Collection Time: 02/16/2019 11:47 AM  Result Value Ref Range  Fibrinogen 621 (H) 210 - 475 mg/dL    Comment: Performed at [facility], [place] [address], [place], [place] [address]  C-reactive protein     Status: Abnormal   Collection Time: 02/09/2019 11:47 AM  Result Value Ref Range   CRP 11.7 (H) <1.0 mg/dL    Comment: Performed at [facility], [place] [address], [place], [place] [address]  Brain natriuretic peptide     Status: Abnormal   Collection Time: 02/19/2019 11:47 AM  Result Value Ref Range   B Natriuretic Peptide 147.0 (H) 0.0 - 100.0 pg/mL    Comment: Performed at [facility], [place] [address], [place], [place] [address]  POC SARS Coronavirus 2 Ag-ED - Nasal Swab (BD Veritor Kit)     Status: None   Collection Time: 02/22/2019 12:14 PM  Result Value Ref Range   SARS Coronavirus 2 Ag NEGATIVE NEGATIVE    Comment: (NOTE) SARS-CoV-2 antigen PRESENT. Positive results indicate the presence of viral antigens, but clinical correlation with patient history and other diagnostic information is necessary to determine patient infection status.  Positive results do not rule out bacterial infection or co-infection  with other viruses. False positive results are rare but can occur, and confirmatory RT-PCR testing may be appropriate in some circumstances. The expected result is Negative. Fact Sheet for Patients: PodPark.tn Fact Sheet for Providers: GiftContent.is  This test is not yet approved or cleared by the [place] FDA and  has been authorized for detection and/or diagnosis of SARS-CoV-2 by FDA under an Emergency Use Authorization (EUA).  This EUA will remain in effect (meaning this test can be used) for the duration of  the COVID-19 declaration under Section 564(b)(1) of the Act, 21 U.S.C. section 360bbb-3(b)(1), unless the  a uthorization is terminated or revoked sooner.   Lactic acid, plasma     Status: None   Collection Time: 02/10/2019  2:00 PM  Result Value Ref Range   Lactic Acid, Venous 1.4 0.5 - 1.9 mmol/L    Comment: Performed at [facility], [place] [address] [place], [place] [address]   Dg Chest Port 1 View  Result Date: 02/09/2019 CLINICAL DATA:  Cough dyspnea. EXAM: PORTABLE CHEST 1 VIEW COMPARISON:  CT chest 09/22/2018, chest x-ray of February 20, 2019 FINDINGS: Heart size is enlarged as before. Bibasilar opacities have developed since the 02/20/2019 with dense retrocardiac opacification and with more interstitial and subtle alveolar opacities in the right chest. Patient reportedly had a lung biopsy. There is no visible pneumothorax on this semi erect view. Subcutaneous emphysema is noted along the right lateral chest. No acute bone finding. IMPRESSION: 1. Findings that may represent multifocal pneumonia or asymmetric edema. 2. Consolidation or volume loss at the left lung base greater than right. 3. Pulmonary hemorrhage considered on the right given the patient's history of recent right-sided biopsy. 4. No signs of pneumothorax on semi erect view though there is subcutaneous emphysema along the right chest wall. Occult pneumothorax is not excluded. Electronically Signed   By: Zetta Bills M.D.   On: 02/26/2019 11:35    Pending Labs Unresulted Labs (From admission, onward)    Start     Ordered   02/28/19 6808  Basic metabolic panel  Tomorrow morning,   R     02/10/2019 1603   02/28/19 0500  CBC WITH DIFFERENTIAL  Tomorrow morning,   R     03/02/2019 1603   02/21/2019 1605  Hemoglobin A1c  Once,  STAT    Comments: To assess prior glycemic control    02/21/2019 1604   02/24/2019 1604  MRSA PCR Screening  Once,   STAT     02/09/2019 1603   02/07/2019 1601  Respiratory Panel by PCR  (Respiratory virus panel with precautions)  Once,   STAT     02/22/2019 1603   02/08/2019 1601  Legionella  Pneumophila Serogp 1 Ur Ag  Once,   STAT     02/12/2019 1603   02/02/2019 1601  Strep pneumoniae urinary antigen  Once,   STAT     02/05/2019 1603   02/05/2019 1601  Urinalysis, Complete w Microscopic  Once,   STAT     02/01/2019 1603   02/13/2019 1601  Urine culture  Once,   STAT     02/17/2019 1603   02/03/2019 1600  Influenza panel by PCR (type A & B)  (Influenza PCR Panel)  Once,   STAT     02/13/2019 1603   02/03/2019 1558  Culture, sputum-assessment  Once,   R     02/15/2019 1603   02/26/2019 1553  HIV Antibody (routine testing w rflx)  (HIV Antibody (Routine testing w reflex) panel)  Once,   STAT     02/02/2019 1603   02/17/2019 1222  SARS CORONAVIRUS 2 (TAT 6-24 HRS) Nasopharyngeal Nasopharyngeal Swab  (Asymptomatic/Tier 3)  Once,   STAT    Question Answer Comment  Is this test for diagnosis or screening Screening   Symptomatic for COVID-19 as defined by CDC Yes   Date of Symptom Onset 02/15/2019   Hospitalized for COVID-19 Yes   Admitted to ICU for COVID-19 No   Previously tested for COVID-19 Yes   Resident in a congregate (group) care setting No   Employed in healthcare setting No      02/07/2019 1222   02/28/2019 1106  Blood culture (routine x 2)  BLOOD CULTURE X 2,   STAT     02/14/2019 1106          Vitals/Pain Today's Vitals   02/18/2019 1037 02/15/2019 1044 02/19/2019 1046 03/02/2019 1159  BP:   (!) 146/69   Resp:   (!) 28   Temp:   99.4 F (37.4 C) 99.6 F (37.6 C)  TempSrc:   Oral Rectal  SpO2: 93%  90%   PainSc:  0-No pain      Isolation Precautions Airborne and Contact precautions  Medications Medications  vancomycin (VANCOCIN) 2,000 mg in sodium chloride 0.9 % 500 mL IVPB (2,000 mg Intravenous New Bag/Given 03/02/2019 1606)  aspirin EC tablet 81 mg (has no administration in time range)  irbesartan (AVAPRO) tablet 75 mg (has no administration in time range)  rosuvastatin (CRESTOR) tablet 10 mg (has no administration in time range)  predniSONE (DELTASONE) tablet 20 mg (has no  administration in time range)  pantoprazole (PROTONIX) EC tablet 40 mg (has no administration in time range)  omega-3 acid ethyl esters (LOVAZA) capsule 1 g (has no administration in time range)  umeclidinium-vilanterol (ANORO ELLIPTA) 62.5-25 MCG/INH 1 puff (has no administration in time range)  latanoprost (XALATAN) 0.005 % ophthalmic solution 1 drop (has no administration in time range)  timolol (BETIMOL) 0.5 % ophthalmic solution 1 drop (has no administration in time range)  enoxaparin (LOVENOX) injection 40 mg (has no administration in time range)  acetaminophen (TYLENOL) tablet 650 mg (has no administration in time range)  ondansetron (ZOFRAN) injection 4 mg (has no administration in time range)  guaiFENesin (MUCINEX) 12  hr tablet 1,200 mg (has no administration in time range)  fluticasone (FLONASE) 50 MCG/ACT nasal spray 2 spray (has no administration in time range)  loratadine (CLARITIN) tablet 10 mg (has no administration in time range)  insulin aspart (novoLOG) injection 0-20 Units (has no administration in time range)  ceFEPIme (MAXIPIME) 2 g in sodium chloride 0.9 % 100 mL IVPB (has no administration in time range)  vancomycin (VANCOCIN) 1,250 mg in sodium chloride 0.9 % 250 mL IVPB (has no administration in time range)  furosemide (LASIX) injection 40 mg (40 mg Intravenous Given 02/17/2019 1203)  ceFEPIme (MAXIPIME) 2 g in sodium chloride 0.9 % 100 mL IVPB (2 g Intravenous New Bag/Given 02/04/2019 1531)  iohexol (OMNIPAQUE) 350 MG/ML injection 100 mL (100 mLs Intravenous Contrast Given 02/26/2019 1642)    Mobility walks Low fall risk   Focused Assessments Pulmonary Assessment Handoff:  Lung sounds: Bilateral Breath Sounds: Diminished, Clear O2 Device: Nasal Cannula O2 Flow Rate (L/min): 4.5 L/min      R Recommendations: See Admitting Provider Note  Report given to: Marisa Severin  Additional Notes: none

## 2019-02-27 NOTE — Progress Notes (Signed)
CRITICAL VALUE STICKER  CRITICAL VALUE: Covid +   RECEIVER (on-site recipient of call): Orlie Pollen, RN  DATE & TIME NOTIFIED: 02/22/2019 1955  MESSENGER (representative from lab):  MD NOTIFIED: Vicente Males, NP on call for [facility]  TIME OF NOTIFICATION: 1955  RESPONSE: awaiting response

## 2019-02-27 NOTE — Progress Notes (Signed)
A consult was received from an ED physician for Vancomycin per pharmacy dosing.  The patient's profile has been reviewed for ht/wt/allergies/indication/available labs.   A one time order has been placed for Vancomycin 2gm IV x1.  Further antibiotics/pharmacy consults should be ordered by admitting physician if indicated.                       Thank you, Netta Cedars, PharmD 02/19/2019  1:12 PM

## 2019-02-27 NOTE — Telephone Encounter (Signed)
Called service with complaints of worsening chest cold and cough. Reports O2 in mind 70-80s. He recently had lung biopsy on 11/9-11/11. Saw Dr. Vaughan Browner outpatient offive and was started on Prednisone 40mg  for HP. During that visit his oxygen was discontinued because his O2 level maintained above 90% on RA and he did not feel he needed it. Telelvisit on 11/24 reporting side effects from prednisone including elevated BP and insomnia. At the time he described the start of a chest cold, cough np and afebrile. Reported 02 89-95% RA. Advised he'd need an office visit to re-qualify for oxygen. He waned to wait for his follow-up with Dr. Vaughan Browner. Spoke with Dr. Vaughan Browner and prednisone was decreased to 20mg  d/t side effects.   Advised ED eval today for low O2. Sending in doxycycline

## 2019-02-27 NOTE — Progress Notes (Signed)
Pharmacy Antibiotic Note  Justin Atkinson. is a 78 y.o. male admitted on 02/23/2019 with pneumonia.  Pharmacy has been consulted for vancomycin & cefepime dosing.  Plan: Cefepime 2 gm IV q8h vancomycin 2 gm loading dose given in ED at 1606 Vancomycin 1250 mg IV Q 12 hrs. Goal AUC 400-550. Expected AUC: 522.1 SCr used: 0.9 Vd used: 0.5. wt 173 kg, Ht 73 in F/u renal fxn, WBC, temp, culture data Vancomycin levels as needed    Temp (24hrs), Avg:99.5 F (37.5 C), Min:99.4 F (37.4 C), Max:99.6 F (37.6 C)  Recent Labs  Lab 02/24/2019 1147 02/25/2019 1400  WBC 8.9  --   CREATININE 0.88  --   LATICACIDVEN 2.3* 1.4    Estimated Creatinine Clearance: 103.7 mL/min (by C-G formula based on SCr of 0.88 mg/dL).    No Known Allergies  Antimicrobials this admission: 11/27 Vanc>> 11/27 cefepime>> Dose adjustments this admission:  Microbiology results: 11/27 BCx2: sent 11/27 MRSA PCR: 11/27 HIV: 11/27 flu A/B: 11/27 rep panel: 11/27 strep pneumo: 11/27 legionella:  PTA:  11/9 MRSA PCR neg  Thank you for allowing pharmacy to be a part of this patient's care.  Eudelia Bunch, Pharm.D [phone] 02/06/2019 4:41 PM

## 2019-02-27 NOTE — Consult Note (Signed)
NAME:  Justin Atkinson, Justin Atkinson MRN:  [id number], DOB:  05/20/1940, LOS: 0 ADMISSION DATE:  03/02/2019, CONSULTATION DATE:  02/11/2019 REFERRING MD:  Irine Seal, MD CHIEF COMPLAINT:  Acute hypoxemic respiratory failure  Brief History   78 year old male with hx hypersensitivity pneumonitis who presents with acute on chronic hypoxemic respiratory failure  History of present illness   [name]. 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   Of note he is followed in pulmonary clinic with Dr. Vaughan Browner for concerns for ILD on CT.  He underwent VATS wedge resection on 11/9 and diagnosed with hypersensitivity pneumonitis. Started on steroids on 11/18 however due to side effects (insomnia) he was decreased to 20 mg daily on 11/24.  Today he called the pulmonary office to report recent nasal congestion and interval development of cough and shortness of breath in the last week.  He also noticed that his oxygenation worsened with the lowest at 89%.  Denies recent fevers or chills or recent sick contacts.  Though he was in the hospital 3 weeks ago for lung biopsy.  In the ED he was hypoxemic to 80% on 8 L nasal cannula.  HR 70 RR 34 BP elevated to 152/53.  CTA obtained however study was limited due to artifact.  No central filling defects in the pulmonary arteries were noted.  Bilateral diffuse groundglass opacities.  On arrival to the ICU he required increased oxygen support.  Currently on heated high flow 100% with flow 45 L/min.  Past Medical History  Hypersensitivity pneumonitis, emphysema, DM2, HTN, HLD, DJD, hx bladder cacner, congenital solitary kidney  Significant Hospital Events   11/27 admitted  Consults:  PCCM  Procedures:    Significant Diagnostic Tests:  CTA 11/27 interval development of bilateral diffuse groundglass opacities compared to 6/22 CT  chest  Micro Data:  11/27 RVP-pending 11/27 SARS-Covid-2-pending 11/27 blood cultures-pending 11/27 sputum cultures-pending  Antimicrobials:  Vanco 11/27> Cefepime 11/27>  Interim history/subjective:  Tolerating high flow nasal cannula  Objective   Blood pressure (!) 152/53, pulse 66, temperature 97.6 F (36.4 C), temperature source Axillary, resp. rate (!) 22, height '6\' 1"'  (1.854 m), weight 130.7 kg, SpO2 94 %.    FiO2 (%):  [100 %] 100 %  No intake or output data in the 24 hours ending 02/14/2019 1851 Filed Weights   03/01/2019 1810  Weight: 130.7 kg    Physical Exam: General: Well-appearing, no acute distress, wearing heated high flow HENT: [place], AT, OP clear, MMM Eyes: EOMI, no scleral icterus Respiratory: Anterior rhonchi and crackles bilaterally cardiovascular: RRR, -M/R/G, no JVD GI: BS+, soft, nontender Extremities:-Edema,-tenderness Neuro: AAO x4, CNII-XII grossly intact Skin: Intact, no rashes or bruising Psych: Normal mood, normal affect  Resolved Hospital Problem list     Assessment & Plan:   Acute hypoxemic respiratory failure likely secondary to HCAP, ILD flare --Maintain O2 saturations for goal greater than 88% --Wean heated high flow as tolerated --As needed albuterol --Incentive spirometry --Agree with antibiotics.  Follow-up cultures --Continue steroids.  We will wean pending clinical improvement  Hypersensitivity pneumonitis --Management with steroids and oxygen as above  Emphysema --Continue home Anoro  Best practice:  Diet: NPO Pain/Anxiety/Delirium protocol (if indicated): -- VAP protocol (if indicated): -- DVT prophylaxis: Lovenox GI prophylaxis: PPI Glucose control: Per primary Mobility: As tolerated Code Status: DNR Family Communication: Updated patient at bedside 11/27  Disposition: Remain in ICU  Labs   CBC: Recent Labs  Lab 02/03/2019 1147  WBC 8.9  NEUTROABS 7.5  HGB 13.4  HCT 41.3  MCV 94.7  PLT 257    Basic  Metabolic Panel: Recent Labs  Lab 02/12/2019 1147  NA 136  K 3.9  CL 102  CO2 24  GLUCOSE 251*  BUN 24*  CREATININE 0.88  CALCIUM 8.5*   GFR: Estimated Creatinine Clearance: 101.2 mL/min (by C-G formula based on SCr of 0.88 mg/dL). Recent Labs  Lab 02/14/2019 1147 02/18/2019 1400  PROCALCITON 0.13  --   WBC 8.9  --   LATICACIDVEN 2.3* 1.4    Liver Function Tests: Recent Labs  Lab 02/18/2019 1147  AST 40  ALT 48*  ALKPHOS 72  BILITOT 1.0  PROT 6.7  ALBUMIN 3.3*   No results for input(s): LIPASE, AMYLASE in the last 168 hours. No results for input(s): AMMONIA in the last 168 hours.  ABG    Component Value Date/Time   PHART 7.425 02/11/2019 1106   PCO2ART 38.7 02/07/2019 1106   PO2ART 57.2 (L) 02/09/2019 1106   HCO3 24.9 02/02/2019 1106   O2SAT 88.1 02/28/2019 1106     Coagulation Profile: No results for input(s): INR, PROTIME in the last 168 hours.  Cardiac Enzymes: No results for input(s): CKTOTAL, CKMB, CKMBINDEX, TROPONINI in the last 168 hours.  HbA1C: No results found for: HGBA1C  CBG: No results for input(s): GLUCAP in the last 168 hours.  Review of Systems:   Review of Systems  Constitutional: Negative for chills, diaphoresis, fever, malaise/fatigue and weight loss.  HENT: Positive for congestion. Negative for ear pain and sore throat.   Respiratory: Positive for cough, shortness of breath and wheezing. Negative for hemoptysis and sputum production.   Cardiovascular: Negative for chest pain, palpitations and leg swelling.  Gastrointestinal: Negative for abdominal pain, heartburn and nausea.  Genitourinary: Negative for frequency.  Musculoskeletal: Positive for back pain. Negative for joint pain and myalgias.  Skin: Negative for itching and rash.  Neurological: Negative for dizziness, weakness and headaches.  Endo/Heme/Allergies: Does not bruise/bleed easily.  Psychiatric/Behavioral: Negative for depression. The patient is not nervous/anxious.       Past Medical History  He,  has a past medical history of Bladder cancer (August) (1638), Complication of anesthesia (2001), COPD (chronic obstructive pulmonary disease) ([place]), DDD (degenerative disc disease), cervical, Diabetes mellitus without complication ([place]), DJD (degenerative joint disease), Glaucoma, HTN (hypertension), Hypercholesterolemia, Kidney anomaly, congenital, and Shortness of breath dyspnea.   Surgical History    Past Surgical History:  Procedure Laterality Date  . BLADDER SURGERY  2001   cystoscopic  . [place]  . COLONOSCOPY W/ POLYPECTOMY    . COLONOSCOPY WITH PROPOFOL N/A 05/04/2015   Procedure: COLONOSCOPY WITH PROPOFOL;  Surgeon: Clarene Essex, MD;  Location: [facility] ENDOSCOPY;  Service: Endoscopy;  Laterality: N/A;  . EYE SURGERY Bilateral    laser for Glaucoma  . HERNIA REPAIR  1960  . INGUINAL HERNIA REPAIR Right 08/01/2012   Procedure: HERNIA REPAIR INGUINAL ADULT;  Surgeon: Earnstine Regal, MD;  Location: [facility] ORS;  Service: General;  Laterality: Right;  . INSERTION OF MESH Right 08/01/2012   Procedure: INSERTION OF MESH;  Surgeon: Earnstine Regal, MD;  Location: [facility] ORS;  Service: General;  Laterality: Right;  . TONSILLECTOMY    . VIDEO ASSISTED THORACOSCOPY (VATS)/WEDGE RESECTION Right 02/09/2019   Procedure: VIDEO ASSISTED THORACOSCOPY (VATS)/WEDGE RESECTION;  Surgeon: Lajuana Matte, MD;  Location: [facility] OR;  Service: Thoracic;  Laterality: Right;  Marland Kitchen VIDEO BRONCHOSCOPY N/A 02/09/2019   Procedure: VIDEO BRONCHOSCOPY;  Surgeon: Lajuana Matte, MD;  Location: [place];  Service: Thoracic;  Laterality: N/A;     Social History   reports that he quit smoking about 4 years ago. His smoking use included cigarettes and pipe. He has a 10.00 pack-year smoking history. He has never used smokeless tobacco. He reports current alcohol use of about 14.0 standard drinks of alcohol per week. He reports that he does not use drugs.   Family History   His family history  includes Cancer in his father.   Allergies No Known Allergies   Home Medications  Prior to Admission medications   Medication Sig Start Date End Date Taking? Authorizing Provider  acetaminophen (TYLENOL) 500 MG tablet Take 2 tablets (1,000 mg total) by mouth every 6 (six) hours. Patient taking differently: Take 1,000 mg by mouth every 6 (six) hours as needed for mild pain.  02/11/19  Yes Conte, Tessa N, PA-C  ANORO ELLIPTA 62.5-25 MCG/INH AEPB Inhale 1 puff into the lungs daily.  03/02/18  Yes [provider]  aspirin EC 81 MG tablet Take 81 mg by mouth daily.   Yes [provider]  Brinzolamide-Brimonidine (SIMBRINZA) 1-0.2 % SUSP Place 1 drop into the left eye 2 (two) times daily as needed (eye redness).    Yes [provider]  Cholecalciferol (VITAMIN D-3 PO) Take 1,000 Units by mouth daily.   Yes [provider]  irbesartan (AVAPRO) 75 MG tablet Take 1 tablet (75 mg total) by mouth daily. 02/11/19  Yes Conte, Tessa N, PA-C  LUMIGAN 0.01 % SOLN Place 1 drop into both eyes at bedtime.  01/13/18  Yes [provider]  metFORMIN (GLUCOPHAGE-XR) 500 MG 24 hr tablet Take 500 mg by mouth 2 (two) times daily.  03/04/18  Yes [provider]  Multiple Vitamins-Minerals (MULTIVITAMIN PO) Take 1 tablet by mouth daily.    Yes [provider]  Omega-3 Fatty Acids (FISH OIL) 1000 MG CAPS Take 1,000 mg by mouth daily.    Yes [provider]  pantoprazole (PROTONIX) 40 MG tablet Take 40 mg by mouth daily.  03/08/18  Yes [provider]  predniSONE (DELTASONE) 20 MG tablet Take 2 tablets (40 mg total) by mouth daily with breakfast. Patient taking differently: Take 20 mg by mouth daily with breakfast.  02/18/19  Yes Mannam, Praveen, MD  rosuvastatin (CRESTOR) 10 MG tablet Take 10 mg by mouth daily.  04/06/15  Yes [provider]  timolol (BETIMOL) 0.5 % ophthalmic solution Place 1 drop into the left eye 2 (two) times daily.    Yes [provider]  blood glucose meter kit and supplies Dispense based on pt. and insurance preference. Use up to four times daily as directed. (FOR ICD-10 E10.9). 02/18/19   Mannam, Hart Robinsons, MD  doxycycline (VIBRA-TABS) 100 MG tablet Take 1 tablet (100 mg total) by mouth 2 (two) times daily. 02/10/2019   Martyn Ehrich, NP  traMADol (ULTRAM) 50 MG tablet Take 1 tablet (50 mg total) by mouth every 6 (six) hours as needed (mild pain). Patient not taking: Reported on 02/20/2019 02/11/19   Elgie Collard, PA-C     Critical care time: 60 min    The patient is critically ill with multiple organ systems failure and requires high complexity decision making for assessment and support, frequent evaluation and titration of therapies, application of advanced monitoring technologies  and extensive interpretation of multiple databases.   Rodman Pickle, M.D. [facility] Pulmonary/Critical Care Medicine 02/14/2019 7:34 PM   Please see Amion for pager number to reach on-call Pulmonary and Critical Care Team.

## 2019-02-27 NOTE — Progress Notes (Signed)
Pharmacy: LMWH dose adjustment  LMWH 0.5 mg/kg q12 for Covid + pt in ICU with BMI > 35  Eudelia Bunch, Pharm.D [phone] 02/24/2019 8:39 PM

## 2019-02-27 NOTE — H&P (Addendum)
History and Physical    [name]. VOH:[id number] DOB: November 18, 1940 DOA: 02/25/2019  PCP: [name], MD  Patient coming from: Home  I have personally briefly reviewed patient's old medical records in [facility]  Chief Complaint: Shortness of breath/cough.  HPI: [facility]. is a 78 y.o. male with medical history significant of COPD/pulmonary fibrosis was on home O2 however subsequently discontinued over the past few weeks as hypoxia had improved, recent bronchoscopy, right thoracoscopy, wedge resection of right lung February 09 2019, cervical degenerative disc disease, hypertension, diabetes, hyperlipidemia who presents to the ED with a 10-day history of increasing productive cough, shortness of breath, chills.  Patient denies any fevers, no chest pain, no abdominal pain, no nausea or emesis, no diarrhea, no constipation, no melena, no hematemesis, no hematochezia, syncopal episode, no asymmetric weakness or numbness, no weakness or lightheadedness.  Patient stated when EMS came noted to be hypoxic with sats in the 70s.  Patient denies any recent contact with Covid positive patients.  Patient states lives at home with his wife and has been extra careful with wearing masks.  Patient in the ED ON 4.5 - 5 L nasal cannula with O2 sats of 90%.  ED Course: Patient seen in the ED, comprehensive metabolic profile obtained with a glucose of 251, BUN of 24, calcium of 8.5, ALT of 48 otherwise was within normal limits.  BNP of 147.  LDH of 387.  Ferritin of 756, CRP of 11.7, lactic acid of 2.3, procalcitonin of 0.13, D-dimer of 1.32, fibrinogen of 621.  CBC unremarkable.  Point-of-care SARS coronavirus 2 antigen negative.  SARS coronavirus 2 PCR pending.  Chest x-ray obtained with findings and may represent multifocal pneumonia or asymmetric edema.  Consolidation of volume loss at the left lung base greater than right.  Pulmonary hemorrhage considered on the right given patient's history of  recent right-sided biopsy.  No signs of pneumothorax on semierect view though there is subcutaneous emphysema along the right chest wall.  Review of Systems: As per HPI otherwise 10 point review of systems negative.  Past Medical History:  Diagnosis Date   Bladder cancer [facility]) 2001   Surgery   Complication of anesthesia 2001   woke up during colonoscopy   COPD (chronic obstructive pulmonary disease) ([place])    "beginning Emphesma"   DDD (degenerative disc disease), cervical    Diabetes mellitus without complication (HCC)    DJD (degenerative joint disease)    Glaucoma    HTN (hypertension)    Hypercholesterolemia    Kidney anomaly, congenital    "born with one kidney"   Shortness of breath dyspnea    due to overweight and decreased exercise    Past Surgical History:  Procedure Laterality Date   BLADDER SURGERY  2001   cystoscopic   CERVICAL DISC SURGERY  1989   COLONOSCOPY W/ POLYPECTOMY     COLONOSCOPY WITH PROPOFOL N/A 05/04/2015   Procedure: COLONOSCOPY WITH PROPOFOL;  Surgeon: Clarene Essex, MD;  Location: [facility] ENDOSCOPY;  Service: Endoscopy;  Laterality: N/A;   EYE SURGERY Bilateral    laser for Glaucoma   HERNIA REPAIR  1960   INGUINAL HERNIA REPAIR Right 08/01/2012   Procedure: HERNIA REPAIR INGUINAL ADULT;  Surgeon: Earnstine Regal, MD;  Location: [facility] ORS;  Service: General;  Laterality: Right;   INSERTION OF MESH Right 08/01/2012   Procedure: INSERTION OF MESH;  Surgeon: Earnstine Regal, MD;  Location: [facility] ORS;  Service: General;  Laterality: Right;  TONSILLECTOMY     VIDEO ASSISTED THORACOSCOPY (VATS)/WEDGE RESECTION Right 02/09/2019   Procedure: VIDEO ASSISTED THORACOSCOPY (VATS)/WEDGE RESECTION;  Surgeon: Lajuana Matte, MD;  Location: [place];  Service: Thoracic;  Laterality: Right;   VIDEO BRONCHOSCOPY N/A 02/09/2019   Procedure: VIDEO BRONCHOSCOPY;  Surgeon: Lajuana Matte, MD;  Location: [place];  Service: Thoracic;  Laterality: N/A;     reports  that he quit smoking about 4 years ago. His smoking use included cigarettes and pipe. He has a 10.00 pack-year smoking history. He has never used smokeless tobacco. He reports current alcohol use of about 14.0 standard drinks of alcohol per week. He reports that he does not use drugs.  No Known Allergies  Family History  Problem Relation Age of Onset   Cancer Father        lung   Family history reviewed and noncontributory.  Prior to Admission medications   Medication Sig Start Date End Date Taking? Authorizing Provider  acetaminophen (TYLENOL) 500 MG tablet Take 2 tablets (1,000 mg total) by mouth every 6 (six) hours. Patient taking differently: Take 1,000 mg by mouth every 6 (six) hours as needed for mild pain.  02/11/19  Yes Conte, Tessa N, PA-C  ANORO ELLIPTA 62.5-25 MCG/INH AEPB Inhale 1 puff into the lungs daily.  03/02/18  Yes [provider]  aspirin EC 81 MG tablet Take 81 mg by mouth daily.   Yes [provider]  Brinzolamide-Brimonidine (SIMBRINZA) 1-0.2 % SUSP Place 1 drop into the left eye 2 (two) times daily as needed (eye redness).    Yes [provider]  Cholecalciferol (VITAMIN D-3 PO) Take 1,000 Units by mouth daily.   Yes [provider]  irbesartan (AVAPRO) 75 MG tablet Take 1 tablet (75 mg total) by mouth daily. 02/11/19  Yes Conte, Tessa N, PA-C  LUMIGAN 0.01 % SOLN Place 1 drop into both eyes at bedtime.  01/13/18  Yes [provider]  metFORMIN (GLUCOPHAGE-XR) 500 MG 24 hr tablet Take 500 mg by mouth 2 (two) times daily.  03/04/18  Yes [provider]  Multiple Vitamins-Minerals (MULTIVITAMIN PO) Take 1 tablet by mouth daily.    Yes [provider]  Omega-3 Fatty Acids (FISH OIL) 1000 MG CAPS Take 1,000 mg by mouth daily.    Yes [provider]  pantoprazole (PROTONIX) 40 MG tablet Take 40 mg by mouth daily.  03/08/18  Yes [provider]  predniSONE (DELTASONE) 20 MG tablet Take 2 tablets  (40 mg total) by mouth daily with breakfast. Patient taking differently: Take 20 mg by mouth daily with breakfast.  02/18/19  Yes Mannam, Praveen, MD  rosuvastatin (CRESTOR) 10 MG tablet Take 10 mg by mouth daily.  04/06/15  Yes [provider]  timolol (BETIMOL) 0.5 % ophthalmic solution Place 1 drop into the left eye 2 (two) times daily.   Yes [provider]  blood glucose meter kit and supplies Dispense based on pt. and insurance preference. Use up to four times daily as directed. (FOR ICD-10 E10.9). 02/18/19   Mannam, Hart Robinsons, MD  doxycycline (VIBRA-TABS) 100 MG tablet Take 1 tablet (100 mg total) by mouth 2 (two) times daily. 02/26/2019   Martyn Ehrich, NP  traMADol (ULTRAM) 50 MG tablet Take 1 tablet (50 mg total) by mouth every 6 (six) hours as needed (mild pain). Patient not taking: Reported on 02/20/2019 02/11/19   Elgie Collard, [place]    Physical Exam: Vitals:   02/10/2019 1037 02/08/2019 1046  02/20/2019 1159  BP:  (!) 146/69   Resp:  (!) 28   Temp:  99.4 F (37.4 C) 99.6 F (37.6 C)  TempSrc:  Oral Rectal  SpO2: 93% 90%     Constitutional: NAD, calm, comfortable Vitals:   03/01/2019 1037 02/05/2019 1046 02/08/2019 1159  BP:  (!) 146/69   Resp:  (!) 28   Temp:  99.4 F (37.4 C) 99.6 F (37.6 C)  TempSrc:  Oral Rectal  SpO2: 93% 90%    Eyes: PERRL, lids and conjunctivae normal ENMT: Mucous membranes are dry. Posterior pharynx clear of any exudate or lesions.Normal dentition.  Neck: normal, supple, no masses, no thyromegaly Respiratory: Coarse rhonchorous diffuse breath sounds.  Decreased breath sounds in the left base.  No wheezing noted.  Fair air movement.  No use of accessory muscles of respiration.   Cardiovascular: Regular rate and rhythm, no murmurs / rubs / gallops. No extremity edema. 2+ pedal pulses. No carotid bruits.  Abdomen: no tenderness, no masses palpated. No hepatosplenomegaly. Bowel sounds positive.  Musculoskeletal: no clubbing / cyanosis. No  joint deformity upper and lower extremities. Good ROM, no contractures. Normal muscle tone.  Skin: no rashes, lesions, ulcers. No induration Neurologic: CN 2-12 grossly intact. Sensation intact, DTR normal. Strength 5/5 in all 4.  Psychiatric: Normal judgment and insight. Alert and oriented x 3. Normal mood.   Labs on Admission: I have personally reviewed following labs and imaging studies  CBC: Recent Labs  Lab 02/18/2019 1147  WBC 8.9  NEUTROABS 7.5  HGB 13.4  HCT 41.3  MCV 94.7  PLT 812   Basic Metabolic Panel: Recent Labs  Lab 02/24/2019 1147  NA 136  K 3.9  CL 102  CO2 24  GLUCOSE 251*  BUN 24*  CREATININE 0.88  CALCIUM 8.5*   GFR: Estimated Creatinine Clearance: 103.7 mL/min (by C-G formula based on SCr of 0.88 mg/dL). Liver Function Tests: Recent Labs  Lab 02/10/2019 1147  AST 40  ALT 48*  ALKPHOS 72  BILITOT 1.0  PROT 6.7  ALBUMIN 3.3*   No results for input(s): LIPASE, AMYLASE in the last 168 hours. No results for input(s): AMMONIA in the last 168 hours. Coagulation Profile: No results for input(s): INR, PROTIME in the last 168 hours. Cardiac Enzymes: No results for input(s): CKTOTAL, CKMB, CKMBINDEX, TROPONINI in the last 168 hours. BNP (last 3 results) No results for input(s): PROBNP in the last 8760 hours. HbA1C: No results for input(s): HGBA1C in the last 72 hours. CBG: No results for input(s): GLUCAP in the last 168 hours. Lipid Profile: Recent Labs    02/12/2019 1147  TRIG 97   Thyroid Function Tests: No results for input(s): TSH, T4TOTAL, FREET4, T3FREE, THYROIDAB in the last 72 hours. Anemia Panel: Recent Labs    02/23/2019 1147  FERRITIN 756*   Urine analysis:    Component Value Date/Time   COLORURINE YELLOW 02/05/2019 [place] 02/05/2019 1043   LABSPEC 1.015 02/05/2019 1043   PHURINE 5.0 02/05/2019 [place] 02/05/2019 1043   HGBUR NEGATIVE 02/05/2019 [place] 02/05/2019 [place] 02/05/2019 1043   PROTEINUR NEGATIVE 02/05/2019 1043   NITRITE NEGATIVE 02/05/2019 1043   LEUKOCYTESUR TRACE (A) 02/05/2019 1043    Radiological Exams on Admission: Dg Chest Port 1 View  Result Date: 02/20/2019 CLINICAL DATA:  Cough dyspnea. EXAM: PORTABLE CHEST 1 VIEW COMPARISON:  CT chest 09/22/2018, chest x-ray of February 20, 2019 FINDINGS: Heart  size is enlarged as before. Bibasilar opacities have developed since the 02/20/2019 with dense retrocardiac opacification and with more interstitial and subtle alveolar opacities in the right chest. Patient reportedly had a lung biopsy. There is no visible pneumothorax on this semi erect view. Subcutaneous emphysema is noted along the right lateral chest. No acute bone finding. IMPRESSION: 1. Findings that may represent multifocal pneumonia or asymmetric edema. 2. Consolidation or volume loss at the left lung base greater than right. 3. Pulmonary hemorrhage considered on the right given the patient's history of recent right-sided biopsy. 4. No signs of pneumothorax on semi erect view though there is subcutaneous emphysema along the right chest wall. Occult pneumothorax is not excluded. Electronically Signed   By: Zetta Bills M.D.   On: 02/10/2019 11:35    EKG: Independently reviewed.  Normal sinus rhythm with nonspecific ST-T wave changes.  No ischemic changes noted.  Assessment/Plan Principal Problem:   Acute respiratory failure with hypoxia (HCC) Active Problems:   HCAP (healthcare-associated pneumonia)   Hypertension   Hypercholesterolemia   Benign prostate hyperplasia   ILD (interstitial lung disease) ([place])   Postinflammatory pulmonary fibrosis (HCC)   1 acute respiratory failure with hypoxia Questionable etiology.  Patient status post recent bronchoscopy, right thoracoscopy, wedge resection of of right lung 02/09/2019.  Patient was on home O2 recently discontinued per his pulmonologist as O2 sats had improved on room  air.  Patient on prednisone prior to admission.  Concern for possible healthcare associated pneumonia versus atypical pneumonia with concerns for possible Covid infection versus flare of pulmonary fibrosis versus hypersensitivity pneumonitis.  Chest x-ray done concerning for multifocal pneumonia versus atypical pneumonia versus pulmonary edema.  Patient noted to have elevated inflammatory markers with elevated D-dimer, elevated LDH, elevated ferritin, elevated CRP.  Lactic acid level at 2.3.  Procalcitonin 0.13.  We will check a CT angiogram of chest for further evaluation.  Check a sputum Gram stain and culture.  Check a urine Legionella antigen.  Check a urine pneumococcus antigen.  Check an influenza PCR.  Check a respiratory viral panel.  Place empirically on IV vancomycin and IV cefepime, Mucinex, Claritin, Flonase, ELLIPTA.  Solu-Medrol 60 mg IV every 12 hours.  Consult with pulmonary and critical care medicine for further evaluation and management.  2.  Probable healthcare associated pneumonia Patient presented with acute respiratory failure.  See #1.  3.  Hypertension Blood pressure stable.  Continue Avapro.  4.  BPH Stable.  5.  Recent right thoracoscopy, bronchoscopy, wedge resection of right lung November 2020 PCCM consulted.  EDP spoke with cardiothoracic surgery who recommended no further imaging or work-up needed at this time.  Outpatient follow-up.  6.  COPD Patient started on IV steroids secondary to problem #1.  Placed on Ellipta, MDIs as needed, Claritin, Flonase, Mucinex.  DVT prophylaxis: Lovenox Code Status: DNR Family Communication: Updated patient.  No family at bedside. Disposition Plan likely home when clinically improved and when cleared by critical care medicine. Consults called: PCCM Admission status: Admit to stepdown unit/inpatient.   Irine Seal MD [facility]  If 7PM-7AM, please contact night-coverage [email or web address]  02/03/2019, 4:05 PM

## 2019-02-27 NOTE — ED Triage Notes (Signed)
Patient presented to ed with c/o sob/cough. Patient had lung biopsy done recently and was discharged from hospital.

## 2019-02-27 NOTE — ED Notes (Signed)
Lactic acid 2.3 reported from lab. RN Kandice Moos made aware.

## 2019-02-27 NOTE — ED Provider Notes (Addendum)
[facility] DEPT Provider Note   CSN: [id number] Arrival date & time: 02/11/2019  1031     History   Chief Complaint No chief complaint on file.   HPI Dushaun Okey. is a 78 y.o. male.     HPI  78 year old man history of recent bronchoscopy, right thoracoscopy, wedge resection of his right lung in November who presents today with increasing cough and dyspnea over the past 10 days.  Patient reports that he had some lung nodules, pulmonary fibrosis, and COPD.  He states that he was assessed in the hospital for this in early November.  He states that upon discharge on the 12th he noted some nasal tingling like he might be getting a cold.  He states whenever he gets a cold he gets very sick.  He began having some coughing on the 15th.  He states that the coughing has become severe and constant.  He has some mucus with this.  He denies any fever but has had some chills.  He does not think that he has been exposed to Covid at home.  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  They do have extra caregivers that come in daily.  He states these people been with them for an extended period of time and they always mask.  He states that he is very careful and limits any outdoor activity, and wears a mask when he has to go out of the home at all.  Past Medical History:  Diagnosis Date   Bladder cancer [facility]) 2001   Surgery   Complication of anesthesia 2001   woke up during colonoscopy   COPD (chronic obstructive pulmonary disease) ([place])    "beginning Emphesma"   DDD (degenerative disc disease), cervical    Diabetes mellitus without complication (HCC)    DJD (degenerative joint disease)    Glaucoma    HTN (hypertension)    Hypercholesterolemia    Kidney anomaly, congenital    "born with one kidney"   Shortness of breath dyspnea    due to overweight and decreased exercise    Patient Active Problem List   Diagnosis Date  Noted   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   ILD (interstitial lung disease) ([place]) 12/17/2018   Renal cyst 08/07/2017   Infected sebaceous cyst of skin 11/18/2015   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   Inguinal hernia unilateral, non-recurrent 07/23/2012   Benign prostate hyperplasia 09/17/2011   Chest pain, atypical 09/03/2011   Tobacco abuse 09/03/2011   Hypertension 09/03/2011   Hypercholesterolemia 09/03/2011    Past Surgical History:  Procedure Laterality Date   BLADDER SURGERY  2001   cystoscopic   CERVICAL DISC SURGERY  1989   COLONOSCOPY W/ POLYPECTOMY     COLONOSCOPY WITH PROPOFOL N/A 05/04/2015   Procedure: COLONOSCOPY WITH PROPOFOL;  Surgeon: Clarene Essex, MD;  Location: [facility] ENDOSCOPY;  Service: Endoscopy;  Laterality: N/A;   EYE SURGERY Bilateral    laser for Glaucoma   HERNIA REPAIR  1960   INGUINAL HERNIA REPAIR Right 08/01/2012   Procedure: HERNIA REPAIR INGUINAL ADULT;  Surgeon: Earnstine Regal, MD;  Location: [facility];  Service: General;  Laterality: Right;   INSERTION OF MESH Right 08/01/2012   Procedure: INSERTION OF MESH;  Surgeon: Earnstine Regal, MD;  Location: [facility];  Service: General;  Laterality: Right;   TONSILLECTOMY     VIDEO ASSISTED THORACOSCOPY (VATS)/WEDGE  RESECTION Right 02/09/2019   Procedure: VIDEO ASSISTED THORACOSCOPY (VATS)/WEDGE RESECTION;  Surgeon: Lajuana Matte, MD;  Location: [facility];  Service: Thoracic;  Laterality: Right;   VIDEO BRONCHOSCOPY N/A 02/09/2019   Procedure: VIDEO BRONCHOSCOPY;  Surgeon: Lajuana Matte, MD;  Location: [facility] OR;  Service: Thoracic;  Laterality: N/A;        Home Medications    Prior to Admission medications   Medication Sig Start Date End Date Taking? Authorizing Provider  acetaminophen (TYLENOL) 500 MG tablet Take 2 tablets (1,000 mg total) by mouth every 6 (six) hours. Patient taking differently: Take 1,000 mg by mouth every 6 (six)  hours as needed for mild pain.  02/11/19  Yes Conte, Tessa N, PA-C  ANORO ELLIPTA 62.5-25 MCG/INH AEPB Inhale 1 puff into the lungs daily.  03/02/18  Yes [provider]  aspirin EC 81 MG tablet Take 81 mg by mouth daily.   Yes [provider]  Brinzolamide-Brimonidine (SIMBRINZA) 1-0.2 % SUSP Place 1 drop into the left eye 2 (two) times daily as needed (eye redness).    Yes [provider]  Cholecalciferol (VITAMIN D-3 PO) Take 1,000 Units by mouth daily.   Yes [provider]  irbesartan (AVAPRO) 75 MG tablet Take 1 tablet (75 mg total) by mouth daily. 02/11/19  Yes Conte, Tessa N, PA-C  LUMIGAN 0.01 % SOLN Place 1 drop into both eyes at bedtime.  01/13/18  Yes [provider]  metFORMIN (GLUCOPHAGE-XR) 500 MG 24 hr tablet Take 500 mg by mouth 2 (two) times daily.  03/04/18  Yes [provider]  Multiple Vitamins-Minerals (MULTIVITAMIN PO) Take 1 tablet by mouth daily.    Yes [provider]  Omega-3 Fatty Acids (FISH OIL) 1000 MG CAPS Take 1,000 mg by mouth daily.    Yes [provider]  pantoprazole (PROTONIX) 40 MG tablet Take 40 mg by mouth daily.  03/08/18  Yes [provider]  predniSONE (DELTASONE) 20 MG tablet Take 2 tablets (40 mg total) by mouth daily with breakfast. Patient taking differently: Take 20 mg by mouth daily with breakfast.  02/18/19  Yes Mannam, Praveen, MD  rosuvastatin (CRESTOR) 10 MG tablet Take 10 mg by mouth daily.  04/06/15  Yes [provider]  timolol (BETIMOL) 0.5 % ophthalmic solution Place 1 drop into the left eye 2 (two) times daily.   Yes [provider]  blood glucose meter kit and supplies Dispense based on pt. and insurance preference. Use up to four times daily as directed. (FOR ICD-10 E10.9). 02/18/19   Mannam, Hart Robinsons, MD  doxycycline (VIBRA-TABS) 100 MG tablet Take 1 tablet (100 mg total) by mouth 2 (two) times daily. 02/09/2019   Martyn Ehrich, NP  traMADol  (ULTRAM) 50 MG tablet Take 1 tablet (50 mg total) by mouth every 6 (six) hours as needed (mild pain). Patient not taking: Reported on 02/20/2019 02/11/19   Elgie Collard, PA-C    Family History Family History  Problem Relation Age of Onset   Cancer Father        lung    Social History Social History   Tobacco Use   Smoking status: Former Smoker    Packs/day: 0.25    Years: 40.00    Pack years: 10.00    Types: Cigarettes, Pipe    Quit date: 12/02/2014    Years since quitting: 4.2   Smokeless tobacco: Never Used  Substance Use Topics   Alcohol use: Yes    Alcohol/week: 14.0  standard drinks    Types: 14 Shots of liquor per week    Comment: Scotch and water- 2 a day - somedays less   Drug use: No     Allergies   Patient has no known allergies.   Review of Systems Review of Systems   Physical Exam Updated Vital Signs BP (!) 146/69 (BP Location: Left Arm)    Temp 99.4 F (37.4 C) (Oral)    Resp (!) 28    SpO2 90%   Physical Exam Vitals and nursing note reviewed.  Constitutional:      General: He is not in acute distress.    Appearance: Normal appearance.  HENT:     Head: Normocephalic.     Right Ear: External ear normal.     Left Ear: External ear normal.     Nose: Nose normal.     Mouth/Throat:     Mouth: Mucous membranes are moist.  Eyes:     Pupils: Pupils are equal, round, and reactive to light.  Cardiovascular:     Rate and Rhythm: Normal rate and regular rhythm.     Pulses: Normal pulses.     Heart sounds: Normal heart sounds.  Pulmonary:     Effort: Respiratory distress present.     Comments: Decreased bs throughout faint expiratory wheezes noted Abdominal:     General: Abdomen is flat.     Palpations: Abdomen is soft.  Musculoskeletal:        General: Normal range of motion.     Cervical back: Normal range of motion.  Skin:    General: Skin is warm and dry.     Capillary Refill: Capillary refill takes less than 2 seconds.  Neurological:      General: No focal deficit present.     Mental Status: He is alert and oriented to person, place, and time.  Psychiatric:        Mood and Affect: Mood normal.      ED Treatments / Results  Labs (all labs ordered are listed, but only abnormal results are displayed) Labs Reviewed  CULTURE, BLOOD (ROUTINE X 2)  CULTURE, BLOOD (ROUTINE X 2)  CBC WITH DIFFERENTIAL/PLATELET  COMPREHENSIVE METABOLIC PANEL  BLOOD GAS, ARTERIAL  LACTIC ACID, PLASMA  LACTIC ACID, PLASMA  D-DIMER, QUANTITATIVE (NOT AT [facility])  PROCALCITONIN  LACTATE DEHYDROGENASE  FERRITIN  TRIGLYCERIDES  FIBRINOGEN  C-REACTIVE PROTEIN  POC SARS CORONAVIRUS 2 AG -  ED    EKG EKG Interpretation  Date/Time:  Friday February 27 2019 10:43:15 EST Ventricular Rate:  75 PR Interval:    QRS Duration: 100 QT Interval:  386 QTC Calculation: 432 R Axis:   -53 Text Interpretation: Sinus or ectopic atrial rhythm LAD, consider left anterior fascicular block Abnormal R-wave progression, late transition Borderline T abnormalities, anterior leads Confirmed by Pattricia Boss [phone]) on 02/09/2019 11:43:55 AM   Radiology Dg Chest Port 1 View  Result Date: 03/02/2019 CLINICAL DATA:  Cough dyspnea. EXAM: PORTABLE CHEST 1 VIEW COMPARISON:  CT chest 09/22/2018, chest x-Henri Baumler of February 20, 2019 FINDINGS: Heart size is enlarged as before. Bibasilar opacities have developed since the 02/20/2019 with dense retrocardiac opacification and with more interstitial and subtle alveolar opacities in the right chest. Patient reportedly had a lung biopsy. There is no visible pneumothorax on this semi erect view. Subcutaneous emphysema is noted along the right lateral chest. No acute bone finding. IMPRESSION: 1. Findings that may represent multifocal pneumonia or asymmetric edema. 2. Consolidation or volume loss at the  left lung base greater than right. 3. Pulmonary hemorrhage considered on the right given the patient's history of recent right-sided  biopsy. 4. No signs of pneumothorax on semi erect view though there is subcutaneous emphysema along the right chest wall. Occult pneumothorax is not excluded. Electronically Signed   By: Zetta Bills M.D.   On: 02/14/2019 11:35    Procedures .Critical Care Performed by: Pattricia Boss, MD Authorized by: Pattricia Boss, MD   Critical care provider statement:    Critical care time (minutes):  45   Critical care end time:  02/28/2019 4:12 PM   Critical care was necessary to treat or prevent imminent or life-threatening deterioration of the following conditions:  Respiratory failure   Critical care was time spent personally by me on the following activities:  Discussions with consultants, evaluation of patient's response to treatment, examination of patient, ordering and performing treatments and interventions, ordering and review of laboratory studies, ordering and review of radiographic studies, pulse oximetry, re-evaluation of patient's condition, obtaining history from patient or surrogate and review of old charts   (including critical care time)  Medications Ordered in ED Medications - No data to display   Initial Impression / Assessment and Plan / ED Course  I have reviewed the triage vital signs and the nursing notes.  Pertinent labs & imaging results that were available during my care of the patient were reviewed by me and considered in my medical decision making (see chart for details).  Clinical Course as of Feb 26 1306  Fri Feb 27, 2019  1145 Reviewed- infectious vs asymmetric pulmonary edema.  Lasix ordered.  Will hold abx pending rapid covid   [DR]    Clinical Course User Index [DR] Pattricia Boss, MD   [name]. was evaluated in Emergency Department on 02/28/2019 for the symptoms described in the history of present illness. He was evaluated in the context of the global COVID-19 pandemic, which necessitated consideration that the patient might be at risk for  infection with the SARS-CoV-2 virus that causes COVID-19. Institutional protocols and algorithms that pertain to the evaluation of patients at risk for COVID-19 are in a state of rapid change based on information released by regulatory bodies including the CDC and federal and state organizations. These policies and algorithms were followed during the patient's care in the ED.    Discussed with pulmonary and advise cover for hcap CT surgery paged Discussed with Dr. Kipp Brood- does not advise any further need for bleeding evaluation secondary to surgery Dyspnea- patient with recent vats and biopsy presents today with dyspnea.  Cxr with multifocal pneumonia vs asymmetric edema.  HO pulmonary fibrosis. Patient covered with abx for hcap. Covid poc negative , pcr test pending. Lasix given Plan admission for further evaluation with new onset dyspnea and significant oxygen requirement Late entry for PE Final Clinical Impressions(s) / ED Diagnoses   Final diagnoses:  Acute respiratory failure with hypoxia ([place])  Multilobar lung infiltrate    ED Discharge Orders    None       Pattricia Boss, MD 02/28/19 1609    Pattricia Boss, MD 02/28/19 1613    Pattricia Boss, MD 03/16/19 1452

## 2019-02-28 DIAGNOSIS — J849 Interstitial pulmonary disease, unspecified: Secondary | ICD-10-CM | POA: Diagnosis not present

## 2019-02-28 DIAGNOSIS — J069 Acute upper respiratory infection, unspecified: Secondary | ICD-10-CM

## 2019-02-28 DIAGNOSIS — J9601 Acute respiratory failure with hypoxia: Secondary | ICD-10-CM | POA: Diagnosis not present

## 2019-02-28 LAB — EXPECTORATED SPUTUM ASSESSMENT W GRAM STAIN, RFLX TO RESP C

## 2019-02-28 LAB — BASIC METABOLIC PANEL
Anion gap: 10 (ref 5–15)
BUN: 22 mg/dL (ref 8–23)
CO2: 23 mmol/L (ref 22–32)
Calcium: 8.4 mg/dL — ABNORMAL LOW (ref 8.9–10.3)
Chloride: 104 mmol/L (ref 98–111)
Creatinine, Ser: 0.89 mg/dL (ref 0.61–1.24)
GFR calc Af Amer: >60 mL/min (ref >60)
GFR calc non Af Amer: >60 mL/min (ref >60)
Glucose, Bld: 245 mg/dL — ABNORMAL HIGH (ref 70–99)
Potassium: 4.2 mmol/L (ref 3.5–5.1)
Sodium: 137 mmol/L (ref 135–145)

## 2019-02-28 LAB — CBC WITH DIFFERENTIAL/PLATELET
Abs Immature Granulocytes: 0.07 10*3/uL (ref 0.00–0.07)
Basophils Absolute: 0 10*3/uL (ref 0.0–0.1)
Basophils Relative: 0 %
Eosinophils Absolute: 0 10*3/uL (ref 0.0–0.5)
Eosinophils Relative: 0 %
HCT: 42.6 % (ref 39.0–52.0)
Hemoglobin: 13.6 g/dL (ref 13.0–17.0)
Immature Granulocytes: 1 %
Lymphocytes Relative: 5 %
Lymphs Abs: 0.4 10*3/uL — ABNORMAL LOW (ref 0.7–4.0)
MCH: 30.8 pg (ref 26.0–34.0)
MCHC: 31.9 g/dL (ref 30.0–36.0)
MCV: 96.4 fL (ref 80.0–100.0)
Monocytes Absolute: 0.3 10*3/uL (ref 0.1–1.0)
Monocytes Relative: 4 %
Neutro Abs: 7.5 10*3/uL (ref 1.7–7.7)
Neutrophils Relative %: 90 %
Platelets: 272 10*3/uL (ref 150–400)
RBC: 4.42 MIL/uL (ref 4.22–5.81)
RDW: 13.3 % (ref 11.5–15.5)
WBC: 8.3 10*3/uL (ref 4.0–10.5)
nRBC: 0 % (ref 0.0–0.2)

## 2019-02-28 LAB — STREP PNEUMONIAE URINARY ANTIGEN: Strep Pneumo Urinary Antigen: NEGATIVE

## 2019-02-28 LAB — C-REACTIVE PROTEIN: CRP: 15.5 mg/dL — ABNORMAL HIGH (ref <1.0)

## 2019-02-28 LAB — HEMOGLOBIN A1C
Hgb A1c MFr Bld: 7 % — ABNORMAL HIGH (ref 4.8–5.6)
Mean Plasma Glucose: 154.2 mg/dL

## 2019-02-28 LAB — GLUCOSE, CAPILLARY
Glucose-Capillary: 241 mg/dL — ABNORMAL HIGH (ref 70–99)
Glucose-Capillary: 217 mg/dL — ABNORMAL HIGH (ref 70–99)

## 2019-02-28 LAB — FIBRINOGEN: Fibrinogen: 653 mg/dL — ABNORMAL HIGH (ref 210–475)

## 2019-02-28 LAB — FERRITIN: Ferritin: 763 ng/mL — ABNORMAL HIGH (ref 24–336)

## 2019-02-28 LAB — PROCALCITONIN: Procalcitonin: <0.1 ng/mL

## 2019-02-28 LAB — HIV ANTIBODY (ROUTINE TESTING W REFLEX): HIV Screen 4th Generation wRfx: NONREACTIVE

## 2019-02-28 LAB — D-DIMER, QUANTITATIVE: D-Dimer, Quant: 1.02 ug/mL-FEU — ABNORMAL HIGH (ref 0.00–0.50)

## 2019-02-28 MED ORDER — ORAL CARE MOUTH RINSE
15.0000 mL | Freq: Two times a day (BID) | OROMUCOSAL | Status: DC
  Administered 2019-02-28 – 2019-04-06 (×69): 15 mL via OROMUCOSAL

## 2019-02-28 MED ORDER — SODIUM CHLORIDE 0.9 % IV SOLN
200.0000 mg | Freq: Once | INTRAVENOUS | Status: AC
  Administered 2019-02-28: 200 mg via INTRAVENOUS
  Filled 2019-02-28: qty 40

## 2019-02-28 MED ORDER — ENOXAPARIN SODIUM 60 MG/0.6ML ~~LOC~~ SOLN
60.0000 mg | Freq: Two times a day (BID) | SUBCUTANEOUS | Status: DC
  Administered 2019-02-28 – 2019-03-06 (×13): 60 mg via SUBCUTANEOUS
  Filled 2019-02-28 (×14): qty 0.6

## 2019-02-28 MED ORDER — CHLORHEXIDINE GLUCONATE CLOTH 2 % EX PADS
6.0000 | MEDICATED_PAD | Freq: Every day | CUTANEOUS | Status: DC
  Administered 2019-02-28 – 2019-03-19 (×17): 6 via TOPICAL

## 2019-02-28 MED ORDER — SODIUM CHLORIDE 0.9 % IV SOLN
100.0000 mg | INTRAVENOUS | Status: AC
  Administered 2019-03-01 – 2019-03-04 (×4): 100 mg via INTRAVENOUS
  Filled 2019-02-28: qty 100
  Filled 2019-02-28 (×2): qty 20
  Filled 2019-02-28 (×2): qty 100

## 2019-02-28 NOTE — Progress Notes (Addendum)
PROGRESS NOTE                                                                                                                                                                                                             Patient Demographics:    Justin Atkinson, is a 78 y.o. 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 Vaughan Browner) who was recently started on steroid on 11/18, hypertension, diabetes mellitus type 2, hyperlipidemia and DJD presented with increasing shortness of breath with nasal congestion cough for about 10 days.  Also noticed that his oxygen dropped to 89% on room air.  Called the pulmonary office who asked him to come to the ED. In the ED he is hypoxic at 80%, tachypneic and mildly hypertensive.  CT angiogram of the chest was negative for PE and showed bilateral diffuse groundglass opacity. Admitted to ICU on high flow started on IV Solu-Medrol.  COVID-19 test returned as positive.  In the ED he had elevated inflammatory markers including LDH, ferritin, CRP, lactic acid and D-dimer.    Subjective:   Patient hypoxia early morning and required nonrebreather.  Discussed about going to [facility], hesitant initially but finally agreed.   Assessment  & Plan :    Principal Problem:   Acute respiratory failure with hypoxia ([place]) Secondary to COVID-19 infection worsening his underlying chronic lung disease. Currently on NRB, transitioned to high flow nasal cannula.  Pulmonary following and appreciate recommendations.  Patient is at high risk for respiratory decompensation.   Discussed CODE STATUS again and confirms being DNR. Continue IV Solu-Medrol.  Started remdesivir today. Trend inflammatory markers.  Pulmonary hygiene.  Transfer to [facility] for further care.  Needs ICU level of care.  Severe sepsis Secondary to COVID-19 infection.  On empiric antibiotic for healthcare associated pneumonia.  Follow blood cultures.  Hypersensitivity pneumonitis On IV steroid.  Pulmonary recommends weaning to baseline oral prednisone of 20 mg after current course completed.  COPD with emphysema Continue inhaler and  nebs.  Essential hypertension Stable.  Continue Avapro         Code Status : DNR  Family Communication  : We will update wife on the phone  Disposition Plan  : Transfer to [place]  Barriers For Discharge : Active symptoms  Consults  : Pulmonary  Procedures  : CT angiogram of the chest  DVT Prophylaxis  :  Lovenox -  Lab Results  Component Value Date   PLT 272 02/28/2019    Antibiotics  :   Anti-infectives (From admission, onward)   Start     Dose/Rate Route Frequency Ordered Stop   03/01/19 0800  remdesivir 100 mg in sodium chloride 0.9 % 250 mL IVPB     100 mg 500 mL/hr over 30 Minutes Intravenous Every 24 hours 02/28/19 0823 03/05/19 0759   02/28/19 1000  remdesivir 200 mg in sodium chloride 0.9 % 250 mL IVPB     200 mg 500 mL/hr over 30 Minutes Intravenous Once 02/28/19 0823 02/28/19 1006   02/28/19 0000  vancomycin (VANCOCIN) 1,250 mg in sodium chloride 0.9 % 250 mL IVPB  Status:  Discontinued     1,250 mg 166.7 mL/hr over 90 Minutes Intravenous Every 12 hours 02/16/2019 1639 02/14/2019 2037   02/08/2019 2200  ceFEPIme (MAXIPIME) 2 g in sodium chloride 0.9 % 100 mL IVPB     2 g 200 mL/hr over 30 Minutes Intravenous Every 8 hours 02/16/2019 1629     02/23/2019 1315  ceFEPIme (MAXIPIME) 2 g in sodium chloride 0.9 % 100 mL IVPB     2 g 200 mL/hr over 30 Minutes Intravenous  Once 02/12/2019 1306 02/28/19 0324    03/02/2019 1315  vancomycin (VANCOCIN) 2,000 mg in sodium chloride 0.9 % 500 mL IVPB     2,000 mg 250 mL/hr over 120 Minutes Intravenous  Once 02/26/2019 1311 02/28/19 0324        Objective:   Vitals:   02/28/19 0400 02/28/19 0812 02/28/19 0813 02/28/19 0837  BP: (!) 126/94 127/63    Pulse: 79  63   Resp: (!) 30 (!) 22 (!) 23 (!) 24  Temp:  98.2 F (36.8 C)    TempSrc:  Axillary    SpO2: 92%  92% 92%  Weight:  130.7 kg    Height:  '6\' 1"'  (1.854 m)      Wt Readings from Last 3 Encounters:  02/28/19 130.7 kg  02/20/19 (!) 137 kg  02/18/19 (!) 137 kg     Intake/Output Summary (Last 24 hours) at 02/28/2019 1056 Last data filed at 02/28/2019 1015 Gross per 24 hour  Intake 527.16 ml  Output 1000 ml  Net -472.84 ml     Physical Exam  Gen: On NRB, not in respiratory distress HEENT: moist mucosa, supple neck Chest: Coarse breath sounds bilaterally CVS: N S1&S2, no murmurs GI: soft, NT, ND,  Musculoskeletal: warm, no edema     Data Review:    CBC Recent Labs  Lab 02/19/2019 1147 02/28/19 0229  WBC 8.9 8.3  HGB 13.4 13.6  HCT 41.3 42.6  PLT 257 272  MCV 94.7 96.4  MCH 30.7 30.8  MCHC 32.4 31.9  RDW 13.3 13.3  LYMPHSABS 0.6* 0.4*  MONOABS 0.7 0.3  EOSABS 0.0 0.0  BASOSABS 0.0 0.0    Chemistries  Recent Labs  Lab 02/22/2019 1147 02/28/19 0229  NA 136 137  K 3.9 4.2  CL 102 104  CO2 24 23  GLUCOSE 251* 245*  BUN 24*  22  CREATININE 0.88 0.89  CALCIUM 8.5* 8.4*  AST 40  --   ALT 48*  --   ALKPHOS 72  --   BILITOT 1.0  --    ------------------------------------------------------------------------------------------------------------------ Recent Labs    02/10/2019 1147  TRIG 97    Lab Results  Component Value Date   HGBA1C 7.0 (H) 02/28/2019   ------------------------------------------------------------------------------------------------------------------ No results for input(s): TSH, T4TOTAL, T3FREE, THYROIDAB in the last 72 hours.  Invalid  input(s): FREET3 ------------------------------------------------------------------------------------------------------------------ Recent Labs    03/02/2019 1147 02/28/19 0757  FERRITIN 756* 763*    Coagulation profile No results for input(s): INR, PROTIME in the last 168 hours.  Recent Labs    02/10/2019 1147 02/28/19 0757  DDIMER 1.32* 1.02*    Cardiac Enzymes No results for input(s): CKMB, TROPONINI, MYOGLOBIN in the last 168 hours.  Invalid input(s): CK ------------------------------------------------------------------------------------------------------------------    Component Value Date/Time   BNP 147.0 (H) 02/20/2019 1147    Inpatient Medications  Scheduled Meds:  aspirin EC  81 mg Oral Daily   Chlorhexidine Gluconate Cloth  6 each Topical Daily   enoxaparin (LOVENOX) injection  60 mg Subcutaneous BID   fluticasone  2 spray Each Nare Daily   guaiFENesin  1,200 mg Oral BID   insulin aspart  0-20 Units Subcutaneous TID WC   irbesartan  75 mg Oral Daily   latanoprost  1 drop Both Eyes QHS   loratadine  10 mg Oral Daily   mouth rinse  15 mL Mouth Rinse BID   methylPREDNISolone (SOLU-MEDROL) injection  60 mg Intravenous Q12H   omega-3 acid ethyl esters  1 g Oral Daily   pantoprazole  40 mg Oral Daily   rosuvastatin  10 mg Oral Daily   timolol  1 drop Left Eye BID   umeclidinium-vilanterol  1 puff Inhalation Daily   Continuous Infusions:  ceFEPime (MAXIPIME) IV Stopped (02/28/19 0531)   [START ON 03/01/2019] remdesivir 100 mg in NS 250 mL     PRN Meds:.acetaminophen, albuterol, ondansetron (ZOFRAN) IV  Micro Results Recent Results (from the past 240 hour(s))  SARS CORONAVIRUS 2 (TAT 6-24 HRS) Nasopharyngeal Nasopharyngeal Swab     Status: Abnormal   Collection Time: 02/11/2019  2:00 PM   Specimen: Nasopharyngeal Swab  Result Value Ref Range Status   SARS Coronavirus 2 POSITIVE (A) NEGATIVE Final    Comment: RESULT CALLED TO, READ BACK BY  AND VERIFIED WITH: K.GARCIA RN 1950 03/01/2019 MCCORMICK K (NOTE) SARS-CoV-2 target nucleic acids are DETECTED. The SARS-CoV-2 RNA is generally detectable in upper and lower respiratory specimens during the acute phase of infection. Positive results are indicative of the presence of SARS-CoV-2 RNA. Clinical correlation with patient history and other diagnostic information is  necessary to determine patient infection status. Positive results do not rule out bacterial infection or co-infection with other viruses.  The expected result is Negative. Fact Sheet for Patients: SugarRoll.be Fact Sheet for Healthcare Providers: https://www.woods-mathews.com/ This test is not yet approved or cleared by the [place] FDA and  has been authorized for detection and/or diagnosis of SARS-CoV-2 by FDA under an Emergency Use Authorization (EUA). This EUA will remain  in effect (meaning this test can be used) for  the duration of the COVID-19 declaration under Section 564(b)(1) of the Act, 21 U.S.C. section 360bbb-3(b)(1), unless the authorization is terminated or revoked sooner. Performed at [facility], [place] [address] [place], [place] [address]   Respiratory Panel by PCR     Status: None  Collection Time: 02/22/2019  6:45 PM   Specimen: Flu Kit Nasopharyngeal Swab; Respiratory  Result Value Ref Range Status   Adenovirus NOT DETECTED NOT DETECTED Final   Coronavirus 229E NOT DETECTED NOT DETECTED Final    Comment: (NOTE) The Coronavirus on the Respiratory Panel, DOES NOT test for the novel  Coronavirus (2019 nCoV)    Coronavirus HKU1 NOT DETECTED NOT DETECTED Final   Coronavirus NL63 NOT DETECTED NOT DETECTED Final   Coronavirus OC43 NOT DETECTED NOT DETECTED Final   Metapneumovirus NOT DETECTED NOT DETECTED Final   Rhinovirus / Enterovirus NOT DETECTED NOT DETECTED Final   Influenza A NOT DETECTED NOT DETECTED Final   Influenza B NOT  DETECTED NOT DETECTED Final   Parainfluenza Virus 1 NOT DETECTED NOT DETECTED Final   Parainfluenza Virus 2 NOT DETECTED NOT DETECTED Final   Parainfluenza Virus 3 NOT DETECTED NOT DETECTED Final   Parainfluenza Virus 4 NOT DETECTED NOT DETECTED Final   Respiratory Syncytial Virus NOT DETECTED NOT DETECTED Final   Bordetella pertussis NOT DETECTED NOT DETECTED Final   Chlamydophila pneumoniae NOT DETECTED NOT DETECTED Final   Mycoplasma pneumoniae NOT DETECTED NOT DETECTED Final    Comment: Performed at [facility], [place]. [address], [place], [place] [address]  MRSA PCR Screening     Status: None   Collection Time: 02/06/2019  6:45 PM   Specimen: Nasal Mucosa; Nasopharyngeal  Result Value Ref Range Status   MRSA by PCR NEGATIVE NEGATIVE Final    Comment:        The GeneXpert MRSA Assay (FDA approved for NASAL specimens only), is one component of a comprehensive MRSA colonization surveillance program. It is not intended to diagnose MRSA infection nor to guide or monitor treatment for MRSA infections. Performed at [facility], [place] [address] [place], [place] [address]     Radiology Reports Dg Chest 2 View  Result Date: 02/20/2019 CLINICAL DATA:  Interstitial lung disease, COPD, diabetes mellitus, hypertension EXAM: CHEST - 2 VIEW COMPARISON:  02/11/2019, 02/09/2019 FINDINGS: Enlargement of cardiac silhouette. Mediastinal contours and pulmonary vascularity normal. Mild accentuation of interstitial markings in the periphery of both lungs. No acute infiltrate, pleural effusion or pneumothorax. Osseous structures unremarkable. IMPRESSION: Peripheral interstitial prominence in the lungs bilaterally, slightly greater at bases. No definite acute infiltrate. Mild enlargement of cardiac silhouette. Electronically Signed   By: Lavonia Dana M.D.   On: 02/20/2019 13:08   Dg Chest 2 View  Result Date: 02/09/2019 CLINICAL DATA:  Pre right lung biopsy evaluation. Pulmonary  fibrosis. Ex-smoker. EXAM: CHEST - 2 VIEW COMPARISON:  Chest CT dated 09/22/2018. Chest radiographs dated 07/25/2012 FINDINGS: The cardiac silhouette is borderline enlarged. The aorta is tortuous. Stable minimal linear scarring at the left lung base with interval minimal linear scarring in the left mid lung zone. Mild diffuse peribronchial thickening with improvement. Thoracic and upper lumbar spine degenerative changes. IMPRESSION: 1. No acute abnormality. 2. Mild chronic bronchitic changes. Electronically Signed   By: Claudie Revering M.D.   On: 02/09/2019 09:32   Ct Angio Chest Pe W And/or Wo Contrast  Result Date: 02/08/2019 CLINICAL DATA:  PE suspected, intermediate prob, neg D-dimer. Cough and shortness of breath. Recent VATS lung biopsy 02/09/2019. EXAM: CT ANGIOGRAPHY CHEST WITH CONTRAST TECHNIQUE: Multidetector CT imaging of the chest was performed using the standard protocol during bolus administration of intravenous contrast. Multiplanar CT image reconstructions and MIPs were obtained to evaluate the vascular anatomy. CONTRAST:  143m OMNIPAQUE IOHEXOL 350 MG/ML SOLN  COMPARISON:  Radiographs earlier this day. Chest CT 09/22/2018 FINDINGS: Cardiovascular: Contrast bolus timing and breathing motion artifact limits detailed assessment. No central filling defects in the pulmonary arteries, evaluation is diagnostic to the lobar level. There is opacification of multiple segmental and subsegmental branches, however more detailed assessment is limited. Multi chamber cardiomegaly. Minor aortic atherosclerosis without aneurysm. No aortic dissection. No pericardial effusion. There is contrast refluxing into the hepatic veins and IVC. Mediastinum/Nodes: No enlarged mediastinal, hilar, or axillary lymph nodes. Patulous esophagus without wall thickening. No visualized thyroid nodule. Lungs/Pleura: Ground-glass opacities, fairly diffuse throughout the left lung and involving the right lower lobe. Patchy involvement of  the right upper and right middle lobes. Findings are new from prior exam. There is no definite septal thickening. No pneumothorax or confluent airspace disease. No significant pleural fluid. Trachea and mainstem bronchi are patent. Pulmonary nodules on prior exam are obscured by motion. Emphysema. Upper Abdomen: Contrast refluxes into the hepatic veins and IVC. Right renal cyst partially included. Suspected hepatic steatosis. Musculoskeletal: Subcutaneous emphysema throughout the right chest wall which tracks posteriorly and anteriorly chest to the left of midline. This is likely related to recent VATS. Unchanged rounded sclerotic density within T10. degenerative change in the spine. Review of the MIP images confirms the above findings. IMPRESSION: 1. Ground-glass opacities throughout both lungs, diffuse on the left and more patchy in geographic on the right. Findings may represent asymmetric pulmonary edema or infection, including atypical viral organisms. 2. Mild cardiomegaly. Reflux of contrast into the hepatic veins and IVC consistent with elevated right heart pressures. 3. Limited assessment for pulmonary embolus given contrast bolus timing and patient motion, no large central pulmonary embolism. 4. Subcutaneous emphysema in the right chest wall likely related to recent VATS. No pneumothorax. Aortic Atherosclerosis (ICD10-I70.0). Emphysema (ICD10-J43.9). Electronically Signed   By: Keith Rake M.D.   On: 02/21/2019 17:08   Dg Chest Port 1 View  Result Date: 02/07/2019 CLINICAL DATA:  Cough dyspnea. EXAM: PORTABLE CHEST 1 VIEW COMPARISON:  CT chest 09/22/2018, chest x-ray of February 20, 2019 FINDINGS: Heart size is enlarged as before. Bibasilar opacities have developed since the 02/20/2019 with dense retrocardiac opacification and with more interstitial and subtle alveolar opacities in the right chest. Patient reportedly had a lung biopsy. There is no visible pneumothorax on this semi erect view.  Subcutaneous emphysema is noted along the right lateral chest. No acute bone finding. IMPRESSION: 1. Findings that may represent multifocal pneumonia or asymmetric edema. 2. Consolidation or volume loss at the left lung base greater than right. 3. Pulmonary hemorrhage considered on the right given the patient's history of recent right-sided biopsy. 4. No signs of pneumothorax on semi erect view though there is subcutaneous emphysema along the right chest wall. Occult pneumothorax is not excluded. Electronically Signed   By: Zetta Bills M.D.   On: 02/11/2019 11:35   Dg Chest Port 1 View  Result Date: 02/11/2019 CLINICAL DATA:  Chest tube removal EXAM: PORTABLE CHEST 1 VIEW COMPARISON:  February 10, 2019 FINDINGS: Interval removal of right chest tube with new right chest wall subcutaneous emphysema. No definite pneumothorax. Low lung volumes with bibasilar atelectasis. Stable cardiomediastinal contours. IMPRESSION: Post right chest tube removal with new subcutaneous emphysema. No definite pneumothorax. Electronically Signed   By: Macy Mis M.D.   On: 02/11/2019 08:14   Dg Chest Port 1 View  Result Date: 02/10/2019 CLINICAL DATA:  Patient status post right upper, middle and lower lobe wedge resection 02/09/2019. Chest tube in  place. EXAM: PORTABLE CHEST 1 VIEW COMPARISON:  Single-view of the chest 02/09/2019. FINDINGS: Right chest tube remains in place. No pneumothorax. Subsegmental atelectasis in the right lung base has increased. The left lung is clear. Heart size is normal. IMPRESSION: Negative for pneumothorax with a right chest tube in place. Increased subsegmental atelectasis right lung base. Electronically Signed   By: Inge Rise M.D.   On: 02/10/2019 06:43   Dg Chest Port 1 View  Result Date: 02/09/2019 CLINICAL DATA:  Follow-up lung surgery EXAM: PORTABLE CHEST 1 VIEW COMPARISON:  02/09/2019 FINDINGS: Insertion of right-sided chest tube with tip at the apex. No visible pneumothorax.  Mild cardiomegaly. Scarring at the left base. Right mid lung platelike atelectasis. No pleural effusion or consolidation. Coarse chronic interstitial opacity. Small amount of right lower chest wall emphysema. IMPRESSION: 1. Insertion of right-sided chest tube with tip at the apex, no visible pneumothorax 2. Mild cardiomegaly. Minimal platelike atelectasis in the right mid lung. Scarring at the left base Electronically Signed   By: Donavan Foil M.D.   On: 02/09/2019 16:15    Time Spent in minutes 35  Briellah Baik M.D on 02/28/2019 at 10:56 AM  Between 7am to 7pm - Pager - [phone]  After 7pm go to [email or web address] - password [facility]  [facility] -  Office  [phone]

## 2019-02-28 NOTE — Progress Notes (Signed)
PT Cancellation Note  Patient Details Name: Justin Atkinson. MRN: [id number] DOB: [birth year]   Cancelled Treatment:    Reason Eval/Treat Not Completed: Medical issues which prohibited therapy.  Medical issues which prohibited therapy .  Pt is not ready for therapy yet 2* respiratory status.  Will check another day   [facility] 02/28/2019, 8:56 AM

## 2019-02-28 NOTE — Consult Note (Signed)
NAME:  Justin Atkinson, Justin Atkinson MRN:  [id number], DOB:  December 01, 1940, LOS: 1 ADMISSION DATE:  02/09/2019, CONSULTATION DATE:  03/01/2019 REFERRING MD:  Irine Seal, MD CHIEF COMPLAINT:  Acute hypoxemic respiratory failure  Brief History   78 year old male with hx hypersensitivity pneumonitis who presents with acute on chronic hypoxemic respiratory failure  History of present illness   Justin Atkinson. 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   Of note he is followed in pulmonary clinic with Dr. Vaughan Browner for concerns for ILD on CT.  He underwent VATS wedge resection on 11/9 and diagnosed with hypersensitivity pneumonitis. Started on steroids on 11/18 however due to side effects (insomnia) he was decreased to 20 mg daily on 11/24.  Today he called the pulmonary office to report recent nasal congestion and interval development of cough and shortness of breath in the last week.  He also noticed that his oxygenation worsened with the lowest at 89%.  Denies recent fevers or chills or recent sick contacts.  Though he was in the hospital 3 weeks ago for lung biopsy.  In the ED he was hypoxemic to 80% on 8 L nasal cannula.  HR 70 RR 34 BP elevated to 152/53.  CTA obtained however study was limited due to artifact.  No central filling defects in the pulmonary arteries were noted.  Bilateral diffuse groundglass opacities.  On arrival to the ICU he required increased oxygen support.  Currently on heated high flow 100% with flow 45 L/min.  Past Medical History  Hypersensitivity pneumonitis, emphysema, DM2, HTN, HLD, DJD, hx bladder cacner, congenital solitary kidney  Significant Hospital Events   11/27 admitted  Consults:  PCCM  Procedures:    Significant Diagnostic Tests:  CTA 11/27 interval development of bilateral diffuse groundglass opacities compared to 6/22 CT  chest  Micro Data:  11/27 RVP- negative 11/27 SARS-Covid-2- Positive 11/27 blood cultures-pending 11/27 sputum cultures-pending  Antimicrobials:  Vanco 11/27> Cefepime 11/27>  Interim history/subjective:  Tolerating HFNC and face mask. Able to carry on conversation  Objective   Blood pressure 127/63, pulse 63, temperature 98.2 F (36.8 C), temperature source Axillary, resp. rate (!) 24, height 6\' 1"  (1.854 m), weight 130.7 kg, SpO2 92 %.    FiO2 (%):  [90 %-100 %] 100 %   Intake/Output Summary (Last 24 hours) at 02/28/2019 1013 Last data filed at 02/28/2019 [id number] Gross per 24 hour  Intake 277.16 ml  Output 1000 ml  Net -722.84 ml   Filed Weights   03/02/2019 1810 02/28/19 0812  Weight: 130.7 kg 130.7 kg   Physical Exam: General: Well-appearing, no acute distress HENT: [place], AT, OP clear, MMM Eyes: EOMI, no scleral icterus Respiratory: Anterior crackles bilaterally Cardiovascular: RRR, -M/R/G, no JVD GI: BS+, soft, nontender Extremities:-Edema,-tenderness Neuro: AAO x4, CNII-XII grossly intact Skin: Intact, no rashes or bruising Psych: Normal mood, normal affect  Resolved Hospital Problem list     Assessment & Plan:   Acute hypoxemic respiratory failure secondary to COVID-19: High risk for intubation --Maintain O2 saturations for goal greater than 85% at rest and 75% on exertion --Monitor closely for signs of ventilatory failure such as nasal flaring, accessory muscle use, abdominal paradoxical movements. --Self-proning as tolerated  --Solumedrol 0.5-1 mg/kg daily  --Pulmonary hygiene including bronchodilator and flutter valve. AVOID manual percussion. Mobilize as tolerated --Remdesivir protocol x 5 days.  --If D-dimer >5  ug/ml +/- critical illness, start weight based DVT ppx --Trend CRP, d-dimer and ferritin  Sepsis secondary to COVID-19 F/u blood cultures  Continue antibiotics for HAP for co-comitant bacterial pneumonia  Hypersensitivity pneumonitis  --Management with steroids and oxygen as above --After completion of solumedro, wean to baseline prednisone 20 mg daily   Emphysema --Continue home Anoro  Best practice:  Diet: Carb modified Pain/Anxiety/Delirium protocol (if indicated): -- VAP protocol (if indicated): -- DVT prophylaxis: Lovenox per COVID protocol GI prophylaxis: PPI Glucose control: Per primary Mobility: As tolerated Code Status: DNR Family Communication: Updated patient at bedside 11/28 Disposition: Remain in ICU  Labs   CBC: Recent Labs  Lab 02/25/2019 1147 02/28/19 0229  WBC 8.9 8.3  NEUTROABS 7.5 7.5  HGB 13.4 13.6  HCT 41.3 42.6  MCV 94.7 96.4  PLT 257 [id number]    Basic Metabolic Panel: Recent Labs  Lab 02/18/2019 1147 02/28/19 0229  NA 136 137  K 3.9 4.2  CL 102 104  CO2 24 23  GLUCOSE 251* 245*  BUN 24* 22  CREATININE 0.88 0.89  CALCIUM 8.5* 8.4*   GFR: Estimated Creatinine Clearance: 100.1 mL/min (by C-G formula based on SCr of 0.89 mg/dL). Recent Labs  Lab 02/09/2019 1147 02/07/2019 1400 02/28/19 0229 02/28/19 0757  PROCALCITON 0.13  --   --  <0.10  WBC 8.9  --  8.3  --   LATICACIDVEN 2.3* 1.4  --   --     Liver Function Tests: Recent Labs  Lab 02/17/2019 1147  AST 40  ALT 48*  ALKPHOS 72  BILITOT 1.0  PROT 6.7  ALBUMIN 3.3*   No results for input(s): LIPASE, AMYLASE in the last 168 hours. No results for input(s): AMMONIA in the last 168 hours.  ABG    Component Value Date/Time   PHART 7.425 02/15/2019 1106   PCO2ART 38.7 02/22/2019 1106   PO2ART 57.2 (L) 02/09/2019 1106   HCO3 24.9 02/12/2019 1106   O2SAT 88.1 02/18/2019 1106     Coagulation Profile: No results for input(s): INR, PROTIME in the last 168 hours.  Cardiac Enzymes: No results for input(s): CKTOTAL, CKMB, CKMBINDEX, TROPONINI in the last 168 hours.  HbA1C: Hgb A1c MFr Bld  Date/Time Value Ref Range Status  02/28/2019 02:29 AM 7.0 (H) 4.8 - 5.6 % Final    Comment:    (NOTE) Pre diabetes:           5.7%-6.4% Diabetes:              >6.4% Glycemic control for   <7.0% adults with diabetes     CBG: Recent Labs  Lab 02/02/2019 1856 02/18/2019 2057 02/28/19 0816  GLUCAP 186* 206* 241*   Critical care time: 39 min    The patient is critically ill with multiple organ systems failure and requires high complexity decision making for assessment and support, frequent evaluation and titration of therapies, application of advanced monitoring technologies and extensive interpretation of multiple databases.   Rodman Pickle, M.D. [facility] Pulmonary/Critical Care Medicine 02/28/2019 10:13 AM   Please see Amion for pager number to reach on-call Pulmonary and Critical Care Team.

## 2019-02-28 NOTE — Progress Notes (Signed)
OT Cancellation Note  Patient Details Name: Justin Atkinson. MRN: [id number] DOB: 11-06-1940   Cancelled Treatment:    Reason Eval/Treat Not Completed: Medical issues which prohibited therapy .  Pt is not ready for therapy yet 2* respiratory status.  Will check another day Justin Atkinson 02/28/2019, 8:08 AM  Lesle Chris, OTR/L Acute Rehabilitation Services [phone] [facility] pager [phone] office 02/28/2019

## 2019-02-28 NOTE — Progress Notes (Signed)
Pt. Has HHNC in place, flow increased to 55 and Fio2 remains 100% along with a PRB mask, flushed.  Pt. States he is without distress.  Pt. Appears calm and is watching TV.

## 2019-02-28 NOTE — Progress Notes (Signed)
Oxygen saturations mid 80's despite previous nurse increasing high flow to 100% and 50L.  Patient still alert and oriented x4.  Sat probe changed out with no improvement.  Non rebreather added.

## 2019-03-01 DIAGNOSIS — I10 Essential (primary) hypertension: Secondary | ICD-10-CM | POA: Diagnosis present

## 2019-03-01 DIAGNOSIS — U071 COVID-19: Secondary | ICD-10-CM | POA: Diagnosis present

## 2019-03-01 DIAGNOSIS — E7800 Pure hypercholesterolemia, unspecified: Secondary | ICD-10-CM

## 2019-03-01 DIAGNOSIS — E118 Type 2 diabetes mellitus with unspecified complications: Secondary | ICD-10-CM | POA: Diagnosis present

## 2019-03-01 DIAGNOSIS — R918 Other nonspecific abnormal finding of lung field: Secondary | ICD-10-CM

## 2019-03-01 DIAGNOSIS — R6520 Severe sepsis without septic shock: Secondary | ICD-10-CM

## 2019-03-01 DIAGNOSIS — A419 Sepsis, unspecified organism: Secondary | ICD-10-CM

## 2019-03-01 LAB — CBC
HCT: 44.8 % (ref 39.0–52.0)
Hemoglobin: 14.2 g/dL (ref 13.0–17.0)
MCH: 30.1 pg (ref 26.0–34.0)
MCHC: 31.7 g/dL (ref 30.0–36.0)
MCV: 95.1 fL (ref 80.0–100.0)
Platelets: 362 10*3/uL (ref 150–400)
RBC: 4.71 MIL/uL (ref 4.22–5.81)
RDW: 13.3 % (ref 11.5–15.5)
WBC: 13.2 10*3/uL — ABNORMAL HIGH (ref 4.0–10.5)
nRBC: 0 % (ref 0.0–0.2)

## 2019-03-01 LAB — COMPREHENSIVE METABOLIC PANEL
ALT: 63 U/L — ABNORMAL HIGH (ref 0–44)
AST: 42 U/L — ABNORMAL HIGH (ref 15–41)
Albumin: 3.2 g/dL — ABNORMAL LOW (ref 3.5–5.0)
Alkaline Phosphatase: 88 U/L (ref 38–126)
Anion gap: 12 (ref 5–15)
BUN: 31 mg/dL — ABNORMAL HIGH (ref 8–23)
CO2: 24 mmol/L (ref 22–32)
Calcium: 9.1 mg/dL (ref 8.9–10.3)
Chloride: 105 mmol/L (ref 98–111)
Creatinine, Ser: 0.92 mg/dL (ref 0.61–1.24)
GFR calc Af Amer: >60 mL/min (ref >60)
GFR calc non Af Amer: >60 mL/min (ref >60)
Glucose, Bld: 241 mg/dL — ABNORMAL HIGH (ref 70–99)
Potassium: 4.1 mmol/L (ref 3.5–5.1)
Sodium: 141 mmol/L (ref 135–145)
Total Bilirubin: 0.8 mg/dL (ref 0.3–1.2)
Total Protein: 7.2 g/dL (ref 6.5–8.1)

## 2019-03-01 LAB — GLUCOSE, CAPILLARY
Glucose-Capillary: 292 mg/dL — ABNORMAL HIGH (ref 70–99)
Glucose-Capillary: 221 mg/dL — ABNORMAL HIGH (ref 70–99)
Glucose-Capillary: 238 mg/dL — ABNORMAL HIGH (ref 70–99)
Glucose-Capillary: 230 mg/dL — ABNORMAL HIGH (ref 70–99)
Glucose-Capillary: 259 mg/dL — ABNORMAL HIGH (ref 70–99)

## 2019-03-01 LAB — SAMPLE TO BLOOD BANK

## 2019-03-01 LAB — MAGNESIUM: Magnesium: 2.2 mg/dL (ref 1.7–2.4)

## 2019-03-01 LAB — URINE CULTURE: Culture: NO GROWTH

## 2019-03-01 LAB — LEGIONELLA PNEUMOPHILA SEROGP 1 UR AG: L. pneumophila Serogp 1 Ur Ag: NEGATIVE

## 2019-03-01 LAB — D-DIMER, QUANTITATIVE: D-Dimer, Quant: 1.17 ug/mL-FEU — ABNORMAL HIGH (ref 0.00–0.50)

## 2019-03-01 LAB — FERRITIN: Ferritin: 837 ng/mL — ABNORMAL HIGH (ref 24–336)

## 2019-03-01 LAB — INFLUENZA PANEL BY PCR (TYPE A & B)
Influenza A By PCR: NEGATIVE
Influenza B By PCR: NEGATIVE

## 2019-03-01 LAB — PROCALCITONIN: Procalcitonin: <0.1 ng/mL

## 2019-03-01 LAB — C-REACTIVE PROTEIN: CRP: 11 mg/dL — ABNORMAL HIGH (ref <1.0)

## 2019-03-01 MED ORDER — METOPROLOL TARTRATE 5 MG/5ML IV SOLN
5.0000 mg | Freq: Four times a day (QID) | INTRAVENOUS | Status: DC
  Administered 2019-03-01 – 2019-03-04 (×10): 5 mg via INTRAVENOUS
  Filled 2019-03-01 (×11): qty 5

## 2019-03-01 MED ORDER — LORAZEPAM 2 MG/ML IJ SOLN
1.0000 mg | Freq: Four times a day (QID) | INTRAMUSCULAR | Status: DC | PRN
  Administered 2019-03-03: 2 mg via INTRAVENOUS
  Filled 2019-03-01: qty 1

## 2019-03-01 MED ORDER — MORPHINE SULFATE (PF) 2 MG/ML IV SOLN
1.0000 mg | INTRAVENOUS | Status: DC | PRN
  Administered 2019-03-01 – 2019-03-04 (×4): 2 mg via INTRAVENOUS
  Filled 2019-03-01 (×4): qty 1

## 2019-03-01 MED ORDER — HYDRALAZINE HCL 20 MG/ML IJ SOLN: 5.0000 mg | INTRAMUSCULAR | Status: DC | PRN

## 2019-03-01 MED ORDER — INSULIN DETEMIR 100 UNIT/ML ~~LOC~~ SOLN
10.0000 [IU] | Freq: Every day | SUBCUTANEOUS | Status: DC
  Administered 2019-03-01: 10 [IU] via SUBCUTANEOUS
  Filled 2019-03-01 (×2): qty 0.1

## 2019-03-01 MED ORDER — TOCILIZUMAB 400 MG/20ML IV SOLN
800.0000 mg | Freq: Once | INTRAVENOUS | Status: AC
  Administered 2019-03-01: 800 mg via INTRAVENOUS
  Filled 2019-03-01: qty 40

## 2019-03-01 NOTE — Progress Notes (Signed)
Patient to transfer to [facility] report given to receiving nurse Levada Dy RN, all questions answered at this time.  Pt. To transport via [facility] with non-rebreather.

## 2019-03-01 NOTE — Progress Notes (Signed)
OT Cancellation Note  Patient Details Name: Danyal Kennon. MRN: [id number] DOB: 09/15/1940   Cancelled Treatment:    Reason Eval/Treat Not Completed: Medical issues which prohibited therapy:  Decreased sats/proned  Sajan Cheatwood 03/01/2019, 10:50 AM  Lesle Chris, OTR/L Acute Rehabilitation Services [phone] [facility] pager [phone] office 03/01/2019

## 2019-03-01 NOTE — Progress Notes (Signed)
PROGRESS NOTE    Justin Atkinson.  OQH:[id number] DOB: 24-Mar-1941 DOA: 02/03/2019 PCP: [name], MD    Brief Narrative:  78 year old male with history of OPD/pulmonary fibrosis recently diagnosed hypersensitivity pneumonitis (had VATS wedge resection on 11/9 ; follows with Dr. Vaughan Browner) who was recently started on steroid on 11/18, hypertension, diabetes mellitus type 2, hyperlipidemia and DJD presented with increasing shortness of breath with nasal congestion, cough for about 10 days.  Also noticed that his oxygen dropped to 89% on room air.  Called the pulmonary office who asked him to come to the ED. In the ED he is hypoxic at 80%, tachypneic and mildly hypertensive.  CT angiogram of the chest was negative for PE and showed bilateral diffuse groundglass opacity. Admitted to ICU on high flow started on IV Solu-Medrol.  COVID-19 test returned as positive.  In the ED he had elevated inflammatory markers including LDH, ferritin, CRP, lactic acid and D-dimer. Patient remains on high flow oxygen. 03/01/2019: Remains 100% high flow nasal cannula oxygen.  Transferring to [place] to continue treatment.  Assessment & Plan:   Principal Problem:   Acute respiratory failure with hypoxia (HCC) Active Problems:   Hypertension   Hypercholesterolemia   Benign prostate hyperplasia   ILD (interstitial lung disease) ([place])   Postinflammatory pulmonary fibrosis (HCC)   HCAP (healthcare-associated pneumonia)   Chronic obstructive pulmonary disease (HCC)  Acute respiratory failure with hypoxia due to COVID-19 pneumonia: Continue to monitor due to significant symptoms. chest physiotherapy, incentive spirometry, deep breathing exercises, sputum induction, mucolytic's and bronchodilators.  Proning as able.  Patient is trying to lay on his left side. Supplemental oxygen to keep saturations more than 89%. Covid directed therapy with , steroids on Solu-Medrol remdesivir day 2 antibiotics with  suspected healthcare associated pneumonia with recent hospitalization and worsening hypoxia.  Patient remains on vancomycin and cefepime that we will continue due to severity of symptoms. Due to severity of symptoms, patient will need daily inflammatory markers, chest x-rays, liver function test to monitor and direct COVID-19 therapies.  Severe sepsis: Due to above.  On empiric antibiotics.  Cultures negative so far.  Hypersensitivity pneumonitis: On IV steroids.  Will ultimately go back on his oral maintenance steroids after clinical improvement.  COPD with emphysema: Bronchodilators as above.  Hypertension: On Avapro that will continue.  Type 2 diabetes: Patient on Metformin at home.  Currently on high-dose steroids.  Blood sugars persistently more than 200.  He is on sliding scale, however due to persistent high blood sugars, will start on Levemir tonight.   DVT prophylaxis: Lovenox subcu Code Status: DNR Family Communication: None today Disposition Plan: Transferring to [facility] [place] to continue acute inpatient treatment   Consultants:   PCCM  Procedures:   None  Antimicrobials:  Cefepime, 02/24/2019>>> Remdesivir, 02/28/2019>>>    Subjective: Patient seen and examined.  No overnight events.  Unable to maintain on nonrebreather.  Currently on 100% FiO2 on high flow nasal cannula oxygen.  Even though patient is on very high flow oxygen, he was able to keep up conversation.  Denied any chest pain.  Tolerating oxygen well.  Denies any obvious distress.  No nausea or vomiting.  Objective: Vitals:   03/01/19 0741 03/01/19 0755 03/01/19 0900 03/01/19 1018  BP: (!) 165/94 (!) 185/94    Pulse: 70 73 64 71  Resp: 18 (!) 28 (!) 25 (!) 26  Temp: 97.7 F (36.5 C)     TempSrc:  SpO2: (!) 81% 93% 92% 92%  Weight:      Height:        Intake/Output Summary (Last 24 hours) at 03/01/2019 1047 Last data filed at 03/01/2019 0906 Gross per 24 hour  Intake 297.63 ml    Output 1200 ml  Net -902.37 ml   Filed Weights   02/25/2019 1810 02/28/19 0812  Weight: 130.7 kg 130.7 kg    Examination:  General exam: Appears calm.  Sick looking.  On very high flow oxygen. Respiratory system: Bilateral fine crackles and some conducted airway sounds.  In mild distress. Cardiovascular system: S1 & S2 heard, RRR. No JVD, murmurs, rubs, gallops or clicks. No pedal edema. Gastrointestinal system: Abdomen is nondistended, soft and nontender. No organomegaly or masses felt. Normal bowel sounds heard. Central nervous system: Alert and oriented. No focal neurological deficits. Extremities: Symmetric 5 x 5 power. Skin: No rashes, lesions or ulcers Psychiatry: Judgement and insight appear normal. Mood & affect appropriate.     Data Reviewed: I have personally reviewed following labs and imaging studies  CBC: Recent Labs  Lab 02/25/2019 1147 02/28/19 0229 03/01/19 0333  WBC 8.9 8.3 13.2*  NEUTROABS 7.5 7.5  --   HGB 13.4 13.6 14.2  HCT 41.3 42.6 44.8  MCV 94.7 96.4 95.1  PLT 257 272 834   Basic Metabolic Panel: Recent Labs  Lab 02/28/2019 1147 02/28/19 0229 03/01/19 0333  NA 136 137 141  K 3.9 4.2 4.1  CL 102 104 105  CO2 '24 23 24  '$ GLUCOSE 251* 245* 241*  BUN 24* 22 31*  CREATININE 0.88 0.89 0.92  CALCIUM 8.5* 8.4* 9.1   GFR: Estimated Creatinine Clearance: 96.8 mL/min (by C-G formula based on SCr of 0.92 mg/dL). Liver Function Tests: Recent Labs  Lab 03/01/2019 1147 03/01/19 0333  AST 40 42*  ALT 48* 63*  ALKPHOS 72 88  BILITOT 1.0 0.8  PROT 6.7 7.2  ALBUMIN 3.3* 3.2*   No results for input(s): LIPASE, AMYLASE in the last 168 hours. No results for input(s): AMMONIA in the last 168 hours. Coagulation Profile: No results for input(s): INR, PROTIME in the last 168 hours. Cardiac Enzymes: No results for input(s): CKTOTAL, CKMB, CKMBINDEX, TROPONINI in the last 168 hours. BNP (last 3 results) No results for input(s): PROBNP in the last 8760  hours. HbA1C: Recent Labs    02/28/19 0229  HGBA1C 7.0*   CBG: Recent Labs  Lab 02/03/2019 1856 02/13/2019 2057 02/28/19 0816 02/28/19 1241 02/28/19 1618  GLUCAP 186* 206* 241* 217* 292*   Lipid Profile: Recent Labs    03/01/2019 1147  TRIG 97   Thyroid Function Tests: No results for input(s): TSH, T4TOTAL, FREET4, T3FREE, THYROIDAB in the last 72 hours. Anemia Panel: Recent Labs    02/28/19 0757 03/01/19 0333  FERRITIN 763* 837*   Sepsis Labs: Recent Labs  Lab 02/28/2019 1147 02/05/2019 1400 02/28/19 0757  PROCALCITON 0.13  --  <0.10  LATICACIDVEN 2.3* 1.4  --     Recent Results (from the past 240 hour(s))  Blood culture (routine x 2)     Status: None (Preliminary result)   Collection Time: 02/05/2019 11:48 AM   Specimen: BLOOD  Result Value Ref Range Status   Specimen Description   Final    BLOOD RIGHT ANTECUBITAL Performed at [facility], [place] [address] [place],  [address]    Special Requests   Final    BOTTLES DRAWN AEROBIC AND ANAEROBIC Blood Culture results may not be optimal  due to an excessive volume of blood received in culture bottles Performed at [facility] [address], [place], [place] [address]    Culture   Final    NO GROWTH 2 DAYS Performed at [facility] [address] [place], [place] [address]    Report Status PENDING  Incomplete  Blood culture (routine x 2)     Status: None (Preliminary result)   Collection Time: 02/26/2019 11:50 AM   Specimen: BLOOD RIGHT HAND  Result Value Ref Range Status   Specimen Description   Final    BLOOD RIGHT HAND Performed at [facility] [address] [place], [place] [address]    Special Requests   Final    BOTTLES DRAWN AEROBIC AND ANAEROBIC Blood Culture results may not be optimal due to an excessive volume of blood received in culture bottles Performed at [facility] [address] [place], [place]  [address]    Culture   Final    NO GROWTH 2 DAYS Performed at [facility] [address] [place], [place] [address]    Report Status PENDING  Incomplete  SARS CORONAVIRUS 2 (TAT 6-24 HRS) Nasopharyngeal Nasopharyngeal Swab     Status: Abnormal   Collection Time: 02/04/2019  2:00 PM   Specimen: Nasopharyngeal Swab  Result Value Ref Range Status   SARS Coronavirus 2 POSITIVE (A) NEGATIVE Final    Comment: RESULT CALLED TO, READ BACK BY AND VERIFIED WITH: K.GARCIA RN 1950 02/09/2019 MCCORMICK K (NOTE) SARS-CoV-2 target nucleic acids are DETECTED. The SARS-CoV-2 RNA is generally detectable in upper and lower respiratory specimens during the acute phase of infection. Positive results are indicative of the presence of SARS-CoV-2 RNA. Clinical correlation with patient history and other diagnostic information is  necessary to determine patient infection status. Positive results do not rule out bacterial infection or co-infection with other viruses.  The expected result is Negative. Fact Sheet for Patients: SugarRoll.be Fact Sheet for Healthcare Providers: https://www.woods-mathews.com/ This test is not yet approved or cleared by the [place] FDA and  has been authorized for detection and/or diagnosis of SARS-CoV-2 by FDA under an Emergency Use Authorization (EUA). This EUA will remain  in effect (meaning this test can be used) for  the duration of the COVID-19 declaration under Section 564(b)(1) of the Act, 21 U.S.C. section 360bbb-3(b)(1), unless the authorization is terminated or revoked sooner. Performed at [facility], [place] [address] [place], [place] [address]   Culture, sputum-assessment     Status: None   Collection Time: 02/22/2019  4:04 PM   Specimen: Sputum  Result Value Ref Range Status   Specimen Description SPUTUM  Final   Special Requests NONE  Final   Sputum evaluation   Final    Sputum specimen not acceptable  for testing.  Please recollect.   RESULT CALLED TO, READ BACK BY AND VERIFIED WITH: I [facility] 02/28/19 2116 RHOLMES Performed at [facility], [place] [address], [place],  [address]    Report Status 02/28/2019 FINAL  Final  Respiratory Panel by PCR     Status: None   Collection Time: 02/02/2019  6:45 PM   Specimen: Flu Kit Nasopharyngeal Swab; Respiratory  Result Value Ref Range Status   Adenovirus NOT DETECTED NOT DETECTED Final   Coronavirus 229E NOT DETECTED NOT DETECTED Final    Comment: (NOTE) The Coronavirus on the Respiratory Panel, DOES NOT test for the novel  Coronavirus (2019  nCoV)    Coronavirus HKU1 NOT DETECTED NOT DETECTED Final   Coronavirus NL63 NOT DETECTED NOT DETECTED Final   Coronavirus OC43 NOT DETECTED NOT DETECTED Final   Metapneumovirus NOT DETECTED NOT DETECTED Final   Rhinovirus / Enterovirus NOT DETECTED NOT DETECTED Final   Influenza A NOT DETECTED NOT DETECTED Final   Influenza B NOT DETECTED NOT DETECTED Final   Parainfluenza Virus 1 NOT DETECTED NOT DETECTED Final   Parainfluenza Virus 2 NOT DETECTED NOT DETECTED Final   Parainfluenza Virus 3 NOT DETECTED NOT DETECTED Final   Parainfluenza Virus 4 NOT DETECTED NOT DETECTED Final   Respiratory Syncytial Virus NOT DETECTED NOT DETECTED Final   Bordetella pertussis NOT DETECTED NOT DETECTED Final   Chlamydophila pneumoniae NOT DETECTED NOT DETECTED Final   Mycoplasma pneumoniae NOT DETECTED NOT DETECTED Final    Comment: Performed at [facility], [place] [address] [place], [place] [address]  MRSA PCR Screening     Status: None   Collection Time: 02/23/2019  6:45 PM   Specimen: Nasal Mucosa; Nasopharyngeal  Result Value Ref Range Status   MRSA by PCR NEGATIVE NEGATIVE Final    Comment:        The GeneXpert MRSA Assay (FDA approved for NASAL specimens only), is one component of a comprehensive MRSA colonization surveillance program. It is not intended to diagnose  MRSA infection nor to guide or monitor treatment for MRSA infections. Performed at [facility], [place] [address] [place], [place] [address]   Urine culture     Status: None   Collection Time: 02/07/2019  7:15 PM   Specimen: Urine, Random  Result Value Ref Range Status   Specimen Description   Final    URINE, RANDOM Performed at [facility] [address] [place], [place] [address]    Special Requests   Final    NONE Performed at [facility], [place] [address] [place], [place] [address]    Culture   Final    NO GROWTH Performed at [facility], [place] [address] [place],  [address]    Report Status 03/01/2019 FINAL  Final         Radiology Studies: Ct Angio Chest Pe W And/or Wo Contrast  Result Date: 02/01/2019 CLINICAL DATA:  PE suspected, intermediate prob, neg D-dimer. Cough and shortness of breath. Recent VATS lung biopsy 02/09/2019. EXAM: CT ANGIOGRAPHY CHEST WITH CONTRAST TECHNIQUE: Multidetector CT imaging of the chest was performed using the standard protocol during bolus administration of intravenous contrast. Multiplanar CT image reconstructions and MIPs were obtained to evaluate the vascular anatomy. CONTRAST:  169m OMNIPAQUE IOHEXOL 350 MG/ML SOLN COMPARISON:  Radiographs earlier this day. Chest CT 09/22/2018 FINDINGS: Cardiovascular: Contrast bolus timing and breathing motion artifact limits detailed assessment. No central filling defects in the pulmonary arteries, evaluation is diagnostic to the lobar level. There is opacification of multiple segmental and subsegmental branches, however more detailed assessment is limited. Multi chamber cardiomegaly. Minor aortic atherosclerosis without aneurysm. No aortic dissection. No pericardial effusion. There is contrast refluxing into the hepatic veins and IVC. Mediastinum/Nodes: No enlarged mediastinal, hilar, or axillary lymph nodes. Patulous esophagus without  wall thickening. No visualized thyroid nodule. Lungs/Pleura: Ground-glass opacities, fairly diffuse throughout the left lung and involving the right lower lobe. Patchy involvement of the right upper and right middle lobes. Findings are new from prior exam. There is no definite septal thickening. No pneumothorax or confluent airspace disease. No significant pleural fluid.  Trachea and mainstem bronchi are patent. Pulmonary nodules on prior exam are obscured by motion. Emphysema. Upper Abdomen: Contrast refluxes into the hepatic veins and IVC. Right renal cyst partially included. Suspected hepatic steatosis. Musculoskeletal: Subcutaneous emphysema throughout the right chest wall which tracks posteriorly and anteriorly chest to the left of midline. This is likely related to recent VATS. Unchanged rounded sclerotic density within T10. degenerative change in the spine. Review of the MIP images confirms the above findings. IMPRESSION: 1. Ground-glass opacities throughout both lungs, diffuse on the left and more patchy in geographic on the right. Findings may represent asymmetric pulmonary edema or infection, including atypical viral organisms. 2. Mild cardiomegaly. Reflux of contrast into the hepatic veins and IVC consistent with elevated right heart pressures. 3. Limited assessment for pulmonary embolus given contrast bolus timing and patient motion, no large central pulmonary embolism. 4. Subcutaneous emphysema in the right chest wall likely related to recent VATS. No pneumothorax. Aortic Atherosclerosis (ICD10-I70.0). Emphysema (ICD10-J43.9). Electronically Signed   By: Keith Rake M.D.   On: 02/08/2019 17:08   Dg Chest Port 1 View  Result Date: 02/08/2019 CLINICAL DATA:  Cough dyspnea. EXAM: PORTABLE CHEST 1 VIEW COMPARISON:  CT chest 09/22/2018, chest x-ray of February 20, 2019 FINDINGS: Heart size is enlarged as before. Bibasilar opacities have developed since the 02/20/2019 with dense retrocardiac  opacification and with more interstitial and subtle alveolar opacities in the right chest. Patient reportedly had a lung biopsy. There is no visible pneumothorax on this semi erect view. Subcutaneous emphysema is noted along the right lateral chest. No acute bone finding. IMPRESSION: 1. Findings that may represent multifocal pneumonia or asymmetric edema. 2. Consolidation or volume loss at the left lung base greater than right. 3. Pulmonary hemorrhage considered on the right given the patient's history of recent right-sided biopsy. 4. No signs of pneumothorax on semi erect view though there is subcutaneous emphysema along the right chest wall. Occult pneumothorax is not excluded. Electronically Signed   By: Zetta Bills M.D.   On: 02/26/2019 11:35        Scheduled Meds:  aspirin EC  81 mg Oral Daily   Chlorhexidine Gluconate Cloth  6 each Topical Daily   enoxaparin (LOVENOX) injection  60 mg Subcutaneous BID   fluticasone  2 spray Each Nare Daily   guaiFENesin  1,200 mg Oral BID   insulin aspart  0-20 Units Subcutaneous TID WC   irbesartan  75 mg Oral Daily   latanoprost  1 drop Both Eyes QHS   loratadine  10 mg Oral Daily   mouth rinse  15 mL Mouth Rinse BID   methylPREDNISolone (SOLU-MEDROL) injection  60 mg Intravenous Q12H   omega-3 acid ethyl esters  1 g Oral Daily   pantoprazole  40 mg Oral Daily   rosuvastatin  10 mg Oral Daily   timolol  1 drop Left Eye BID   umeclidinium-vilanterol  1 puff Inhalation Daily   Continuous Infusions:  ceFEPime (MAXIPIME) IV Stopped (03/01/19 0825)   remdesivir 100 mg in NS 250 mL 100 mg (03/01/19 0938)     LOS: 2 days    Time spent: 35 minutes    Barb Merino, MD [facility] Pager [phone]

## 2019-03-01 NOTE — Progress Notes (Signed)
Pt states he will try to side lie tonight when going to sleep.Ellamae Sia

## 2019-03-01 NOTE — Progress Notes (Signed)
Pt encouraged to lay as much towards his belly in the prone position as he could tolerate. SATs noticeably better when he is able.  Elvina Sidle RT spoke with Esmond Plants RT for handoff report. Respiratory at [facility] is prepared for patient's arrival.   Pt trialed on NRB, SATs maintained in 70s. Vitals otherwise stable, pt able to communicate well for discomfort. [name] SATs drop with exertion.

## 2019-03-01 NOTE — Progress Notes (Signed)
Pt and his wife agree that patients daughter [name] should be first contact and will be the one receiving updates. [name] wife has MS and has caregiver. Ellamae Sia

## 2019-03-01 NOTE — Progress Notes (Signed)
PT Cancellation Note  Patient Details Name: Justin Atkinson. MRN: [id number] DOB: Sep 28, 1940   Cancelled Treatment:     PT order received but eval deferred at request of RN 2* pt SOB.  Will follow.   Daden Mahany 03/01/2019, 8:24 AM

## 2019-03-01 NOTE — Progress Notes (Addendum)
PROGRESS NOTE    Justin Atkinson.  PIR:[id number] DOB: 1941/01/30 DOA: 02/01/2019 PCP: [name], MD   Brief Narrative:  78 year old  WM PMHx COPD/pulmonary fibrosis recently diagnosed hypersensitivity pneumonitis (had VATS wedge resection on 11/9;follows with Dr. Vaughan Browner) who was recently started on steroid on 11/18, HTN, diabetes type 2 uncontrolled with complication, HLD, and DJD   Presented with increasing shortness of breath with nasal congestion, cough for about 10 days. Also noticed that his oxygen dropped to 89% on room air. Called the pulmonary office who asked him to come to the ED. In the ED he is hypoxicat80%, tachypneic and mildly hypertensive. CT angiogram of the chest was negative for PE and showed bilateral diffuse groundglass opacity. Admitted to ICU on high flow started on IV Solu-Medrol. COVID-19 test returned as positive. In the ED he had elevated inflammatory markers including LDH, ferritin, CRP, lactic acid and D-dimer. Patient remains on high flow oxygen. 03/01/2019: Remains 100% high flow nasal cannula oxygen.  Transferring to [place] to continue treatment.   Subjective: Last 24 hours afebrile A/O x4, negative CP, negative abdominal pain, negative N/V, positive S OB   Assessment & Plan:   Principal Problem:   Acute respiratory failure with hypoxia (HCC) Active Problems:   Hypertension   Hypercholesterolemia   Benign prostate hyperplasia   ILD (interstitial lung disease) (HCC)   Postinflammatory pulmonary fibrosis (HCC)   HCAP (healthcare-associated pneumonia)   Chronic obstructive pulmonary disease ([place])   Pneumonia due to COVID-19 virus   Severe sepsis ([place])   Essential hypertension   Diabetes mellitus type 2, controlled, with complications ([place])   Covid pneumonia/acute respiratory failure with hypoxia COVID-19 Labs  Recent Labs    02/12/2019 1147 02/28/19 0757 03/01/19 0333  DDIMER 1.32* 1.02* 1.17*  FERRITIN 756* 763*  837*  LDH 387*  --   --   CRP 11.7* 15.5* 11.0*    Lab Results  Component Value Date   SARSCOV2NAA POSITIVE (A) 02/03/2019   SARSCOV2NAA NOT DETECTED 02/05/2019   SARSCOV2NAA NOT DETECTED 01/09/2019   -Solu-Medrol 60 mg BID -Remdesivir per pharmacy protocol -11/29 Actemra x1 dose -Combivent -Flutter valve -Incentive spirometry -Titrate O2 to maintain SPO2> 88% -Prone patient 16 hours/day; if patient cannot tolerate prone at least 2 to 3 hours per shift -Ativan PRN -Morphine PRN   HCAP/severe sepsis -Trend procalcitonin Results for BRODIN, GELPI (MRN [id number]) as of 03/01/2019 18:22  Ref. Range 02/07/2019 11:47 02/28/2019 07:57 03/01/2019 03:33  Procalcitonin Latest Units: ng/mL 0.13 <0.10 <0.10  -Continue cefepime x7 days  COPD? -Per patient had CT last Friday which did not show COPD -Had biopsy 2 weeks ago by Dr. Vaughan Browner PCCM -See Covid pneumonia  Hypertension -Irbesartan 75 mg daily -Metoprolol IV 5 mg QID  -Hydralazine IV PRN   HLD -Crestor 10 mg daily  Diabetes type 2 controlled with complication -32/35 hemoglobin A1c= 7.0 -Resistant SSI    DVT prophylaxis: Lovenox  Code Status: Full Family Communication:  Disposition Plan: TBD   Consultants:    Procedures/Significant Events:     I have personally reviewed and interpreted all radiology studies and my findings are as above.  VENTILATOR SETTINGS: HFNC+ NRB 11/29 Flow; 40 L/min FiO2; 100% SPO2; 91%  Cultures     Antimicrobials: Anti-infectives (From admission, onward)   Start     Stop   03/01/19 0800  remdesivir 100 mg in sodium chloride 0.9 % 250 mL IVPB     03/05/19 0759   02/28/19 1000  remdesivir 200 mg in sodium chloride 0.9 % 250 mL IVPB     02/28/19 1006   02/28/19 0000  vancomycin (VANCOCIN) 1,250 mg in sodium chloride 0.9 % 250 mL IVPB  Status:  Discontinued     02/21/2019 2037   02/22/2019 2200  ceFEPIme (MAXIPIME) 2 g in sodium chloride 0.9 % 100 mL IVPB          02/09/2019 1315  ceFEPIme (MAXIPIME) 2 g in sodium chloride 0.9 % 100 mL IVPB     02/28/19 0324   02/26/2019 1315  vancomycin (VANCOCIN) 2,000 mg in sodium chloride 0.9 % 500 mL IVPB     02/28/19 0324       Devices    LINES / TUBES:      Continuous Infusions: . ceFEPime (MAXIPIME) IV 2 g (03/01/19 1733)  . remdesivir 100 mg in NS 250 mL Stopped (03/01/19 1008)     Objective: Vitals:   03/01/19 1500 03/01/19 1600 03/01/19 1700 03/01/19 1800  BP: (!) 154/96 (!) 167/93 (!) 177/93 (!) 171/93  Pulse: 80 79 71 85  Resp: (!) 30 18 (!) 26 (!) 26  Temp:   98 F (36.7 C)   TempSrc:   Oral   SpO2: 91% (!) 86% 91% (!) 79%  Weight:      Height:        Intake/Output Summary (Last 24 hours) at 03/01/2019 1849 Last data filed at 03/01/2019 1800 Gross per 24 hour  Intake 807.56 ml  Output 1450 ml  Net -642.44 ml   Filed Weights   02/20/2019 1810 02/28/19 0812  Weight: 130.7 kg 130.7 kg    Examination:  General: A/O x4, positive acute respiratory distress Eyes: negative scleral hemorrhage, negative anisocoria, negative icterus ENT: Negative Runny nose, negative gingival bleeding, Neck:  Negative scars, masses, torticollis, lymphadenopathy, JVD Lungs: Tachypneic, diffuse poor air movement, without wheezes or crackles Cardiovascular: Regular rate and rhythm without murmur gallop or rub normal S1 and S2 Abdomen: negative abdominal pain, nondistended, positive soft, bowel sounds, no rebound, no ascites, no appreciable mass Extremities: No significant cyanosis, clubbing, or edema bilateral lower extremities Skin: Negative rashes, lesions, ulcers Psychiatric:  Negative depression, positive anxiety, negative fatigue, negative mania  Central nervous system:  Cranial nerves II through XII intact, tongue/uvula midline, all extremities muscle strength 5/5, sensation intact throughout, negative dysarthria, negative expressive aphasia, negative receptive aphasia.  .     Data Reviewed:  Care during the described time interval was provided by me .  I have reviewed this patient's available data, including medical history, events of note, physical examination, and all test results as part of my evaluation.   CBC: Recent Labs  Lab 02/10/2019 1147 02/28/19 0229 03/01/19 0333  WBC 8.9 8.3 13.2*  NEUTROABS 7.5 7.5  --   HGB 13.4 13.6 14.2  HCT 41.3 42.6 44.8  MCV 94.7 96.4 95.1  PLT 257 272 468   Basic Metabolic Panel: Recent Labs  Lab 02/21/2019 1147 02/28/19 0229 03/01/19 0333 03/01/19 1220  NA 136 137 141  --   K 3.9 4.2 4.1  --   CL 102 104 105  --   CO2 '24 23 24  ' --   GLUCOSE 251* 245* 241*  --   BUN 24* 22 31*  --   CREATININE 0.88 0.89 0.92  --   CALCIUM 8.5* 8.4* 9.1  --   MG  --   --   --  2.2   GFR: Estimated Creatinine Clearance: 96.8 mL/min (by  C-G formula based on SCr of 0.92 mg/dL). Liver Function Tests: Recent Labs  Lab 02/16/2019 1147 03/01/19 0333  AST 40 42*  ALT 48* 63*  ALKPHOS 72 88  BILITOT 1.0 0.8  PROT 6.7 7.2  ALBUMIN 3.3* 3.2*   No results for input(s): LIPASE, AMYLASE in the last 168 hours. No results for input(s): AMMONIA in the last 168 hours. Coagulation Profile: No results for input(s): INR, PROTIME in the last 168 hours. Cardiac Enzymes: No results for input(s): CKTOTAL, CKMB, CKMBINDEX, TROPONINI in the last 168 hours. BNP (last 3 results) No results for input(s): PROBNP in the last 8760 hours. HbA1C: Recent Labs    02/28/19 0229  HGBA1C 7.0*   CBG: Recent Labs  Lab 02/28/19 1241 02/28/19 1618 03/01/19 0740 03/01/19 1235 03/01/19 1702  GLUCAP 217* 292* 221* 238* 230*   Lipid Profile: Recent Labs    02/08/2019 1147  TRIG 97   Thyroid Function Tests: No results for input(s): TSH, T4TOTAL, FREET4, T3FREE, THYROIDAB in the last 72 hours. Anemia Panel: Recent Labs    02/28/19 0757 03/01/19 0333  FERRITIN 763* 837*   Urine analysis:    Component Value Date/Time   COLORURINE YELLOW 02/26/2019 1915    APPEARANCEUR CLEAR 02/08/2019 1915   LABSPEC >1.046 (H) 03/02/2019 1915   PHURINE 6.0 02/03/2019 1915   GLUCOSEU NEGATIVE 02/22/2019 1915   HGBUR NEGATIVE 02/21/2019 1915   [place] NEGATIVE 02/28/2019 [place] NEGATIVE 02/24/2019 1915   PROTEINUR NEGATIVE 02/24/2019 1915   NITRITE NEGATIVE 02/07/2019 1915   LEUKOCYTESUR NEGATIVE 02/19/2019 1915   Sepsis Labs: '@LABRCNTIP' (procalcitonin:4,lacticidven:4)  ) Recent Results (from the past 240 hour(s))  Blood culture (routine x 2)     Status: None (Preliminary result)   Collection Time: 02/26/2019 11:48 AM   Specimen: BLOOD  Result Value Ref Range Status   Specimen Description   Final    BLOOD RIGHT ANTECUBITAL Performed at [facility], [place] [address] [place], [place] [address]    Special Requests   Final    BOTTLES DRAWN AEROBIC AND ANAEROBIC Blood Culture results may not be optimal due to an excessive volume of blood received in culture bottles Performed at [place] [address], [place], [place] [address]    Culture   Final    NO GROWTH 2 DAYS Performed at [place] [address] [place], [place] [address]    Report Status PENDING  Incomplete  Blood culture (routine x 2)     Status: None (Preliminary result)   Collection Time: 02/19/2019 11:50 AM   Specimen: BLOOD RIGHT HAND  Result Value Ref Range Status   Specimen Description   Final    BLOOD RIGHT HAND Performed at [place] [address] [place], [place] [address]    Special Requests   Final    BOTTLES DRAWN AEROBIC AND ANAEROBIC Blood Culture results may not be optimal due to an excessive volume of blood received in culture bottles Performed at [place] [address] [place], [place] [address]    Culture   Final    NO GROWTH 2 DAYS Performed at [facility] [place] [address] [place], [place] [address]    Report Status PENDING  Incomplete  SARS  CORONAVIRUS 2 (TAT 6-24 HRS) Nasopharyngeal Nasopharyngeal Swab     Status: Abnormal   Collection Time: 02/07/2019  2:00 PM   Specimen: Nasopharyngeal Swab  Result Value Ref Range Status  SARS Coronavirus 2 POSITIVE (A) NEGATIVE Final    Comment: RESULT CALLED TO, READ BACK BY AND VERIFIED WITH: [facility] 02/13/2019 MCCORMICK K (NOTE) SARS-CoV-2 target nucleic acids are DETECTED. The SARS-CoV-2 RNA is generally detectable in upper and lower respiratory specimens during the acute phase of infection. Positive results are indicative of the presence of SARS-CoV-2 RNA. Clinical correlation with patient history and other diagnostic information is  necessary to determine patient infection status. Positive results do not rule out bacterial infection or co-infection with other viruses.  The expected result is Negative. Fact Sheet for Patients: SugarRoll.be Fact Sheet for Healthcare Providers: https://www.-mathews.com/ This test is not yet approved or cleared by the [place] FDA and  has been authorized for detection and/or diagnosis of SARS-CoV-2 by FDA under an Emergency Use Authorization (EUA). This EUA will remain  in effect (meaning this test can be used) for  the duration of the COVID-19 declaration under Section 564(b)(1) of the Act, 21 U.S.C. section 360bbb-3(b)(1), unless the authorization is terminated or revoked sooner. Performed at [facility], [place] [address] [place], [place] [address]   Culture, sputum-assessment     Status: None   Collection Time: 02/01/2019  4:04 PM   Specimen: Sputum  Result Value Ref Range Status   Specimen Description SPUTUM  Final   Special Requests NONE  Final   Sputum evaluation   Final    Sputum specimen not acceptable for testing.  Please recollect.   RESULT CALLED TO, READ BACK BY AND VERIFIED WITH: I [facility] 02/28/19 2116 RHOLMES Performed at [facility], [place]  [address], [place], [place] [address]    Report Status 02/28/2019 FINAL  Final  Respiratory Panel by PCR     Status: None   Collection Time: 02/17/2019  6:45 PM   Specimen: Flu Kit Nasopharyngeal Swab; Respiratory  Result Value Ref Range Status   Adenovirus NOT DETECTED NOT DETECTED Final   Coronavirus 229E NOT DETECTED NOT DETECTED Final    Comment: (NOTE) The Coronavirus on the Respiratory Panel, DOES NOT test for the novel  Coronavirus (2019 nCoV)    Coronavirus HKU1 NOT DETECTED NOT DETECTED Final   Coronavirus NL63 NOT DETECTED NOT DETECTED Final   Coronavirus OC43 NOT DETECTED NOT DETECTED Final   Metapneumovirus NOT DETECTED NOT DETECTED Final   Rhinovirus / Enterovirus NOT DETECTED NOT DETECTED Final   Influenza A NOT DETECTED NOT DETECTED Final   Influenza B NOT DETECTED NOT DETECTED Final   Parainfluenza Virus 1 NOT DETECTED NOT DETECTED Final   Parainfluenza Virus 2 NOT DETECTED NOT DETECTED Final   Parainfluenza Virus 3 NOT DETECTED NOT DETECTED Final   Parainfluenza Virus 4 NOT DETECTED NOT DETECTED Final   Respiratory Syncytial Virus NOT DETECTED NOT DETECTED Final   Bordetella pertussis NOT DETECTED NOT DETECTED Final   Chlamydophila pneumoniae NOT DETECTED NOT DETECTED Final   Mycoplasma pneumoniae NOT DETECTED NOT DETECTED Final    Comment: Performed at [facility], [place]. [address], [place], [place] [address]  MRSA PCR Screening     Status: None   Collection Time: 02/17/2019  6:45 PM   Specimen: Nasal Mucosa; Nasopharyngeal  Result Value Ref Range Status   MRSA by PCR NEGATIVE NEGATIVE Final    Comment:        The GeneXpert MRSA Assay (FDA approved for NASAL specimens only), is one component of a comprehensive MRSA colonization surveillance program. It is not intended to diagnose MRSA infection nor to  guide or monitor treatment for MRSA infections. Performed at [facility], [place] [address] [place], [place] [address]   Urine culture      Status: None   Collection Time: 02/14/2019  7:15 PM   Specimen: Urine, Random  Result Value Ref Range Status   Specimen Description   Final    URINE, RANDOM Performed at [place] [address], [place], [place] [address]    Special Requests   Final    NONE Performed at [facility], [place] [address], [place], [place] [address]    Culture   Final    NO GROWTH Performed at [facility], [place] [address] [place], [place] [address]    Report Status 03/01/2019 FINAL  Final         Radiology Studies: No results found.      Scheduled Meds: . aspirin EC  81 mg Oral Daily  . Chlorhexidine Gluconate Cloth  6 each Topical Daily  . enoxaparin (LOVENOX) injection  60 mg Subcutaneous BID  . fluticasone  2 spray Each Nare Daily  . guaiFENesin  1,200 mg Oral BID  . insulin aspart  0-20 Units Subcutaneous TID WC  . insulin detemir  10 Units Subcutaneous QHS  . irbesartan  75 mg Oral Daily  . latanoprost  1 drop Both Eyes QHS  . loratadine  10 mg Oral Daily  . mouth rinse  15 mL Mouth Rinse BID  . methylPREDNISolone (SOLU-MEDROL) injection  60 mg Intravenous Q12H  . metoprolol tartrate  5 mg Intravenous Q6H  . omega-3 acid ethyl esters  1 g Oral Daily  . pantoprazole  40 mg Oral Daily  . rosuvastatin  10 mg Oral Daily  . timolol  1 drop Left Eye BID  . umeclidinium-vilanterol  1 puff Inhalation Daily   Continuous Infusions: . ceFEPime (MAXIPIME) IV 2 g (03/01/19 1733)  . remdesivir 100 mg in NS 250 mL Stopped (03/01/19 1008)     LOS: 2 days   The patient is critically ill with multiple organ systems failure and requires high complexity decision making for assessment and support, frequent evaluation and titration of therapies, application of advanced monitoring technologies and extensive interpretation of multiple databases. Critical Care Time devoted to patient care services described in this note  Time spent: 40 minutes      Marsha Hillman, Geraldo Docker, MD [facility] Pager [phone]  If 7PM-7AM, please contact night-coverage [email or web address] Password [facility] 03/01/2019, 6:49 PM

## 2019-03-01 NOTE — Progress Notes (Signed)
I spoke with patients wife and daughter [name] for  30 minutes answering questions and discussing plan of care. Pt does not want to be a DNR.  Dr. Sherral Hammers made aware.    pts daughter sharon brought patient some change of underclothes. I gave her patients home medications , a pair of auto keys and  $10. Verified contents with patient before sending them home.   Pt has underclothes, pair of tenniis shoes,a cell phone and charger, a pair of glasses, a pair of hearing aids, a pair of pants and a shirt at bedside. [place] Sia

## 2019-03-01 NOTE — Progress Notes (Signed)
Mr. Arlis Porta is a 78 year old gentleman with a recent diagnosis of hypersensitivity pneumonitis following open lung biopsy who has been treated with steroids prior to admission who presented to [facility] long ED with complaints of shortness of breath on 11/27.  At that time he was found to be hypoxic in the 80s.  He has had escalating oxygen requirements requiring heated high flow nasal cannula and nonrebreather.  He was transferred today to [facility].  His appetite has been poor.  He remains on heated high flow and nonrebreather mask with saturations in the low 90s.  He is able to speak in full sentences, is not using accessory muscles, and appears relaxed sitting in bed.  Continue steroids and remdesivir Continue supplemental oxygen as required to control work of breathing.  Agree with periodic proning. Continue management per primary.  Julian Hy, DO 03/01/19 5:21 PM [facility]

## 2019-03-01 NOTE — Progress Notes (Signed)
Received pt from [facility] on H HFNC and NRB. Pt stable  With no complications. Pt denies SOB at rest, SOB with exertion, no increased WOB

## 2019-03-02 ENCOUNTER — Other Ambulatory Visit: Payer: Self-pay | Admitting: *Deleted

## 2019-03-02 ENCOUNTER — Ambulatory Visit: Payer: Self-pay | Admitting: *Deleted

## 2019-03-02 DIAGNOSIS — J1289 Other viral pneumonia: Secondary | ICD-10-CM

## 2019-03-02 DIAGNOSIS — U071 COVID-19: Principal | ICD-10-CM

## 2019-03-02 DIAGNOSIS — J431 Panlobular emphysema: Secondary | ICD-10-CM

## 2019-03-02 DIAGNOSIS — J189 Pneumonia, unspecified organism: Secondary | ICD-10-CM | POA: Diagnosis not present

## 2019-03-02 DIAGNOSIS — J9601 Acute respiratory failure with hypoxia: Secondary | ICD-10-CM | POA: Diagnosis not present

## 2019-03-02 DIAGNOSIS — J8410 Pulmonary fibrosis, unspecified: Secondary | ICD-10-CM | POA: Diagnosis present

## 2019-03-02 DIAGNOSIS — J679 Hypersensitivity pneumonitis due to unspecified organic dust: Secondary | ICD-10-CM | POA: Diagnosis present

## 2019-03-02 LAB — CBC WITH DIFFERENTIAL/PLATELET
Abs Immature Granulocytes: 0.07 10*3/uL (ref 0.00–0.07)
Basophils Absolute: 0 10*3/uL (ref 0.0–0.1)
Basophils Relative: 0 %
Eosinophils Absolute: 0 10*3/uL (ref 0.0–0.5)
Eosinophils Relative: 0 %
HCT: 43.6 % (ref 39.0–52.0)
Hemoglobin: 14 g/dL (ref 13.0–17.0)
Immature Granulocytes: 1 %
Lymphocytes Relative: 4 %
Lymphs Abs: 0.4 10*3/uL — ABNORMAL LOW (ref 0.7–4.0)
MCH: 30.4 pg (ref 26.0–34.0)
MCHC: 32.1 g/dL (ref 30.0–36.0)
MCV: 94.6 fL (ref 80.0–100.0)
Monocytes Absolute: 0.5 10*3/uL (ref 0.1–1.0)
Monocytes Relative: 4 %
Neutro Abs: 9.7 10*3/uL — ABNORMAL HIGH (ref 1.7–7.7)
Neutrophils Relative %: 91 %
Platelets: 363 10*3/uL (ref 150–400)
RBC: 4.61 MIL/uL (ref 4.22–5.81)
RDW: 13.4 % (ref 11.5–15.5)
WBC: 10.6 10*3/uL — ABNORMAL HIGH (ref 4.0–10.5)
nRBC: 0 % (ref 0.0–0.2)

## 2019-03-02 LAB — POCT I-STAT 7, (LYTES, BLD GAS, ICA,H+H)
Acid-base deficit: 1 mmol/L (ref 0.0–2.0)
Bicarbonate: 22.8 mmol/L (ref 20.0–28.0)
Calcium, Ion: 1.22 mmol/L (ref 1.15–1.40)
HCT: 41 % (ref 39.0–52.0)
Hemoglobin: 13.9 g/dL (ref 13.0–17.0)
O2 Saturation: 86 %
Patient temperature: 98.6
Potassium: 4.4 mmol/L (ref 3.5–5.1)
Sodium: 141 mmol/L (ref 135–145)
TCO2: 24 mmol/L (ref 22–32)
pCO2 arterial: 35.3 mmHg (ref 32.0–48.0)
pH, Arterial: 7.419 (ref 7.350–7.450)
pO2, Arterial: 50 mmHg — ABNORMAL LOW (ref 83.0–108.0)

## 2019-03-02 LAB — COMPREHENSIVE METABOLIC PANEL
ALT: 51 U/L — ABNORMAL HIGH (ref 0–44)
AST: 30 U/L (ref 15–41)
Albumin: 3.1 g/dL — ABNORMAL LOW (ref 3.5–5.0)
Alkaline Phosphatase: 93 U/L (ref 38–126)
Anion gap: 11 (ref 5–15)
BUN: 37 mg/dL — ABNORMAL HIGH (ref 8–23)
CO2: 25 mmol/L (ref 22–32)
Calcium: 8.9 mg/dL (ref 8.9–10.3)
Chloride: 106 mmol/L (ref 98–111)
Creatinine, Ser: 0.92 mg/dL (ref 0.61–1.24)
GFR calc Af Amer: >60 mL/min (ref >60)
GFR calc non Af Amer: >60 mL/min (ref >60)
Glucose, Bld: 281 mg/dL — ABNORMAL HIGH (ref 70–99)
Potassium: 4.8 mmol/L (ref 3.5–5.1)
Sodium: 142 mmol/L (ref 135–145)
Total Bilirubin: 1 mg/dL (ref 0.3–1.2)
Total Protein: 6.5 g/dL (ref 6.5–8.1)

## 2019-03-02 LAB — D-DIMER, QUANTITATIVE: D-Dimer, Quant: 1.35 ug/mL-FEU — ABNORMAL HIGH (ref 0.00–0.50)

## 2019-03-02 LAB — TYPE AND SCREEN
ABO/RH(D): AB NEG
Antibody Screen: NEGATIVE

## 2019-03-02 LAB — FERRITIN: Ferritin: 600 ng/mL — ABNORMAL HIGH (ref 24–336)

## 2019-03-02 LAB — GLUCOSE, CAPILLARY
Glucose-Capillary: 224 mg/dL — ABNORMAL HIGH (ref 70–99)
Glucose-Capillary: 242 mg/dL — ABNORMAL HIGH (ref 70–99)
Glucose-Capillary: 260 mg/dL — ABNORMAL HIGH (ref 70–99)

## 2019-03-02 LAB — MAGNESIUM: Magnesium: 2.6 mg/dL — ABNORMAL HIGH (ref 1.7–2.4)

## 2019-03-02 LAB — PROCALCITONIN: Procalcitonin: <0.1 ng/mL

## 2019-03-02 LAB — C-REACTIVE PROTEIN: CRP: 6 mg/dL — ABNORMAL HIGH (ref <1.0)

## 2019-03-02 LAB — LACTATE DEHYDROGENASE: LDH: 467 U/L — ABNORMAL HIGH (ref 98–192)

## 2019-03-02 LAB — PHOSPHORUS: Phosphorus: 4 mg/dL (ref 2.5–4.6)

## 2019-03-02 LAB — ABO/RH: ABO/RH(D): AB NEG

## 2019-03-02 MED ORDER — INSULIN DETEMIR 100 UNIT/ML ~~LOC~~ SOLN
16.0000 [IU] | Freq: Every day | SUBCUTANEOUS | Status: DC
  Administered 2019-03-02: 16 [IU] via SUBCUTANEOUS
  Filled 2019-03-02 (×2): qty 0.16

## 2019-03-02 MED ORDER — SODIUM CHLORIDE 0.9% IV SOLUTION
Freq: Once | INTRAVENOUS | Status: AC
  Administered 2019-03-02: 09:00:00 via INTRAVENOUS

## 2019-03-02 MED ORDER — INSULIN ASPART 100 UNIT/ML ~~LOC~~ SOLN
4.0000 [IU] | Freq: Three times a day (TID) | SUBCUTANEOUS | Status: DC
  Administered 2019-03-02 – 2019-03-03 (×3): 4 [IU] via SUBCUTANEOUS

## 2019-03-02 NOTE — Progress Notes (Signed)
CCMD at bedside and RT at bedside. Plan of care discussed w/ him .

## 2019-03-02 NOTE — Progress Notes (Addendum)
Inpatient Diabetes Program Recommendations  AACE/ADA: New Consensus Statement on Inpatient Glycemic Control (2015)  Target Ranges:  Prepandial:   less than 140 mg/dL      Peak postprandial:   less than 180 mg/dL (1-2 hours)      Critically ill patients:  140 - 180 mg/dL   Lab Results  Component Value Date   GLUCAP 242 (H) 03/02/2019   HGBA1C 7.0 (H) 02/28/2019    Review of Glycemic Control Results for JOFFREY, RAJAN (MRN [id number]) as of 03/02/2019 16:47  Ref. Range 03/01/2019 12:35 03/01/2019 17:02 03/01/2019 21:35 03/02/2019 08:18 03/02/2019 11:37  Glucose-Capillary Latest Ref Range: 70 - 99 mg/dL 238 (H) 230 (H) 259 (H) 224 (H) 242 (H)   Diabetes history: DM 2 Outpatient Diabetes medications:  Metformin XR 500 mg bid, Prednisone 40 mg daily with breakfast Current orders for Inpatient glycemic control:  Novolog resistant tid with meals Novolog 4 units tid with meals Levemir 16 units q HS Solumedrol 60 mg IV q 12 hours Inpatient Diabetes Program Recommendations:    Agree with insulin changes today. Will follow.   Thanks,  Adah Perl, RN, BC-ADM Inpatient Diabetes Coordinator Pager [phone] (8a-5p)

## 2019-03-02 NOTE — Progress Notes (Signed)
NAME:  Justin Atkinson, Justin Atkinson MRN:  [id number], DOB:  12/15/1940, LOS: 3 ADMISSION DATE:  02/05/2019, CONSULTATION DATE:  11/27 REFERRING MD:  11/28, CHIEF COMPLAINT:  dyspnea   Brief History   78 y/o male with chronic hypersensitivity pneumonitis admitted on 11/27 with acute on chronic respiratory failure with hypoxemia due to COVID 19 pneumonia.  Past Medical History  Chronic hypersensitivity pneumonitis by 02/09/2019 open lung biopsy Centrilobular emphysema DM2 HNT HLD DJD Bladder cancer Solitary kidney  Significant Hospital Events   11/27 admission 11/29 move to [place], overnight severe hypoxemia, dyspnea confusion, temporarily placed on BIPAP, made DNR  Consults:  PCCM  Procedures:    Significant Diagnostic Tests:  CTA 11/27 interval development of bilateral diffuse groundglass opacities compared to 6/22 CT chest  Micro Data:  11/27 RVP- negative 11/27 SARS-Covid-2- Positive 11/27 blood cultures-pending 11/27 sputum cultures-pending  Antimicrobials/COVID Rx  Vanco 11/27> Cefepime 11/27>   remdesivir 11/28 >  Solumedrol 60 q12 11/28 > Actemra 11/29 x1  Interim history/subjective:  overnight severe hypoxemia, dyspnea confusion, temporarily placed on BIPAP   Objective   Blood pressure (!) 160/88, pulse 67, temperature 98.5 F (36.9 C), temperature source Axillary, resp. rate (!) 27, height 6\' 1"  (1.854 m), weight 130.7 kg, SpO2 92 %.    Vent Mode: Other (Comment) FiO2 (%):  [100 %] 100 %   Intake/Output Summary (Last 24 hours) at 03/02/2019 0701 Last data filed at 03/01/2019 2100 Gross per 24 hour  Intake 710 ml  Output 1500 ml  Net -790 ml   Filed Weights   02/06/2019 1810 02/28/19 0812  Weight: 130.7 kg 130.7 kg    Examination:  General:  Resting comfortably in bed HENT: NCAT OP clear PULM: Crackles bases B, normal effort CV: RRR, no mgr GI: BS+, soft, nontender MSK: normal bulk and tone Neuro: awake, alert, [facility] Problem  list    Assessment & Plan:  Acute hypoxemic respiratory failure due to COVID 19 pneumonia: worsening hypoxemia overnight; given his baseline interstitial lung disease and progressive nature of COVID 61 and his age, I advised he would not do well with intubation, mechanical ventilation > change code status to DNR > continue heated high flow O2 in ICU > no BIPAP > Tolerate periods of hypoxemia, goal at rest is greater than 85% SaO2, with movement ideally above 75% > Out of bed to chair as able > Incentive spirometry is important, use every hour > Prone positioning while in bed> if he is able    Hypersensivitivy pneumonitis Continue solumedrol as per COVID Rx above When appropriate, decrease steroid dose to 20mg  prednisone daily  Best practice:   Code Status: DNR Family Communication: I spoke with his wife [name] and daughter [name], informed them he wants to be DNR Disposition: remain in ICU, full scope of practice short of CPR, mechanical ventialtion, ACLS drugs in event of cardiac arrest  Labs   CBC: Recent Labs  Lab 02/05/2019 1147 02/28/19 0229 03/01/19 0333 03/02/19 0649  WBC 8.9 8.3 13.2*  --   NEUTROABS 7.5 7.5  --   --   HGB 13.4 13.6 14.2 13.9  HCT 41.3 42.6 44.8 41.0  MCV 94.7 96.4 95.1  --   PLT 257 272 362  --     Basic Metabolic Panel: Recent Labs  Lab 02/24/2019 1147 02/28/19 0229 03/01/19 0333 03/01/19 1220 03/02/19 0649  NA 136 137 141  --  141  K 3.9 4.2 4.1  --  4.4  CL 102 104 105  --   --   CO2 24 23 24   --   --   GLUCOSE 251* 245* 241*  --   --   BUN 24* 22 31*  --   --   CREATININE 0.88 0.89 0.92  --   --   CALCIUM 8.5* 8.4* 9.1  --   --   MG  --   --   --  2.2  --    GFR: Estimated Creatinine Clearance: 96.8 mL/min (by C-G formula based on SCr of 0.92 mg/dL). Recent Labs  Lab 02/07/2019 1147 02/20/2019 1400 02/28/19 0229 02/28/19 0757 03/01/19 0333  PROCALCITON 0.13  --   --  <0.10 <0.10  WBC 8.9  --  8.3  --  13.2*  LATICACIDVEN 2.3*  1.4  --   --   --     Liver Function Tests: Recent Labs  Lab 02/10/2019 1147 03/01/19 0333  AST 40 42*  ALT 48* 63*  ALKPHOS 72 88  BILITOT 1.0 0.8  PROT 6.7 7.2  ALBUMIN 3.3* 3.2*   No results for input(s): LIPASE, AMYLASE in the last 168 hours. No results for input(s): AMMONIA in the last 168 hours.  ABG    Component Value Date/Time   PHART 7.419 03/02/2019 0649   PCO2ART 35.3 03/02/2019 0649   PO2ART 50.0 (L) 03/02/2019 0649   HCO3 22.8 03/02/2019 0649   TCO2 24 03/02/2019 0649   ACIDBASEDEF 1.0 03/02/2019 0649   O2SAT 86.0 03/02/2019 0649     Coagulation Profile: No results for input(s): INR, PROTIME in the last 168 hours.  Cardiac Enzymes: No results for input(s): CKTOTAL, CKMB, CKMBINDEX, TROPONINI in the last 168 hours.  HbA1C: Hgb A1c MFr Bld  Date/Time Value Ref Range Status  02/28/2019 02:29 AM 7.0 (H) 4.8 - 5.6 % Final    Comment:    (NOTE) Pre diabetes:          5.7%-6.4% Diabetes:              >6.4% Glycemic control for   <7.0% adults with diabetes     CBG: Recent Labs  Lab 02/28/19 1618 03/01/19 0740 03/01/19 1235 03/01/19 1702 03/01/19 2135  GLUCAP 292* 221* 238* 230* 259*     Critical care time: 35 minutes      Roselie Awkward, MD [facility] Pager: [phone] Cell: [phone] If no response, call [phone]

## 2019-03-02 NOTE — Evaluation (Signed)
Occupational Therapy Evaluation Patient Details Name: Justin Atkinson. MRN: [id number] DOB: 12/01/1940 Today's Date: 03/02/2019    History of Present Illness 78 y.o. male with medical history significant of COPD/pulmonary fibrosis was on home O2 however subsequently discontinued over the past few weeks as hypoxia had improved, recent bronchoscopy, right thoracoscopy, wedge resection of right lung February 09 2019, cervical degenerative disc disease, hypertension, diabetes, hyperlipidemia who presents to the ED with a 10-day history of increasing productive cough, shortness of breath, chills. Tested COVID +.    Clinical Impression   PTA, pt was living with his wife and was independent; pt caring for his wife as she has MS and has PCA as well for BADLs and transfers. Pt currently requiring Mod A for UB ADLs and Max A for LB ADLs at bed level. Pt attempting to sit at EOB for use of urinal but SpO2 quickly dropped into 60s on 50L O2 via HFNC and NRB. Pt requiring significant time and cues for calm, purse lip breathing to stabilize SpO2 to high 80s. Repositioned in bed with Max and RN to optimize position for breathing and use of urinal which pt was able to use of supervision in bed. Pt would benefit from further acute OT to facilitate safe dc and increase activity tolerance during ADLs. Pending pt progress as eval was limited, recommend dc to SNF for further OT optimizing return to PLOF.     Follow Up Recommendations  SNF;Supervision/Assistance - 24 hour(Pending progress)    Equipment Recommendations  3 in 1 bedside commode    Recommendations for Other Services       Precautions / Restrictions Precautions Precautions: Fall(Watch vitals. Desats quickly)      Mobility Bed Mobility Overal bed mobility: Needs Assistance Bed Mobility: Rolling Rolling: Min assist         General bed mobility comments: Min A for rolling to position pt on right side. Max A for sliding pt up in the bed for  use of urinal  Transfers                 General transfer comment: Defered due to vital    Balance                                           ADL either performed or assessed with clinical judgement   ADL Overall ADL's : Needs assistance/impaired Eating/Feeding: Set up;Bed level   Grooming: Set up;Bed level   Upper Body Bathing: Moderate assistance;Bed level   Lower Body Bathing: Maximal assistance;Bed level   Upper Body Dressing : Moderate assistance;Bed level   Lower Body Dressing: Maximal assistance;Bed level       Toileting- Clothing Manipulation and Hygiene: Set up;Bed level Toileting - Clothing Manipulation Details (indicate cue type and reason): Pt using urinal at bed level in supine. Noting SpO2 dropping into 70s with movement and use of urinal.      Functional mobility during ADLs: Maximal assistance;+2 for physical assistance(repositioning in bed) General ADL Comments: Pt presenting with significant deficits in activity tolerance. Pt attempting to sit at EOB to use urinal but was unable to transition to EOB due to SpO2 dropping quickly to 60s on 50L O2 via HFNC and NRB. Providing cues for calm, purse lip breathing and assisted to reposition bed in bed with RN. Pt requiring significant time and purse lip breathing to return to  high 80s on 50L O2.      Vision         Perception     Praxis      Pertinent Vitals/Pain Pain Assessment: Faces Faces Pain Scale: Hurts a little bit Pain Location: Generalized Pain Descriptors / Indicators: Discomfort Pain Intervention(s): Monitored during session;Repositioned     Hand Dominance     Extremity/Trunk Assessment Upper Extremity Assessment Upper Extremity Assessment: Generalized weakness   Lower Extremity Assessment Lower Extremity Assessment: Defer to PT evaluation       Communication Communication Communication: No difficulties   Cognition Arousal/Alertness: Awake/alert Behavior  During Therapy: Anxious Overall Cognitive Status: Difficult to assess                                 General Comments: Pt appears to be near baseline cognition providing home information and following commands. However, pt quickly anxious with difficulty breathing.    General Comments  50L via HFNC and NRB.    Exercises     Shoulder Instructions      Home Living Family/patient expects to be discharged to:: Private residence Living Arrangements: Spouse/significant other Available Help at Discharge: Family;Personal care attendant;Available PRN/intermittently Type of Home: House Home Access: Level entry     Home Layout: Two level;Able to live on main level with bedroom/bathroom(Goes upstairs to sleep in spare bedroom since wife stays up ) Alternate Level Stairs-Number of Steps: 16 Alternate Level Stairs-Rails: Left Bathroom Shower/Tub: Occupational psychologist: Handicapped height     Home Equipment: None(Wife has equipment)   Additional Comments: Wife has MS and has PCA to assist her with BADLs and transfers      Prior Functioning/Environment Level of Independence: Independent                 OT Problem List: Decreased strength;Decreased range of motion;Decreased activity tolerance;Impaired balance (sitting and/or standing);Decreased knowledge of use of DME or AE;Decreased knowledge of precautions;Pain      OT Treatment/Interventions: Self-care/ADL training;Therapeutic exercise;Energy conservation;DME and/or AE instruction;Therapeutic activities;Patient/family education    OT Goals(Current goals can be found in the care plan section) Acute Rehab OT Goals Patient Stated Goal: "Go home to my wife" OT Goal Formulation: With patient Time For Goal Achievement: 03/02/19 Potential to Achieve Goals: Good  OT Frequency: Min 2X/week   Barriers to D/C: Other (comment)(Wife has MS and pt is her caregiver)          Co-evaluation               AM-PAC OT "6 Clicks" Daily Activity     Outcome Measure Help from another person eating meals?: None Help from another person taking care of personal grooming?: A Little Help from another person toileting, which includes using toliet, bedpan, or urinal?: A Lot Help from another person bathing (including washing, rinsing, drying)?: A Lot Help from another person to put on and taking off regular upper body clothing?: A Lot Help from another person to put on and taking off regular lower body clothing?: A Lot 6 Click Score: 15   End of Session Equipment Utilized During Treatment: Oxygen(50L) Nurse Communication: Mobility status;Other (comment)(Vitals)  Activity Tolerance: Patient limited by fatigue;Other (comment)(Impacted by anxiety) Patient left: in bed;with call bell/phone within reach;with nursing/sitter in room  OT Visit Diagnosis: Unsteadiness on feet (R26.81);Other abnormalities of gait and mobility (R26.89);Muscle weakness (generalized) (M62.81);Other symptoms and signs involving cognitive function;Pain Pain - part  of body: (Back)                Time: 1310-1330 OT Time Calculation (min): 20 min Charges:  OT General Charges $OT Visit: 1 Visit OT Evaluation $OT Eval High Complexity: 1 High  Jared Cahn MSOT, OTR/L Acute Rehab Pager: [phone] Office: [place] 03/02/2019, 2:32 PM

## 2019-03-02 NOTE — Progress Notes (Signed)
Pt tried to get up to use the urinal, and his oxygen came off. His became very hypoxic (SpO2 dropped to 28) amd his breathing was very labored. RT and 3 RNs at bedside. Pt placed on biPAP after his sats remained in the 59s. Tolerated biPAP for a couple hours, and tried to switch him back to the HFNC + NRB, but he didn't tolerate this and was placed back on the biPap. AM ABG shows pH 7.41, pCO2 35.3, pO2 50, and bicarb 22.Marland Kitchen

## 2019-03-02 NOTE — Progress Notes (Addendum)
PROGRESS NOTE    Justin Atkinson.  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 Vaughan Atkinson) who was recently started on steroid on 11/18, HTN, diabetes type 2 uncontrolled with complication, HLD, and DJD   Presented with increasing shortness of breath with nasal congestion, cough for about 10 days. Also noticed that his oxygen dropped to 89% on room air. Called the pulmonary office who asked him to come to the ED. In the ED he is hypoxicat80%, tachypneic and mildly hypertensive. CT angiogram of the chest was negative for PE and showed bilateral diffuse groundglass opacity. Admitted to ICU on high flow started on IV Solu-Medrol. COVID-19 test returned as positive. In the ED he had elevated inflammatory markers including LDH, ferritin, CRP, lactic acid and D-dimer. Patient remains on high flow oxygen. 03/01/2019: Remains 100% high flow nasal cannula oxygen.  Transferring to [place] to continue treatment.   Subjective: 11/30 last 24 hours afebrile.  Overnight patient became hypoxic when he got out of bed took off his O2.  Per RN Justin Atkinson's note he became very hypoxic (SpO2 dropped to 28) amd his breathing was very labored. RT and 3 RNs at bedside. Pt placed on biPAP after his sats remained in the 67s. Tolerated biPAP for a couple hours, and tried to switch him back to the HFNC + NRB, but he didn't tolerate this and was placed back on the biPap. AM ABG shows pH 7.41, pCO2 35.3, pO2 50, and bicarb 22.Marland Kitchen  NOTE review of Dr. Vaughan Atkinson PCCM note 02/18/2019 patient was sent home on 2 L O2, pulmonary fibrosis, RIGHT VATS/11/9 performed showed hypersensitivity pneumonitis    Assessment & Plan:   Principal Problem:   Acute respiratory failure with hypoxia ([place]) Active Problems:   Hypertension  Hypercholesterolemia   Benign prostate hyperplasia   ILD (interstitial lung disease) ([place])   Postinflammatory pulmonary fibrosis (HCC)   HCAP (healthcare-associated pneumonia)   Chronic obstructive pulmonary disease ([place])   Pneumonia due to COVID-19 virus   Severe sepsis ([place])   Essential hypertension   Diabetes mellitus type 2, controlled, with complications ([place])   Pulmonary fibrosis (HCC)   Hypersensitivity pneumonitis ([place])   Covid pneumonia/acute respiratory failure with hypoxia COVID-19 Labs  Recent Labs    02/28/19 0757 03/01/19 0333 03/02/19 0455  DDIMER 1.02* 1.17* 1.35*  FERRITIN 763* 837* 600*  LDH  --   --  467*  CRP 15.5* 11.0* 6.0*    Lab Results  Component Value Date   SARSCOV2NAA POSITIVE (A) 02/03/2019   SARSCOV2NAA NOT DETECTED 02/05/2019   SARSCOV2NAA NOT DETECTED 01/09/2019   -Solu-Medrol 60 mg BID -Remdesivir per pharmacy protocol -11/29 Actemra x1 dose -Combivent -Flutter valve -Incentive spirometry -Titrate O2 to maintain SPO2> 88% -Prone patient 16 hours/day; if patient cannot tolerate prone at least 2 to 3 hours per shift -Ativan PRN -Morphine PRN   HCAP/severe sepsis -Trend procalcitonin Results for Justin Atkinson (MRN [id number]) as of 03/01/2019 18:22  Ref. Range 02/01/2019 11:47 02/28/2019 07:57 03/01/2019 03:33  Procalcitonin Latest Units: ng/mL 0.13 <0.10 <0.10  -Continue cefepime x7 days  Pulmonary fibrosis/Hypersensitivity Pneumonitis -Per Dr. Vaughan Atkinson PCCM note 11/9 worsening pulmonary fibrosis s  -11/9 RIGHT VATS; per Dr. Vaughan Atkinson PCCM note showed hypersensitivity pneumonitis  -See Covid pneumonia  Hypertension -Irbesartan 75 mg daily -Metoprolol IV 5 mg QID  -Hydralazine IV PRN  HLD -Crestor 10 mg daily  Diabetes type 2 controlled with complication -16/10 hemoglobin A1c= 7.0 -Resistant SSI -11/30 increase Levemir 16 units qhs -11/30 NovoLog 4 units qac    DVT prophylaxis: Lovenox  Code Status: Full Family  Communication: 11/30 myself and Justin Atkinson spoke with daughter and wife counseled him on plan of care answered all questions.  Justin Atkinson his wife request that we contact Justin Atkinson (daughter) as [place] [phone] Disposition Plan: TBD   Consultants:    Procedures/Significant Events:     I have personally reviewed and interpreted all radiology studies and my findings are as above.  VENTILATOR SETTINGS: HFNC 11/30 Flow rate; 50 L/min FiO2; 100% SPO2; 89%   Cultures     Antimicrobials: Anti-infectives (From admission, onward)   Start     Stop   03/01/19 0800  remdesivir 100 mg in sodium chloride 0.9 % 250 mL IVPB     03/05/19 0759   02/28/19 1000  remdesivir 200 mg in sodium chloride 0.9 % 250 mL IVPB     02/28/19 1006   02/28/19 0000  vancomycin (VANCOCIN) 1,250 mg in sodium chloride 0.9 % 250 mL IVPB  Status:  Discontinued     02/07/2019 2037   02/13/2019 2200  ceFEPIme (MAXIPIME) 2 g in sodium chloride 0.9 % 100 mL IVPB         02/24/2019 1315  ceFEPIme (MAXIPIME) 2 g in sodium chloride 0.9 % 100 mL IVPB     02/28/19 0324   03/01/2019 1315  vancomycin (VANCOCIN) 2,000 mg in sodium chloride 0.9 % 500 mL IVPB     02/28/19 0324       Devices    LINES / TUBES:      Continuous Infusions: . ceFEPime (MAXIPIME) IV 2 g (03/02/19 0855)  . remdesivir 100 mg in NS 250 mL 100 mg (03/02/19 0908)     Objective: Vitals:   03/02/19 1205 03/02/19 1241 03/02/19 1300 03/02/19 1330  BP: (!) 174/93  (!) 153/85   Pulse: 83 61 62 98  Resp: (!) 29 (!) 30 (!) 27 19  Temp:      TempSrc:      SpO2: 100% 91% 90% (!) 84%  Weight:      Height:        Intake/Output Summary (Last 24 hours) at 03/02/2019 1535 Last data filed at 03/02/2019 1242 Gross per 24 hour  Intake 1231.95 ml  Output 900 ml  Net 331.95 ml   Filed Weights   02/01/2019 1810 02/28/19 0812  Weight: 130.7 kg 130.7 kg   Physical Exam:  General: A/O x4, positive acute respiratory distress Eyes: negative scleral  hemorrhage, negative anisocoria, negative icterus ENT: Negative Runny nose, negative gingival bleeding, Neck:  Negative scars, masses, torticollis, lymphadenopathy, JVD Lungs: Diffuse decreased breath sounds, positive left side expiratory wheeze, negative  crackles Cardiovascular: Regular rate and rhythm without murmur gallop or rub normal S1 and S2 Abdomen: negative abdominal pain, nondistended, positive soft, bowel sounds, no rebound, no ascites, no appreciable mass Extremities: No significant cyanosis, clubbing, or edema bilateral lower extremities Skin: Negative rashes, lesions, ulcers Psychiatric:  Negative depression, negative anxiety, negative fatigue, negative mania  Central nervous system:  Cranial nerves II through XII intact, tongue/uvula midline, all extremities muscle strength 5/5, sensation intact throughout, negative dysarthria, negative expressive aphasia, negative receptive aphasia.  .     Data Reviewed: Care during the described time interval was provided by me .  I have reviewed this patient's available data, including medical history,  events of note, physical examination, and all test results as part of my evaluation.   CBC: Recent Labs  Lab 02/05/2019 1147 02/28/19 0229 03/01/19 0333 03/02/19 0455 03/02/19 0649  WBC 8.9 8.3 13.2* 10.6*  --   NEUTROABS 7.5 7.5  --  9.7*  --   HGB 13.4 13.6 14.2 14.0 13.9  HCT 41.3 42.6 44.8 43.6 41.0  MCV 94.7 96.4 95.1 94.6  --   PLT 257 272 362 363  --    Basic Metabolic Panel: Recent Labs  Lab 02/04/2019 1147 02/28/19 0229 03/01/19 0333 03/01/19 1220 03/02/19 0455 03/02/19 0649  NA 136 137 141  --  142 141  K 3.9 4.2 4.1  --  4.8 4.4  CL 102 104 105  --  106  --   CO2 '24 23 24  ' --  25  --   GLUCOSE 251* 245* 241*  --  281*  --   BUN 24* 22 31*  --  37*  --   CREATININE 0.88 0.89 0.92  --  0.92  --   CALCIUM 8.5* 8.4* 9.1  --  8.9  --   MG  --   --   --  2.2 2.6*  --   PHOS  --   --   --   --  4.0  --    GFR:  Estimated Creatinine Clearance: 96.8 mL/min (by C-G formula based on SCr of 0.92 mg/dL). Liver Function Tests: Recent Labs  Lab 02/06/2019 1147 03/01/19 0333 03/02/19 0455  AST 40 42* 30  ALT 48* 63* 51*  ALKPHOS 72 88 93  BILITOT 1.0 0.8 1.0  PROT 6.7 7.2 6.5  ALBUMIN 3.3* 3.2* 3.1*   No results for input(s): LIPASE, AMYLASE in the last 168 hours. No results for input(s): AMMONIA in the last 168 hours. Coagulation Profile: No results for input(s): INR, PROTIME in the last 168 hours. Cardiac Enzymes: No results for input(s): CKTOTAL, CKMB, CKMBINDEX, TROPONINI in the last 168 hours. BNP (last 3 results) No results for input(s): PROBNP in the last 8760 hours. HbA1C: Recent Labs    02/28/19 0229  HGBA1C 7.0*   CBG: Recent Labs  Lab 03/01/19 1235 03/01/19 1702 03/01/19 2135 03/02/19 0818 03/02/19 1137  GLUCAP 238* 230* 259* 224* 242*   Lipid Profile: No results for input(s): CHOL, HDL, LDLCALC, TRIG, CHOLHDL, LDLDIRECT in the last 72 hours. Thyroid Function Tests: No results for input(s): TSH, T4TOTAL, FREET4, T3FREE, THYROIDAB in the last 72 hours. Anemia Panel: Recent Labs    03/01/19 0333 03/02/19 0455  FERRITIN 837* 600*   Urine analysis:    Component Value Date/Time   COLORURINE YELLOW 03/02/2019 1915   APPEARANCEUR CLEAR 02/28/2019 1915   LABSPEC >1.046 (H) 02/06/2019 1915   PHURINE 6.0 02/16/2019 1915   GLUCOSEU NEGATIVE 03/02/2019 1915   HGBUR NEGATIVE 02/02/2019 1915   [place] NEGATIVE 02/20/2019 [place] NEGATIVE 02/05/2019 1915   PROTEINUR NEGATIVE 02/20/2019 1915   NITRITE NEGATIVE 02/01/2019 1915   LEUKOCYTESUR NEGATIVE 02/11/2019 1915   Sepsis Labs: '@LABRCNTIP' (procalcitonin:4,lacticidven:4)  ) Recent Results (from the past 240 hour(s))  Blood culture (routine x 2)     Status: None (Preliminary result)   Collection Time: 02/02/2019 11:48 AM   Specimen: BLOOD  Result Value Ref Range Status   Specimen Description   Final     BLOOD RIGHT ANTECUBITAL Performed at [facility], [place] [address], [place], [place] [address]    Special Requests   Final  BOTTLES DRAWN AEROBIC AND ANAEROBIC Blood Culture results may not be optimal due to an excessive volume of blood received in culture bottles Performed at [facility], [place] [address], [place], [place] [address]    Culture   Final    NO GROWTH 3 DAYS Performed at [facility], [place] [address], [place], [place] [address]    Report Status PENDING  Incomplete  Blood culture (routine x 2)     Status: None (Preliminary result)   Collection Time: 02/07/2019 11:50 AM   Specimen: BLOOD RIGHT HAND  Result Value Ref Range Status   Specimen Description   Final    BLOOD RIGHT HAND Performed at [facility] [address], [place], [place] [address]    Special Requests   Final    BOTTLES DRAWN AEROBIC AND ANAEROBIC Blood Culture results may not be optimal due to an excessive volume of blood received in culture bottles Performed at [place] [address], [place], [place] [address]    Culture   Final    NO GROWTH 3 DAYS Performed at [facility], [place] [address] [place], [place] [address]    Report Status PENDING  Incomplete  SARS CORONAVIRUS 2 (TAT 6-24 HRS) Nasopharyngeal Nasopharyngeal Swab     Status: Abnormal   Collection Time: 02/04/2019  2:00 PM   Specimen: Nasopharyngeal Swab  Result Value Ref Range Status   SARS Coronavirus 2 POSITIVE (A) NEGATIVE Final    Comment: RESULT CALLED TO, READ BACK BY AND VERIFIED WITH: K.GARCIA RN 1950 02/17/2019 M[place]ORMICK K (NOTE) SARS-CoV-2 target nucleic acids are DETECTED. The SARS-CoV-2 RNA is generally detectable in upper and lower respiratory specimens during the acute phase of infection. Positive results are indicative of the presence of SARS-CoV-2 RNA. Clinical correlation with patient history and other diagnostic information is   necessary to determine patient infection status. Positive results do not rule out bacterial infection or co-infection with other viruses.  The expected result is Negative. Fact Sheet for Patients: SugarRoll.be Fact Sheet for Healthcare Providers: https://www.-mathews.com/ This test is not yet approved or cleared by the [place] FDA and  has been authorized for detection and/or diagnosis of SARS-CoV-2 by FDA under an Emergency Use Authorization (EUA). This EUA will remain  in effect (meaning this test can be used) for  the duration of the COVID-19 declaration under Section 564(b)(1) of the Act, 21 U.S.C. section 360bbb-3(b)(1), unless the authorization is terminated or revoked sooner. Performed at [facility], [place] [address], [place], [place] [address]   Culture, sputum-assessment     Status: None   Collection Time: 02/11/2019  4:04 PM   Specimen: Sputum  Result Value Ref Range Status   Specimen Description SPUTUM  Final   Special Requests NONE  Final   Sputum evaluation   Final    Sputum specimen not acceptable for testing.  Please recollect.   RESULT CALLED TO, READ BACK BY AND VERIFIED WITH: I [facility] 02/28/19 2116 RHOLMES Performed at [facility], [place] [address], [place], [place] [address]    Report Status 02/28/2019 FINAL  Final  Respiratory Panel by PCR     Status: None   Collection Time: 02/28/2019  6:45 PM   Specimen: Flu Kit Nasopharyngeal Swab; Respiratory  Result Value Ref Range Status   Adenovirus NOT DETECTED NOT DETECTED Final   Coronavirus 229E NOT DETECTED NOT DETECTED Final    Comment: (NOTE) The Coronavirus on the  Respiratory Panel, DOES NOT test for the novel  Coronavirus (2019 nCoV)    Coronavirus HKU1 NOT DETECTED NOT DETECTED Final   Coronavirus NL63 NOT DETECTED NOT DETECTED Final   Coronavirus OC43 NOT DETECTED NOT DETECTED Final   Metapneumovirus NOT DETECTED NOT DETECTED  Final   Rhinovirus / Enterovirus NOT DETECTED NOT DETECTED Final   Influenza A NOT DETECTED NOT DETECTED Final   Influenza B NOT DETECTED NOT DETECTED Final   Parainfluenza Virus 1 NOT DETECTED NOT DETECTED Final   Parainfluenza Virus 2 NOT DETECTED NOT DETECTED Final   Parainfluenza Virus 3 NOT DETECTED NOT DETECTED Final   Parainfluenza Virus 4 NOT DETECTED NOT DETECTED Final   Respiratory Syncytial Virus NOT DETECTED NOT DETECTED Final   Bordetella pertussis NOT DETECTED NOT DETECTED Final   Chlamydophila pneumoniae NOT DETECTED NOT DETECTED Final   Mycoplasma pneumoniae NOT DETECTED NOT DETECTED Final    Comment: Performed at [facility], [place] [address] [place], [place] [address]  MRSA PCR Screening     Status: None   Collection Time: 02/21/2019  6:45 PM   Specimen: Nasal Mucosa; Nasopharyngeal  Result Value Ref Range Status   MRSA by PCR NEGATIVE NEGATIVE Final    Comment:        The GeneXpert MRSA Assay (FDA approved for NASAL specimens only), is one component of a comprehensive MRSA colonization surveillance program. It is not intended to diagnose MRSA infection nor to guide or monitor treatment for MRSA infections. Performed at [facility], [place] [address] [place], [place] [address]   Urine culture     Status: None   Collection Time: 02/23/2019  7:15 PM   Specimen: Urine, Random  Result Value Ref Range Status   Specimen Description   Final    URINE, RANDOM Performed at [place] [address] [place], [place] [address]    Special Requests   Final    NONE Performed at [facility], [place] [address], [place], [place] [address]    Culture   Final    NO GROWTH Performed at [facility], [place] [address] [place],  [address]    Report Status 03/01/2019 FINAL  Final         Radiology Studies: No results found.      Scheduled Meds: . aspirin EC  81 mg Oral Daily  . Chlorhexidine  Gluconate Cloth  6 each Topical Daily  . enoxaparin (LOVENOX) injection  60 mg Subcutaneous BID  . fluticasone  2 spray Each Nare Daily  . guaiFENesin  1,200 mg Oral BID  . insulin aspart  0-20 Units Subcutaneous TID WC  . insulin aspart  4 Units Subcutaneous TID WC  . insulin detemir  16 Units Subcutaneous QHS  . irbesartan  75 mg Oral Daily  . latanoprost  1 drop Both Eyes QHS  . loratadine  10 mg Oral Daily  . mouth rinse  15 mL Mouth Rinse BID  . methylPREDNISolone (SOLU-MEDROL) injection  60 mg Intravenous Q12H  . metoprolol tartrate  5 mg Intravenous Q6H  . omega-3 acid ethyl esters  1 g Oral Daily  . pantoprazole  40 mg Oral Daily  . rosuvastatin  10 mg Oral Daily  . timolol  1 drop Left Eye BID  . umeclidinium-vilanterol  1 puff Inhalation Daily   Continuous Infusions: . ceFEPime (MAXIPIME) IV 2 g (03/02/19 0855)  . remdesivir 100 mg in NS 250 mL 100 mg (03/02/19  5726)     LOS: 3 days   The patient is critically ill with multiple organ systems failure and requires high complexity decision making for assessment and support, frequent evaluation and titration of therapies, application of advanced monitoring technologies and extensive interpretation of multiple databases. Critical Care Time devoted to patient care services described in this note  Time spent: 40 minutes     , Geraldo Docker, MD [facility] Pager [phone]  If 7PM-7AM, please contact night-coverage [email or web address] Password [email or web address] 03/02/2019, 3:35 PM

## 2019-03-02 NOTE — Patient Outreach (Signed)
[facility]) Care Management  03/02/2019  Justin Atkinson. 05/25/1940 [id number]   Subjective: Received CHL/ Epic notification message, patient currently inpatient at [facility], 02/25/2019 to present, for acute respiratory failure with hypoxia, concern for possible healthcare associated pneumonia versus atypical pneumonia with concerns for possible Covid infection versus flare of pulmonary fibrosis versus hypersensitivity pneumonitis .   No patient outreach call at this time due to hospitalization, and will refer patient to [facility] for hospital discharge planning/ disposition, and referral follow up.     Objective:Per KPN (Knowledge Performance Now, point of care tool) and chart review,patient hospitalized 02/09/2019 -02/11/2019 forPostinflammatory pulmonary fibrosis,ILD (interstitial lung disease), status postBronchoscopy,RightVideo assisted thoracoscopy,Right upper, middle, and lower lobe wedge resection,Intercostal nerve blockon 02/09/2019. Patient also has a history of renal cyst, Infected sebaceous cyst of skin,Malignant neoplasm of lateral wall of urinary bladder,Inguinal hernia unilateral,Benign prostate hyperplasia,Tobacco abuse,Hypertension, andHypercholesterolemia.      Assessment: Received Medicare [facility] Liaison referral on 02/12/2019. Referral source: Dannielle Huh. Referral reason: complex care managementrelated torespiratory status from pulmonary fibrosis status postVATS and hx.COPD, new to oxygen use at home; and to assess further needs post discharge, diagnosis COPD. Screening follow up not completed due hospitalization.  Will refer patient to [facility] for hospital discharge planning/ disposition, and referral follow up.     Plan:RNCM will send in-basket message update, will refer patient  to [place]  (Natividad Brood, Charlott Rakes) for hospital discharge planning/ disposition, and referral follow up.      Justin Atkinson, BSN, [place] Management [facility] Telephonic CM Phone: [phone] Fax: [phone]

## 2019-03-02 NOTE — Evaluation (Signed)
PT Cancellation Note  Patient Details Name: Justin Atkinson. MRN: [id number] DOB: [birth year]   Cancelled Treatment:    Reason Eval/Treat Not Completed: Medical issues which prohibited therapy, OT saw  Limited eval, patient desats very quickly. Will check back tomorrow.   Claretha Cooper 03/02/2019, 2:27 PM  [place]  Office [phone]

## 2019-03-02 NOTE — Progress Notes (Signed)
Spoke to pt's daughter Justin Atkinson and provided an update. Convalescent plasma was delivered and is currently infusing. No negative reaction.

## 2019-03-02 NOTE — Progress Notes (Signed)
Dr Sherral Hammers at bedside /consent received and signed for Convalescent Plasma. Patient educated and MD answered all questions.

## 2019-03-02 NOTE — Consult Note (Signed)
   [facility] CM Inpatient Consult   03/02/2019  Iyaad Edsell 1940-08-08 [id number]     [facility] Pending Status:   Alerted by [facility] RN CM coordinator of patient's readmission to [facility]).  Per [facility] RN CM note, screening follow-up was not completed due hospitalization (2 attempts made- not successful). Patient is being followed by [facility] RN CM for complex care managementrelated torespiratory status (pulmonary fibrosis status postVATS and COPD management).  Patient reviewed for 30 day readmission, with 21% risk score for unplanned readmission and hospitalizations.  Chart review and MD note dated 03/02/19 reveal that: This 78 y/o male with chronic hypersensitivity pneumonitis was admitted on 03/01/2019 with acute on chronic respiratory failure with hypoxemia due to COVID 19 pneumonia. Code Status: DNR. Patient remains in ICU.    Will continue to follow along for disposition plan/ needs, and update [facility] RN CM once discharge disposition has been determined.  Of note, [facility] Care Management services does not replace or interfere with any services that are arranged by transition of care case management or social work.    For questions and additional information, please call:  Marzelle Rutten A. Fleur Audino, BSN, RN-BC [facility] Liaison Cell: [phone]

## 2019-03-02 NOTE — Progress Notes (Signed)
Patient attempted to get out of bed to use the restroom. Patient's oxygen came off and patients Sp02=28%,patient labored and dysphoretic. Patient orientated to name and birth date.Placed patient back on his oxygen set at 100% NRB with 40LPM and 100% heated high flow. Patients Sp02 level remained 62-70%. Placed patient on bipap with IPAP at 17 and EPAP set at 12cm and 100%. Patients Sp02 increased to 90-93%. Will transition patient back to NRB with 40L and 100% heated high flow.

## 2019-03-03 DIAGNOSIS — J679 Hypersensitivity pneumonitis due to unspecified organic dust: Secondary | ICD-10-CM

## 2019-03-03 DIAGNOSIS — E118 Type 2 diabetes mellitus with unspecified complications: Secondary | ICD-10-CM

## 2019-03-03 DIAGNOSIS — J189 Pneumonia, unspecified organism: Secondary | ICD-10-CM | POA: Diagnosis not present

## 2019-03-03 DIAGNOSIS — J9601 Acute respiratory failure with hypoxia: Secondary | ICD-10-CM | POA: Diagnosis not present

## 2019-03-03 DIAGNOSIS — J431 Panlobular emphysema: Secondary | ICD-10-CM | POA: Diagnosis not present

## 2019-03-03 LAB — D-DIMER, QUANTITATIVE: D-Dimer, Quant: 1.92 ug/mL-FEU — ABNORMAL HIGH (ref 0.00–0.50)

## 2019-03-03 LAB — COMPREHENSIVE METABOLIC PANEL
ALT: 55 U/L — ABNORMAL HIGH (ref 0–44)
AST: 34 U/L (ref 15–41)
Albumin: 3.2 g/dL — ABNORMAL LOW (ref 3.5–5.0)
Alkaline Phosphatase: 105 U/L (ref 38–126)
Anion gap: 12 (ref 5–15)
BUN: 35 mg/dL — ABNORMAL HIGH (ref 8–23)
CO2: 24 mmol/L (ref 22–32)
Calcium: 9.1 mg/dL (ref 8.9–10.3)
Chloride: 106 mmol/L (ref 98–111)
Creatinine, Ser: 0.84 mg/dL (ref 0.61–1.24)
GFR calc Af Amer: >60 mL/min (ref >60)
GFR calc non Af Amer: >60 mL/min (ref >60)
Glucose, Bld: 213 mg/dL — ABNORMAL HIGH (ref 70–99)
Potassium: 4.7 mmol/L (ref 3.5–5.1)
Sodium: 142 mmol/L (ref 135–145)
Total Bilirubin: 1 mg/dL (ref 0.3–1.2)
Total Protein: 6.9 g/dL (ref 6.5–8.1)

## 2019-03-03 LAB — GLUCOSE, CAPILLARY
Glucose-Capillary: 256 mg/dL — ABNORMAL HIGH (ref 70–99)
Glucose-Capillary: 227 mg/dL — ABNORMAL HIGH (ref 70–99)
Glucose-Capillary: 290 mg/dL — ABNORMAL HIGH (ref 70–99)
Glucose-Capillary: 265 mg/dL — ABNORMAL HIGH (ref 70–99)
Glucose-Capillary: 211 mg/dL — ABNORMAL HIGH (ref 70–99)

## 2019-03-03 LAB — CBC WITH DIFFERENTIAL/PLATELET
Abs Immature Granulocytes: 0.1 10*3/uL — ABNORMAL HIGH (ref 0.00–0.07)
Basophils Absolute: 0 10*3/uL (ref 0.0–0.1)
Basophils Relative: 0 %
Eosinophils Absolute: 0 10*3/uL (ref 0.0–0.5)
Eosinophils Relative: 0 %
HCT: 44.9 % (ref 39.0–52.0)
Hemoglobin: 14.5 g/dL (ref 13.0–17.0)
Immature Granulocytes: 1 %
Lymphocytes Relative: 4 %
Lymphs Abs: 0.5 10*3/uL — ABNORMAL LOW (ref 0.7–4.0)
MCH: 30.6 pg (ref 26.0–34.0)
MCHC: 32.3 g/dL (ref 30.0–36.0)
MCV: 94.7 fL (ref 80.0–100.0)
Monocytes Absolute: 0.3 10*3/uL (ref 0.1–1.0)
Monocytes Relative: 3 %
Neutro Abs: 11.1 10*3/uL — ABNORMAL HIGH (ref 1.7–7.7)
Neutrophils Relative %: 92 %
Platelets: 361 10*3/uL (ref 150–400)
RBC: 4.74 MIL/uL (ref 4.22–5.81)
RDW: 13.3 % (ref 11.5–15.5)
WBC: 12.1 10*3/uL — ABNORMAL HIGH (ref 4.0–10.5)
nRBC: 0 % (ref 0.0–0.2)

## 2019-03-03 LAB — C-REACTIVE PROTEIN: CRP: 3.2 mg/dL — ABNORMAL HIGH (ref <1.0)

## 2019-03-03 LAB — LACTATE DEHYDROGENASE: LDH: 574 U/L — ABNORMAL HIGH (ref 98–192)

## 2019-03-03 LAB — PREPARE FRESH FROZEN PLASMA

## 2019-03-03 LAB — PHOSPHORUS: Phosphorus: 3.4 mg/dL (ref 2.5–4.6)

## 2019-03-03 LAB — PROCALCITONIN: Procalcitonin: <0.1 ng/mL

## 2019-03-03 LAB — BPAM FFP
Blood Product Expiration Date: 202012011349
ISSUE DATE / TIME: 202011301856
Unit Type and Rh: 8400

## 2019-03-03 LAB — MAGNESIUM: Magnesium: 2.4 mg/dL (ref 1.7–2.4)

## 2019-03-03 LAB — FERRITIN: Ferritin: 484 ng/mL — ABNORMAL HIGH (ref 24–336)

## 2019-03-03 MED ORDER — HYDRALAZINE HCL 10 MG PO TABS
10.0000 mg | ORAL_TABLET | Freq: Four times a day (QID) | ORAL | Status: DC
  Filled 2019-03-03 (×8): qty 1

## 2019-03-03 MED ORDER — INSULIN ASPART 100 UNIT/ML ~~LOC~~ SOLN
6.0000 [IU] | Freq: Three times a day (TID) | SUBCUTANEOUS | Status: DC
  Administered 2019-03-03 – 2019-03-16 (×36): 6 [IU] via SUBCUTANEOUS

## 2019-03-03 MED ORDER — HALOPERIDOL LACTATE 5 MG/ML IJ SOLN
5.0000 mg | Freq: Four times a day (QID) | INTRAMUSCULAR | Status: DC | PRN
  Administered 2019-03-03 – 2019-03-04 (×4): 5 mg via INTRAMUSCULAR
  Filled 2019-03-03 (×4): qty 1

## 2019-03-03 MED ORDER — INSULIN DETEMIR 100 UNIT/ML ~~LOC~~ SOLN
18.0000 [IU] | Freq: Every day | SUBCUTANEOUS | Status: DC
  Administered 2019-03-03 – 2019-03-08 (×6): 18 [IU] via SUBCUTANEOUS
  Filled 2019-03-03 (×6): qty 0.18

## 2019-03-03 MED ORDER — TRAZODONE HCL 50 MG PO TABS
25.0000 mg | ORAL_TABLET | Freq: Every day | ORAL | Status: DC
  Administered 2019-03-04 – 2019-03-10 (×7): 25 mg via ORAL
  Filled 2019-03-03 (×7): qty 1

## 2019-03-03 MED ORDER — HYDRALAZINE HCL 20 MG/ML IJ SOLN
5.0000 mg | Freq: Four times a day (QID) | INTRAMUSCULAR | Status: DC
  Administered 2019-03-03: 09:00:00 5 mg via INTRAVENOUS
  Filled 2019-03-03 (×2): qty 1

## 2019-03-03 MED ORDER — DEXMEDETOMIDINE HCL IN NACL 400 MCG/100ML IV SOLN
0.4000 ug/kg/h | INTRAVENOUS | Status: DC
  Administered 2019-03-03: 0.4 ug/kg/h via INTRAVENOUS
  Administered 2019-03-04: 19:00:00 0.1 ug/kg/h via INTRAVENOUS
  Administered 2019-03-04: 0.3 ug/kg/h via INTRAVENOUS
  Filled 2019-03-03 (×2): qty 100

## 2019-03-03 NOTE — Progress Notes (Signed)
NAME:  Justin Atkinson, Justin Atkinson MRN:  [id number], DOB:  May 18, 1940, LOS: 4 ADMISSION DATE:  03/01/2019, CONSULTATION DATE:  11/27 REFERRING MD:  11/28, CHIEF COMPLAINT:  dyspnea   Brief History   78 y/o male with chronic hypersensitivity pneumonitis admitted on 11/27 with acute on chronic respiratory failure with hypoxemia due to COVID 19 pneumonia.  Past Medical History  Chronic hypersensitivity pneumonitis by 02/09/2019 open lung biopsy Centrilobular emphysema DM2 HNT HLD DJD Bladder cancer Solitary kidney  Significant Hospital Events   11/27 admission 11/29 move to [place], overnight severe hypoxemia, dyspnea confusion, temporarily placed on BIPAP, made DNR  Consults:  PCCM  Procedures:    Significant Diagnostic Tests:  CTA 11/27 interval development of bilateral diffuse groundglass opacities compared to 6/22 CT chest  Micro Data:  11/27 RVP- negative 11/27 SARS-Covid-2- Positive 11/27 blood cultures-pending 11/27 sputum cultures-pending  Antimicrobials/COVID Rx  Vanco 11/27> Cefepime 11/27>   remdesivir 11/28 >  Solumedrol 60 q12 11/28 > Actemra 11/29 x1  Interim history/subjective:   No significant change overnight Some confusion Didn't sleep much   Objective   Blood pressure (!) 156/75, pulse 61, temperature 98.3 F (36.8 C), temperature source Oral, resp. rate (!) 29, height 6\' 1"  (1.854 m), weight 130.7 kg, SpO2 91 %.    FiO2 (%):  [100 %] 100 %   Intake/Output Summary (Last 24 hours) at 03/03/2019 0804 Last data filed at 03/03/2019 0700 Gross per 24 hour  Intake 1534.11 ml  Output 1425 ml  Net 109.11 ml   Filed Weights   02/17/2019 1810 02/28/19 0812  Weight: 130.7 kg 130.7 kg    Examination:  General:  Resting comfortably in bed HENT: NCAT OP clear PULM: Crackles bases B, increased work of breathing but speaking in complete sentences CV: RRR, no mgr GI: BS+, soft, nontender MSK: normal bulk and tone Neuro: awake, alert, no distress, [facility] Problem list    Assessment & Plan:  Acute hypoxemic respiratory failure due to COVID 19 pneumonia: worsening hypoxemia overnight 11/30; given his baseline interstitial lung disease and progressive nature of COVID 78 and his age, I advised he would not do well with intubation, mechanical ventilation DNR Continue heated high flow in ICU Tolerate periods of hypoxemia, goal at rest is greater than 85% SaO2, with movement ideally above 75% Decision for intubation should be based on a change in mental status or physical evidence of ventilatory failure such as nasal flaring, accessory muscle use, paradoxical breathing Out of bed to chair as able Incentive spirometry is important, use every hour Prone positioning while in bed  Hypersensivitivy pneumonitis Continue solumedrol per covid protocol Prednisone 20mg  daily at discharge until seen in pulmonary clinic   Best practice:   Code Status: DNR Family Communication:per TRH Disposition: remain in ICU, full scope of practice short of CPR, mechanical ventialtion, ACLS drugs in event of cardiac arrest  Labs   CBC: Recent Labs  Lab 02/12/2019 1147 02/28/19 0229 03/01/19 0333 03/02/19 0455 03/02/19 0649 03/03/19 0450  WBC 8.9 8.3 13.2* 10.6*  --  12.1*  NEUTROABS 7.5 7.5  --  9.7*  --  11.1*  HGB 13.4 13.6 14.2 14.0 13.9 14.5  HCT 41.3 42.6 44.8 43.6 41.0 44.9  MCV 94.7 96.4 95.1 94.6  --  94.7  PLT 257 272 362 363  --  [id number]    Basic Metabolic Panel: Recent Labs  Lab 02/04/2019 1147 02/28/19 0229 03/01/19 0333 03/01/19 1220 03/02/19 0455 03/02/19 0649 03/03/19  0450  NA 136 137 141  --  142 141 142  K 3.9 4.2 4.1  --  4.8 4.4 4.7  CL 102 104 105  --  106  --  106  CO2 24 23 24   --  25  --  24  GLUCOSE 251* 245* 241*  --  281*  --  213*  BUN 24* 22 31*  --  37*  --  35*  CREATININE 0.88 0.89 0.92  --  0.92  --  0.84  CALCIUM 8.5* 8.4* 9.1  --  8.9  --  9.1  MG  --   --   --  2.2 2.6*  --  2.4  PHOS  --   --    --   --  4.0  --  3.4   GFR: Estimated Creatinine Clearance: 106 mL/min (by C-G formula based on SCr of 0.84 mg/dL). Recent Labs  Lab 02/03/2019 1147 02/03/2019 1400 02/28/19 0229 02/28/19 0757 03/01/19 0333 03/02/19 0455 03/03/19 0450  PROCALCITON 0.13  --   --  <0.10 <0.10 <0.10 <0.10  WBC 8.9  --  8.3  --  13.2* 10.6* 12.1*  LATICACIDVEN 2.3* 1.4  --   --   --   --   --     Liver Function Tests: Recent Labs  Lab 02/17/2019 1147 03/01/19 0333 03/02/19 0455 03/03/19 0450  AST 40 42* 30 34  ALT 48* 63* 51* 55*  ALKPHOS 72 88 93 105  BILITOT 1.0 0.8 1.0 1.0  PROT 6.7 7.2 6.5 6.9  ALBUMIN 3.3* 3.2* 3.1* 3.2*   No results for input(s): LIPASE, AMYLASE in the last 168 hours. No results for input(s): AMMONIA in the last 168 hours.  ABG    Component Value Date/Time   PHART 7.419 03/02/2019 0649   PCO2ART 35.3 03/02/2019 0649   PO2ART 50.0 (L) 03/02/2019 0649   HCO3 22.8 03/02/2019 0649   TCO2 24 03/02/2019 0649   ACIDBASEDEF 1.0 03/02/2019 0649   O2SAT 86.0 03/02/2019 0649     Coagulation Profile: No results for input(s): INR, PROTIME in the last 168 hours.  Cardiac Enzymes: No results for input(s): CKTOTAL, CKMB, CKMBINDEX, TROPONINI in the last 168 hours.  HbA1C: Hgb A1c MFr Bld  Date/Time Value Ref Range Status  02/28/2019 02:29 AM 7.0 (H) 4.8 - 5.6 % Final    Comment:    (NOTE) Pre diabetes:          5.7%-6.4% Diabetes:              >6.4% Glycemic control for   <7.0% adults with diabetes     CBG: Recent Labs  Lab 03/01/19 1702 03/01/19 2135 03/02/19 0818 03/02/19 1137 03/02/19 1716  GLUCAP 230* 259* 224* 242* 260*     Critical care time: 30 minutes     Justin Awkward, MD [place] [facility] Pager: [phone] Cell: [phone] If no response, call [phone]

## 2019-03-03 NOTE — Progress Notes (Addendum)
PROGRESS NOTE    Justin Atkinson.  [id number] DOB: 06-27-1940 DOA: 02/08/2019 PCP: Justin Infante, MD   Brief Narrative:  78 year old  WM PMHx COPD/pulmonary fibrosis recently diagnosed hypersensitivity pneumonitis (had VATS wedge resection on 11/9;follows with Dr. Vaughan Atkinson) who was recently started on steroid on 11/18, HTN, diabetes type 2 uncontrolled with complication, HLD, and DJD   Presented with increasing shortness of breath with nasal congestion, cough for about 10 days. Also noticed that his oxygen dropped to 89% on room air. Called the pulmonary office who asked him to come to the ED. In the ED he is hypoxicat80%, tachypneic and mildly hypertensive. CT angiogram of the chest was negative for PE and showed bilateral diffuse groundglass opacity. Admitted to ICU on high flow started on IV Solu-Medrol. COVID-19 test returned as positive. In the ED he had elevated inflammatory markers including LDH, ferritin, CRP, lactic acid and D-dimer. Patient remains on high flow oxygen. 03/01/2019: Remains 100% high flow nasal cannula oxygen.  Transferring to [place] to continue treatment.  NOTE review of Dr. Vaughan Atkinson PCCM note 02/18/2019 patient was sent home on 2 L O2, pulmonary fibrosis, RIGHT VATS/11/9 performed showed hypersensitivity pneumonitis   Subjective: 12/1 next 24 hours afebrile negative abdominal pain, negative N/V, positive S OB.  States did not sleep well.    Assessment & Plan:   Principal Problem:   Acute respiratory failure with hypoxia (HCC) Active Problems:   Hypertension   Hypercholesterolemia   Benign prostate hyperplasia   ILD (interstitial lung disease) (HCC)   Postinflammatory pulmonary fibrosis (HCC)   HCAP (healthcare-associated pneumonia)   Chronic obstructive pulmonary disease ([place])   Pneumonia due to COVID-19 virus   Severe sepsis ([place])   Essential hypertension   Diabetes mellitus type 2, controlled, with complications ([place])  Pulmonary fibrosis (HCC)   Hypersensitivity pneumonitis ([place])   Covid pneumonia/acute respiratory failure with hypoxia COVID-19 Labs  Recent Labs    03/01/19 0333 03/02/19 0455 03/03/19 0450  DDIMER 1.17* 1.35* 1.92*  FERRITIN 837* 600* 484*  LDH  --  467* 574*  CRP 11.0* 6.0* 3.2*    Lab Results  Component Value Date   SARSCOV2NAA POSITIVE (A) 02/03/2019   SARSCOV2NAA NOT DETECTED 02/05/2019   SARSCOV2NAA NOT DETECTED 01/09/2019   -Solu-Medrol 60 mg BID -Remdesivir per pharmacy protocol -11/29 Actemra x1 dose -11/30 Covid Convalescent Plasma x1  -Combivent -Flutter valve -Incentive spirometry -Titrate O2 to maintain SPO2> 88% -Prone patient 16 hours/day; if patient cannot tolerate prone at least 2 to 3 hours per shift -Ativan PRN -Morphine PRN   HCAP/severe sepsis -Trend procalcitonin Results for Justin Atkinson (MRN [id number]) as of 03/01/2019 18:22  Ref. Range 02/23/2019 11:47 02/28/2019 07:57 03/01/2019 03:33  Procalcitonin Latest Units: ng/mL 0.13 <0.10 <0.10  -Continue cefepime x7 days  Pulmonary fibrosis/Hypersensitivity Pneumonitis -Per Dr. Vaughan Atkinson PCCM note 11/9 worsening pulmonary fibrosis s  -11/9 RIGHT VATS; per Dr. Vaughan Atkinson PCCM note showed hypersensitivity pneumonitis  -See Covid pneumonia  Hypertension -Irbesartan 75 mg daily -Metoprolol IV 5 mg QID  -12/1 hydralazine PO 10 mg QID  HLD -Crestor 10 mg daily  Diabetes type 2 controlled with complication -29/51 hemoglobin A1c= 7.0 -Resistant SSI -12/1 increase Levemir 18 units qhs -12/1 increase NovoLog 6 units qac  Insomnia -Trazodone 25 mg qhs    DVT prophylaxis: Lovenox  Code Status: Full Family Communication: 12/1 spoke with daughter and wife counseled him on plan of care answered all questions.  [name] his wife request that we contact  [name] (daughter) as [place] [phone] Disposition Plan: TBD   Consultants:    Procedures/Significant Events:  11/30 Covid Convalescent  Plasma x1    I have personally reviewed and interpreted all radiology studies and my findings are as above.  VENTILATOR SETTINGS: HFNC + NRB 12/1 Flow rate; 40 L/min FiO2; 100% SPO2; 88%   Cultures 11/27 SARS coronavirus positive 11/29 influenza A/B negative   Antimicrobials: Anti-infectives (From admission, onward)   Start     Stop   03/01/19 0800  remdesivir 100 mg in sodium chloride 0.9 % 250 mL IVPB     03/05/19 0759   02/28/19 1000  remdesivir 200 mg in sodium chloride 0.9 % 250 mL IVPB     02/28/19 1006   02/28/19 0000  vancomycin (VANCOCIN) 1,250 mg in sodium chloride 0.9 % 250 mL IVPB  Status:  Discontinued     02/21/2019 2037   02/16/2019 2200  ceFEPIme (MAXIPIME) 2 g in sodium chloride 0.9 % 100 mL IVPB         02/28/2019 1315  ceFEPIme (MAXIPIME) 2 g in sodium chloride 0.9 % 100 mL IVPB     02/28/19 0324   02/24/2019 1315  vancomycin (VANCOCIN) 2,000 mg in sodium chloride 0.9 % 500 mL IVPB     02/28/19 0324       Devices    LINES / TUBES:      Continuous Infusions: . ceFEPime (MAXIPIME) IV 2 g (03/03/19 0053)  . remdesivir 100 mg in NS 250 mL Stopped (03/02/19 0938)     Objective: Vitals:   03/03/19 0500 03/03/19 0600 03/03/19 0700 03/03/19 0800  BP: (!) 160/78 (!) 156/75 (!) 171/82 (!) 183/82  Pulse: 63 61 (!) 57 75  Resp: (!) 28 (!) 29 (!) 26 (!) 32  Temp:      TempSrc:      SpO2: 93% 91% 93% (!) 88%  Weight:      Height:        Intake/Output Summary (Last 24 hours) at 03/03/2019 0817 Last data filed at 03/03/2019 0700 Gross per 24 hour  Intake 1534.11 ml  Output 1425 ml  Net 109.11 ml   Filed Weights   02/08/2019 1810 02/28/19 0812  Weight: 130.7 kg 130.7 kg   Physical Exam:  General: A/O x4, positive acute respiratory distress Eyes: negative scleral hemorrhage, negative anisocoria, negative icterus ENT: Negative Runny nose, negative gingival bleeding, Neck:  Negative scars, masses, torticollis, lymphadenopathy, JVD Lungs: Tachypneic  diffuse crackles without wheezes  Cardiovascular: Bradycardic without murmur gallop or rub normal S1 and S2 Abdomen: negative abdominal pain, nondistended, positive soft, bowel sounds, no rebound, no ascites, no appreciable mass Extremities: No significant cyanosis, clubbing, or edema bilateral lower extremities Skin: Negative rashes, lesions, ulcers Psychiatric:  Negative depression, negative anxiety, negative fatigue, negative mania  Central nervous system:  Cranial nerves II through XII intact, tongue/uvula midline, all extremities muscle strength 5/5, sensation intact throughout, negative dysarthria, negative expressive aphasia, negative receptive aphasia. .     Data Reviewed: Care during the described time interval was provided by me .  I have reviewed this patient's available data, including medical history, events of note, physical examination, and all test results as part of my evaluation.   CBC: Recent Labs  Lab 02/24/2019 1147 02/28/19 0229 03/01/19 0333 03/02/19 0455 03/02/19 0649 03/03/19 0450  WBC 8.9 8.3 13.2* 10.6*  --  12.1*  NEUTROABS 7.5 7.5  --  9.7*  --  11.1*  HGB 13.4 13.6 14.2 14.0 13.9  14.5  HCT 41.3 42.6 44.8 43.6 41.0 44.9  MCV 94.7 96.4 95.1 94.6  --  94.7  PLT 257 272 362 363  --  088   Basic Metabolic Panel: Recent Labs  Lab 02/24/2019 1147 02/28/19 0229 03/01/19 0333 03/01/19 1220 03/02/19 0455 03/02/19 0649 03/03/19 0450  NA 136 137 141  --  142 141 142  K 3.9 4.2 4.1  --  4.8 4.4 4.7  CL 102 104 105  --  106  --  106  CO2 _0 --  25  --  24  GLUCOSE 251* 245* 241*  --  281*  --  213*  BUN 24* 22 31*  --  37*  --  35*  CREATININE 0.88 0.89 0.92  --  0.92  --  0.84  CALCIUM 8.5* 8.4* 9.1  --  8.9  --  9.1  MG  --   --   --  2.2 2.6*  --  2.4  PHOS  --   --   --   --  4.0  --  3.4   GFR: Estimated Creatinine Clearance: 106 mL/min (by C-G formula based on SCr of 0.84 mg/dL). Liver Function Tests: Recent Labs  Lab 02/16/2019 1147  03/01/19 0333 03/02/19 0455 03/03/19 0450  AST 40 42* 30 34  ALT 48* 63* 51* 55*  ALKPHOS 72 88 93 105  BILITOT 1.0 0.8 1.0 1.0  PROT 6.7 7.2 6.5 6.9  ALBUMIN 3.3* 3.2* 3.1* 3.2*   No results for input(s): LIPASE, AMYLASE in the last 168 hours. No results for input(s): AMMONIA in the last 168 hours. Coagulation Profile: No results for input(s): INR, PROTIME in the last 168 hours. Cardiac Enzymes: No results for input(s): CKTOTAL, CKMB, CKMBINDEX, TROPONINI in the last 168 hours. BNP (last 3 results) No results for input(s): PROBNP in the last 8760 hours. HbA1C: No results for input(s): HGBA1C in the last 72 hours. CBG: Recent Labs  Lab 03/01/19 1702 03/01/19 2135 03/02/19 0818 03/02/19 1137 03/02/19 1716  GLUCAP 230* 259* 224* 242* 260*   Lipid Profile: No results for input(s): CHOL, HDL, LDLCALC, TRIG, CHOLHDL, LDLDIRECT in the last 72 hours. Thyroid Function Tests: No results for input(s): TSH, T4TOTAL, FREET4, T3FREE, THYROIDAB in the last 72 hours. Anemia Panel: Recent Labs    03/02/19 0455 03/03/19 0450  FERRITIN 600* 484*   Urine analysis:    Component Value Date/Time   COLORURINE YELLOW 02/19/2019 1915   APPEARANCEUR CLEAR 02/17/2019 1915   LABSPEC >1.046 (H) 02/08/2019 1915   PHURINE 6.0 02/15/2019 1915   GLUCOSEU NEGATIVE 02/13/2019 1915   HGBUR NEGATIVE 02/19/2019 1915   [place] NEGATIVE 02/09/2019 [place] NEGATIVE 02/08/2019 1915   PROTEINUR NEGATIVE 02/26/2019 1915   NITRITE NEGATIVE 02/05/2019 1915   LEUKOCYTESUR NEGATIVE 02/20/2019 1915   Sepsis Labs: _1 (procalcitonin:4,lacticidven:4)  ) Recent Results (from the past 240 hour(s))  Blood culture (routine x 2)     Status: None (Preliminary result)   Collection Time: 02/17/2019 11:48 AM   Specimen: BLOOD  Result Value Ref Range Status   Specimen Description   Final    BLOOD RIGHT ANTECUBITAL Performed at [facility], [place] [address], [place],  [place] [address]    Special Requests   Final    BOTTLES DRAWN AEROBIC AND ANAEROBIC Blood Culture results may not be optimal due to an excessive volume of blood received in culture bottles Performed at [place] [address] [place],  [place] [address]    Culture   Final    NO GROWTH 4 DAYS Performed at [facility], [place] [address], [place], [place] [address]    Report Status PENDING  Incomplete  Blood culture (routine x 2)     Status: None (Preliminary result)   Collection Time: 02/17/2019 11:50 AM   Specimen: BLOOD RIGHT HAND  Result Value Ref Range Status   Specimen Description   Final    BLOOD RIGHT HAND Performed at [facility] [place] [address], [place], [place] [address]    Special Requests   Final    BOTTLES DRAWN AEROBIC AND ANAEROBIC Blood Culture results may not be optimal due to an excessive volume of blood received in culture bottles Performed at [place] [address], [place], [place] [address]    Culture   Final    NO GROWTH 4 DAYS Performed at [facility], [place] [address] [place], [place] [address]    Report Status PENDING  Incomplete  SARS CORONAVIRUS 2 (TAT 6-24 HRS) Nasopharyngeal Nasopharyngeal Swab     Status: Abnormal   Collection Time: 02/12/2019  2:00 PM   Specimen: Nasopharyngeal Swab  Result Value Ref Range Status   SARS Coronavirus 2 POSITIVE (A) NEGATIVE Final    Comment: RESULT CALLED TO, READ BACK BY AND VERIFIED WITH: K.GARCIA RN 1950 02/03/2019 [place] K (NOTE) SARS-CoV-2 target nucleic acids are DETECTED. The SARS-CoV-2 RNA is generally detectable in upper and lower respiratory specimens during the acute phase of infection. Positive results are indicative of the presence of SARS-CoV-2 RNA. Clinical correlation with patient history and other diagnostic information is  necessary to determine patient infection status. Positive results do not rule out bacterial infection or  co-infection with other viruses.  The expected result is Negative. Fact Sheet for Patients: SugarRoll.be Fact Sheet for Healthcare Providers: https://www.-mathews.com/ This test is not yet approved or cleared by the [place] FDA and  has been authorized for detection and/or diagnosis of SARS-CoV-2 by FDA under an Emergency Use Authorization (EUA). This EUA will remain  in effect (meaning this test can be used) for  the duration of the COVID-19 declaration under Section 564(b)(1) of the Act, 21 U.S.C. section 360bbb-3(b)(1), unless the authorization is terminated or revoked sooner. Performed at [facility], [place] [address] [place], [place] [address]   Culture, sputum-assessment     Status: None   Collection Time: 02/23/2019  4:04 PM   Specimen: Sputum  Result Value Ref Range Status   Specimen Description SPUTUM  Final   Special Requests NONE  Final   Sputum evaluation   Final    Sputum specimen not acceptable for testing.  Please recollect.   RESULT CALLED TO, READ BACK BY AND VERIFIED WITH: I [facility] 02/28/19 2116 RHOLMES Performed at [facility], [place] [address], [place], [place] [address]    Report Status 02/28/2019 FINAL  Final  Respiratory Panel by PCR     Status: None   Collection Time: 02/11/2019  6:45 PM   Specimen: Flu Kit Nasopharyngeal Swab; Respiratory  Result Value Ref Range Status   Adenovirus NOT DETECTED NOT DETECTED Final   Coronavirus 229E NOT DETECTED NOT DETECTED Final    Comment: (NOTE) The Coronavirus on the Respiratory Panel, DOES NOT test for the novel  Coronavirus (2019 nCoV)    Coronavirus HKU1 NOT DETECTED NOT DETECTED Final   Coronavirus NL63 NOT DETECTED NOT DETECTED Final   Coronavirus  OC43 NOT DETECTED NOT DETECTED Final   Metapneumovirus NOT DETECTED NOT DETECTED Final   Rhinovirus / Enterovirus NOT DETECTED NOT DETECTED Final   Influenza A NOT DETECTED NOT DETECTED  Final   Influenza B NOT DETECTED NOT DETECTED Final   Parainfluenza Virus 1 NOT DETECTED NOT DETECTED Final   Parainfluenza Virus 2 NOT DETECTED NOT DETECTED Final   Parainfluenza Virus 3 NOT DETECTED NOT DETECTED Final   Parainfluenza Virus 4 NOT DETECTED NOT DETECTED Final   Respiratory Syncytial Virus NOT DETECTED NOT DETECTED Final   Bordetella pertussis NOT DETECTED NOT DETECTED Final   Chlamydophila pneumoniae NOT DETECTED NOT DETECTED Final   Mycoplasma pneumoniae NOT DETECTED NOT DETECTED Final    Comment: Performed at [facility], [place] [address], [place], [place] [address]  MRSA PCR Screening     Status: None   Collection Time: 02/09/2019  6:45 PM   Specimen: Nasal Mucosa; Nasopharyngeal  Result Value Ref Range Status   MRSA by PCR NEGATIVE NEGATIVE Final    Comment:        The GeneXpert MRSA Assay (FDA approved for NASAL specimens only), is one component of a comprehensive MRSA colonization surveillance program. It is not intended to diagnose MRSA infection nor to guide or monitor treatment for MRSA infections. Performed at [facility], [place] [address] [place], [place] [address]   Urine culture     Status: None   Collection Time: 02/24/2019  7:15 PM   Specimen: Urine, Random  Result Value Ref Range Status   Specimen Description   Final    URINE, RANDOM Performed at [place] [address] [place], [place] [address]    Special Requests   Final    NONE Performed at [facility], [place] [address], [place], [place] [address]    Culture   Final    NO GROWTH Performed at [facility], [place] [address] [place],  [address]    Report Status 03/01/2019 FINAL  Final         Radiology Studies: No results found.      Scheduled Meds: . aspirin EC  81 mg Oral Daily  . Chlorhexidine Gluconate Cloth  6 each Topical Daily  . enoxaparin (LOVENOX) injection  60 mg Subcutaneous BID  .  fluticasone  2 spray Each Nare Daily  . guaiFENesin  1,200 mg Oral BID  . insulin aspart  0-20 Units Subcutaneous TID WC  . insulin aspart  4 Units Subcutaneous TID WC  . insulin detemir  16 Units Subcutaneous QHS  . irbesartan  75 mg Oral Daily  . latanoprost  1 drop Both Eyes QHS  . loratadine  10 mg Oral Daily  . mouth rinse  15 mL Mouth Rinse BID  . methylPREDNISolone (SOLU-MEDROL) injection  60 mg Intravenous Q12H  . metoprolol tartrate  5 mg Intravenous Q6H  . omega-3 acid ethyl esters  1 g Oral Daily  . pantoprazole  40 mg Oral Daily  . rosuvastatin  10 mg Oral Daily  . timolol  1 drop Left Eye BID  . umeclidinium-vilanterol  1 puff Inhalation Daily   Continuous Infusions: . ceFEPime (MAXIPIME) IV 2 g (03/03/19 0053)  . remdesivir 100 mg in NS 250 mL Stopped (03/02/19 0938)     LOS: 4 days   The patient is critically ill with multiple organ systems failure and requires high complexity decision making for assessment and support, frequent evaluation and titration of  therapies, application of advanced monitoring technologies and extensive interpretation of multiple databases. Critical Care Time devoted to patient care services described in this note  Time spent: 40 minutes     Babs Dabbs, Geraldo Docker, MD [facility] Pager [phone]  If 7PM-7AM, please contact night-coverage [email or web address] Password [facility] 03/03/2019, 8:17 AM

## 2019-03-03 NOTE — Progress Notes (Signed)
[facility] PCCM  Called emergently to bedside for severe agitation, hypoxemia Received ativan for anxiety, morphine earlier  On exam severe agitation, hypoxemia to 5% Confused Cyanotic Combative  Impression ICU delirium Benzodiazepine effect Severe ARDS/COVID 19 pneumonia Chronic HP  Plan: No more ativan Haldol 5mg  IM precedex infusion  Additional cc time 20 min  Roselie Awkward, MD [place] [facility] Pager: [phone] Cell: [phone] If no response, call [phone]

## 2019-03-03 NOTE — Progress Notes (Addendum)
Inpatient Diabetes Program Recommendations  AACE/ADA: New Consensus Statement on Inpatient Glycemic Control (2015)  Target Ranges:  Prepandial:   less than 140 mg/dL      Peak postprandial:   less than 180 mg/dL (1-2 hours)      Critically ill patients:  140 - 180 mg/dL   Lab Results  Component Value Date   GLUCAP 290 (H) 03/03/2019   HGBA1C 7.0 (H) 02/28/2019    Review of Glycemic Control  CBGs above goal of < 180 mg/dL. Both FBS and post-prandials elevated.  May benefit from insulin adjustment.  Inpatient Diabetes Program Recommendations:     Increase Levemir to 18 units QHS Increase Novolog to 6 units tidwc for meal coverage insulin.  Will follow daily. Secure text to Dr Sherral Hammers.  Thank you. Lorenda Peck, RD, LDN, CDE Inpatient Diabetes Coordinator [phone]

## 2019-03-03 DEATH — deceased

## 2019-03-04 DIAGNOSIS — J9601 Acute respiratory failure with hypoxia: Secondary | ICD-10-CM | POA: Diagnosis not present

## 2019-03-04 DIAGNOSIS — J189 Pneumonia, unspecified organism: Secondary | ICD-10-CM | POA: Diagnosis not present

## 2019-03-04 DIAGNOSIS — J431 Panlobular emphysema: Secondary | ICD-10-CM | POA: Diagnosis not present

## 2019-03-04 DIAGNOSIS — E118 Type 2 diabetes mellitus with unspecified complications: Secondary | ICD-10-CM | POA: Diagnosis not present

## 2019-03-04 LAB — CBC WITH DIFFERENTIAL/PLATELET
Abs Immature Granulocytes: 0.12 10*3/uL — ABNORMAL HIGH (ref 0.00–0.07)
Basophils Absolute: 0 10*3/uL (ref 0.0–0.1)
Basophils Relative: 0 %
Eosinophils Absolute: 0 10*3/uL (ref 0.0–0.5)
Eosinophils Relative: 0 %
HCT: 48.4 % (ref 39.0–52.0)
Hemoglobin: 15.6 g/dL (ref 13.0–17.0)
Immature Granulocytes: 1 %
Lymphocytes Relative: 5 %
Lymphs Abs: 0.5 10*3/uL — ABNORMAL LOW (ref 0.7–4.0)
MCH: 30.4 pg (ref 26.0–34.0)
MCHC: 32.2 g/dL (ref 30.0–36.0)
MCV: 94.2 fL (ref 80.0–100.0)
Monocytes Absolute: 0.5 10*3/uL (ref 0.1–1.0)
Monocytes Relative: 5 %
Neutro Abs: 10.1 10*3/uL — ABNORMAL HIGH (ref 1.7–7.7)
Neutrophils Relative %: 89 %
Platelets: 356 10*3/uL (ref 150–400)
RBC: 5.14 MIL/uL (ref 4.22–5.81)
RDW: 13.3 % (ref 11.5–15.5)
WBC: 11.3 10*3/uL — ABNORMAL HIGH (ref 4.0–10.5)
nRBC: 0 % (ref 0.0–0.2)

## 2019-03-04 LAB — CULTURE, BLOOD (ROUTINE X 2)
Culture: NO GROWTH
Culture: NO GROWTH

## 2019-03-04 LAB — COMPREHENSIVE METABOLIC PANEL
ALT: 53 U/L — ABNORMAL HIGH (ref 0–44)
AST: 25 U/L (ref 15–41)
Albumin: 3.4 g/dL — ABNORMAL LOW (ref 3.5–5.0)
Alkaline Phosphatase: 115 U/L (ref 38–126)
Anion gap: 13 (ref 5–15)
BUN: 42 mg/dL — ABNORMAL HIGH (ref 8–23)
CO2: 25 mmol/L (ref 22–32)
Calcium: 9.4 mg/dL (ref 8.9–10.3)
Chloride: 106 mmol/L (ref 98–111)
Creatinine, Ser: 0.89 mg/dL (ref 0.61–1.24)
GFR calc Af Amer: >60 mL/min (ref >60)
GFR calc non Af Amer: >60 mL/min (ref >60)
Glucose, Bld: 184 mg/dL — ABNORMAL HIGH (ref 70–99)
Potassium: 4.5 mmol/L (ref 3.5–5.1)
Sodium: 144 mmol/L (ref 135–145)
Total Bilirubin: 0.9 mg/dL (ref 0.3–1.2)
Total Protein: 7.1 g/dL (ref 6.5–8.1)

## 2019-03-04 LAB — FERRITIN: Ferritin: 449 ng/mL — ABNORMAL HIGH (ref 24–336)

## 2019-03-04 LAB — GLUCOSE, CAPILLARY
Glucose-Capillary: 187 mg/dL — ABNORMAL HIGH (ref 70–99)
Glucose-Capillary: 195 mg/dL — ABNORMAL HIGH (ref 70–99)
Glucose-Capillary: 246 mg/dL — ABNORMAL HIGH (ref 70–99)
Glucose-Capillary: 225 mg/dL — ABNORMAL HIGH (ref 70–99)

## 2019-03-04 LAB — C-REACTIVE PROTEIN: CRP: 1.8 mg/dL — ABNORMAL HIGH (ref <1.0)

## 2019-03-04 LAB — D-DIMER, QUANTITATIVE: D-Dimer, Quant: 1.99 ug/mL-FEU — ABNORMAL HIGH (ref 0.00–0.50)

## 2019-03-04 LAB — MAGNESIUM: Magnesium: 2.6 mg/dL — ABNORMAL HIGH (ref 1.7–2.4)

## 2019-03-04 LAB — LACTATE DEHYDROGENASE: LDH: 558 U/L — ABNORMAL HIGH (ref 98–192)

## 2019-03-04 LAB — PHOSPHORUS: Phosphorus: 3.8 mg/dL (ref 2.5–4.6)

## 2019-03-04 MED ORDER — HYDRALAZINE HCL 25 MG PO TABS
25.0000 mg | ORAL_TABLET | Freq: Three times a day (TID) | ORAL | Status: DC
  Administered 2019-03-04 – 2019-03-05 (×5): 25 mg via ORAL
  Filled 2019-03-04 (×6): qty 1

## 2019-03-04 MED ORDER — SALINE SPRAY 0.65 % NA SOLN
1.0000 | NASAL | Status: DC | PRN
  Administered 2019-03-04 – 2019-03-25 (×3): 1 via NASAL
  Filled 2019-03-04: qty 44

## 2019-03-04 MED ORDER — POTASSIUM CHLORIDE CRYS ER 20 MEQ PO TBCR
20.0000 meq | EXTENDED_RELEASE_TABLET | Freq: Once | ORAL | Status: AC
  Administered 2019-03-04: 20 meq via ORAL
  Filled 2019-03-04: qty 1

## 2019-03-04 MED ORDER — METHYLPREDNISOLONE SODIUM SUCC 40 MG IJ SOLR
30.0000 mg | Freq: Every day | INTRAMUSCULAR | Status: DC
  Administered 2019-03-05 – 2019-03-15 (×11): 30 mg via INTRAVENOUS
  Filled 2019-03-04 (×11): qty 1

## 2019-03-04 MED ORDER — FUROSEMIDE 10 MG/ML IJ SOLN
40.0000 mg | Freq: Four times a day (QID) | INTRAMUSCULAR | Status: AC
  Administered 2019-03-04 (×2): 40 mg via INTRAVENOUS
  Filled 2019-03-04 (×2): qty 4

## 2019-03-04 MED ORDER — FUROSEMIDE 10 MG/ML IJ SOLN
40.0000 mg | Freq: Once | INTRAMUSCULAR | Status: AC
  Administered 2019-03-04: 40 mg via INTRAVENOUS
  Filled 2019-03-04: qty 4

## 2019-03-04 MED ORDER — QUETIAPINE FUMARATE 25 MG PO TABS
50.0000 mg | ORAL_TABLET | Freq: Every day | ORAL | Status: DC
  Administered 2019-03-04 – 2019-04-05 (×33): 50 mg via ORAL
  Filled 2019-03-04 (×2): qty 2
  Filled 2019-03-04: qty 1
  Filled 2019-03-04 (×8): qty 2
  Filled 2019-03-04: qty 1
  Filled 2019-03-04 (×11): qty 2
  Filled 2019-03-04: qty 1
  Filled 2019-03-04 (×3): qty 2
  Filled 2019-03-04: qty 1
  Filled 2019-03-04 (×5): qty 2
  Filled 2019-03-04: qty 1

## 2019-03-04 MED ORDER — ALPRAZOLAM 0.5 MG PO TABS
0.2500 mg | ORAL_TABLET | Freq: Two times a day (BID) | ORAL | Status: DC | PRN
  Administered 2019-03-04 – 2019-03-10 (×5): 0.25 mg via ORAL
  Filled 2019-03-04 (×5): qty 1

## 2019-03-04 NOTE — Progress Notes (Signed)
0325-pt spontaneously desaturation to 85% on heated HFNC 40L 100% & NRB without recovery. Pt calm, A/Ox2 0336- RT bedside, increased heated HFNC 45L100%, pulse ox probe changed, O2 remains in 57s 0349- ELink RN called and notified of change in status 0422- Sommer MD returned called and provided new orders of lasix. 29- pt daughter and spouse notified via phone of patient decline. All questions answered at present time

## 2019-03-04 NOTE — Progress Notes (Signed)
PROGRESS NOTE                                                                                                                                                                                                             Patient Demographics:    Justin Atkinson, is a 78 y.o. male, DOB - Jun 18, 1940, ZOX:[id number]  Admit date - 02/12/2019   Admitting Physician Eugenie Filler, MD  Outpatient Primary MD for the patient is [name], MD  LOS - 5   No chief complaint on file.      Brief Narrative    78 year old  WM PMHx COPD/pulmonary fibrosis recently diagnosed hypersensitivity pneumonitis (had VATS wedge resection on 11/9,follows with Dr. Vaughan Browner) who was recently started on steroid on 11/18, HTN, diabetes type 2 uncontrolled with complication, HLD, and DJD . -Patient admitted to ICU given profound hypoxia, still requiring heated high flow oxygen and nonrebreather as well, received Actemra, plasma, treated with remdesivir and steroids, still showing minimal improvement.   Subjective:    Justin Atkinson with no significant events overnight, patient was started on Precedex drip yesterday secondary to significant agitation .   Assessment  & Plan :    Principal Problem:   Acute respiratory failure with hypoxia (HCC) Active Problems:   Hypertension   Hypercholesterolemia   Benign prostate hyperplasia   ILD (interstitial lung disease) (HCC)   Postinflammatory pulmonary fibrosis (HCC)   HCAP (healthcare-associated pneumonia)   Chronic obstructive pulmonary disease (HCC)   Pneumonia due to COVID-19 virus   Severe sepsis (HCC)   Essential hypertension   Diabetes mellitus type 2, controlled, with complications ([place])   Pulmonary fibrosis (HCC)   Hypersensitivity pneumonitis (HCC)   Acute hypoxic respiratory failure due to COVID-19 pneumonia -Remains significantly hypoxic, remains on both 45 L heated high flow nasal cannula and NRB. -Currently  DNR,per PCCM D/W family. -Remains in ICU. -Received convalescent plasma 11/30 -Received Actemra 11/29 -Patient remains with significant oxygen requirement, recent diagnosis of hypersensitivity pneumonitis, continue with IV Solu-Medrol 60 mg IV twice daily. -Continue to follow inflammatory markers closely. -On diuresis as needed per [facility]   COVID-19 Labs  Recent Labs    03/02/19 0455 03/03/19 0450 03/04/19 0520  DDIMER 1.35* 1.92* 1.99*  FERRITIN 600* 484* 449*  LDH 467* 574* 558*  CRP 6.0* 3.2* 1.8*    Lab  Results  Component Value Date   SARSCOV2NAA POSITIVE (A) 03/01/2019   [place] NOT DETECTED 02/05/2019   [place] NOT DETECTED 01/09/2019   Hypersensivitivy pneumonitis/ Hx of ILD -Following with pulmonary Dr. Vaughan Browner as an outpatient -Diagnosed with recent biopsy, continue with IV steroids - Prednisone 48m daily at discharge until seen in pulmonary clinic per PCCM. negative 4.6 L since admission  Hypertension -Blood pressure remains uncontrolled, continue with irbesartan, will increase hydralazine, continue with as needed meds  Hyperlipidemia -Continue with statin  Acute metabolic encephalopathy/hospital delirium -Patient with significant agitation/confusion yesterday, currently on Precedex drip, continue with nightly Seroquel as well  Diabetes mellitus controlled -11/28 hemoglobin A1c= 7.0 -Better controlled after increasing Levemir to 18 units nightly, continue with assistance sliding scale. -12/1 increase Levemir 18 units qhs -12/1 increase NovoLog 6 units qac  Code Status : DNR  Family Communication  : updated daughter sharon and wife today  Disposition Plan  : remains in ICU  Consults  :  PCCM  Procedures  : None  DVT Prophylaxis  :   lovenox  Lab Results  Component Value Date   PLT 356 03/04/2019    Antibiotics  :    Anti-infectives (From admission, onward)   Start     Dose/Rate Route Frequency Ordered Stop   03/01/19 0800   remdesivir 100 mg in sodium chloride 0.9 % 250 mL IVPB     100 mg 500 mL/hr over 30 Minutes Intravenous Every 24 hours 02/28/19 0823 03/05/19 0959   02/28/19 1000  remdesivir 200 mg in sodium chloride 0.9 % 250 mL IVPB     200 mg 500 mL/hr over 30 Minutes Intravenous Once 02/28/19 0823 02/28/19 1006   02/28/19 0000  vancomycin (VANCOCIN) 1,250 mg in sodium chloride 0.9 % 250 mL IVPB  Status:  Discontinued     1,250 mg 166.7 mL/hr over 90 Minutes Intravenous Every 12 hours 02/13/2019 1639 02/26/2019 2037   02/05/2019 2200  ceFEPIme (MAXIPIME) 2 g in sodium chloride 0.9 % 100 mL IVPB  Status:  Discontinued     2 g 200 mL/hr over 30 Minutes Intravenous Every 8 hours 02/11/2019 1629 03/03/19 1242   02/12/2019 1315  ceFEPIme (MAXIPIME) 2 g in sodium chloride 0.9 % 100 mL IVPB     2 g 200 mL/hr over 30 Minutes Intravenous  Once 02/03/2019 1306 02/28/19 0324   03/02/2019 1315  vancomycin (VANCOCIN) 2,000 mg in sodium chloride 0.9 % 500 mL IVPB     2,000 mg 250 mL/hr over 120 Minutes Intravenous  Once 02/14/2019 1311 02/28/19 0324        Objective:   Vitals:   03/04/19 0845 03/04/19 0900 03/04/19 1000 03/04/19 1100  BP: (!) 155/85 (!) 162/87 (!) 150/82 (!) 161/100  Pulse: (!) 56 62 (!) 59 68  Resp: (!) 28 (!) 24 (!) 27 (!) 34  Temp:      TempSrc:      SpO2:  (!) 84% (!) 87% (!) 78%  Weight:      Height:        Wt Readings from Last 3 Encounters:  02/28/19 130.7 kg  02/20/19 (!) 137 kg  02/18/19 (!) 137 kg     Intake/Output Summary (Last 24 hours) at 03/04/2019 1138 Last data filed at 03/04/2019 1132 Gross per 24 hour  Intake 612.75 ml  Output 3025 ml  Net -2412.25 ml     Physical Exam  Awake Alert, easily distracted, answering some questions appropriately, confused  Symmetrical Chest wall movement, bibasilar  crackles, with some increased work of breathing RRR,No Gallops,Rubs or new Murmurs, No Parasternal Heave +ve B.Sounds, Abd Soft, No tenderness, No rebound - guarding or  rigidity. No Cyanosis, Clubbing or edema, No new Rash or bruise      Data Review:    CBC Recent Labs  Lab 02/03/2019 1147 02/28/19 0229 03/01/19 0333 03/02/19 0455 03/02/19 0649 03/03/19 0450 03/04/19 0520  WBC 8.9 8.3 13.2* 10.6*  --  12.1* 11.3*  HGB 13.4 13.6 14.2 14.0 13.9 14.5 15.6  HCT 41.3 42.6 44.8 43.6 41.0 44.9 48.4  PLT 257 272 362 363  --  361 356  MCV 94.7 96.4 95.1 94.6  --  94.7 94.2  MCH 30.7 30.8 30.1 30.4  --  30.6 30.4  MCHC 32.4 31.9 31.7 32.1  --  32.3 32.2  RDW 13.3 13.3 13.3 13.4  --  13.3 13.3  LYMPHSABS 0.6* 0.4*  --  0.4*  --  0.5* 0.5*  MONOABS 0.7 0.3  --  0.5  --  0.3 0.5  EOSABS 0.0 0.0  --  0.0  --  0.0 0.0  BASOSABS 0.0 0.0  --  0.0  --  0.0 0.0    Chemistries  Recent Labs  Lab 02/02/2019 1147 02/28/19 0229 03/01/19 0333 03/01/19 1220 03/02/19 0455 03/02/19 0649 03/03/19 0450 03/04/19 0520  NA 136 137 141  --  142 141 142 144  K 3.9 4.2 4.1  --  4.8 4.4 4.7 4.5  CL 102 104 105  --  106  --  106 106  CO2 '24 23 24  ' --  25  --  24 25  GLUCOSE 251* 245* 241*  --  281*  --  213* 184*  BUN 24* 22 31*  --  37*  --  35* 42*  CREATININE 0.88 0.89 0.92  --  0.92  --  0.84 0.89  CALCIUM 8.5* 8.4* 9.1  --  8.9  --  9.1 9.4  MG  --   --   --  2.2 2.6*  --  2.4 2.6*  AST 40  --  42*  --  30  --  34 25  ALT 48*  --  63*  --  51*  --  55* 53*  ALKPHOS 72  --  88  --  93  --  105 115  BILITOT 1.0  --  0.8  --  1.0  --  1.0 0.9   ------------------------------------------------------------------------------------------------------------------ No results for input(s): CHOL, HDL, LDLCALC, TRIG, CHOLHDL, LDLDIRECT in the last 72 hours.  Lab Results  Component Value Date   HGBA1C 7.0 (H) 02/28/2019   ------------------------------------------------------------------------------------------------------------------ No results for input(s): TSH, T4TOTAL, T3FREE, THYROIDAB in the last 72 hours.  Invalid input(s):  FREET3 ------------------------------------------------------------------------------------------------------------------ Recent Labs    03/03/19 0450 03/04/19 0520  FERRITIN 484* 449*    Coagulation profile No results for input(s): INR, PROTIME in the last 168 hours.  Recent Labs    03/03/19 0450 03/04/19 0520  DDIMER 1.92* 1.99*    Cardiac Enzymes No results for input(s): CKMB, TROPONINI, MYOGLOBIN in the last 168 hours.  Invalid input(s): CK ------------------------------------------------------------------------------------------------------------------    Component Value Date/Time   BNP 147.0 (H) 02/11/2019 1147    Inpatient Medications  Scheduled Meds:  aspirin EC  81 mg Oral Daily   Chlorhexidine Gluconate Cloth  6 each Topical Daily   enoxaparin (LOVENOX) injection  60 mg Subcutaneous BID   fluticasone  2 spray Each Nare Daily   furosemide  40 mg  Intravenous Q6H   guaiFENesin  1,200 mg Oral BID   hydrALAZINE  10 mg Oral Q6H   insulin aspart  0-20 Units Subcutaneous TID WC   insulin aspart  6 Units Subcutaneous TID WC   insulin detemir  18 Units Subcutaneous QHS   irbesartan  75 mg Oral Daily   latanoprost  1 drop Both Eyes QHS   loratadine  10 mg Oral Daily   mouth rinse  15 mL Mouth Rinse BID   methylPREDNISolone (SOLU-MEDROL) injection  60 mg Intravenous Q12H   metoprolol tartrate  5 mg Intravenous Q6H   omega-3 acid ethyl esters  1 g Oral Daily   pantoprazole  40 mg Oral Daily   rosuvastatin  10 mg Oral Daily   timolol  1 drop Left Eye BID   traZODone  25 mg Oral QHS   umeclidinium-vilanterol  1 puff Inhalation Daily   Continuous Infusions:  dexmedetomidine (PRECEDEX) IV infusion 0.1 mcg/kg/hr (03/04/19 0900)   remdesivir 100 mg in NS 250 mL Stopped (03/03/19 1151)   PRN Meds:.acetaminophen, albuterol, haloperidol lactate, LORazepam, morphine injection, ondansetron (ZOFRAN) IV  Micro Results Recent Results (from the past  240 hour(s))  Blood culture (routine x 2)     Status: None   Collection Time: 02/26/2019 11:48 AM   Specimen: BLOOD  Result Value Ref Range Status   Specimen Description   Final    BLOOD RIGHT ANTECUBITAL Performed at [facility], [place] [address], [place], [place] [address]    Special Requests   Final    BOTTLES DRAWN AEROBIC AND ANAEROBIC Blood Culture results may not be optimal due to an excessive volume of blood received in culture bottles Performed at [place] [address] [place], [place] [address]    Culture   Final    NO GROWTH 5 DAYS Performed at [facility], [place] [address] [place], [place] [address]    Report Status 03/04/2019 FINAL  Final  Blood culture (routine x 2)     Status: None   Collection Time: 02/10/2019 11:50 AM   Specimen: BLOOD RIGHT HAND  Result Value Ref Range Status   Specimen Description   Final    BLOOD RIGHT HAND Performed at [facility] [address] [place], [place] [address]    Special Requests   Final    BOTTLES DRAWN AEROBIC AND ANAEROBIC Blood Culture results may not be optimal due to an excessive volume of blood received in culture bottles Performed at [facility] [address] [place], [place] [address]    Culture   Final    NO GROWTH 5 DAYS Performed at [facility], [place] [address], [place], [place] [address]    Report Status 03/04/2019 FINAL  Final  SARS CORONAVIRUS 2 (TAT 6-24 HRS) Nasopharyngeal Nasopharyngeal Swab     Status: Abnormal   Collection Time: 02/01/2019  2:00 PM   Specimen: Nasopharyngeal Swab  Result Value Ref Range Status   SARS Coronavirus 2 POSITIVE (A) NEGATIVE Final    Comment: RESULT CALLED TO, READ BACK BY AND VERIFIED WITH: K.GARCIA RN 1950 02/21/2019 MCCORMICK K (NOTE) SARS-CoV-2 target nucleic acids are DETECTED. The SARS-CoV-2 RNA is generally detectable in upper and lower respiratory specimens during the acute  phase of infection. Positive results are indicative of the presence of SARS-CoV-2 RNA. Clinical correlation with patient history and other diagnostic information is  necessary to determine patient infection status. Positive results do not  rule out bacterial infection or co-infection with other viruses.  The expected result is Negative. Fact Sheet for Patients: SugarRoll.be Fact Sheet for Healthcare Providers: https://www.woods-mathews.com/ This test is not yet approved or cleared by the [place] FDA and  has been authorized for detection and/or diagnosis of SARS-CoV-2 by FDA under an Emergency Use Authorization (EUA). This EUA will remain  in effect (meaning this test can be used) for  the duration of the COVID-19 declaration under Section 564(b)(1) of the Act, 21 U.S.C. section 360bbb-3(b)(1), unless the authorization is terminated or revoked sooner. Performed at [facility], [place] [address] [place], [place] [address]   Culture, sputum-assessment     Status: None   Collection Time: 02/24/2019  4:04 PM   Specimen: Sputum  Result Value Ref Range Status   Specimen Description SPUTUM  Final   Special Requests NONE  Final   Sputum evaluation   Final    Sputum specimen not acceptable for testing.  Please recollect.   RESULT CALLED TO, READ BACK BY AND VERIFIED WITH: I [place] 02/28/19 2116 RHOLMES Performed at [facility], [place] [address], [place], [place] [address]    Report Status 02/28/2019 FINAL  Final  Respiratory Panel by PCR     Status: None   Collection Time: 02/06/2019  6:45 PM   Specimen: Flu Kit Nasopharyngeal Swab; Respiratory  Result Value Ref Range Status   Adenovirus NOT DETECTED NOT DETECTED Final   Coronavirus 229E NOT DETECTED NOT DETECTED Final    Comment: (NOTE) The Coronavirus on the Respiratory Panel, DOES NOT test for the novel  Coronavirus (2019 nCoV)    Coronavirus HKU1 NOT DETECTED NOT  DETECTED Final   Coronavirus NL63 NOT DETECTED NOT DETECTED Final   Coronavirus OC43 NOT DETECTED NOT DETECTED Final   Metapneumovirus NOT DETECTED NOT DETECTED Final   Rhinovirus / Enterovirus NOT DETECTED NOT DETECTED Final   Influenza A NOT DETECTED NOT DETECTED Final   Influenza B NOT DETECTED NOT DETECTED Final   Parainfluenza Virus 1 NOT DETECTED NOT DETECTED Final   Parainfluenza Virus 2 NOT DETECTED NOT DETECTED Final   Parainfluenza Virus 3 NOT DETECTED NOT DETECTED Final   Parainfluenza Virus 4 NOT DETECTED NOT DETECTED Final   Respiratory Syncytial Virus NOT DETECTED NOT DETECTED Final   Bordetella pertussis NOT DETECTED NOT DETECTED Final   Chlamydophila pneumoniae NOT DETECTED NOT DETECTED Final   Mycoplasma pneumoniae NOT DETECTED NOT DETECTED Final    Comment: Performed at [facility], [place]. [address], [place], [place] [address]  MRSA PCR Screening     Status: None   Collection Time: 03/02/2019  6:45 PM   Specimen: Nasal Mucosa; Nasopharyngeal  Result Value Ref Range Status   MRSA by PCR NEGATIVE NEGATIVE Final    Comment:        The GeneXpert MRSA Assay (FDA approved for NASAL specimens only), is one component of a comprehensive MRSA colonization surveillance program. It is not intended to diagnose MRSA infection nor to guide or monitor treatment for MRSA infections. Performed at [facility], [place] [address], [place], [place] [address]   Urine culture     Status: None   Collection Time: 02/23/2019  7:15 PM   Specimen: Urine, Random  Result Value Ref Range Status   Specimen Description   Final    URINE, RANDOM Performed at [facility] [address] [place], [place] [address]    Special Requests   Final  NONE Performed at [facility], [place] [address], [place], [place] [address]    Culture   Final    NO GROWTH Performed at [facility], [place] [address], [place], [place] [address]     Report Status 03/01/2019 FINAL  Final    Radiology Reports Dg Chest 2 View  Result Date: 02/20/2019 CLINICAL DATA:  Interstitial lung disease, COPD, diabetes mellitus, hypertension EXAM: CHEST - 2 VIEW COMPARISON:  02/11/2019, 02/09/2019 FINDINGS: Enlargement of cardiac silhouette. Mediastinal contours and pulmonary vascularity normal. Mild accentuation of interstitial markings in the periphery of both lungs. No acute infiltrate, pleural effusion or pneumothorax. Osseous structures unremarkable. IMPRESSION: Peripheral interstitial prominence in the lungs bilaterally, slightly greater at bases. No definite acute infiltrate. Mild enlargement of cardiac silhouette. Electronically Signed   By: Lavonia Dana M.D.   On: 02/20/2019 13:08   Dg Chest 2 View  Result Date: 02/09/2019 CLINICAL DATA:  Pre right lung biopsy evaluation. Pulmonary fibrosis. Ex-smoker. EXAM: CHEST - 2 VIEW COMPARISON:  Chest CT dated 09/22/2018. Chest radiographs dated 07/25/2012 FINDINGS: The cardiac silhouette is borderline enlarged. The aorta is tortuous. Stable minimal linear scarring at the left lung base with interval minimal linear scarring in the left mid lung zone. Mild diffuse peribronchial thickening with improvement. Thoracic and upper lumbar spine degenerative changes. IMPRESSION: 1. No acute abnormality. 2. Mild chronic bronchitic changes. Electronically Signed   By: Claudie Revering M.D.   On: 02/09/2019 09:32   Ct Angio Chest Pe W And/or Wo Contrast  Result Date: 02/12/2019 CLINICAL DATA:  PE suspected, intermediate prob, neg D-dimer. Cough and shortness of breath. Recent VATS lung biopsy 02/09/2019. EXAM: CT ANGIOGRAPHY CHEST WITH CONTRAST TECHNIQUE: Multidetector CT imaging of the chest was performed using the standard protocol during bolus administration of intravenous contrast. Multiplanar CT image reconstructions and MIPs were obtained to evaluate the vascular anatomy. CONTRAST:  178m OMNIPAQUE IOHEXOL 350 MG/ML SOLN  COMPARISON:  Radiographs earlier this day. Chest CT 09/22/2018 FINDINGS: Cardiovascular: Contrast bolus timing and breathing motion artifact limits detailed assessment. No central filling defects in the pulmonary arteries, evaluation is diagnostic to the lobar level. There is opacification of multiple segmental and subsegmental branches, however more detailed assessment is limited. Multi chamber cardiomegaly. Minor aortic atherosclerosis without aneurysm. No aortic dissection. No pericardial effusion. There is contrast refluxing into the hepatic veins and IVC. Mediastinum/Nodes: No enlarged mediastinal, hilar, or axillary lymph nodes. Patulous esophagus without wall thickening. No visualized thyroid nodule. Lungs/Pleura: Ground-glass opacities, fairly diffuse throughout the left lung and involving the right lower lobe. Patchy involvement of the right upper and right middle lobes. Findings are new from prior exam. There is no definite septal thickening. No pneumothorax or confluent airspace disease. No significant pleural fluid. Trachea and mainstem bronchi are patent. Pulmonary nodules on prior exam are obscured by motion. Emphysema. Upper Abdomen: Contrast refluxes into the hepatic veins and IVC. Right renal cyst partially included. Suspected hepatic steatosis. Musculoskeletal: Subcutaneous emphysema throughout the right chest wall which tracks posteriorly and anteriorly chest to the left of midline. This is likely related to recent VATS. Unchanged rounded sclerotic density within T10. degenerative change in the spine. Review of the MIP images confirms the above findings. IMPRESSION: 1. Ground-glass opacities throughout both lungs, diffuse on the left and more patchy in geographic on the right. Findings may represent asymmetric pulmonary edema or infection, including atypical viral organisms. 2. Mild cardiomegaly. Reflux of contrast into the hepatic veins and IVC consistent with elevated right heart  pressures. 3.  Limited assessment for pulmonary embolus given contrast bolus timing and patient motion, no large central pulmonary embolism. 4. Subcutaneous emphysema in the right chest wall likely related to recent VATS. No pneumothorax. Aortic Atherosclerosis (ICD10-I70.0). Emphysema (ICD10-J43.9). Electronically Signed   By: Keith Rake M.D.   On: 02/04/2019 17:08   Dg Chest Port 1 View  Result Date: 02/12/2019 CLINICAL DATA:  Cough dyspnea. EXAM: PORTABLE CHEST 1 VIEW COMPARISON:  CT chest 09/22/2018, chest x-ray of February 20, 2019 FINDINGS: Heart size is enlarged as before. Bibasilar opacities have developed since the 02/20/2019 with dense retrocardiac opacification and with more interstitial and subtle alveolar opacities in the right chest. Patient reportedly had a lung biopsy. There is no visible pneumothorax on this semi erect view. Subcutaneous emphysema is noted along the right lateral chest. No acute bone finding. IMPRESSION: 1. Findings that may represent multifocal pneumonia or asymmetric edema. 2. Consolidation or volume loss at the left lung base greater than right. 3. Pulmonary hemorrhage considered on the right given the patient's history of recent right-sided biopsy. 4. No signs of pneumothorax on semi erect view though there is subcutaneous emphysema along the right chest wall. Occult pneumothorax is not excluded. Electronically Signed   By: Zetta Bills M.D.   On: 02/11/2019 11:35   Dg Chest Port 1 View  Result Date: 02/11/2019 CLINICAL DATA:  Chest tube removal EXAM: PORTABLE CHEST 1 VIEW COMPARISON:  February 10, 2019 FINDINGS: Interval removal of right chest tube with new right chest wall subcutaneous emphysema. No definite pneumothorax. Low lung volumes with bibasilar atelectasis. Stable cardiomediastinal contours. IMPRESSION: Post right chest tube removal with new subcutaneous emphysema. No definite pneumothorax. Electronically Signed   By: Macy Mis M.D.   On: 02/11/2019 08:14    Dg Chest Port 1 View  Result Date: 02/10/2019 CLINICAL DATA:  Patient status post right upper, middle and lower lobe wedge resection 02/09/2019. Chest tube in place. EXAM: PORTABLE CHEST 1 VIEW COMPARISON:  Single-view of the chest 02/09/2019. FINDINGS: Right chest tube remains in place. No pneumothorax. Subsegmental atelectasis in the right lung base has increased. The left lung is clear. Heart size is normal. IMPRESSION: Negative for pneumothorax with a right chest tube in place. Increased subsegmental atelectasis right lung base. Electronically Signed   By: Inge Rise M.D.   On: 02/10/2019 06:43   Dg Chest Port 1 View  Result Date: 02/09/2019 CLINICAL DATA:  Follow-up lung surgery EXAM: PORTABLE CHEST 1 VIEW COMPARISON:  02/09/2019 FINDINGS: Insertion of right-sided chest tube with tip at the apex. No visible pneumothorax. Mild cardiomegaly. Scarring at the left base. Right mid lung platelike atelectasis. No pleural effusion or consolidation. Coarse chronic interstitial opacity. Small amount of right lower chest wall emphysema. IMPRESSION: 1. Insertion of right-sided chest tube with tip at the apex, no visible pneumothorax 2. Mild cardiomegaly. Minimal platelike atelectasis in the right mid lung. Scarring at the left base Electronically Signed   By: Donavan Foil M.D.   On: 02/09/2019 16:15     Phillips Climes M.D on 03/04/2019 at 11:38 AM  Between 7am to 7pm - Pager - [phone]  After 7pm go to [email or web address] - password [facility]  [facility] -  Office  [phone]

## 2019-03-04 NOTE — Progress Notes (Signed)
[facility] Progress Note Patient Name: Justin Atkinson. DOB: [birth year] MRN: [id number]   Date of Service  03/04/2019  HPI/Events of Note  Hypoxemia - Sats dropped into mid 70's. Not recovering. Not agitated. Patient is DNR/DNI. Not a candidate for BiPAP d/t COVID pneumonia. BP = 153/78 and RR = 36.   eICU Interventions  Will order: 1. Lasix 40 mg IV X 1 now.  2. Bedside nurse to advise family of the patient's deterioration.      Intervention Category Major Interventions: Hypoxemia - evaluation and management  [name] 03/04/2019, 4:22 AM

## 2019-03-04 NOTE — Progress Notes (Signed)
Assisted tele visit to patient with wife.  Camie Hauss Anderson, RN   

## 2019-03-04 NOTE — Progress Notes (Signed)
Pt daughter [name] has been updated and all questions were answered.

## 2019-03-04 NOTE — Progress Notes (Signed)
NAME:  Justin Atkinson, [place]inzman MRN:  [id number], DOB:  [birth year], LOS: 5 ADMISSION DATE:  02/05/2019, CONSULTATION DATE:  11/27 REFERRING MD:  11/28, CHIEF COMPLAINT:  dyspnea   Brief History   78 y/o male with chronic hypersensitivity pneumonitis admitted on 11/27 with acute on chronic respiratory failure with hypoxemia due to COVID 19 pneumonia.  Past Medical History  Chronic hypersensitivity pneumonitis by 02/09/2019 open lung biopsy Centrilobular emphysema DM2 HNT HLD DJD Bladder cancer Solitary kidney  Significant Hospital Events   11/27 admission 11/29 move to [place], overnight severe hypoxemia, dyspnea confusion, temporarily placed on BIPAP, made DNR  Consults:  PCCM  Procedures:    Significant Diagnostic Tests:  CTA 11/27 interval development of bilateral diffuse groundglass opacities compared to 6/22 CT chest  Micro Data:  11/27 RVP- negative 11/27 SARS-Covid-2- Positive 11/27 blood cultures-pending 11/27 sputum cultures-pending  Antimicrobials/COVID Rx  Vanco 11/27> Cefepime 11/27>   remdesivir 11/28 >  Solumedrol 60 q12 11/28 > Actemra 11/29 x1  Interim history/subjective:   Had a bad night as he pulled off oxygen mask, O2 saturation dropped to 20's, lasix helped, precedex, restraints  Objective   Blood pressure 129/82, pulse (!) 56, temperature 97.9 F (36.6 C), temperature source Axillary, resp. rate (!) 22, height 6\' 1"  (1.854 m), weight 130.7 kg, SpO2 (!) 88 %.    FiO2 (%):  [100 %] 100 %   Intake/Output Summary (Last 24 hours) at 03/04/2019 [id number] Last data filed at 03/04/2019 0700 Gross per 24 hour  Intake 706.9 ml  Output 2350 ml  Net -1643.1 ml   Filed Weights   02/08/2019 1810 02/28/19 0812  Weight: 130.7 kg 130.7 kg    Examination:  General:  Resting comfortably in bed HENT: NCAT OP clear PULM: Crackles bases B, normal effort CV: RRR, no mgr GI: BS+, soft, nontender MSK: normal bulk and tone Neuro: awake, alert and oriented x3 for  me, [facility] Problem list    Assessment & Plan:  Acute hypoxemic respiratory failure due to COVID 19 pneumonia: worsening hypoxemia overnight 11/30; given his baseline interstitial lung disease and progressive nature of COVID 57 and his age, I advised he would not do well with intubation, mechanical ventilation DNR Continue heated high flow O2 Tolerate periods of hypoxemia, goal at rest is greater than 85% SaO2, with movement ideally above 75% Out of bed to chair as able Incentive spirometry is important, use every hour Prone positioning while in bed Lasix today  Hypersensivitivy pneumonitis Decrease systemic steroids today due to agitation Prednisone 20mg  daily at discharge  Acute toxic metabolic encephalopathy: steroid/ICU related Haldol prn severe agitation precedex Decrease steroids Frequent orientation Minimize restraints Seroquel at night to help with sleep/sundowning   Best practice:   Code Status: DNR Family Communication:per TRH Disposition: remain in ICU, full scope of practice short of CPR, mechanical ventialtion, ACLS drugs in event of cardiac arrest  Labs   CBC: Recent Labs  Lab 02/18/2019 1147 02/28/19 0229 03/01/19 0333 03/02/19 0455 03/02/19 0649 03/03/19 0450  WBC 8.9 8.3 13.2* 10.6*  --  12.1*  NEUTROABS 7.5 7.5  --  9.7*  --  11.1*  HGB 13.4 13.6 14.2 14.0 13.9 14.5  HCT 41.3 42.6 44.8 43.6 41.0 44.9  MCV 94.7 96.4 95.1 94.6  --  94.7  PLT 257 272 362 363  --  [id number]    Basic Metabolic Panel: Recent Labs  Lab 02/24/2019 1147 02/28/19 0229 03/01/19 0333 03/01/19 1220 03/02/19 0455 03/02/19 [id number]  03/03/19 0450  NA 136 137 141  --  142 141 142  K 3.9 4.2 4.1  --  4.8 4.4 4.7  CL 102 104 105  --  106  --  106  CO2 24 23 24   --  25  --  24  GLUCOSE 251* 245* 241*  --  281*  --  213*  BUN 24* 22 31*  --  37*  --  35*  CREATININE 0.88 0.89 0.92  --  0.92  --  0.84  CALCIUM 8.5* 8.4* 9.1  --  8.9  --  9.1  MG  --   --   --  2.2  2.6*  --  2.4  PHOS  --   --   --   --  4.0  --  3.4   GFR: Estimated Creatinine Clearance: 106 mL/min (by C-G formula based on SCr of 0.84 mg/dL). Recent Labs  Lab 02/04/2019 1147 02/22/2019 1400 02/28/19 0229 02/28/19 0757 03/01/19 0333 03/02/19 0455 03/03/19 0450  PROCALCITON 0.13  --   --  <0.10 <0.10 <0.10 <0.10  WBC 8.9  --  8.3  --  13.2* 10.6* 12.1*  LATICACIDVEN 2.3* 1.4  --   --   --   --   --     Liver Function Tests: Recent Labs  Lab 02/24/2019 1147 03/01/19 0333 03/02/19 0455 03/03/19 0450  AST 40 42* 30 34  ALT 48* 63* 51* 55*  ALKPHOS 72 88 93 105  BILITOT 1.0 0.8 1.0 1.0  PROT 6.7 7.2 6.5 6.9  ALBUMIN 3.3* 3.2* 3.1* 3.2*   No results for input(s): LIPASE, AMYLASE in the last 168 hours. No results for input(s): AMMONIA in the last 168 hours.  ABG    Component Value Date/Time   PHART 7.419 03/02/2019 0649   PCO2ART 35.3 03/02/2019 0649   PO2ART 50.0 (L) 03/02/2019 0649   HCO3 22.8 03/02/2019 0649   TCO2 24 03/02/2019 0649   ACIDBASEDEF 1.0 03/02/2019 0649   O2SAT 86.0 03/02/2019 0649     Coagulation Profile: No results for input(s): INR, PROTIME in the last 168 hours.  Cardiac Enzymes: No results for input(s): CKTOTAL, CKMB, CKMBINDEX, TROPONINI in the last 168 hours.  HbA1C: Hgb A1c MFr Bld  Date/Time Value Ref Range Status  02/28/2019 02:29 AM 7.0 (H) 4.8 - 5.6 % Final    Comment:    (NOTE) Pre diabetes:          5.7%-6.4% Diabetes:              >6.4% Glycemic control for   <7.0% adults with diabetes     CBG: Recent Labs  Lab 03/02/19 2121 03/03/19 0814 03/03/19 1238 03/03/19 1658 03/03/19 1949  GLUCAP 256* 227* 290* 265* 211*     Critical care time: 40 minutes     Roselie Awkward, MD [place] Pager: [phone] Cell: [phone] If no response, call [phone]

## 2019-03-04 NOTE — Progress Notes (Signed)
PT Cancellation Note  Patient Details Name: Justin Atkinson. MRN: [id number] DOB: 05-Sep-1940   Cancelled Treatment:    Reason Eval/Treat Not Completed: Medical issues which prohibited therapy.  RN advised to hold PT for this pt today due to high O2 demands and quick desaturations.  PT will check on him tomorrow.  Thanks,  Verdene Lennert, PT, DPT  Acute Rehabilitation [phone] pager [phone] office  @ [facility]: [phone]     Harvie Heck 03/04/2019, 7:06 PM

## 2019-03-04 NOTE — Progress Notes (Signed)
Pt had eLink visit with family.

## 2019-03-04 NOTE — Progress Notes (Signed)
OT Cancellation Note  Patient Details Name: Justin Atkinson. MRN: [id number] DOB: Oct 20, 1940   Cancelled Treatment:    Reason Eval/Treat Not Completed: Patient not medically ready  Rhyder Bratz,HILLARY 03/04/2019, 8:59 AM  Maurie Boettcher, OT/L   Acute OT Clinical Specialist [place] Pager [phone] Office [phone]

## 2019-03-04 NOTE — Progress Notes (Signed)
Pt becoming more dry and unable to get secretions out well. Heated high flow weaned down from 60L 100%  to 30L 100% for pt comfort/as tolerated. SpO2 currently 94%. NRB remains overtop. Dr. [name] notified, RT will continue to monitor.

## 2019-03-05 LAB — GLUCOSE, CAPILLARY
Glucose-Capillary: 160 mg/dL — ABNORMAL HIGH (ref 70–99)
Glucose-Capillary: 308 mg/dL — ABNORMAL HIGH (ref 70–99)
Glucose-Capillary: 247 mg/dL — ABNORMAL HIGH (ref 70–99)

## 2019-03-05 LAB — CBC WITH DIFFERENTIAL/PLATELET
Abs Immature Granulocytes: 0.06 10*3/uL (ref 0.00–0.07)
Basophils Absolute: 0 10*3/uL (ref 0.0–0.1)
Basophils Relative: 0 %
Eosinophils Absolute: 0.3 10*3/uL (ref 0.0–0.5)
Eosinophils Relative: 2 %
HCT: 48.7 % (ref 39.0–52.0)
Hemoglobin: 15.8 g/dL (ref 13.0–17.0)
Immature Granulocytes: 1 %
Lymphocytes Relative: 7 %
Lymphs Abs: 0.8 10*3/uL (ref 0.7–4.0)
MCH: 30.4 pg (ref 26.0–34.0)
MCHC: 32.4 g/dL (ref 30.0–36.0)
MCV: 93.8 fL (ref 80.0–100.0)
Monocytes Absolute: 0.4 10*3/uL (ref 0.1–1.0)
Monocytes Relative: 4 %
Neutro Abs: 9.9 10*3/uL — ABNORMAL HIGH (ref 1.7–7.7)
Neutrophils Relative %: 86 %
Platelets: 323 10*3/uL (ref 150–400)
RBC: 5.19 MIL/uL (ref 4.22–5.81)
RDW: 13.4 % (ref 11.5–15.5)
WBC: 11.4 10*3/uL — ABNORMAL HIGH (ref 4.0–10.5)
nRBC: 0 % (ref 0.0–0.2)

## 2019-03-05 LAB — COMPREHENSIVE METABOLIC PANEL
ALT: 50 U/L — ABNORMAL HIGH (ref 0–44)
AST: 28 U/L (ref 15–41)
Albumin: 3.3 g/dL — ABNORMAL LOW (ref 3.5–5.0)
Alkaline Phosphatase: 104 U/L (ref 38–126)
Anion gap: 14 (ref 5–15)
BUN: 55 mg/dL — ABNORMAL HIGH (ref 8–23)
CO2: 26 mmol/L (ref 22–32)
Calcium: 9 mg/dL (ref 8.9–10.3)
Chloride: 103 mmol/L (ref 98–111)
Creatinine, Ser: 1.12 mg/dL (ref 0.61–1.24)
GFR calc Af Amer: >60 mL/min (ref >60)
GFR calc non Af Amer: >60 mL/min (ref >60)
Glucose, Bld: 178 mg/dL — ABNORMAL HIGH (ref 70–99)
Potassium: 3.9 mmol/L (ref 3.5–5.1)
Sodium: 143 mmol/L (ref 135–145)
Total Bilirubin: 1 mg/dL (ref 0.3–1.2)
Total Protein: 6.6 g/dL (ref 6.5–8.1)

## 2019-03-05 LAB — C-REACTIVE PROTEIN: CRP: 1 mg/dL — ABNORMAL HIGH (ref <1.0)

## 2019-03-05 LAB — PHOSPHORUS: Phosphorus: 4.5 mg/dL (ref 2.5–4.6)

## 2019-03-05 LAB — D-DIMER, QUANTITATIVE: D-Dimer, Quant: 2.01 ug/mL-FEU — ABNORMAL HIGH (ref 0.00–0.50)

## 2019-03-05 LAB — FERRITIN: Ferritin: 467 ng/mL — ABNORMAL HIGH (ref 24–336)

## 2019-03-05 LAB — MAGNESIUM: Magnesium: 2.6 mg/dL — ABNORMAL HIGH (ref 1.7–2.4)

## 2019-03-05 LAB — BRAIN NATRIURETIC PEPTIDE: B Natriuretic Peptide: 116.4 pg/mL — ABNORMAL HIGH (ref 0.0–100.0)

## 2019-03-05 MED ORDER — POLYETHYLENE GLYCOL 3350 17 G PO PACK
17.0000 g | PACK | Freq: Every day | ORAL | Status: DC | PRN
  Administered 2019-03-10: 17 g via ORAL
  Filled 2019-03-05 (×2): qty 1

## 2019-03-05 MED ORDER — POLYETHYLENE GLYCOL 3350 17 G PO PACK
17.0000 g | PACK | Freq: Two times a day (BID) | ORAL | Status: AC
  Administered 2019-03-05 (×2): 17 g via ORAL
  Filled 2019-03-05 (×2): qty 1

## 2019-03-05 MED ORDER — FUROSEMIDE 10 MG/ML IJ SOLN
40.0000 mg | Freq: Four times a day (QID) | INTRAMUSCULAR | Status: AC
  Administered 2019-03-05 (×2): 40 mg via INTRAVENOUS
  Filled 2019-03-05 (×2): qty 4

## 2019-03-05 MED ORDER — BISACODYL 5 MG PO TBEC
10.0000 mg | DELAYED_RELEASE_TABLET | Freq: Once | ORAL | Status: AC
  Administered 2019-03-05: 13:00:00 10 mg via ORAL
  Filled 2019-03-05: qty 2

## 2019-03-05 MED ORDER — METOPROLOL TARTRATE 5 MG/5ML IV SOLN
2.5000 mg | Freq: Four times a day (QID) | INTRAVENOUS | Status: DC
  Administered 2019-03-05: 2.5 mg via INTRAVENOUS
  Filled 2019-03-05: qty 5

## 2019-03-05 NOTE — Plan of Care (Signed)
  Problem: Health Behavior/Discharge Planning: Goal: Ability to manage health-related needs will improve Outcome: Progressing   Problem: Skin Integrity: Goal: Risk for impaired skin integrity will decrease Outcome: Progressing  Problem: Elimination: Goal: Will not experience complications related to bowel motility Outcome: Progressing   Problem: Coping: Goal: Level of anxiety will decrease Outcome: Progressing

## 2019-03-05 NOTE — Progress Notes (Signed)
  [facility] PCCM  Discussed on round with Dr. Waldron Labs.  No major changes in status overnight. Continue current management.    Roselie Awkward, MD [place] PCCM Pager: [phone] Cell: [phone] If no response, call [phone]

## 2019-03-05 NOTE — Progress Notes (Signed)
Inpatient Diabetes Program Recommendations  AACE/ADA: New Consensus Statement on Inpatient Glycemic Control (2015)  Target Ranges:  Prepandial:   less than 140 mg/dL      Peak postprandial:   less than 180 mg/dL (1-2 hours)      Critically ill patients:  140 - 180 mg/dL   Lab Results  Component Value Date   GLUCAP 308 (H) 03/05/2019   HGBA1C 7.0 (H) 02/28/2019    Review of Glycemic Control Results for NORRIS, WEY (MRN [id number]) as of 03/05/2019 12:30  Ref. Range 03/04/2019 08:13 03/04/2019 12:42 03/04/2019 16:38 03/04/2019 20:10 03/05/2019 07:51 03/05/2019 11:44  Glucose-Capillary Latest Ref Range: 70 - 99 mg/dL 187 (H) 195 (H) 246 (H) 225 (H) 160 (H) 308 (H)   Diabetes history: DM 2 Outpatient Diabetes medications: Metformin 500 mg Daily Current orders for Inpatient glycemic control:  Levemir 18 units qhs Novolog 0-20 units tid Novolog 6 units tid meal coverage  Solumedrol 30 mg Daily BUN/Creat: 55/1.12 No PO supplementation currently  Inpatient Diabetes Program Recommendations:    Note Solumedrol dose cut in half.  Glucose trends increase after meal intake and steroid dose in the mornings. May consider increasing Novolog meal coverage.  Thanks,  Tama Headings RN, MSN, BC-ADM Inpatient Diabetes Coordinator Team Pager [phone] (8a-5p)

## 2019-03-05 NOTE — Evaluation (Signed)
Physical Therapy Evaluation Patient Details Name: Justin Atkinson. MRN: [id number] DOB: Aug 24, 1940 Today's Date: 03/05/2019   History of Present Illness  78 y.o. male with medical history significant of COPD/pulmonary fibrosis was on home O2 however subsequently discontinued over the past few weeks as hypoxia had improved, recent bronchoscopy, right thoracoscopy, wedge resection of right lung February 09 2019, cervical degenerative disc disease, hypertension, diabetes, hyperlipidemia who presents to the ED with a 10-day history of increasing productive cough, shortness of breath, chills. Tested COVID +.   Clinical Impression  Pt was stable enough this PM to get up OOB to the recliner chair.  We had to briefly take his NRB mask off for the transfer because the line was not long enough until the bed was moved out of the way.  He dropped into the 60s, but with quick re-application of NRB on top of HHFNC his sats improved to the upper 90s within 3-5 mins.  His cognition has significantly improved compared to last two days per chart review and RN reports.  He tolerated UE breathing/arm exercise.  He is eager to get better and go home with his wife.  PT will continue to follow acutely for safe mobility progression.  Follow Up Recommendations CIR    Equipment Recommendations  Rolling walker with 5" wheels;3in1 (PT);Other (comment)(home O2?)    Recommendations for Other Services Rehab consult     Precautions / Restrictions Precautions Precautions: Fall;Other (comment) Precaution Comments: on lots of O2 desats very fast      Mobility  Bed Mobility Overal bed mobility: Needs Assistance Bed Mobility: Supine to Sit     Supine to sit: Min assist;HOB elevated     General bed mobility comments: heavy min hand held assist to come to sitting EOB.  support at trunk as well as pt has not been up OOB for days now.    Transfers Overall transfer level: Needs assistance Equipment used:  None Transfers: Sit to/from [facility] Sit to Stand: Min assist;+2 safety/equipment Stand pivot transfers: Min assist;+2 safety/equipment       General transfer comment: Two person heavy min assist to stand and take pivotal steps to chair.  NRB mask breifly removed (the line was too short) during transfer and re-applied quickly after moving the bed and sitting down in the chair.  With just that effort, pt desated to 68%, however, rebounded into the 80s nicely with seated rest, re-application of NRB ([id number] 100% FiO2) in addition to HFNC 10+L).  O2 measured via earlobe probe.   Ambulation/Gait             General Gait Details: unable to yet, but did well with OOB to chair.                Pertinent Vitals/Pain Pain Assessment: No/denies pain    Home Living Family/patient expects to be discharged to:: Private residence Living Arrangements: Spouse/significant other Available Help at Discharge: Family;Personal care attendant;Available PRN/intermittently Type of Home: House Home Access: Level entry     Home Layout: Two level;Able to live on main level with bedroom/bathroom Home Equipment: None(wife has equipment) Additional Comments: Wife has MS and has PCA to assist her with BADLs and transfers    Prior Function Level of Independence: Independent         Comments: pt limited community ambulator 2/2 back pain from DDD     Hand Dominance        Extremity/Trunk Assessment   Upper Extremity Assessment Upper Extremity  Assessment: Defer to OT evaluation    Lower Extremity Assessment Lower Extremity Assessment: Generalized weakness    Cervical / Trunk Assessment Cervical / Trunk Assessment: Other exceptions Cervical / Trunk Exceptions: h/o DDD  Communication   Communication: No difficulties  Cognition Arousal/Alertness: Awake/alert Behavior During Therapy: WFL for tasks assessed/performed                                    General Comments: Not specifically tested, per RN he is much more cognitively alert, oriented compared to yesterday.          Exercises General Exercises - Upper Extremity Shoulder Flexion: AROM;Both;Limitations;5 reps Shoulder Flexion Limitations: inhale with shoulder flexion, exhale with hands in lap General Exercises - Lower Extremity Ankle Circles/Pumps: AROM;Both;20 reps   Assessment/Plan    PT Assessment Patient needs continued PT services  PT Problem List Decreased activity tolerance;Decreased strength;Decreased knowledge of use of DME;Cardiopulmonary status limiting activity       PT Treatment Interventions Gait training;Stair training;Functional mobility training;Therapeutic exercise;Therapeutic activities;Balance training;Neuromuscular re-education;Patient/family education    PT Goals (Current goals can be found in the Care Plan section)  Acute Rehab PT Goals Patient Stated Goal: "Go home to my wife" PT Goal Formulation: With patient Time For Goal Achievement: 03/19/19 Potential to Achieve Goals: Good    Frequency Min 3X/week           AM-PAC PT "6 Clicks" Mobility  Outcome Measure Help needed turning from your back to your side while in a flat bed without using bedrails?: A Little Help needed moving from lying on your back to sitting on the side of a flat bed without using bedrails?: A Little Help needed moving to and from a bed to a chair (including a wheelchair)?: A Little Help needed standing up from a chair using your arms (e.g., wheelchair or bedside chair)?: A Little Help needed to walk in hospital room?: A Lot Help needed climbing 3-5 steps with a railing? : Total 6 Click Score: 15    End of Session Equipment Utilized During Treatment: Oxygen(NRB and HHFNC) Activity Tolerance: Other (comment)(limited by DOE and decreased sats) Patient left: in chair;with call bell/phone within reach;with nursing/sitter in room   PT Visit Diagnosis: Muscle weakness  (generalized) (M62.81);Difficulty in walking, not elsewhere classified (R26.2)    Time: 1630-1700 PT Time Calculation (min) (ACUTE ONLY): 30 min   Charges:         Verdene Lennert, PT, DPT  Acute Rehabilitation [phone] pager #[phone] office  @ [facility]: [phone]   PT Evaluation $PT Eval Moderate Complexity: 1 Mod PT Treatments $Therapeutic Activity: 8-22 mins       03/05/2019, 6:15 PM

## 2019-03-05 NOTE — Progress Notes (Signed)
Spouse and daughter updated via phone on patient and plan of care. All questions answered at present time. Family requested to facetime with patient tomorrow (03/05/2019).

## 2019-03-05 NOTE — Progress Notes (Signed)
Rehab Admissions Coordinator Note:  Patient was screened by Cleatrice Burke for appropriateness for an Inpatient Acute Rehab Consult per PT recommendation. I will follow his progress as his is able to participate more with therapy. COVID positive 11/27. We would have to await pt to meet post COVID guidelines before consideration for a possible CIR admit.   Cleatrice Burke RN MSN 03/05/2019, 8:01 PM  I can be reached at [phone].

## 2019-03-05 NOTE — Progress Notes (Signed)
Patient ambulated from [facility] to bed, O2 sats dropped to 59%, breathing was labored, FIO2 on HHFNC increased to 100%  and NRB placed on patient.

## 2019-03-05 NOTE — Progress Notes (Signed)
PROGRESS NOTE                                                                                                                                                                                                             Patient Demographics:    Justin Atkinson, is a 78 y.o. male, DOB - 06/16/1940, EXH:[id number]  Admit date - 02/08/2019   Admitting Physician Eugenie Filler, MD  Outpatient Primary MD for the patient is [name], MD  LOS - 6   No chief complaint on file.      Brief Narrative    78 year old  WM PMHx COPD/pulmonary fibrosis recently diagnosed hypersensitivity pneumonitis (had VATS wedge resection on 11/9,follows with Dr. Vaughan Browner) who was recently started on steroid on 11/18, HTN, diabetes type 2 uncontrolled with complication, HLD, and DJD . -Patient admitted to ICU given profound hypoxia, still requiring heated high flow oxygen together with nonrebreather as well, received Actemra, plasma, treated with remdesivir and steroids, patient remains in ICU, still with significant permanent, with no improvement.   Subjective:    Justin Atkinson with no significant events overnight, he remains on Precedex, patient with no bowel movement per nursing staff.   Assessment  & Plan :    Principal Problem:   Acute respiratory failure with hypoxia (HCC) Active Problems:   Hypertension   Hypercholesterolemia   Benign prostate hyperplasia   ILD (interstitial lung disease) (HCC)   Postinflammatory pulmonary fibrosis (HCC)   HCAP (healthcare-associated pneumonia)   Chronic obstructive pulmonary disease (HCC)   Pneumonia due to COVID-19 virus   Severe sepsis (HCC)   Essential hypertension   Diabetes mellitus type 2, controlled, with complications ([place])   Pulmonary fibrosis (HCC)   Hypersensitivity pneumonitis ([place])   Acute hypoxic respiratory failure due to COVID-19 pneumonia -Patient remains with significant hypoxia, he is on 3 L high flow  heated nasal cannula this morning, and NRB  . -Currently DNR,per PCCM D/W family. -Remains in ICU. -Received convalescent plasma 11/30 -Received remdesivir x5 days -Received Actemra 11/29 -Patient still showing minimal signs of improvement, will continue with current management which is IV steroids,recent diagnosis of hypersensitivity pneumonitis, continue with IV Solu-Medrol 30 mg daily. . -Continue to trend inflammatory markers closely, CRP is trending down . -Patient with some improvement overnight with IV diuresis, will continue with Lasix as needed, will  keep on Lasix 40 mg IV every 6 hours x2 doses today, will monitor renal function closely especially patient has only one kidney left.    COVID-19 Labs  Recent Labs    03/03/19 0450 03/04/19 0520 03/05/19 0350  DDIMER 1.92* 1.99* 2.01*  FERRITIN 484* 449* 467*  LDH 574* 558*  --   CRP 3.2* 1.8* 1.0*    Lab Results  Component Value Date   SARSCOV2NAA POSITIVE (A) 02/11/2019   SARSCOV2NAA NOT DETECTED 02/05/2019   [place] NOT DETECTED 01/09/2019   Hypersensivitivy pneumonitis/ Hx of ILD -Following with pulmonary Dr. Vaughan Browner as an outpatient -Diagnosed with recent biopsy, continue with IV steroids - Prednisone 77m daily at discharge until seen in pulmonary clinic per PCCM. negative 4.6 L since admission  Hypertension -Blood pressure on the lower side today, will discontinue irbesartan, continue hydralazine, continue metoprolol.  Hyperlipidemia -Continue with statin  Acute metabolic encephalopathy/hospital delirium -Patient with significant agitation/confusion, where to be on Precedex drip, agitation is currently controlled, will try to wean off drip, is already on Seroquel nightly and as needed Haldol.  Diabetes mellitus controlled -11/28 hemoglobin A1c= 7.0 -Better controlled after increasing Levemir to 18 units nightly, continue with assistance sliding scale. -12/1 increase Levemir 18 units qhs -12/1 increase  NovoLog 6 units qac  Code Status : DNR,   Family Communication  : Discussed with daughter and wife today.   Disposition Plan  : remains in ICU  Consults  :  PCCM  Procedures  : None  DVT Prophylaxis  :  [place] lovenox  Lab Results  Component Value Date   PLT 323 03/05/2019    Antibiotics  :    Anti-infectives (From admission, onward)   Start     Dose/Rate Route Frequency Ordered Stop   03/01/19 0800  remdesivir 100 mg in sodium chloride 0.9 % 250 mL IVPB     100 mg 500 mL/hr over 30 Minutes Intravenous Every 24 hours 02/28/19 0823 03/04/19 1216   02/28/19 1000  remdesivir 200 mg in sodium chloride 0.9 % 250 mL IVPB     200 mg 500 mL/hr over 30 Minutes Intravenous Once 02/28/19 0823 02/28/19 1006   02/28/19 0000  vancomycin (VANCOCIN) 1,250 mg in sodium chloride 0.9 % 250 mL IVPB  Status:  Discontinued     1,250 mg 166.7 mL/hr over 90 Minutes Intravenous Every 12 hours 03/02/2019 1639 02/19/2019 2037   02/12/2019 2200  ceFEPIme (MAXIPIME) 2 g in sodium chloride 0.9 % 100 mL IVPB  Status:  Discontinued     2 g 200 mL/hr over 30 Minutes Intravenous Every 8 hours 02/03/2019 1629 03/03/19 1242   02/20/2019 1315  ceFEPIme (MAXIPIME) 2 g in sodium chloride 0.9 % 100 mL IVPB     2 g 200 mL/hr over 30 Minutes Intravenous  Once 02/07/2019 1306 02/28/19 0324   02/06/2019 1315  vancomycin (VANCOCIN) 2,000 mg in sodium chloride 0.9 % 500 mL IVPB     2,000 mg 250 mL/hr over 120 Minutes Intravenous  Once 02/23/2019 1311 02/28/19 0324        Objective:   Vitals:   03/05/19 0600 03/05/19 0700 03/05/19 0736 03/05/19 1000  BP: 99/65 114/61  126/75  Pulse: (!) 56 (!) 59  60  Resp: (!) '21 17 20 ' (!) 29  Temp:    98.1 F (36.7 C)  TempSrc:    Axillary  SpO2: 91% (!) 88%  93%  Weight:      Height:  Wt Readings from Last 3 Encounters:  02/28/19 130.7 kg  02/20/19 (!) 137 kg  02/18/19 (!) 137 kg     Intake/Output Summary (Last 24 hours) at 03/05/2019 1041 Last data filed at 03/05/2019  1000 Gross per 24 hour  Intake 1095.83 ml  Output 3775 ml  Net -2679.17 ml     Physical Exam  Awake , frail, ill-appearing, confused and easily distracted, but able to communicate and answer some questions appropriately Symmetrical chest wall movement, crackles has improved, no wheezing RRR,No Gallops,Rubs or new Murmurs, No Parasternal Heave +ve B.Sounds, Abd Soft, No tenderness, No rebound - guarding or rigidity. No Cyanosis, Clubbing or edema, No new Rash or bruise       Data Review:    CBC Recent Labs  Lab 02/28/19 0229 03/01/19 0333 03/02/19 0455 03/02/19 0649 03/03/19 0450 03/04/19 0520 03/05/19 0350  WBC 8.3 13.2* 10.6*  --  12.1* 11.3* 11.4*  HGB 13.6 14.2 14.0 13.9 14.5 15.6 15.8  HCT 42.6 44.8 43.6 41.0 44.9 48.4 48.7  PLT 272 362 363  --  361 356 323  MCV 96.4 95.1 94.6  --  94.7 94.2 93.8  MCH 30.8 30.1 30.4  --  30.6 30.4 30.4  MCHC 31.9 31.7 32.1  --  32.3 32.2 32.4  RDW 13.3 13.3 13.4  --  13.3 13.3 13.4  LYMPHSABS 0.4*  --  0.4*  --  0.5* 0.5* 0.8  MONOABS 0.3  --  0.5  --  0.3 0.5 0.4  EOSABS 0.0  --  0.0  --  0.0 0.0 0.3  BASOSABS 0.0  --  0.0  --  0.0 0.0 0.0    Chemistries  Recent Labs  Lab 03/01/19 0333 03/01/19 1220 03/02/19 0455 03/02/19 0649 03/03/19 0450 03/04/19 0520 03/05/19 0350  NA 141  --  142 141 142 144 143  K 4.1  --  4.8 4.4 4.7 4.5 3.9  CL 105  --  106  --  106 106 103  CO2 24  --  25  --  '24 25 26  ' GLUCOSE 241*  --  281*  --  213* 184* 178*  BUN 31*  --  37*  --  35* 42* 55*  CREATININE 0.92  --  0.92  --  0.84 0.89 1.12  CALCIUM 9.1  --  8.9  --  9.1 9.4 9.0  MG  --  2.2 2.6*  --  2.4 2.6* 2.6*  AST 42*  --  30  --  34 25 28  ALT 63*  --  51*  --  55* 53* 50*  ALKPHOS 88  --  93  --  105 115 104  BILITOT 0.8  --  1.0  --  1.0 0.9 1.0   ------------------------------------------------------------------------------------------------------------------ No results for input(s): CHOL, HDL, LDLCALC, TRIG, CHOLHDL,  LDLDIRECT in the last 72 hours.  Lab Results  Component Value Date   HGBA1C 7.0 (H) 02/28/2019   ------------------------------------------------------------------------------------------------------------------ No results for input(s): TSH, T4TOTAL, T3FREE, THYROIDAB in the last 72 hours.  Invalid input(s): FREET3 ------------------------------------------------------------------------------------------------------------------ Recent Labs    03/04/19 0520 03/05/19 0350  FERRITIN 449* 467*    Coagulation profile No results for input(s): INR, PROTIME in the last 168 hours.  Recent Labs    03/04/19 0520 03/05/19 0350  DDIMER 1.99* 2.01*    Cardiac Enzymes No results for input(s): CKMB, TROPONINI, MYOGLOBIN in the last 168 hours.  Invalid input(s): CK ------------------------------------------------------------------------------------------------------------------    Component Value Date/Time   BNP 116.4 (  H) 03/05/2019 0350    Inpatient Medications  Scheduled Meds:  aspirin EC  81 mg Oral Daily   Chlorhexidine Gluconate Cloth  6 each Topical Daily   enoxaparin (LOVENOX) injection  60 mg Subcutaneous BID   fluticasone  2 spray Each Nare Daily   furosemide  40 mg Intravenous Q6H   guaiFENesin  1,200 mg Oral BID   hydrALAZINE  25 mg Oral Q8H   insulin aspart  0-20 Units Subcutaneous TID WC   insulin aspart  6 Units Subcutaneous TID WC   insulin detemir  18 Units Subcutaneous QHS   irbesartan  75 mg Oral Daily   latanoprost  1 drop Both Eyes QHS   loratadine  10 mg Oral Daily   mouth rinse  15 mL Mouth Rinse BID   methylPREDNISolone (SOLU-MEDROL) injection  30 mg Intravenous Daily   metoprolol tartrate  5 mg Intravenous Q6H   omega-3 acid ethyl esters  1 g Oral Daily   pantoprazole  40 mg Oral Daily   QUEtiapine  50 mg Oral QHS   rosuvastatin  10 mg Oral Daily   timolol  1 drop Left Eye BID   traZODone  25 mg Oral QHS    umeclidinium-vilanterol  1 puff Inhalation Daily   Continuous Infusions:  dexmedetomidine (PRECEDEX) IV infusion 0.1 mcg/kg/hr (03/05/19 0700)   PRN Meds:.acetaminophen, albuterol, ALPRAZolam, haloperidol lactate, morphine injection, ondansetron (ZOFRAN) IV, sodium chloride  Micro Results Recent Results (from the past 240 hour(s))  Blood culture (routine x 2)     Status: None   Collection Time: 02/25/2019 11:48 AM   Specimen: BLOOD  Result Value Ref Range Status   Specimen Description   Final    BLOOD RIGHT ANTECUBITAL Performed at [facility], [place] [address] [place], [place] [address]    Special Requests   Final    BOTTLES DRAWN AEROBIC AND ANAEROBIC Blood Culture results may not be optimal due to an excessive volume of blood received in culture bottles Performed at [place] [address], [place], [place] [address]    Culture   Final    NO GROWTH 5 DAYS Performed at [facility], [place] [address] [place], [place] [address]    Report Status 03/04/2019 FINAL  Final  Blood culture (routine x 2)     Status: None   Collection Time: 02/12/2019 11:50 AM   Specimen: BLOOD RIGHT HAND  Result Value Ref Range Status   Specimen Description   Final    BLOOD RIGHT HAND Performed at [facility] [address], [place], [place] [address]    Special Requests   Final    BOTTLES DRAWN AEROBIC AND ANAEROBIC Blood Culture results may not be optimal due to an excessive volume of blood received in culture bottles Performed at [facility] [address] [place], [place] [address]    Culture   Final    NO GROWTH 5 DAYS Performed at [facility], [place] [address] [place], [place] [address]    Report Status 03/04/2019 FINAL  Final  SARS CORONAVIRUS 2 (TAT 6-24 HRS) Nasopharyngeal Nasopharyngeal Swab     Status: Abnormal   Collection Time: 02/26/2019  2:00 PM   Specimen: Nasopharyngeal Swab  Result Value  Ref Range Status   SARS Coronavirus 2 POSITIVE (A) NEGATIVE Final    Comment: RESULT CALLED TO, READ BACK BY AND VERIFIED WITH: K.GARCIA RN 1950 02/15/2019 [place] K (NOTE) SARS-CoV-2 target nucleic  acids are DETECTED. The SARS-CoV-2 RNA is generally detectable in upper and lower respiratory specimens during the acute phase of infection. Positive results are indicative of the presence of SARS-CoV-2 RNA. Clinical correlation with patient history and other diagnostic information is  necessary to determine patient infection status. Positive results do not rule out bacterial infection or co-infection with other viruses.  The expected result is Negative. Fact Sheet for Patients: SugarRoll.be Fact Sheet for Healthcare Providers: https://www.woods-mathews.com/ This test is not yet approved or cleared by the [place] FDA and  has been authorized for detection and/or diagnosis of SARS-CoV-2 by FDA under an Emergency Use Authorization (EUA). This EUA will remain  in effect (meaning this test can be used) for  the duration of the COVID-19 declaration under Section 564(b)(1) of the Act, 21 U.S.C. section 360bbb-3(b)(1), unless the authorization is terminated or revoked sooner. Performed at [facility], [place] [address] [place], [place] [address]   Culture, sputum-assessment     Status: None   Collection Time: 02/25/2019  4:04 PM   Specimen: Sputum  Result Value Ref Range Status   Specimen Description SPUTUM  Final   Special Requests NONE  Final   Sputum evaluation   Final    Sputum specimen not acceptable for testing.  Please recollect.   RESULT CALLED TO, READ BACK BY AND VERIFIED WITH: I [facility] 02/28/19 2116 RHOLMES Performed at [facility], [place] [address], [place], [place] [address]    Report Status 02/28/2019 FINAL  Final  Respiratory Panel by PCR     Status: None   Collection Time: 02/14/2019  6:45 PM    Specimen: Flu Kit Nasopharyngeal Swab; Respiratory  Result Value Ref Range Status   Adenovirus NOT DETECTED NOT DETECTED Final   Coronavirus 229E NOT DETECTED NOT DETECTED Final    Comment: (NOTE) The Coronavirus on the Respiratory Panel, DOES NOT test for the novel  Coronavirus (2019 nCoV)    Coronavirus HKU1 NOT DETECTED NOT DETECTED Final   Coronavirus NL63 NOT DETECTED NOT DETECTED Final   Coronavirus OC43 NOT DETECTED NOT DETECTED Final   Metapneumovirus NOT DETECTED NOT DETECTED Final   Rhinovirus / Enterovirus NOT DETECTED NOT DETECTED Final   Influenza A NOT DETECTED NOT DETECTED Final   Influenza B NOT DETECTED NOT DETECTED Final   Parainfluenza Virus 1 NOT DETECTED NOT DETECTED Final   Parainfluenza Virus 2 NOT DETECTED NOT DETECTED Final   Parainfluenza Virus 3 NOT DETECTED NOT DETECTED Final   Parainfluenza Virus 4 NOT DETECTED NOT DETECTED Final   Respiratory Syncytial Virus NOT DETECTED NOT DETECTED Final   Bordetella pertussis NOT DETECTED NOT DETECTED Final   Chlamydophila pneumoniae NOT DETECTED NOT DETECTED Final   Mycoplasma pneumoniae NOT DETECTED NOT DETECTED Final    Comment: Performed at [facility], [place]. [address], [place], [place] [address]  MRSA PCR Screening     Status: None   Collection Time: 02/05/2019  6:45 PM   Specimen: Nasal Mucosa; Nasopharyngeal  Result Value Ref Range Status   MRSA by PCR NEGATIVE NEGATIVE Final    Comment:        The GeneXpert MRSA Assay (FDA approved for NASAL specimens only), is one component of a comprehensive MRSA colonization surveillance program. It is not intended to diagnose MRSA infection nor to guide or monitor treatment for MRSA infections. Performed at [facility], [place] [address] [place], [place] [address]   Urine culture     Status: None  Collection Time: 02/15/2019  7:15 PM   Specimen: Urine, Random  Result Value Ref Range Status   Specimen Description   Final    URINE,  RANDOM Performed at [place] [address] [place], [place] [address]    Special Requests   Final    NONE Performed at [facility], [place] [address], [place], [place] [address]    Culture   Final    NO GROWTH Performed at [facility], [place] [address], [place], [place] [address]    Report Status 03/01/2019 FINAL  Final    Radiology Reports Dg Chest 2 View  Result Date: 02/20/2019 CLINICAL DATA:  Interstitial lung disease, COPD, diabetes mellitus, hypertension EXAM: CHEST - 2 VIEW COMPARISON:  02/11/2019, 02/09/2019 FINDINGS: Enlargement of cardiac silhouette. Mediastinal contours and pulmonary vascularity normal. Mild accentuation of interstitial markings in the periphery of both lungs. No acute infiltrate, pleural effusion or pneumothorax. Osseous structures unremarkable. IMPRESSION: Peripheral interstitial prominence in the lungs bilaterally, slightly greater at bases. No definite acute infiltrate. Mild enlargement of cardiac silhouette. Electronically Signed   By: Lavonia Dana M.D.   On: 02/20/2019 13:08   Dg Chest 2 View  Result Date: 02/09/2019 CLINICAL DATA:  Pre right lung biopsy evaluation. Pulmonary fibrosis. Ex-smoker. EXAM: CHEST - 2 VIEW COMPARISON:  Chest CT dated 09/22/2018. Chest radiographs dated 07/25/2012 FINDINGS: The cardiac silhouette is borderline enlarged. The aorta is tortuous. Stable minimal linear scarring at the left lung base with interval minimal linear scarring in the left mid lung zone. Mild diffuse peribronchial thickening with improvement. Thoracic and upper lumbar spine degenerative changes. IMPRESSION: 1. No acute abnormality. 2. Mild chronic bronchitic changes. Electronically Signed   By: Claudie Revering M.D.   On: 02/09/2019 09:32   Ct Angio Chest Pe W And/or Wo Contrast  Result Date: 02/25/2019 CLINICAL DATA:  PE suspected, intermediate prob, neg D-dimer. Cough and shortness of breath. Recent VATS lung  biopsy 02/09/2019. EXAM: CT ANGIOGRAPHY CHEST WITH CONTRAST TECHNIQUE: Multidetector CT imaging of the chest was performed using the standard protocol during bolus administration of intravenous contrast. Multiplanar CT image reconstructions and MIPs were obtained to evaluate the vascular anatomy. CONTRAST:  137m OMNIPAQUE IOHEXOL 350 MG/ML SOLN COMPARISON:  Radiographs earlier this day. Chest CT 09/22/2018 FINDINGS: Cardiovascular: Contrast bolus timing and breathing motion artifact limits detailed assessment. No central filling defects in the pulmonary arteries, evaluation is diagnostic to the lobar level. There is opacification of multiple segmental and subsegmental branches, however more detailed assessment is limited. Multi chamber cardiomegaly. Minor aortic atherosclerosis without aneurysm. No aortic dissection. No pericardial effusion. There is contrast refluxing into the hepatic veins and IVC. Mediastinum/Nodes: No enlarged mediastinal, hilar, or axillary lymph nodes. Patulous esophagus without wall thickening. No visualized thyroid nodule. Lungs/Pleura: Ground-glass opacities, fairly diffuse throughout the left lung and involving the right lower lobe. Patchy involvement of the right upper and right middle lobes. Findings are new from prior exam. There is no definite septal thickening. No pneumothorax or confluent airspace disease. No significant pleural fluid. Trachea and mainstem bronchi are patent. Pulmonary nodules on prior exam are obscured by motion. Emphysema. Upper Abdomen: Contrast refluxes into the hepatic veins and IVC. Right renal cyst partially included. Suspected hepatic steatosis. Musculoskeletal: Subcutaneous emphysema throughout the right chest wall which tracks posteriorly and anteriorly chest to the left of midline. This is likely related to recent VATS. Unchanged rounded sclerotic density within T10. degenerative change in the spine. Review of the MIP  images confirms the above findings.  IMPRESSION: 1. Ground-glass opacities throughout both lungs, diffuse on the left and more patchy in geographic on the right. Findings may represent asymmetric pulmonary edema or infection, including atypical viral organisms. 2. Mild cardiomegaly. Reflux of contrast into the hepatic veins and IVC consistent with elevated right heart pressures. 3. Limited assessment for pulmonary embolus given contrast bolus timing and patient motion, no large central pulmonary embolism. 4. Subcutaneous emphysema in the right chest wall likely related to recent VATS. No pneumothorax. Aortic Atherosclerosis (ICD10-I70.0). Emphysema (ICD10-J43.9). Electronically Signed   By: Keith Rake M.D.   On: 02/28/2019 17:08   Dg Chest Port 1 View  Result Date: 02/23/2019 CLINICAL DATA:  Cough dyspnea. EXAM: PORTABLE CHEST 1 VIEW COMPARISON:  CT chest 09/22/2018, chest x-ray of February 20, 2019 FINDINGS: Heart size is enlarged as before. Bibasilar opacities have developed since the 02/20/2019 with dense retrocardiac opacification and with more interstitial and subtle alveolar opacities in the right chest. Patient reportedly had a lung biopsy. There is no visible pneumothorax on this semi erect view. Subcutaneous emphysema is noted along the right lateral chest. No acute bone finding. IMPRESSION: 1. Findings that may represent multifocal pneumonia or asymmetric edema. 2. Consolidation or volume loss at the left lung base greater than right. 3. Pulmonary hemorrhage considered on the right given the patient's history of recent right-sided biopsy. 4. No signs of pneumothorax on semi erect view though there is subcutaneous emphysema along the right chest wall. Occult pneumothorax is not excluded. Electronically Signed   By: Zetta Bills M.D.   On: 02/12/2019 11:35   Dg Chest Port 1 View  Result Date: 02/11/2019 CLINICAL DATA:  Chest tube removal EXAM: PORTABLE CHEST 1 VIEW COMPARISON:  February 10, 2019 FINDINGS: Interval removal of  right chest tube with new right chest wall subcutaneous emphysema. No definite pneumothorax. Low lung volumes with bibasilar atelectasis. Stable cardiomediastinal contours. IMPRESSION: Post right chest tube removal with new subcutaneous emphysema. No definite pneumothorax. Electronically Signed   By: Macy Mis M.D.   On: 02/11/2019 08:14   Dg Chest Port 1 View  Result Date: 02/10/2019 CLINICAL DATA:  Patient status post right upper, middle and lower lobe wedge resection 02/09/2019. Chest tube in place. EXAM: PORTABLE CHEST 1 VIEW COMPARISON:  Single-view of the chest 02/09/2019. FINDINGS: Right chest tube remains in place. No pneumothorax. Subsegmental atelectasis in the right lung base has increased. The left lung is clear. Heart size is normal. IMPRESSION: Negative for pneumothorax with a right chest tube in place. Increased subsegmental atelectasis right lung base. Electronically Signed   By: Inge Rise M.D.   On: 02/10/2019 06:43   Dg Chest Port 1 View  Result Date: 02/09/2019 CLINICAL DATA:  Follow-up lung surgery EXAM: PORTABLE CHEST 1 VIEW COMPARISON:  02/09/2019 FINDINGS: Insertion of right-sided chest tube with tip at the apex. No visible pneumothorax. Mild cardiomegaly. Scarring at the left base. Right mid lung platelike atelectasis. No pleural effusion or consolidation. Coarse chronic interstitial opacity. Small amount of right lower chest wall emphysema. IMPRESSION: 1. Insertion of right-sided chest tube with tip at the apex, no visible pneumothorax 2. Mild cardiomegaly. Minimal platelike atelectasis in the right mid lung. Scarring at the left base Electronically Signed   By: Donavan Foil M.D.   On: 02/09/2019 16:15       Phillips Climes M.D on 03/05/2019 at 10:41 AM  Between 7am to 7pm - Pager - [phone]  After 7pm go to [email or web address] - password  [facility] -  Office  [phone]

## 2019-03-06 LAB — GLUCOSE, CAPILLARY
Glucose-Capillary: 245 mg/dL — ABNORMAL HIGH (ref 70–99)
Glucose-Capillary: 160 mg/dL — ABNORMAL HIGH (ref 70–99)
Glucose-Capillary: 228 mg/dL — ABNORMAL HIGH (ref 70–99)
Glucose-Capillary: 338 mg/dL — ABNORMAL HIGH (ref 70–99)
Glucose-Capillary: 263 mg/dL — ABNORMAL HIGH (ref 70–99)

## 2019-03-06 LAB — COMPREHENSIVE METABOLIC PANEL
ALT: 46 U/L — ABNORMAL HIGH (ref 0–44)
AST: 22 U/L (ref 15–41)
Albumin: 3.4 g/dL — ABNORMAL LOW (ref 3.5–5.0)
Alkaline Phosphatase: 102 U/L (ref 38–126)
Anion gap: 12 (ref 5–15)
BUN: 77 mg/dL — ABNORMAL HIGH (ref 8–23)
CO2: 28 mmol/L (ref 22–32)
Calcium: 8.7 mg/dL — ABNORMAL LOW (ref 8.9–10.3)
Chloride: 102 mmol/L (ref 98–111)
Creatinine, Ser: 1.63 mg/dL — ABNORMAL HIGH (ref 0.61–1.24)
GFR calc Af Amer: 47 mL/min — ABNORMAL LOW (ref >60)
GFR calc non Af Amer: 40 mL/min — ABNORMAL LOW (ref >60)
Glucose, Bld: 146 mg/dL — ABNORMAL HIGH (ref 70–99)
Potassium: 3.5 mmol/L (ref 3.5–5.1)
Sodium: 142 mmol/L (ref 135–145)
Total Bilirubin: 1.1 mg/dL (ref 0.3–1.2)
Total Protein: 6.3 g/dL — ABNORMAL LOW (ref 6.5–8.1)

## 2019-03-06 LAB — CBC WITH DIFFERENTIAL/PLATELET
Abs Immature Granulocytes: 0.09 10*3/uL — ABNORMAL HIGH (ref 0.00–0.07)
Basophils Absolute: 0 10*3/uL (ref 0.0–0.1)
Basophils Relative: 0 %
Eosinophils Absolute: 0.6 10*3/uL — ABNORMAL HIGH (ref 0.0–0.5)
Eosinophils Relative: 5 %
HCT: 49.8 % (ref 39.0–52.0)
Hemoglobin: 15.9 g/dL (ref 13.0–17.0)
Immature Granulocytes: 1 %
Lymphocytes Relative: 6 %
Lymphs Abs: 0.7 10*3/uL (ref 0.7–4.0)
MCH: 30.2 pg (ref 26.0–34.0)
MCHC: 31.9 g/dL (ref 30.0–36.0)
MCV: 94.5 fL (ref 80.0–100.0)
Monocytes Absolute: 0.4 10*3/uL (ref 0.1–1.0)
Monocytes Relative: 4 %
Neutro Abs: 10 10*3/uL — ABNORMAL HIGH (ref 1.7–7.7)
Neutrophils Relative %: 84 %
Platelets: 321 10*3/uL (ref 150–400)
RBC: 5.27 MIL/uL (ref 4.22–5.81)
RDW: 13.4 % (ref 11.5–15.5)
WBC: 11.7 10*3/uL — ABNORMAL HIGH (ref 4.0–10.5)
nRBC: 0 % (ref 0.0–0.2)

## 2019-03-06 LAB — C-REACTIVE PROTEIN: CRP: 0.7 mg/dL (ref <1.0)

## 2019-03-06 LAB — PHOSPHORUS: Phosphorus: 6 mg/dL — ABNORMAL HIGH (ref 2.5–4.6)

## 2019-03-06 LAB — MAGNESIUM: Magnesium: 2.9 mg/dL — ABNORMAL HIGH (ref 1.7–2.4)

## 2019-03-06 MED ORDER — ENOXAPARIN SODIUM 60 MG/0.6ML ~~LOC~~ SOLN
60.0000 mg | Freq: Every day | SUBCUTANEOUS | Status: DC
  Administered 2019-03-07: 10:00:00 60 mg via SUBCUTANEOUS
  Filled 2019-03-06: qty 0.6

## 2019-03-06 MED ORDER — POLYETHYLENE GLYCOL 3350 17 G PO PACK
17.0000 g | PACK | Freq: Once | ORAL | Status: AC
  Administered 2019-03-06: 15:00:00 17 g via ORAL
  Filled 2019-03-06: qty 1

## 2019-03-06 MED ORDER — BISACODYL 5 MG PO TBEC
10.0000 mg | DELAYED_RELEASE_TABLET | Freq: Once | ORAL | Status: AC
  Administered 2019-03-06: 10 mg via ORAL
  Filled 2019-03-06: qty 2

## 2019-03-06 NOTE — Progress Notes (Signed)
[facility] PCCM  Case discussed with [facility] attending Remains very hypoxemic Renal function worse No change in management recommended  Roselie Awkward, MD [facility] Pager: [phone] Cell: [phone] If no response, call [phone]

## 2019-03-06 NOTE — Plan of Care (Signed)
  Problem: Education: Goal: Knowledge of risk factors and measures for prevention of condition will improve Outcome: Progressing   Problem: Coping: Goal: Psychosocial and spiritual needs will be supported Outcome: Progressing   Problem: Respiratory: Goal: Will maintain a patent airway Outcome: Progressing Goal: Complications related to the disease process, condition or treatment will be avoided or minimized Outcome: no change this shift.   Pt continues on heated HFNC @ 30L 100%. Pt sat in chair 2x today, tolerated well. Pt had several calls and video chats with family and friends today, pt ended conversations stating "my voice gives out". Pt utilizes NRB mask when laying in bed. Pt had large BM today on BSC.

## 2019-03-06 NOTE — Progress Notes (Signed)
Occupational Therapy Treatment Patient Details Name: Justin Atkinson. MRN: [id number] DOB: 1940-11-03 Today's Date: 03/06/2019    History of present illness 78 y.o. male with medical history significant of COPD/pulmonary fibrosis was on home O2 however subsequently discontinued over the past few weeks as hypoxia had improved, recent bronchoscopy, right thoracoscopy, wedge resection of right lung February 09 2019, cervical degenerative disc disease, hypertension, diabetes, hyperlipidemia who presents to the ED with a 10-day history of increasing productive cough, shortness of breath, chills. Tested COVID +.    OT comments  Pt making gradual progress towards OT goals, presents seated in recliner pleasant and willing to participate in therapies. Pt tolerating functional transfers to/from [facility] this session with +2 minA. Pt on NRB and heated HFNC (30L, 100% FiO2) with lowest SpO2 noted 86% post transfer; BP 106/53 (77) and HR, RR stable throughout. Pt continues to have limitations due to decreased activity tolerance and respiratory status, though remains motivated to work with therapies and overall tolerating session well today. Have updated d/c recommendations to reflect pt's current progress. Will continue to follow while he remains in acute setting.   Follow Up Recommendations  CIR;Supervision/Assistance - 24 hour    Equipment Recommendations  3 in 1 bedside commode    Recommendations for Other Services      Precautions / Restrictions Precautions Precautions: Fall;Other (comment) Precaution Comments: on lots of O2 desats very fast Restrictions Weight Bearing Restrictions: No       Mobility Bed Mobility               General bed mobility comments: pt OOB in recliner upon arrival  Transfers Overall transfer level: Needs assistance Equipment used: None Transfers: Sit to/from Stand;Stand Pivot Transfers Sit to Stand: Min assist;+2 safety/equipment Stand pivot transfers: Min  assist;+2 safety/equipment       General transfer comment: two person assist for stand pivot to/from [facility]; min cues for hand placement/transitions, pt attempted to remove NRB prior to transfer cued to leave it on to ensure adequate oxygenation    Balance                                           ADL either performed or assessed with clinical judgement   ADL Overall ADL's : Needs assistance/impaired                         Toilet Transfer: Minimal assistance;+2 for physical assistance;+2 for safety/equipment;Stand-pivot;BSC   Toileting- Clothing Manipulation and Hygiene: Maximal assistance;Sit to/from stand;+2 for physical assistance;+2 for safety/equipment Toileting - Clothing Manipulation Details (indicate cue type and reason): assist for pericare in standing; pt attempted to have BM but unsuccessful     Functional mobility during ADLs: Minimal assistance;+2 for physical assistance;+2 for safety/equipment General ADL Comments: pt with imrovements noted in tolerance to activity today compared to previous sessions, still heavily reliant on increased amount of O2     Vision       Perception     Praxis      Cognition Arousal/Alertness: Awake/alert Behavior During Therapy: WFL for tasks assessed/performed Overall Cognitive Status: Within Functional Limits for tasks assessed                                 General Comments: appears to be accurate historian  today, requires min cues for problem solving transfers (which direction to turn to avoid tangling lines)        Exercises Exercises: General Lower Extremity General Exercises - Lower Extremity Long Arc Quad: AROM;Both;10 reps;Seated   Shoulder Instructions       General Comments      Pertinent Vitals/ Pain       Pain Assessment: No/denies pain  Home Living                                          Prior Functioning/Environment               Frequency  Min 2X/week        Progress Toward Goals  OT Goals(current goals can now be found in the care plan section)  Progress towards OT goals: Progressing toward goals  Acute Rehab OT Goals Patient Stated Goal: "Go home to my wife" OT Goal Formulation: With patient Time For Goal Achievement: 03/20/19 Potential to Achieve Goals: Good  Plan Discharge plan needs to be updated    Co-evaluation                 AM-PAC OT "6 Clicks" Daily Activity     Outcome Measure   Help from another person eating meals?: A Little Help from another person taking care of personal grooming?: A Little Help from another person toileting, which includes using toliet, bedpan, or urinal?: A Lot Help from another person bathing (including washing, rinsing, drying)?: A Lot Help from another person to put on and taking off regular upper body clothing?: A Lot Help from another person to put on and taking off regular lower body clothing?: A Lot 6 Click Score: 14    End of Session Equipment Utilized During Treatment: Oxygen  OT Visit Diagnosis: Unsteadiness on feet (R26.81);Other abnormalities of gait and mobility (R26.89);Muscle weakness (generalized) (M62.81);Other symptoms and signs involving cognitive function   Activity Tolerance Patient tolerated treatment well   Patient Left in chair;with call bell/phone within reach;with nursing/sitter in room   Nurse Communication Mobility status        Time: [id number] OT Time Calculation (min): 29 min  Charges: OT General Charges $OT Visit: 1 Visit OT Treatments $Self Care/Home Management : 23-37 mins  Lou Cal, OT [facility] Services Pager [phone] Office [place] 03/06/2019, 4:31 PM

## 2019-03-06 NOTE — Progress Notes (Signed)
PROGRESS NOTE                                                                                                                                                                                                             Patient Demographics:    Justin Atkinson, is a 78 y.o. male, DOB - 12/25/1940, BSJ:[id number]  Admit date - 02/22/2019   Admitting Physician Eugenie Filler, MD  Outpatient Primary MD for the patient is [name], MD  LOS - 7   No chief complaint on file.      Brief Narrative    78 year old  WM PMHx COPD/pulmonary fibrosis recently diagnosed hypersensitivity pneumonitis (had VATS wedge resection on 11/9,follows with Dr. Vaughan Browner) who was recently started on steroid on 11/18, HTN, diabetes type 2 uncontrolled with complication, HLD, and DJD . -Patient admitted to ICU given profound hypoxia, still requiring heated high flow oxygen together with nonrebreather as well, received Actemra, plasma, treated with remdesivir and steroids, patient remains in ICU, still with significant permanent, with no improvement.   Subjective:    Justin Atkinson with no significant events overnight, he remains on Precedex, patient with no bowel movement per nursing staff.   Assessment  & Plan :    Principal Problem:   Acute respiratory failure with hypoxia (HCC) Active Problems:   Hypertension   Hypercholesterolemia   Benign prostate hyperplasia   ILD (interstitial lung disease) (HCC)   Postinflammatory pulmonary fibrosis (HCC)   HCAP (healthcare-associated pneumonia)   Chronic obstructive pulmonary disease (HCC)   Pneumonia due to COVID-19 virus   Severe sepsis (HCC)   Essential hypertension   Diabetes mellitus type 2, controlled, with complications ([place])   Pulmonary fibrosis (HCC)   Hypersensitivity pneumonitis (HCC)   Acute hypoxic respiratory failure due to COVID-19 pneumonia -Patient remains with significant hypoxia, he remains on 15 L  heated high flow nasal cannula and NRB 15 L together . -Patient with underlying hypersensitivity pneumonitis, and history of ILD. -Remains in ICU. -Received convalescent plasma 11/30 -Received remdesivir x5 days -Received Actemra 11/29 -Patient encouraged to use incentive spirometry, flutter valve, and to do modified proning . -On IV diuresis, will hold currently given worsening renal function with elevation of creatinine at 1.6    COVID-19 Labs  Recent Labs    03/04/19 0520 03/05/19 0350 03/06/19 0303  DDIMER 1.99*  2.01*  --   FERRITIN 449* 467*  --   LDH 558*  --   --   CRP 1.8* 1.0* 0.7    Lab Results  Component Value Date   SARSCOV2NAA POSITIVE (A) 02/14/2019   SARSCOV2NAA NOT DETECTED 02/05/2019   [place] NOT DETECTED 01/09/2019   Hypersensivitivy pneumonitis/ Hx of ILD -Following with pulmonary Dr. Vaughan Browner as an outpatient -Diagnosed with recent biopsy - Prednisone 9m daily at discharge until seen in pulmonary clinic per PCCM.  He is on IV Solu-Medrol 30 mg IV daily.  AKI -Patient with known solitary kidney. -Continue went up to 1.6, will DC his Lasix, especially his -7.2 L since admission. -Antihypertensive medication has been stopped as well  Hypertension -Pressure on the lower side today, already stopped irbesartan, will DC hydralazine and metoprolol today .  Hyperlipidemia -Continue with statin  Acute metabolic encephalopathy/hospital delirium -Patient with significant agitation/confusion, where to be on Precedex drip, agitation is currently controlled, will try to wean off drip, is already on Seroquel nightly and as needed Haldol.  Diabetes mellitus controlled -11/28 hemoglobin A1c= 7.0 -Better controlled after increasing Levemir to 18 units nightly, continue with assistance sliding scale. -12/1 increase Levemir 18 units qhs -12/1 increase NovoLog 6 units qac  Code Status : DNR,   Family Communication  : Discussed with daughter and wife today.    Disposition Plan  : remains in ICU  Consults  :  PCCM  Procedures  : None  DVT Prophylaxis  :  [place] lovenox  Lab Results  Component Value Date   PLT 321 03/06/2019    Antibiotics  :    Anti-infectives (From admission, onward)   Start     Dose/Rate Route Frequency Ordered Stop   03/01/19 0800  remdesivir 100 mg in sodium chloride 0.9 % 250 mL IVPB     100 mg 500 mL/hr over 30 Minutes Intravenous Every 24 hours 02/28/19 0823 03/04/19 1216   02/28/19 1000  remdesivir 200 mg in sodium chloride 0.9 % 250 mL IVPB     200 mg 500 mL/hr over 30 Minutes Intravenous Once 02/28/19 0823 02/28/19 1006   02/28/19 0000  vancomycin (VANCOCIN) 1,250 mg in sodium chloride 0.9 % 250 mL IVPB  Status:  Discontinued     1,250 mg 166.7 mL/hr over 90 Minutes Intravenous Every 12 hours 02/22/2019 1639 03/02/2019 2037   02/12/2019 2200  ceFEPIme (MAXIPIME) 2 g in sodium chloride 0.9 % 100 mL IVPB  Status:  Discontinued     2 g 200 mL/hr over 30 Minutes Intravenous Every 8 hours 02/11/2019 1629 03/03/19 1242   02/20/2019 1315  ceFEPIme (MAXIPIME) 2 g in sodium chloride 0.9 % 100 mL IVPB     2 g 200 mL/hr over 30 Minutes Intravenous  Once 02/12/2019 1306 02/28/19 0324   02/19/2019 1315  vancomycin (VANCOCIN) 2,000 mg in sodium chloride 0.9 % 500 mL IVPB     2,000 mg 250 mL/hr over 120 Minutes Intravenous  Once 02/24/2019 1311 02/28/19 0324        Objective:   Vitals:   03/06/19 0733 03/06/19 0800 03/06/19 0900 03/06/19 1100  BP:  99/84 120/72 (!) 92/50  Pulse:  66 62 70  Resp: 15 (!) 22 (!) 25 (!) 29  Temp:  97.9 F (36.6 C)    TempSrc:  Axillary    SpO2:  95% (!) 86% 91%  Weight:      Height:        Wt Readings from Last 3  Encounters:  02/28/19 130.7 kg  02/20/19 (!) 137 kg  02/18/19 (!) 137 kg     Intake/Output Summary (Last 24 hours) at 03/06/2019 1214 Last data filed at 03/06/2019 0800 Gross per 24 hour  Intake 480 ml  Output 845 ml  Net -365 ml     Physical Exam  Awake Alert, more  appropriate and communicative today, but easily distracted, frail Symmetrical Chest wall movement, managed air entry at the bases, but no crackles today RRR,No Gallops,Rubs or new Murmurs, No Parasternal Heave +ve B.Sounds, Abd Soft, No tenderness, No rebound - guarding or rigidity. No Cyanosis, Clubbing or edema, No new Rash or bruise     Data Review:    CBC Recent Labs  Lab 03/02/19 0455 03/02/19 0649 03/03/19 0450 03/04/19 0520 03/05/19 0350 03/06/19 0303  WBC 10.6*  --  12.1* 11.3* 11.4* 11.7*  HGB 14.0 13.9 14.5 15.6 15.8 15.9  HCT 43.6 41.0 44.9 48.4 48.7 49.8  PLT 363  --  361 356 323 321  MCV 94.6  --  94.7 94.2 93.8 94.5  MCH 30.4  --  30.6 30.4 30.4 30.2  MCHC 32.1  --  32.3 32.2 32.4 31.9  RDW 13.4  --  13.3 13.3 13.4 13.4  LYMPHSABS 0.4*  --  0.5* 0.5* 0.8 0.7  MONOABS 0.5  --  0.3 0.5 0.4 0.4  EOSABS 0.0  --  0.0 0.0 0.3 0.6*  BASOSABS 0.0  --  0.0 0.0 0.0 0.0    Chemistries  Recent Labs  Lab 03/02/19 0455 03/02/19 0649 03/03/19 0450 03/04/19 0520 03/05/19 0350 03/06/19 0303  NA 142 141 142 144 143 142  K 4.8 4.4 4.7 4.5 3.9 3.5  CL 106  --  106 106 103 102  CO2 25  --  '24 25 26 28  ' GLUCOSE 281*  --  213* 184* 178* 146*  BUN 37*  --  35* 42* 55* 77*  CREATININE 0.92  --  0.84 0.89 1.12 1.63*  CALCIUM 8.9  --  9.1 9.4 9.0 8.7*  MG 2.6*  --  2.4 2.6* 2.6* 2.9*  AST 30  --  34 '25 28 22  ' ALT 51*  --  55* 53* 50* 46*  ALKPHOS 93  --  105 115 104 102  BILITOT 1.0  --  1.0 0.9 1.0 1.1   ------------------------------------------------------------------------------------------------------------------ No results for input(s): CHOL, HDL, LDLCALC, TRIG, CHOLHDL, LDLDIRECT in the last 72 hours.  Lab Results  Component Value Date   HGBA1C 7.0 (H) 02/28/2019   ------------------------------------------------------------------------------------------------------------------ No results for input(s): TSH, T4TOTAL, T3FREE, THYROIDAB in the last 72  hours.  Invalid input(s): FREET3 ------------------------------------------------------------------------------------------------------------------ Recent Labs    03/04/19 0520 03/05/19 0350  FERRITIN 449* 467*    Coagulation profile No results for input(s): INR, PROTIME in the last 168 hours.  Recent Labs    03/04/19 0520 03/05/19 0350  DDIMER 1.99* 2.01*    Cardiac Enzymes No results for input(s): CKMB, TROPONINI, MYOGLOBIN in the last 168 hours.  Invalid input(s): CK ------------------------------------------------------------------------------------------------------------------    Component Value Date/Time   BNP 116.4 (H) 03/05/2019 0350    Inpatient Medications  Scheduled Meds:  aspirin EC  81 mg Oral Daily   Chlorhexidine Gluconate Cloth  6 each Topical Daily   enoxaparin (LOVENOX) injection  60 mg Subcutaneous BID   fluticasone  2 spray Each Nare Daily   guaiFENesin  1,200 mg Oral BID   hydrALAZINE  25 mg Oral Q8H   insulin aspart  0-20 Units  Subcutaneous TID WC   insulin aspart  6 Units Subcutaneous TID WC   insulin detemir  18 Units Subcutaneous QHS   latanoprost  1 drop Both Eyes QHS   loratadine  10 mg Oral Daily   mouth rinse  15 mL Mouth Rinse BID   methylPREDNISolone (SOLU-MEDROL) injection  30 mg Intravenous Daily   metoprolol tartrate  2.5 mg Intravenous Q6H   omega-3 acid ethyl esters  1 g Oral Daily   pantoprazole  40 mg Oral Daily   QUEtiapine  50 mg Oral QHS   rosuvastatin  10 mg Oral Daily   timolol  1 drop Left Eye BID   traZODone  25 mg Oral QHS   umeclidinium-vilanterol  1 puff Inhalation Daily   Continuous Infusions:  PRN Meds:.acetaminophen, albuterol, ALPRAZolam, haloperidol lactate, morphine injection, ondansetron (ZOFRAN) IV, polyethylene glycol, sodium chloride  Micro Results Recent Results (from the past 240 hour(s))  Blood culture (routine x 2)     Status: None   Collection Time: 02/11/2019 11:48 AM    Specimen: BLOOD  Result Value Ref Range Status   Specimen Description   Final    BLOOD RIGHT ANTECUBITAL Performed at [facility], [place] [address], [place], [place] [address]    Special Requests   Final    BOTTLES DRAWN AEROBIC AND ANAEROBIC Blood Culture results may not be optimal due to an excessive volume of blood received in culture bottles Performed at [place] [address], [place], [place] [address]    Culture   Final    NO GROWTH 5 DAYS Performed at [facility], [place] [address] [place], [place] [address]    Report Status 03/04/2019 FINAL  Final  Blood culture (routine x 2)     Status: None   Collection Time: 02/01/2019 11:50 AM   Specimen: BLOOD RIGHT HAND  Result Value Ref Range Status   Specimen Description   Final    BLOOD RIGHT HAND Performed at [place] [address] [place], [place] [address]    Special Requests   Final    BOTTLES DRAWN AEROBIC AND ANAEROBIC Blood Culture results may not be optimal due to an excessive volume of blood received in culture bottles Performed at [facility] [address], [place], [place] [address]    Culture   Final    NO GROWTH 5 DAYS Performed at [facility], [place] [address], [place], [place] [address]    Report Status 03/04/2019 FINAL  Final  SARS CORONAVIRUS 2 (TAT 6-24 HRS) Nasopharyngeal Nasopharyngeal Swab     Status: Abnormal   Collection Time: 02/23/2019  2:00 PM   Specimen: Nasopharyngeal Swab  Result Value Ref Range Status   SARS Coronavirus 2 POSITIVE (A) NEGATIVE Final    Comment: RESULT CALLED TO, READ BACK BY AND VERIFIED WITH: K.GARCIA RN 1950 02/13/2019 MCCORMICK K (NOTE) SARS-CoV-2 target nucleic acids are DETECTED. The SARS-CoV-2 RNA is generally detectable in upper and lower respiratory specimens during the acute phase of infection. Positive results are indicative of the presence of SARS-CoV-2 RNA.  Clinical correlation with patient history and other diagnostic information is  necessary to determine patient infection status. Positive results do not rule out bacterial infection or co-infection with other viruses.  The expected result is Negative. Fact Sheet for Patients: SugarRoll.be Fact Sheet for Healthcare Providers: https://www.woods-mathews.com/ This test is not yet approved or cleared by the [place] FDA and  has been  authorized for detection and/or diagnosis of SARS-CoV-2 by FDA under an Emergency Use Authorization (EUA). This EUA will remain  in effect (meaning this test can be used) for  the duration of the COVID-19 declaration under Section 564(b)(1) of the Act, 21 U.S.C. section 360bbb-3(b)(1), unless the authorization is terminated or revoked sooner. Performed at [facility], [place] [address], [place], [place] [address]   Culture, sputum-assessment     Status: None   Collection Time: 02/26/2019  4:04 PM   Specimen: Sputum  Result Value Ref Range Status   Specimen Description SPUTUM  Final   Special Requests NONE  Final   Sputum evaluation   Final    Sputum specimen not acceptable for testing.  Please recollect.   RESULT CALLED TO, READ BACK BY AND VERIFIED WITH: I [facility] 02/28/19 2116 RHOLMES Performed at [facility], [place] [address] [place], [place] [address]    Report Status 02/28/2019 FINAL  Final  Respiratory Panel by PCR     Status: None   Collection Time: 02/16/2019  6:45 PM   Specimen: Flu Kit Nasopharyngeal Swab; Respiratory  Result Value Ref Range Status   Adenovirus NOT DETECTED NOT DETECTED Final   Coronavirus 229E NOT DETECTED NOT DETECTED Final    Comment: (NOTE) The Coronavirus on the Respiratory Panel, DOES NOT test for the novel  Coronavirus (2019 nCoV)    Coronavirus HKU1 NOT DETECTED NOT DETECTED Final   Coronavirus NL63 NOT DETECTED NOT DETECTED Final   Coronavirus OC43  NOT DETECTED NOT DETECTED Final   Metapneumovirus NOT DETECTED NOT DETECTED Final   Rhinovirus / Enterovirus NOT DETECTED NOT DETECTED Final   Influenza A NOT DETECTED NOT DETECTED Final   Influenza B NOT DETECTED NOT DETECTED Final   Parainfluenza Virus 1 NOT DETECTED NOT DETECTED Final   Parainfluenza Virus 2 NOT DETECTED NOT DETECTED Final   Parainfluenza Virus 3 NOT DETECTED NOT DETECTED Final   Parainfluenza Virus 4 NOT DETECTED NOT DETECTED Final   Respiratory Syncytial Virus NOT DETECTED NOT DETECTED Final   Bordetella pertussis NOT DETECTED NOT DETECTED Final   Chlamydophila pneumoniae NOT DETECTED NOT DETECTED Final   Mycoplasma pneumoniae NOT DETECTED NOT DETECTED Final    Comment: Performed at [facility], [place]. [address], [place], [place] [address]  MRSA PCR Screening     Status: None   Collection Time: 02/22/2019  6:45 PM   Specimen: Nasal Mucosa; Nasopharyngeal  Result Value Ref Range Status   MRSA by PCR NEGATIVE NEGATIVE Final    Comment:        The GeneXpert MRSA Assay (FDA approved for NASAL specimens only), is one component of a comprehensive MRSA colonization surveillance program. It is not intended to diagnose MRSA infection nor to guide or monitor treatment for MRSA infections. Performed at [facility], [place] [address] [place], [place] [address]   Urine culture     Status: None   Collection Time: 02/14/2019  7:15 PM   Specimen: Urine, Random  Result Value Ref Range Status   Specimen Description   Final    URINE, RANDOM Performed at [place] [address] [place], [place] [address]    Special Requests   Final    NONE Performed at [facility], [place] [address], [place], [place] [address]    Culture   Final    NO GROWTH Performed at [facility], [name] [address] [place], [place] [address]  Report Status 03/01/2019 FINAL  Final    Radiology Reports Dg Chest 2 View  Result  Date: 02/20/2019 CLINICAL DATA:  Interstitial lung disease, COPD, diabetes mellitus, hypertension EXAM: CHEST - 2 VIEW COMPARISON:  02/11/2019, 02/09/2019 FINDINGS: Enlargement of cardiac silhouette. Mediastinal contours and pulmonary vascularity normal. Mild accentuation of interstitial markings in the periphery of both lungs. No acute infiltrate, pleural effusion or pneumothorax. Osseous structures unremarkable. IMPRESSION: Peripheral interstitial prominence in the lungs bilaterally, slightly greater at bases. No definite acute infiltrate. Mild enlargement of cardiac silhouette. Electronically Signed   By: Lavonia Dana M.D.   On: 02/20/2019 13:08   Dg Chest 2 View  Result Date: 02/09/2019 CLINICAL DATA:  Pre right lung biopsy evaluation. Pulmonary fibrosis. Ex-smoker. EXAM: CHEST - 2 VIEW COMPARISON:  Chest CT dated 09/22/2018. Chest radiographs dated 07/25/2012 FINDINGS: The cardiac silhouette is borderline enlarged. The aorta is tortuous. Stable minimal linear scarring at the left lung base with interval minimal linear scarring in the left mid lung zone. Mild diffuse peribronchial thickening with improvement. Thoracic and upper lumbar spine degenerative changes. IMPRESSION: 1. No acute abnormality. 2. Mild chronic bronchitic changes. Electronically Signed   By: Claudie Revering M.D.   On: 02/09/2019 09:32   Ct Angio Chest Pe W And/or Wo Contrast  Result Date: 02/11/2019 CLINICAL DATA:  PE suspected, intermediate prob, neg D-dimer. Cough and shortness of breath. Recent VATS lung biopsy 02/09/2019. EXAM: CT ANGIOGRAPHY CHEST WITH CONTRAST TECHNIQUE: Multidetector CT imaging of the chest was performed using the standard protocol during bolus administration of intravenous contrast. Multiplanar CT image reconstructions and MIPs were obtained to evaluate the vascular anatomy. CONTRAST:  15m OMNIPAQUE IOHEXOL 350 MG/ML SOLN COMPARISON:  Radiographs earlier this day. Chest CT 09/22/2018 FINDINGS: Cardiovascular:  Contrast bolus timing and breathing motion artifact limits detailed assessment. No central filling defects in the pulmonary arteries, evaluation is diagnostic to the lobar level. There is opacification of multiple segmental and subsegmental branches, however more detailed assessment is limited. Multi chamber cardiomegaly. Minor aortic atherosclerosis without aneurysm. No aortic dissection. No pericardial effusion. There is contrast refluxing into the hepatic veins and IVC. Mediastinum/Nodes: No enlarged mediastinal, hilar, or axillary lymph nodes. Patulous esophagus without wall thickening. No visualized thyroid nodule. Lungs/Pleura: Ground-glass opacities, fairly diffuse throughout the left lung and involving the right lower lobe. Patchy involvement of the right upper and right middle lobes. Findings are new from prior exam. There is no definite septal thickening. No pneumothorax or confluent airspace disease. No significant pleural fluid. Trachea and mainstem bronchi are patent. Pulmonary nodules on prior exam are obscured by motion. Emphysema. Upper Abdomen: Contrast refluxes into the hepatic veins and IVC. Right renal cyst partially included. Suspected hepatic steatosis. Musculoskeletal: Subcutaneous emphysema throughout the right chest wall which tracks posteriorly and anteriorly chest to the left of midline. This is likely related to recent VATS. Unchanged rounded sclerotic density within T10. degenerative change in the spine. Review of the MIP images confirms the above findings. IMPRESSION: 1. Ground-glass opacities throughout both lungs, diffuse on the left and more patchy in geographic on the right. Findings may represent asymmetric pulmonary edema or infection, including atypical viral organisms. 2. Mild cardiomegaly. Reflux of contrast into the hepatic veins and IVC consistent with elevated right heart pressures. 3. Limited assessment for pulmonary embolus given contrast bolus timing and patient motion,  no large central pulmonary embolism. 4. Subcutaneous emphysema in the right chest wall likely related to recent VATS. No pneumothorax. Aortic Atherosclerosis (ICD10-I70.0). Emphysema (ICD10-J43.9). Electronically Signed  By: Keith Rake M.D.   On: 02/10/2019 17:08   Dg Chest Port 1 View  Result Date: 02/16/2019 CLINICAL DATA:  Cough dyspnea. EXAM: PORTABLE CHEST 1 VIEW COMPARISON:  CT chest 09/22/2018, chest x-ray of February 20, 2019 FINDINGS: Heart size is enlarged as before. Bibasilar opacities have developed since the 02/20/2019 with dense retrocardiac opacification and with more interstitial and subtle alveolar opacities in the right chest. Patient reportedly had a lung biopsy. There is no visible pneumothorax on this semi erect view. Subcutaneous emphysema is noted along the right lateral chest. No acute bone finding. IMPRESSION: 1. Findings that may represent multifocal pneumonia or asymmetric edema. 2. Consolidation or volume loss at the left lung base greater than right. 3. Pulmonary hemorrhage considered on the right given the patient's history of recent right-sided biopsy. 4. No signs of pneumothorax on semi erect view though there is subcutaneous emphysema along the right chest wall. Occult pneumothorax is not excluded. Electronically Signed   By: Zetta Bills M.D.   On: 02/26/2019 11:35   Dg Chest Port 1 View  Result Date: 02/11/2019 CLINICAL DATA:  Chest tube removal EXAM: PORTABLE CHEST 1 VIEW COMPARISON:  February 10, 2019 FINDINGS: Interval removal of right chest tube with new right chest wall subcutaneous emphysema. No definite pneumothorax. Low lung volumes with bibasilar atelectasis. Stable cardiomediastinal contours. IMPRESSION: Post right chest tube removal with new subcutaneous emphysema. No definite pneumothorax. Electronically Signed   By: Macy Mis M.D.   On: 02/11/2019 08:14   Dg Chest Port 1 View  Result Date: 02/10/2019 CLINICAL DATA:  Patient status post  right upper, middle and lower lobe wedge resection 02/09/2019. Chest tube in place. EXAM: PORTABLE CHEST 1 VIEW COMPARISON:  Single-view of the chest 02/09/2019. FINDINGS: Right chest tube remains in place. No pneumothorax. Subsegmental atelectasis in the right lung base has increased. The left lung is clear. Heart size is normal. IMPRESSION: Negative for pneumothorax with a right chest tube in place. Increased subsegmental atelectasis right lung base. Electronically Signed   By: Inge Rise M.D.   On: 02/10/2019 06:43   Dg Chest Port 1 View  Result Date: 02/09/2019 CLINICAL DATA:  Follow-up lung surgery EXAM: PORTABLE CHEST 1 VIEW COMPARISON:  02/09/2019 FINDINGS: Insertion of right-sided chest tube with tip at the apex. No visible pneumothorax. Mild cardiomegaly. Scarring at the left base. Right mid lung platelike atelectasis. No pleural effusion or consolidation. Coarse chronic interstitial opacity. Small amount of right lower chest wall emphysema. IMPRESSION: 1. Insertion of right-sided chest tube with tip at the apex, no visible pneumothorax 2. Mild cardiomegaly. Minimal platelike atelectasis in the right mid lung. Scarring at the left base Electronically Signed   By: Donavan Foil M.D.   On: 02/09/2019 16:15       Phillips Climes M.D on 03/06/2019 at 12:14 PM  Between 7am to 7pm - Pager - [phone]  After 7pm go to [email or web address] - password [facility]  [facility] -  Office  [phone]

## 2019-03-07 LAB — GLUCOSE, CAPILLARY
Glucose-Capillary: 149 mg/dL — ABNORMAL HIGH (ref 70–99)
Glucose-Capillary: 236 mg/dL — ABNORMAL HIGH (ref 70–99)
Glucose-Capillary: 217 mg/dL — ABNORMAL HIGH (ref 70–99)

## 2019-03-07 LAB — COMPREHENSIVE METABOLIC PANEL
ALT: 46 U/L — ABNORMAL HIGH (ref 0–44)
AST: 23 U/L (ref 15–41)
Albumin: 3.1 g/dL — ABNORMAL LOW (ref 3.5–5.0)
Alkaline Phosphatase: 96 U/L (ref 38–126)
Anion gap: 13 (ref 5–15)
BUN: 80 mg/dL — ABNORMAL HIGH (ref 8–23)
CO2: 29 mmol/L (ref 22–32)
Calcium: 9.1 mg/dL (ref 8.9–10.3)
Chloride: 101 mmol/L (ref 98–111)
Creatinine, Ser: 1.58 mg/dL — ABNORMAL HIGH (ref 0.61–1.24)
GFR calc Af Amer: 49 mL/min — ABNORMAL LOW (ref >60)
GFR calc non Af Amer: 42 mL/min — ABNORMAL LOW (ref >60)
Glucose, Bld: 139 mg/dL — ABNORMAL HIGH (ref 70–99)
Potassium: 4 mmol/L (ref 3.5–5.1)
Sodium: 143 mmol/L (ref 135–145)
Total Bilirubin: 1 mg/dL (ref 0.3–1.2)
Total Protein: 6.2 g/dL — ABNORMAL LOW (ref 6.5–8.1)

## 2019-03-07 LAB — C-REACTIVE PROTEIN: CRP: 0.6 mg/dL (ref <1.0)

## 2019-03-07 LAB — MAGNESIUM: Magnesium: 2.9 mg/dL — ABNORMAL HIGH (ref 1.7–2.4)

## 2019-03-07 MED ORDER — ENOXAPARIN SODIUM 60 MG/0.6ML ~~LOC~~ SOLN
60.0000 mg | Freq: Two times a day (BID) | SUBCUTANEOUS | Status: DC
  Administered 2019-03-07 – 2019-03-11 (×8): 60 mg via SUBCUTANEOUS
  Filled 2019-03-07 (×9): qty 0.6

## 2019-03-07 MED ORDER — INSULIN DETEMIR 100 UNIT/ML ~~LOC~~ SOLN
6.0000 [IU] | Freq: Every day | SUBCUTANEOUS | Status: DC
  Administered 2019-03-07 – 2019-03-08 (×2): 6 [IU] via SUBCUTANEOUS
  Filled 2019-03-07 (×3): qty 0.06

## 2019-03-07 NOTE — Progress Notes (Signed)
PROGRESS NOTE                                                                                                                                                                                                             Patient Demographics:    Justin Atkinson, is a 78 y.o. male, DOB - 1940-05-15, ANV:[id number]  Admit date - 02/14/2019   Admitting Physician Eugenie Filler, MD  Outpatient Primary MD for the patient is [name], MD  LOS - 8   No chief complaint on file.      Brief Narrative    78 year old  WM PMHx COPD/pulmonary fibrosis recently diagnosed hypersensitivity pneumonitis (had VATS wedge resection on 11/9,follows with Dr. Vaughan Browner) who was recently started on steroid on 11/18, HTN, diabetes type 2 uncontrolled with complication, HLD, and DJD . -Patient admitted to ICU given profound hypoxia, still requiring heated high flow oxygen together with nonrebreather as well, received Actemra, plasma, treated with remdesivir and steroids, patient remains in ICU, still with significant oxygen requirement, with no improvement.   Subjective:    Justin Atkinson with no significant events overnight, currently off Precedex, he had a bowel movement yesterday, able to sit on the chair for couple hours yesterday .   Assessment  & Plan :    Principal Problem:   Acute respiratory failure with hypoxia (HCC) Active Problems:   Hypertension   Hypercholesterolemia   Benign prostate hyperplasia   ILD (interstitial lung disease) (HCC)   Postinflammatory pulmonary fibrosis (HCC)   HCAP (healthcare-associated pneumonia)   Chronic obstructive pulmonary disease (HCC)   Pneumonia due to COVID-19 virus   Severe sepsis (HCC)   Essential hypertension   Diabetes mellitus type 2, controlled, with complications ([place])   Pulmonary fibrosis (HCC)   Hypersensitivity pneumonitis (HCC)   Acute hypoxic respiratory failure due to COVID-19 pneumonia -Patient remains  with significant hypoxia, he remains on 35 L heated high flow nasal cannula, and 15 L NRB together . -Patient with underlying hypersensitivity pneumonitis, and history of ILD. -Remains in ICU given high oxygen requirement. -Received convalescent plasma 11/30 -Received remdesivir x5 days -Received Actemra 11/29 -Patient encouraged to use incentive spirometry, flutter valve, and to do modified proning . -IV diuresis currently on hold given worsening renal function.   [place]    03/05/19 0350 03/06/19 0303  03/07/19 0250  DDIMER 2.01*  --   --   FERRITIN 467*  --   --   CRP 1.0* 0.7 0.6    Lab Results  Component Value Date   SARSCOV2NAA POSITIVE (A) 02/09/2019   SARSCOV2NAA NOT DETECTED 02/05/2019   [place] NOT DETECTED 01/09/2019   Hypersensivitivy pneumonitis/ Hx of ILD -Following with pulmonary Dr. Vaughan Browner as an outpatient -Diagnosed with recent biopsy - Prednisone 52m daily at discharge until seen in pulmonary clinic per PCCM.  He is on IV Solu-Medrol 30 mg IV daily.  AKI -Patient with known solitary kidney. -Diuresis on hold given creatinine trending up . -Antihypertensive medication has been stopped as well  Hypertension -Given blood pressure on the lower side, medication has been on hold given irbesartan, hydrochlorothiazide and metoprolol .  Hyperlipidemia -Continue with statin  Acute metabolic encephalopathy/hospital delirium -Patient with significant agitation/confusion, where he was started on Precedex drip, is currently improved, off Precedex drip, on Seroquel nightly and as needed Haldol.  Diabetes mellitus controlled -11/28 hemoglobin A1c= 7.0 -ABGs remains elevated, will add Levemir 6 units daily, continue with Levemir 18 units nightly, continue with sliding scale and 6 units NovoLog before meals.  Code Status : DNR,   Family Communication  : Daughter updated daily  Disposition Plan  : remains in ICU  Consults  :   PCCM  Procedures  : None  DVT Prophylaxis  :  Roxana lovenox  Lab Results  Component Value Date   PLT 321 03/06/2019    Antibiotics  :    Anti-infectives (From admission, onward)   Start     Dose/Rate Route Frequency Ordered Stop   03/01/19 0800  remdesivir 100 mg in sodium chloride 0.9 % 250 mL IVPB     100 mg 500 mL/hr over 30 Minutes Intravenous Every 24 hours 02/28/19 0823 03/04/19 1216   02/28/19 1000  remdesivir 200 mg in sodium chloride 0.9 % 250 mL IVPB     200 mg 500 mL/hr over 30 Minutes Intravenous Once 02/28/19 0823 02/28/19 1006   02/28/19 0000  vancomycin (VANCOCIN) 1,250 mg in sodium chloride 0.9 % 250 mL IVPB  Status:  Discontinued     1,250 mg 166.7 mL/hr over 90 Minutes Intravenous Every 12 hours 02/21/2019 1639 02/12/2019 2037   02/26/2019 2200  ceFEPIme (MAXIPIME) 2 g in sodium chloride 0.9 % 100 mL IVPB  Status:  Discontinued     2 g 200 mL/hr over 30 Minutes Intravenous Every 8 hours 02/04/2019 1629 03/03/19 1242   03/02/2019 1315  ceFEPIme (MAXIPIME) 2 g in sodium chloride 0.9 % 100 mL IVPB     2 g 200 mL/hr over 30 Minutes Intravenous  Once 02/02/2019 1306 02/28/19 0324   02/18/2019 1315  vancomycin (VANCOCIN) 2,000 mg in sodium chloride 0.9 % 500 mL IVPB     2,000 mg 250 mL/hr over 120 Minutes Intravenous  Once 02/03/2019 1311 02/28/19 0324        Objective:   Vitals:   03/07/19 1300 03/07/19 1400 03/07/19 1500 03/07/19 1520  BP: 117/62 (!) 116/58    Pulse: 65 65 67 90  Resp: 19 (!) 22 20 (!) 22  Temp:      TempSrc:      SpO2: 92% 99% 95%   Weight:      Height:        Wt Readings from Last 3 Encounters:  03/06/19 124.2 kg  02/20/19 (!) 137 kg  02/18/19 (!) 137 kg     Intake/Output  Summary (Last 24 hours) at 03/07/2019 1604 Last data filed at 03/07/2019 1500 Gross per 24 hour  Intake 1120 ml  Output 1000 ml  Net 120 ml     Physical Exam  Awake Alert, during in recliner, in no apparent distress  symmetrical Chest wall movement, Good air movement  bilaterally, scattered rales. RRR,No Gallops,Rubs or new Murmurs, No Parasternal Heave +ve B.Sounds, Abd Soft, No tenderness, No rebound - guarding or rigidity. No Cyanosis, Clubbing or edema, No new Rash or bruise      Data Review:    CBC Recent Labs  Lab 03/02/19 0455 03/02/19 0649 03/03/19 0450 03/04/19 0520 03/05/19 0350 03/06/19 0303  WBC 10.6*  --  12.1* 11.3* 11.4* 11.7*  HGB 14.0 13.9 14.5 15.6 15.8 15.9  HCT 43.6 41.0 44.9 48.4 48.7 49.8  PLT 363  --  361 356 323 321  MCV 94.6  --  94.7 94.2 93.8 94.5  MCH 30.4  --  30.6 30.4 30.4 30.2  MCHC 32.1  --  32.3 32.2 32.4 31.9  RDW 13.4  --  13.3 13.3 13.4 13.4  LYMPHSABS 0.4*  --  0.5* 0.5* 0.8 0.7  MONOABS 0.5  --  0.3 0.5 0.4 0.4  EOSABS 0.0  --  0.0 0.0 0.3 0.6*  BASOSABS 0.0  --  0.0 0.0 0.0 0.0    Chemistries  Recent Labs  Lab 03/03/19 0450 03/04/19 0520 03/05/19 0350 03/06/19 0303 03/07/19 0250  NA 142 144 143 142 143  K 4.7 4.5 3.9 3.5 4.0  CL 106 106 103 102 101  CO2 _0 GLUCOSE 213* 184* 178* 146* 139*  BUN 35* 42* 55* 77* 80*  CREATININE 0.84 0.89 1.12 1.63* 1.58*  CALCIUM 9.1 9.4 9.0 8.7* 9.1  MG 2.4 2.6* 2.6* 2.9* 2.9*  AST 34 _1 ALT 55* 53* 50* 46* 46*  ALKPHOS 105 115 104 102 96  BILITOT 1.0 0.9 1.0 1.1 1.0   ------------------------------------------------------------------------------------------------------------------ No results for input(s): CHOL, HDL, LDLCALC, TRIG, CHOLHDL, LDLDIRECT in the last 72 hours.  Lab Results  Component Value Date   HGBA1C 7.0 (H) 02/28/2019   ------------------------------------------------------------------------------------------------------------------ No results for input(s): TSH, T4TOTAL, T3FREE, THYROIDAB in the last 72 hours.  Invalid input(s): FREET3 ------------------------------------------------------------------------------------------------------------------ Recent Labs    03/05/19 0350  FERRITIN 467*     Coagulation profile No results for input(s): INR, PROTIME in the last 168 hours.  Recent Labs    03/05/19 0350  DDIMER 2.01*    Cardiac Enzymes No results for input(s): CKMB, TROPONINI, MYOGLOBIN in the last 168 hours.  Invalid input(s): CK ------------------------------------------------------------------------------------------------------------------    Component Value Date/Time   BNP 116.4 (H) 03/05/2019 0350    Inpatient Medications  Scheduled Meds:  aspirin EC  81 mg Oral Daily   Chlorhexidine Gluconate Cloth  6 each Topical Daily   enoxaparin (LOVENOX) injection  60 mg Subcutaneous Q12H   fluticasone  2 spray Each Nare Daily   guaiFENesin  1,200 mg Oral BID   insulin aspart  0-20 Units Subcutaneous TID WC   insulin aspart  6 Units Subcutaneous TID WC   insulin detemir  18 Units Subcutaneous QHS   insulin detemir  6 Units Subcutaneous Daily   latanoprost  1 drop Both Eyes QHS   loratadine  10 mg Oral Daily   mouth rinse  15 mL Mouth Rinse BID   methylPREDNISolone (SOLU-MEDROL) injection  30 mg Intravenous Daily   omega-3 acid ethyl esters  1 g Oral Daily   pantoprazole  40 mg Oral Daily   QUEtiapine  50 mg Oral QHS   rosuvastatin  10 mg Oral Daily   timolol  1 drop Left Eye BID   traZODone  25 mg Oral QHS   umeclidinium-vilanterol  1 puff Inhalation Daily   Continuous Infusions:  PRN Meds:.acetaminophen, albuterol, ALPRAZolam, haloperidol lactate, morphine injection, ondansetron (ZOFRAN) IV, polyethylene glycol, sodium chloride  Micro Results Recent Results (from the past 240 hour(s))  Blood culture (routine x 2)     Status: None   Collection Time: 02/12/2019 11:48 AM   Specimen: BLOOD  Result Value Ref Range Status   Specimen Description   Final    BLOOD RIGHT ANTECUBITAL Performed at [facility] [address], [place], [place] [address]    Special Requests   Final    BOTTLES DRAWN AEROBIC AND ANAEROBIC  Blood Culture results may not be optimal due to an excessive volume of blood received in culture bottles Performed at [facility] [address] [place], [place] [address]    Culture   Final    NO GROWTH 5 DAYS Performed at [facility], [place] [address] [place], [place] [address]    Report Status 03/04/2019 FINAL  Final  Blood culture (routine x 2)     Status: None   Collection Time: 02/05/2019 11:50 AM   Specimen: BLOOD RIGHT HAND  Result Value Ref Range Status   Specimen Description   Final    BLOOD RIGHT HAND Performed at [facility] [address] [place], [place] [address]    Special Requests   Final    BOTTLES DRAWN AEROBIC AND ANAEROBIC Blood Culture results may not be optimal due to an excessive volume of blood received in culture bottles Performed at [facility] [address], [place], [place] [address]    Culture   Final    NO GROWTH 5 DAYS Performed at [facility], [place] [address] [place], [place] [address]    Report Status 03/04/2019 FINAL  Final  SARS CORONAVIRUS 2 (TAT 6-24 HRS) Nasopharyngeal Nasopharyngeal Swab     Status: Abnormal   Collection Time: 02/25/2019  2:00 PM   Specimen: Nasopharyngeal Swab  Result Value Ref Range Status   SARS Coronavirus 2 POSITIVE (A) NEGATIVE Final    Comment: RESULT CALLED TO, READ BACK BY AND VERIFIED WITH: K.GARCIA RN 1950 02/02/2019 MCCORMICK K (NOTE) SARS-CoV-2 target nucleic acids are DETECTED. The SARS-CoV-2 RNA is generally detectable in upper and lower respiratory specimens during the acute phase of infection. Positive results are indicative of the presence of SARS-CoV-2 RNA. Clinical correlation with patient history and other diagnostic information is  necessary to determine patient infection status. Positive results do not rule out bacterial infection or co-infection with other viruses.  The expected result is Negative. Fact Sheet for  Patients: SugarRoll.be Fact Sheet for Healthcare Providers: https://www.woods-mathews.com/ This test is not yet approved or cleared by the [place] FDA and  has been authorized for detection and/or diagnosis of SARS-CoV-2 by FDA under an Emergency Use Authorization (EUA). This EUA will remain  in effect (meaning this test can be used) for  the duration of the COVID-19 declaration under Section 564(b)(1) of the Act, 21 U.S.C. section 360bbb-3(b)(1), unless the authorization is terminated or revoked sooner. Performed at [facility], [place] [address] [place], [place] [address]   Culture, sputum-assessment  Status: None   Collection Time: 02/12/2019  4:04 PM   Specimen: Sputum  Result Value Ref Range Status   Specimen Description SPUTUM  Final   Special Requests NONE  Final   Sputum evaluation   Final    Sputum specimen not acceptable for testing.  Please recollect.   RESULT CALLED TO, READ BACK BY AND VERIFIED WITH: I [facility] 02/28/19 2116 RHOLMES Performed at [facility], [place] [address], [place], [place] [address]    Report Status 02/28/2019 FINAL  Final  Respiratory Panel by PCR     Status: None   Collection Time: 02/15/2019  6:45 PM   Specimen: Flu Kit Nasopharyngeal Swab; Respiratory  Result Value Ref Range Status   Adenovirus NOT DETECTED NOT DETECTED Final   Coronavirus 229E NOT DETECTED NOT DETECTED Final    Comment: (NOTE) The Coronavirus on the Respiratory Panel, DOES NOT test for the novel  Coronavirus (2019 nCoV)    Coronavirus HKU1 NOT DETECTED NOT DETECTED Final   Coronavirus NL63 NOT DETECTED NOT DETECTED Final   Coronavirus OC43 NOT DETECTED NOT DETECTED Final   Metapneumovirus NOT DETECTED NOT DETECTED Final   Rhinovirus / Enterovirus NOT DETECTED NOT DETECTED Final   Influenza A NOT DETECTED NOT DETECTED Final   Influenza B NOT DETECTED NOT DETECTED Final   Parainfluenza Virus 1 NOT  DETECTED NOT DETECTED Final   Parainfluenza Virus 2 NOT DETECTED NOT DETECTED Final   Parainfluenza Virus 3 NOT DETECTED NOT DETECTED Final   Parainfluenza Virus 4 NOT DETECTED NOT DETECTED Final   Respiratory Syncytial Virus NOT DETECTED NOT DETECTED Final   Bordetella pertussis NOT DETECTED NOT DETECTED Final   Chlamydophila pneumoniae NOT DETECTED NOT DETECTED Final   Mycoplasma pneumoniae NOT DETECTED NOT DETECTED Final    Comment: Performed at [facility], [place] [address], [place], [place] [address]  MRSA PCR Screening     Status: None   Collection Time: 02/28/2019  6:45 PM   Specimen: Nasal Mucosa; Nasopharyngeal  Result Value Ref Range Status   MRSA by PCR NEGATIVE NEGATIVE Final    Comment:        The GeneXpert MRSA Assay (FDA approved for NASAL specimens only), is one component of a comprehensive MRSA colonization surveillance program. It is not intended to diagnose MRSA infection nor to guide or monitor treatment for MRSA infections. Performed at [facility], [place] [address], [place], [place] [address]   Urine culture     Status: None   Collection Time: 02/09/2019  7:15 PM   Specimen: Urine, Random  Result Value Ref Range Status   Specimen Description   Final    URINE, RANDOM Performed at [facility] [address], [place], [place] [address]    Special Requests   Final    NONE Performed at [facility], [place] [address] [place], [place] [address]    Culture   Final    NO GROWTH Performed at [facility], [place] [address] [place], [place] [address]    Report Status 03/01/2019 FINAL  Final    Radiology Reports Dg Chest 2 View  Result Date: 02/20/2019 CLINICAL DATA:  Interstitial lung disease, COPD, diabetes mellitus, hypertension EXAM: CHEST - 2 VIEW COMPARISON:  02/11/2019, 02/09/2019 FINDINGS: Enlargement of cardiac silhouette. Mediastinal contours and pulmonary vascularity normal. Mild  accentuation of interstitial markings in the periphery of both lungs. No acute infiltrate, pleural effusion or pneumothorax. Osseous structures unremarkable. IMPRESSION: Peripheral  interstitial prominence in the lungs bilaterally, slightly greater at bases. No definite acute infiltrate. Mild enlargement of cardiac silhouette. Electronically Signed   By: Lavonia Dana M.D.   On: 02/20/2019 13:08   Dg Chest 2 View  Result Date: 02/09/2019 CLINICAL DATA:  Pre right lung biopsy evaluation. Pulmonary fibrosis. Ex-smoker. EXAM: CHEST - 2 VIEW COMPARISON:  Chest CT dated 09/22/2018. Chest radiographs dated 07/25/2012 FINDINGS: The cardiac silhouette is borderline enlarged. The aorta is tortuous. Stable minimal linear scarring at the left lung base with interval minimal linear scarring in the left mid lung zone. Mild diffuse peribronchial thickening with improvement. Thoracic and upper lumbar spine degenerative changes. IMPRESSION: 1. No acute abnormality. 2. Mild chronic bronchitic changes. Electronically Signed   By: Claudie Revering M.D.   On: 02/09/2019 09:32   Ct Angio Chest Pe W And/or Wo Contrast  Result Date: 02/22/2019 CLINICAL DATA:  PE suspected, intermediate prob, neg D-dimer. Cough and shortness of breath. Recent VATS lung biopsy 02/09/2019. EXAM: CT ANGIOGRAPHY CHEST WITH CONTRAST TECHNIQUE: Multidetector CT imaging of the chest was performed using the standard protocol during bolus administration of intravenous contrast. Multiplanar CT image reconstructions and MIPs were obtained to evaluate the vascular anatomy. CONTRAST:  127m OMNIPAQUE IOHEXOL 350 MG/ML SOLN COMPARISON:  Radiographs earlier this day. Chest CT 09/22/2018 FINDINGS: Cardiovascular: Contrast bolus timing and breathing motion artifact limits detailed assessment. No central filling defects in the pulmonary arteries, evaluation is diagnostic to the lobar level. There is opacification of multiple segmental and subsegmental branches, however  more detailed assessment is limited. Multi chamber cardiomegaly. Minor aortic atherosclerosis without aneurysm. No aortic dissection. No pericardial effusion. There is contrast refluxing into the hepatic veins and IVC. Mediastinum/Nodes: No enlarged mediastinal, hilar, or axillary lymph nodes. Patulous esophagus without wall thickening. No visualized thyroid nodule. Lungs/Pleura: Ground-glass opacities, fairly diffuse throughout the left lung and involving the right lower lobe. Patchy involvement of the right upper and right middle lobes. Findings are new from prior exam. There is no definite septal thickening. No pneumothorax or confluent airspace disease. No significant pleural fluid. Trachea and mainstem bronchi are patent. Pulmonary nodules on prior exam are obscured by motion. Emphysema. Upper Abdomen: Contrast refluxes into the hepatic veins and IVC. Right renal cyst partially included. Suspected hepatic steatosis. Musculoskeletal: Subcutaneous emphysema throughout the right chest wall which tracks posteriorly and anteriorly chest to the left of midline. This is likely related to recent VATS. Unchanged rounded sclerotic density within T10. degenerative change in the spine. Review of the MIP images confirms the above findings. IMPRESSION: 1. Ground-glass opacities throughout both lungs, diffuse on the left and more patchy in geographic on the right. Findings may represent asymmetric pulmonary edema or infection, including atypical viral organisms. 2. Mild cardiomegaly. Reflux of contrast into the hepatic veins and IVC consistent with elevated right heart pressures. 3. Limited assessment for pulmonary embolus given contrast bolus timing and patient motion, no large central pulmonary embolism. 4. Subcutaneous emphysema in the right chest wall likely related to recent VATS. No pneumothorax. Aortic Atherosclerosis (ICD10-I70.0). Emphysema (ICD10-J43.9). Electronically Signed   By: MKeith RakeM.D.   On:  02/14/2019 17:08   Dg Chest Port 1 View  Result Date: 02/10/2019 CLINICAL DATA:  Cough dyspnea. EXAM: PORTABLE CHEST 1 VIEW COMPARISON:  CT chest 09/22/2018, chest x-ray of February 20, 2019 FINDINGS: Heart size is enlarged as before. Bibasilar opacities have developed since the 02/20/2019 with dense retrocardiac opacification and with more interstitial and subtle alveolar opacities in the right  chest. Patient reportedly had a lung biopsy. There is no visible pneumothorax on this semi erect view. Subcutaneous emphysema is noted along the right lateral chest. No acute bone finding. IMPRESSION: 1. Findings that may represent multifocal pneumonia or asymmetric edema. 2. Consolidation or volume loss at the left lung base greater than right. 3. Pulmonary hemorrhage considered on the right given the patient's history of recent right-sided biopsy. 4. No signs of pneumothorax on semi erect view though there is subcutaneous emphysema along the right chest wall. Occult pneumothorax is not excluded. Electronically Signed   By: Zetta Bills M.D.   On: 02/16/2019 11:35   Dg Chest Port 1 View  Result Date: 02/11/2019 CLINICAL DATA:  Chest tube removal EXAM: PORTABLE CHEST 1 VIEW COMPARISON:  February 10, 2019 FINDINGS: Interval removal of right chest tube with new right chest wall subcutaneous emphysema. No definite pneumothorax. Low lung volumes with bibasilar atelectasis. Stable cardiomediastinal contours. IMPRESSION: Post right chest tube removal with new subcutaneous emphysema. No definite pneumothorax. Electronically Signed   By: Macy Mis M.D.   On: 02/11/2019 08:14   Dg Chest Port 1 View  Result Date: 02/10/2019 CLINICAL DATA:  Patient status post right upper, middle and lower lobe wedge resection 02/09/2019. Chest tube in place. EXAM: PORTABLE CHEST 1 VIEW COMPARISON:  Single-view of the chest 02/09/2019. FINDINGS: Right chest tube remains in place. No pneumothorax. Subsegmental atelectasis in the  right lung base has increased. The left lung is clear. Heart size is normal. IMPRESSION: Negative for pneumothorax with a right chest tube in place. Increased subsegmental atelectasis right lung base. Electronically Signed   By: Inge Rise M.D.   On: 02/10/2019 06:43   Dg Chest Port 1 View  Result Date: 02/09/2019 CLINICAL DATA:  Follow-up lung surgery EXAM: PORTABLE CHEST 1 VIEW COMPARISON:  02/09/2019 FINDINGS: Insertion of right-sided chest tube with tip at the apex. No visible pneumothorax. Mild cardiomegaly. Scarring at the left base. Right mid lung platelike atelectasis. No pleural effusion or consolidation. Coarse chronic interstitial opacity. Small amount of right lower chest wall emphysema. IMPRESSION: 1. Insertion of right-sided chest tube with tip at the apex, no visible pneumothorax 2. Mild cardiomegaly. Minimal platelike atelectasis in the right mid lung. Scarring at the left base Electronically Signed   By: Donavan Foil M.D.   On: 02/09/2019 16:15       Phillips Climes M.D on 03/07/2019 at 4:04 PM  Between 7am to 7pm - Pager - [phone]  After 7pm go to [email or web address] - password [facility]  [facility] -  Office  [phone]

## 2019-03-08 LAB — CBC
HCT: 48.6 % (ref 39.0–52.0)
Hemoglobin: 15.6 g/dL (ref 13.0–17.0)
MCH: 30.6 pg (ref 26.0–34.0)
MCHC: 32.1 g/dL (ref 30.0–36.0)
MCV: 95.3 fL (ref 80.0–100.0)
Platelets: 250 10*3/uL (ref 150–400)
RBC: 5.1 MIL/uL (ref 4.22–5.81)
RDW: 13.4 % (ref 11.5–15.5)
WBC: 11.6 10*3/uL — ABNORMAL HIGH (ref 4.0–10.5)
nRBC: 0 % (ref 0.0–0.2)

## 2019-03-08 LAB — C-REACTIVE PROTEIN: CRP: 0.5 mg/dL (ref <1.0)

## 2019-03-08 LAB — COMPREHENSIVE METABOLIC PANEL
ALT: 44 U/L (ref 0–44)
AST: 22 U/L (ref 15–41)
Albumin: 3.3 g/dL — ABNORMAL LOW (ref 3.5–5.0)
Alkaline Phosphatase: 90 U/L (ref 38–126)
Anion gap: 11 (ref 5–15)
BUN: 75 mg/dL — ABNORMAL HIGH (ref 8–23)
CO2: 31 mmol/L (ref 22–32)
Calcium: 9.3 mg/dL (ref 8.9–10.3)
Chloride: 98 mmol/L (ref 98–111)
Creatinine, Ser: 1.4 mg/dL — ABNORMAL HIGH (ref 0.61–1.24)
GFR calc Af Amer: 56 mL/min — ABNORMAL LOW (ref >60)
GFR calc non Af Amer: 48 mL/min — ABNORMAL LOW (ref >60)
Glucose, Bld: 168 mg/dL — ABNORMAL HIGH (ref 70–99)
Potassium: 4.3 mmol/L (ref 3.5–5.1)
Sodium: 140 mmol/L (ref 135–145)
Total Bilirubin: 1.3 mg/dL — ABNORMAL HIGH (ref 0.3–1.2)
Total Protein: 6.4 g/dL — ABNORMAL LOW (ref 6.5–8.1)

## 2019-03-08 LAB — GLUCOSE, CAPILLARY
Glucose-Capillary: 171 mg/dL — ABNORMAL HIGH (ref 70–99)
Glucose-Capillary: 291 mg/dL — ABNORMAL HIGH (ref 70–99)
Glucose-Capillary: 237 mg/dL — ABNORMAL HIGH (ref 70–99)
Glucose-Capillary: 294 mg/dL — ABNORMAL HIGH (ref 70–99)

## 2019-03-08 LAB — D-DIMER, QUANTITATIVE: D-Dimer, Quant: 1.95 ug/mL-FEU — ABNORMAL HIGH (ref 0.00–0.50)

## 2019-03-08 NOTE — Progress Notes (Signed)
0131- Patient attempted to get OOB, HHFNC and NRB were off, O2 sats 30%,  patient was drowsy, skin cool and clammy. Applied HHFNC and NRB back on patient, O2 sats promptly increased to 95%, patient oriented to self,location and time.   [id number] Paged Dr. Myna Hidalgo and notified of events occurred.   17 Patient's wife and daughter called and notified of events occurred,all questions answered.

## 2019-03-08 NOTE — Progress Notes (Signed)
PROGRESS NOTE                                                                                                                                                                                                             Patient Demographics:    Justin Atkinson, is a 78 y.o. male, DOB - [birth year], NAT:[id number]  Admit date - 02/04/2019   Admitting Physician Eugenie Filler, MD  Outpatient Primary MD for the patient is [name], MD  LOS - 9   No chief complaint on file.      Brief Narrative    78 year old  WM PMHx COPD/pulmonary fibrosis recently diagnosed hypersensitivity pneumonitis (had VATS wedge resection on 11/9,follows with Dr. Vaughan Browner) who was recently started on steroid on 11/18, HTN, diabetes type 2 uncontrolled with complication, HLD, and DJD . -Patient admitted to ICU given profound hypoxia, still requiring heated high flow oxygen together with nonrebreather as well, received Actemra, plasma, treated with remdesivir and steroids, patient remains in ICU, still with significant oxygen requirement, with no improvement.   Subjective:    Justin Atkinson with one episode of confusion and hypoxia overnight as he was trying to stand up out of the bed without his oxygen supplement, otherwise no significant events, he himself this morning denies any complaints .   Assessment  & Plan :    Principal Problem:   Acute respiratory failure with hypoxia (HCC) Active Problems:   Hypertension   Hypercholesterolemia   Benign prostate hyperplasia   ILD (interstitial lung disease) (HCC)   Postinflammatory pulmonary fibrosis (HCC)   HCAP (healthcare-associated pneumonia)   Chronic obstructive pulmonary disease (HCC)   Pneumonia due to COVID-19 virus   Severe sepsis (HCC)   Essential hypertension   Diabetes mellitus type 2, controlled, with complications ([place])   Pulmonary fibrosis (HCC)   Hypersensitivity pneumonitis ([place])   Acute hypoxic  respiratory failure due to COVID-19 pneumonia -Patient remains with significant hypoxia, improved oxygen requirement, this morning he remains on heated high flow nasal cannula, but he requires NRB intermittently not continuous . -Patient with underlying hypersensitivity pneumonitis, and history of ILD. -Remains in ICU given high oxygen requirement. -Received convalescent plasma 11/30 -Received remdesivir x5 days -Received Actemra 11/29 -Patient encouraged to use incentive spirometry, flutter valve, and to do modified proning . -IV diuresis currently on hold  given worsening renal function.   COVID-19 Labs  Recent Labs    03/06/19 0303 03/07/19 0250 03/08/19 0442  DDIMER  --   --  1.95*  CRP 0.7 0.6 0.5    Lab Results  Component Value Date   SARSCOV2NAA POSITIVE (A) 02/08/2019   SARSCOV2NAA NOT DETECTED 02/05/2019   [place] NOT DETECTED 01/09/2019   Hypersensivitivy pneumonitis/ Hx of ILD -Following with pulmonary Dr. Vaughan Browner as an outpatient -Diagnosed with recent biopsy - Prednisone 27m daily at discharge until seen in pulmonary clinic per PCCM.  He is on IV Solu-Medrol 30 mg IV daily.  AKI -Patient with known solitary kidney.  Renal function increasing with diuresis, so it has been stopped, creatinine down to 1.4 today. -Antihypertensive medication has been stopped as well  Hypertension -Given blood pressure on the lower side, medication has been on hold given irbesartan, hydrochlorothiazide and metoprolol .  Hyperlipidemia -Continue with statin  Acute metabolic encephalopathy/hospital delirium -Patient with significant agitation/confusion, where he was started on Precedex drip, is currently improved, off Precedex drip, on Seroquel nightly and as needed Haldol.  Diabetes mellitus controlled -11/28 hemoglobin A1c= 7.0 -Continue with current regimen, overall CBGs are acceptable .  Code Status : DNR,   Family Communication  : Daughter updated daily  Disposition  Plan  : remains in ICU  Consults  :  PCCM  Procedures  : None  DVT Prophylaxis  :  [place] lovenox  Lab Results  Component Value Date   PLT 250 03/08/2019    Antibiotics  :    Anti-infectives (From admission, onward)   Start     Dose/Rate Route Frequency Ordered Stop   03/01/19 0800  remdesivir 100 mg in sodium chloride 0.9 % 250 mL IVPB     100 mg 500 mL/hr over 30 Minutes Intravenous Every 24 hours 02/28/19 0823 03/04/19 1216   02/28/19 1000  remdesivir 200 mg in sodium chloride 0.9 % 250 mL IVPB     200 mg 500 mL/hr over 30 Minutes Intravenous Once 02/28/19 0823 02/28/19 1006   02/28/19 0000  vancomycin (VANCOCIN) 1,250 mg in sodium chloride 0.9 % 250 mL IVPB  Status:  Discontinued     1,250 mg 166.7 mL/hr over 90 Minutes Intravenous Every 12 hours 02/20/2019 1639 02/22/2019 2037   02/13/2019 2200  ceFEPIme (MAXIPIME) 2 g in sodium chloride 0.9 % 100 mL IVPB  Status:  Discontinued     2 g 200 mL/hr over 30 Minutes Intravenous Every 8 hours 02/20/2019 1629 03/03/19 1242   02/04/2019 1315  ceFEPIme (MAXIPIME) 2 g in sodium chloride 0.9 % 100 mL IVPB     2 g 200 mL/hr over 30 Minutes Intravenous  Once 02/26/2019 1306 02/28/19 0324   02/13/2019 1315  vancomycin (VANCOCIN) 2,000 mg in sodium chloride 0.9 % 500 mL IVPB     2,000 mg 250 mL/hr over 120 Minutes Intravenous  Once 02/21/2019 1311 02/28/19 0324        Objective:   Vitals:   03/08/19 0833 03/08/19 1055 03/08/19 1200 03/08/19 1239  BP: 126/63  133/72 133/72  Pulse: 76 74 73   Resp: (!) 21 (!) 24 20   Temp:   97.8 F (36.6 C) 97.8 F (36.6 C)  TempSrc:   Tympanic   SpO2: 91% 96% 96%   Weight:      Height:        Wt Readings from Last 3 Encounters:  03/06/19 124.2 kg  02/20/19 (!) 137 kg  02/18/19 (!) 137  kg     Intake/Output Summary (Last 24 hours) at 03/08/2019 1428 Last data filed at 03/08/2019 0538 Gross per 24 hour  Intake 100 ml  Output 1175 ml  Net -1075 ml     Physical Exam  Awake Alert, Oriented X 3, No  new F.N deficits, Normal affect, sitting in recliner Symmetrical Chest wall movement, Good air movement bilaterally, scattered rails RRR,No Gallops,Rubs or new Murmurs, No Parasternal Heave +ve B.Sounds, Abd Soft, No tenderness, No rebound - guarding or rigidity. No Cyanosis, Clubbing or edema, No new Rash or bruise       Data Review:    CBC Recent Labs  Lab 03/02/19 0455  03/03/19 0450 03/04/19 0520 03/05/19 0350 03/06/19 0303 03/08/19 0442  WBC 10.6*  --  12.1* 11.3* 11.4* 11.7* 11.6*  HGB 14.0   < > 14.5 15.6 15.8 15.9 15.6  HCT 43.6   < > 44.9 48.4 48.7 49.8 48.6  PLT 363  --  361 356 323 321 250  MCV 94.6  --  94.7 94.2 93.8 94.5 95.3  MCH 30.4  --  30.6 30.4 30.4 30.2 30.6  MCHC 32.1  --  32.3 32.2 32.4 31.9 32.1  RDW 13.4  --  13.3 13.3 13.4 13.4 13.4  LYMPHSABS 0.4*  --  0.5* 0.5* 0.8 0.7  --   MONOABS 0.5  --  0.3 0.5 0.4 0.4  --   EOSABS 0.0  --  0.0 0.0 0.3 0.6*  --   BASOSABS 0.0  --  0.0 0.0 0.0 0.0  --    < > = values in this interval not displayed.    Chemistries  Recent Labs  Lab 03/03/19 0450 03/04/19 0520 03/05/19 0350 03/06/19 0303 03/07/19 0250 03/08/19 0442  NA 142 144 143 142 143 140  K 4.7 4.5 3.9 3.5 4.0 4.3  CL 106 106 103 102 101 98  CO2 _0 GLUCOSE 213* 184* 178* 146* 139* 168*  BUN 35* 42* 55* 77* 80* 75*  CREATININE 0.84 0.89 1.12 1.63* 1.58* 1.40*  CALCIUM 9.1 9.4 9.0 8.7* 9.1 9.3  MG 2.4 2.6* 2.6* 2.9* 2.9*  --   AST 34 _1 ALT 55* 53* 50* 46* 46* 44  ALKPHOS 105 115 104 102 96 90  BILITOT 1.0 0.9 1.0 1.1 1.0 1.3*   ------------------------------------------------------------------------------------------------------------------ No results for input(s): CHOL, HDL, LDLCALC, TRIG, CHOLHDL, LDLDIRECT in the last 72 hours.  Lab Results  Component Value Date   HGBA1C 7.0 (H) 02/28/2019    ------------------------------------------------------------------------------------------------------------------ No results for input(s): TSH, T4TOTAL, T3FREE, THYROIDAB in the last 72 hours.  Invalid input(s): FREET3 ------------------------------------------------------------------------------------------------------------------ No results for input(s): VITAMINB12, FOLATE, FERRITIN, TIBC, IRON, RETICCTPCT in the last 72 hours.  Coagulation profile No results for input(s): INR, PROTIME in the last 168 hours.  Recent Labs    03/08/19 0442  DDIMER 1.95*    Cardiac Enzymes No results for input(s): CKMB, TROPONINI, MYOGLOBIN in the last 168 hours.  Invalid input(s): CK ------------------------------------------------------------------------------------------------------------------    Component Value Date/Time   BNP 116.4 (H) 03/05/2019 0350    Inpatient Medications  Scheduled Meds:  aspirin EC  81 mg Oral Daily   Chlorhexidine Gluconate Cloth  6 each Topical Daily   enoxaparin (LOVENOX) injection  60 mg Subcutaneous Q12H   fluticasone  2 spray Each Nare Daily   guaiFENesin  1,200 mg Oral BID   insulin aspart  0-20 Units Subcutaneous TID WC   insulin  aspart  6 Units Subcutaneous TID WC   insulin detemir  18 Units Subcutaneous QHS   insulin detemir  6 Units Subcutaneous Daily   latanoprost  1 drop Both Eyes QHS   loratadine  10 mg Oral Daily   mouth rinse  15 mL Mouth Rinse BID   methylPREDNISolone (SOLU-MEDROL) injection  30 mg Intravenous Daily   omega-3 acid ethyl esters  1 g Oral Daily   pantoprazole  40 mg Oral Daily   QUEtiapine  50 mg Oral QHS   rosuvastatin  10 mg Oral Daily   timolol  1 drop Left Eye BID   traZODone  25 mg Oral QHS   umeclidinium-vilanterol  1 puff Inhalation Daily   Continuous Infusions:  PRN Meds:.acetaminophen, albuterol, ALPRAZolam, haloperidol lactate, morphine injection, ondansetron (ZOFRAN) IV, polyethylene  glycol, sodium chloride  Micro Results Recent Results (from the past 240 hour(s))  Blood culture (routine x 2)     Status: None   Collection Time: 02/20/2019 11:48 AM   Specimen: BLOOD  Result Value Ref Range Status   Specimen Description   Final    BLOOD RIGHT ANTECUBITAL Performed at [facility], [place] [address], [place], [place] [address]    Special Requests   Final    BOTTLES DRAWN AEROBIC AND ANAEROBIC Blood Culture results may not be optimal due to an excessive volume of blood received in culture bottles Performed at [facility] [address], [place], [place] [address]    Culture   Final    NO GROWTH 5 DAYS Performed at [facility], [place] [address] [place], [place] [address]    Report Status 03/04/2019 FINAL  Final  Blood culture (routine x 2)     Status: None   Collection Time: 02/08/2019 11:50 AM   Specimen: BLOOD RIGHT HAND  Result Value Ref Range Status   Specimen Description   Final    BLOOD RIGHT HAND Performed at [facility] [address], [place], [place] [address]    Special Requests   Final    BOTTLES DRAWN AEROBIC AND ANAEROBIC Blood Culture results may not be optimal due to an excessive volume of blood received in culture bottles Performed at [facility] [address], [place], [place] [address]    Culture   Final    NO GROWTH 5 DAYS Performed at [facility], [place] [address] [place], [place] [address]    Report Status 03/04/2019 FINAL  Final  SARS CORONAVIRUS 2 (TAT 6-24 HRS) Nasopharyngeal Nasopharyngeal Swab     Status: Abnormal   Collection Time: 02/18/2019  2:00 PM   Specimen: Nasopharyngeal Swab  Result Value Ref Range Status   SARS Coronavirus 2 POSITIVE (A) NEGATIVE Final    Comment: RESULT CALLED TO, READ BACK BY AND VERIFIED WITH: K.GARCIA RN 1950 02/19/2019 M[place]ORMICK K (NOTE) SARS-CoV-2 target nucleic acids are DETECTED. The SARS-CoV-2 RNA is  generally detectable in upper and lower respiratory specimens during the acute phase of infection. Positive results are indicative of the presence of SARS-CoV-2 RNA. Clinical correlation with patient history and other diagnostic information is  necessary to determine patient infection status. Positive results do not rule out bacterial infection or co-infection with other viruses.  The expected result is Negative. Fact Sheet for Patients: SugarRoll.be Fact Sheet for Healthcare Providers: https://www.woods-mathews.com/ This test is not yet approved or cleared by the [place] FDA and  has been authorized for detection and/or diagnosis of  SARS-CoV-2 by FDA under an Emergency Use Authorization (EUA). This EUA will remain  in effect (meaning this test can be used) for  the duration of the COVID-19 declaration under Section 564(b)(1) of the Act, 21 U.S.C. section 360bbb-3(b)(1), unless the authorization is terminated or revoked sooner. Performed at [facility], [place] [address] [place], [place] [address]   Culture, sputum-assessment     Status: None   Collection Time: 02/20/2019  4:04 PM   Specimen: Sputum  Result Value Ref Range Status   Specimen Description SPUTUM  Final   Special Requests NONE  Final   Sputum evaluation   Final    Sputum specimen not acceptable for testing.  Please recollect.   RESULT CALLED TO, READ BACK BY AND VERIFIED WITH: I [facility] 02/28/19 2116 RHOLMES Performed at [facility], [place] [address] [place], [place] [address]    Report Status 02/28/2019 FINAL  Final  Respiratory Panel by PCR     Status: None   Collection Time: 02/20/2019  6:45 PM   Specimen: Flu Kit Nasopharyngeal Swab; Respiratory  Result Value Ref Range Status   Adenovirus NOT DETECTED NOT DETECTED Final   Coronavirus 229E NOT DETECTED NOT DETECTED Final    Comment: (NOTE) The Coronavirus on the Respiratory Panel, DOES NOT test  for the novel  Coronavirus (2019 nCoV)    Coronavirus HKU1 NOT DETECTED NOT DETECTED Final   Coronavirus NL63 NOT DETECTED NOT DETECTED Final   Coronavirus OC43 NOT DETECTED NOT DETECTED Final   Metapneumovirus NOT DETECTED NOT DETECTED Final   Rhinovirus / Enterovirus NOT DETECTED NOT DETECTED Final   Influenza A NOT DETECTED NOT DETECTED Final   Influenza B NOT DETECTED NOT DETECTED Final   Parainfluenza Virus 1 NOT DETECTED NOT DETECTED Final   Parainfluenza Virus 2 NOT DETECTED NOT DETECTED Final   Parainfluenza Virus 3 NOT DETECTED NOT DETECTED Final   Parainfluenza Virus 4 NOT DETECTED NOT DETECTED Final   Respiratory Syncytial Virus NOT DETECTED NOT DETECTED Final   Bordetella pertussis NOT DETECTED NOT DETECTED Final   Chlamydophila pneumoniae NOT DETECTED NOT DETECTED Final   Mycoplasma pneumoniae NOT DETECTED NOT DETECTED Final    Comment: Performed at [facility], [place], [place], [place] [address]  MRSA PCR Screening     Status: None   Collection Time: 03/02/2019  6:45 PM   Specimen: Nasal Mucosa; Nasopharyngeal  Result Value Ref Range Status   MRSA by PCR NEGATIVE NEGATIVE Final    Comment:        The GeneXpert MRSA Assay (FDA approved for NASAL specimens only), is one component of a comprehensive MRSA colonization surveillance program. It is not intended to diagnose MRSA infection nor to guide or monitor treatment for MRSA infections. Performed at [facility], [place] [address] [place], [place] [address]   Urine culture     Status: None   Collection Time: 02/12/2019  7:15 PM   Specimen: Urine, Random  Result Value Ref Range Status   Specimen Description   Final    URINE, RANDOM Performed at [facility] [address], [place], [place] [address]    Special Requests   Final    NONE Performed at [facility], [place] [address], [place], [place] [address]    Culture   Final    NO  GROWTH Performed at [facility], [place] [address] [place], [place] [address]    Report Status 03/01/2019  FINAL  Final    Radiology Reports Dg Chest 2 View  Result Date: 02/20/2019 CLINICAL DATA:  Interstitial lung disease, COPD, diabetes mellitus, hypertension EXAM: CHEST - 2 VIEW COMPARISON:  02/11/2019, 02/09/2019 FINDINGS: Enlargement of cardiac silhouette. Mediastinal contours and pulmonary vascularity normal. Mild accentuation of interstitial markings in the periphery of both lungs. No acute infiltrate, pleural effusion or pneumothorax. Osseous structures unremarkable. IMPRESSION: Peripheral interstitial prominence in the lungs bilaterally, slightly greater at bases. No definite acute infiltrate. Mild enlargement of cardiac silhouette. Electronically Signed   By: Lavonia Dana M.D.   On: 02/20/2019 13:08   Dg Chest 2 View  Result Date: 02/09/2019 CLINICAL DATA:  Pre right lung biopsy evaluation. Pulmonary fibrosis. Ex-smoker. EXAM: CHEST - 2 VIEW COMPARISON:  Chest CT dated 09/22/2018. Chest radiographs dated 07/25/2012 FINDINGS: The cardiac silhouette is borderline enlarged. The aorta is tortuous. Stable minimal linear scarring at the left lung base with interval minimal linear scarring in the left mid lung zone. Mild diffuse peribronchial thickening with improvement. Thoracic and upper lumbar spine degenerative changes. IMPRESSION: 1. No acute abnormality. 2. Mild chronic bronchitic changes. Electronically Signed   By: Claudie Revering M.D.   On: 02/09/2019 09:32   Ct Angio Chest Pe W And/or Wo Contrast  Result Date: 02/26/2019 CLINICAL DATA:  PE suspected, intermediate prob, neg D-dimer. Cough and shortness of breath. Recent VATS lung biopsy 02/09/2019. EXAM: CT ANGIOGRAPHY CHEST WITH CONTRAST TECHNIQUE: Multidetector CT imaging of the chest was performed using the standard protocol during bolus administration of intravenous contrast. Multiplanar CT image reconstructions and MIPs were  obtained to evaluate the vascular anatomy. CONTRAST:  116m OMNIPAQUE IOHEXOL 350 MG/ML SOLN COMPARISON:  Radiographs earlier this day. Chest CT 09/22/2018 FINDINGS: Cardiovascular: Contrast bolus timing and breathing motion artifact limits detailed assessment. No central filling defects in the pulmonary arteries, evaluation is diagnostic to the lobar level. There is opacification of multiple segmental and subsegmental branches, however more detailed assessment is limited. Multi chamber cardiomegaly. Minor aortic atherosclerosis without aneurysm. No aortic dissection. No pericardial effusion. There is contrast refluxing into the hepatic veins and IVC. Mediastinum/Nodes: No enlarged mediastinal, hilar, or axillary lymph nodes. Patulous esophagus without wall thickening. No visualized thyroid nodule. Lungs/Pleura: Ground-glass opacities, fairly diffuse throughout the left lung and involving the right lower lobe. Patchy involvement of the right upper and right middle lobes. Findings are new from prior exam. There is no definite septal thickening. No pneumothorax or confluent airspace disease. No significant pleural fluid. Trachea and mainstem bronchi are patent. Pulmonary nodules on prior exam are obscured by motion. Emphysema. Upper Abdomen: Contrast refluxes into the hepatic veins and IVC. Right renal cyst partially included. Suspected hepatic steatosis. Musculoskeletal: Subcutaneous emphysema throughout the right chest wall which tracks posteriorly and anteriorly chest to the left of midline. This is likely related to recent VATS. Unchanged rounded sclerotic density within T10. degenerative change in the spine. Review of the MIP images confirms the above findings. IMPRESSION: 1. Ground-glass opacities throughout both lungs, diffuse on the left and more patchy in geographic on the right. Findings may represent asymmetric pulmonary edema or infection, including atypical viral organisms. 2. Mild cardiomegaly. Reflux of  contrast into the hepatic veins and IVC consistent with elevated right heart pressures. 3. Limited assessment for pulmonary embolus given contrast bolus timing and patient motion, no large central pulmonary embolism. 4. Subcutaneous emphysema in the right chest wall likely related to recent VATS. No pneumothorax. Aortic Atherosclerosis (ICD10-I70.0). Emphysema (ICD10-J43.9). Electronically Signed   By: MThreasa Beards  [place] M.D.   On: 02/25/2019 17:08   Dg Chest Port 1 View  Result Date: 02/09/2019 CLINICAL DATA:  Cough dyspnea. EXAM: PORTABLE CHEST 1 VIEW COMPARISON:  CT chest 09/22/2018, chest x-ray of February 20, 2019 FINDINGS: Heart size is enlarged as before. Bibasilar opacities have developed since the 02/20/2019 with dense retrocardiac opacification and with more interstitial and subtle alveolar opacities in the right chest. Patient reportedly had a lung biopsy. There is no visible pneumothorax on this semi erect view. Subcutaneous emphysema is noted along the right lateral chest. No acute bone finding. IMPRESSION: 1. Findings that may represent multifocal pneumonia or asymmetric edema. 2. Consolidation or volume loss at the left lung base greater than right. 3. Pulmonary hemorrhage considered on the right given the patient's history of recent right-sided biopsy. 4. No signs of pneumothorax on semi erect view though there is subcutaneous emphysema along the right chest wall. Occult pneumothorax is not excluded. Electronically Signed   By: Zetta Bills M.D.   On: 03/02/2019 11:35   Dg Chest Port 1 View  Result Date: 02/11/2019 CLINICAL DATA:  Chest tube removal EXAM: PORTABLE CHEST 1 VIEW COMPARISON:  February 10, 2019 FINDINGS: Interval removal of right chest tube with new right chest wall subcutaneous emphysema. No definite pneumothorax. Low lung volumes with bibasilar atelectasis. Stable cardiomediastinal contours. IMPRESSION: Post right chest tube removal with new subcutaneous emphysema. No  definite pneumothorax. Electronically Signed   By: Macy Mis M.D.   On: 02/11/2019 08:14   Dg Chest Port 1 View  Result Date: 02/10/2019 CLINICAL DATA:  Patient status post right upper, middle and lower lobe wedge resection 02/09/2019. Chest tube in place. EXAM: PORTABLE CHEST 1 VIEW COMPARISON:  Single-view of the chest 02/09/2019. FINDINGS: Right chest tube remains in place. No pneumothorax. Subsegmental atelectasis in the right lung base has increased. The left lung is clear. Heart size is normal. IMPRESSION: Negative for pneumothorax with a right chest tube in place. Increased subsegmental atelectasis right lung base. Electronically Signed   By: Inge Rise M.D.   On: 02/10/2019 06:43   Dg Chest Port 1 View  Result Date: 02/09/2019 CLINICAL DATA:  Follow-up lung surgery EXAM: PORTABLE CHEST 1 VIEW COMPARISON:  02/09/2019 FINDINGS: Insertion of right-sided chest tube with tip at the apex. No visible pneumothorax. Mild cardiomegaly. Scarring at the left base. Right mid lung platelike atelectasis. No pleural effusion or consolidation. Coarse chronic interstitial opacity. Small amount of right lower chest wall emphysema. IMPRESSION: 1. Insertion of right-sided chest tube with tip at the apex, no visible pneumothorax 2. Mild cardiomegaly. Minimal platelike atelectasis in the right mid lung. Scarring at the left base Electronically Signed   By: Donavan Foil M.D.   On: 02/09/2019 16:15       Phillips Climes M.D on 03/08/2019 at 2:28 PM  Between 7am to 7pm - Pager - [phone]  After 7pm go to [email or web address] - password [facility]  [facility] -  Office  [phone]

## 2019-03-09 LAB — CBC
HCT: 46 % (ref 39.0–52.0)
Hemoglobin: 14.9 g/dL (ref 13.0–17.0)
MCH: 30.6 pg (ref 26.0–34.0)
MCHC: 32.4 g/dL (ref 30.0–36.0)
MCV: 94.5 fL (ref 80.0–100.0)
Platelets: 215 10*3/uL (ref 150–400)
RBC: 4.87 MIL/uL (ref 4.22–5.81)
RDW: 13.4 % (ref 11.5–15.5)
WBC: 11.5 10*3/uL — ABNORMAL HIGH (ref 4.0–10.5)
nRBC: 0 % (ref 0.0–0.2)

## 2019-03-09 LAB — COMPREHENSIVE METABOLIC PANEL
ALT: 39 U/L (ref 0–44)
AST: 22 U/L (ref 15–41)
Albumin: 3.2 g/dL — ABNORMAL LOW (ref 3.5–5.0)
Alkaline Phosphatase: 89 U/L (ref 38–126)
Anion gap: 12 (ref 5–15)
BUN: 60 mg/dL — ABNORMAL HIGH (ref 8–23)
CO2: 29 mmol/L (ref 22–32)
Calcium: 9.2 mg/dL (ref 8.9–10.3)
Chloride: 98 mmol/L (ref 98–111)
Creatinine, Ser: 1.13 mg/dL (ref 0.61–1.24)
GFR calc Af Amer: >60 mL/min (ref >60)
GFR calc non Af Amer: >60 mL/min (ref >60)
Glucose, Bld: 152 mg/dL — ABNORMAL HIGH (ref 70–99)
Potassium: 4.1 mmol/L (ref 3.5–5.1)
Sodium: 139 mmol/L (ref 135–145)
Total Bilirubin: 1.4 mg/dL — ABNORMAL HIGH (ref 0.3–1.2)
Total Protein: 5.9 g/dL — ABNORMAL LOW (ref 6.5–8.1)

## 2019-03-09 LAB — C-REACTIVE PROTEIN: CRP: 0.5 mg/dL (ref <1.0)

## 2019-03-09 LAB — GLUCOSE, CAPILLARY
Glucose-Capillary: 149 mg/dL — ABNORMAL HIGH (ref 70–99)
Glucose-Capillary: 209 mg/dL — ABNORMAL HIGH (ref 70–99)
Glucose-Capillary: 219 mg/dL — ABNORMAL HIGH (ref 70–99)
Glucose-Capillary: 164 mg/dL — ABNORMAL HIGH (ref 70–99)
Glucose-Capillary: 175 mg/dL — ABNORMAL HIGH (ref 70–99)
Glucose-Capillary: 192 mg/dL — ABNORMAL HIGH (ref 70–99)

## 2019-03-09 LAB — D-DIMER, QUANTITATIVE: D-Dimer, Quant: 1.82 ug/mL-FEU — ABNORMAL HIGH (ref 0.00–0.50)

## 2019-03-09 MED ORDER — INSULIN DETEMIR 100 UNIT/ML ~~LOC~~ SOLN
12.0000 [IU] | Freq: Every day | SUBCUTANEOUS | Status: DC
  Administered 2019-03-09 – 2019-03-22 (×14): 12 [IU] via SUBCUTANEOUS
  Filled 2019-03-09 (×16): qty 0.12

## 2019-03-09 MED ORDER — FUROSEMIDE 10 MG/ML IJ SOLN
40.0000 mg | Freq: Once | INTRAMUSCULAR | Status: AC
  Administered 2019-03-09: 09:00:00 40 mg via INTRAVENOUS
  Filled 2019-03-09: qty 4

## 2019-03-09 MED ORDER — INSULIN DETEMIR 100 UNIT/ML ~~LOC~~ SOLN
14.0000 [IU] | Freq: Every day | SUBCUTANEOUS | Status: DC
  Administered 2019-03-09 – 2019-03-25 (×17): 14 [IU] via SUBCUTANEOUS
  Filled 2019-03-09 (×19): qty 0.14

## 2019-03-09 NOTE — Progress Notes (Signed)
Called pt's daughter, [name], to update of pt condition and plan of care. [name] appreciates update.

## 2019-03-09 NOTE — Progress Notes (Signed)
Pt currently speaking with sister. Pt wife and daughter just updated at 50 with progress from the day. All questions answered at that time, will notify with any changes.

## 2019-03-09 NOTE — Progress Notes (Signed)
[facility] PCCM  Feeling better Still has high O2 needs, off heated high flow at this point Appetite picking up Encouraged mobility Continue current management Discussed briefly with Dr. Rejeana Brock, MD [place] PCCM Pager: [phone] Cell: [phone] If no response, call [phone]

## 2019-03-09 NOTE — Progress Notes (Signed)
Pt ambulating from chair back to bed with RN. Desat to 59% on HFNC at 15L. Placed on NRB with additional 15L. Pt recovered to 92% quickly.

## 2019-03-09 NOTE — Progress Notes (Signed)
Patient wife and daughter called and updated on patient transfer. Dicussed level of care, oxygen requirements and patient status. All questions answered at this time.

## 2019-03-09 NOTE — Progress Notes (Signed)
PROGRESS NOTE                                                                                                                                                                                                             Patient Demographics:    Justin Atkinson, is a 78 y.o. male, DOB - 1940/05/13, VOJ:[id number]  Admit date - 02/07/2019   Admitting Physician Eugenie Filler, MD  Outpatient Primary MD for the patient is [name], MD  LOS - 10   No chief complaint on file.      Brief Narrative    78 year old  WM PMHx COPD/pulmonary fibrosis recently diagnosed hypersensitivity pneumonitis (had VATS wedge resection on 11/9,follows with Dr. Vaughan Browner) who was recently started on steroid on 11/18, HTN, diabetes type 2 uncontrolled with complication, HLD, and DJD . -Patient admitted to ICU given profound hypoxia, still requiring heated high flow oxygen together with nonrebreather as well, received Actemra, plasma, treated with remdesivir and steroids, patient remains in ICU, still with significant oxygen requirement, with no improvement.   Subjective:    Justin Atkinson with no significant events overnight, he had a good bowel movement yesterday, is able to get out of bed to chair   Assessment  & Plan :    Principal Problem:   Acute respiratory failure with hypoxia ([place]) Active Problems:   Hypertension   Hypercholesterolemia   Benign prostate hyperplasia   ILD (interstitial lung disease) (HCC)   Postinflammatory pulmonary fibrosis (HCC)   HCAP (healthcare-associated pneumonia)   Chronic obstructive pulmonary disease (HCC)   Pneumonia due to COVID-19 virus   Severe sepsis (HCC)   Essential hypertension   Diabetes mellitus type 2, controlled, with complications ([place])   Pulmonary fibrosis (HCC)   Hypersensitivity pneumonitis (HCC)   Acute hypoxic respiratory failure due to COVID-19 pneumonia -Patient remains with significant hypoxia, improved  oxygen requirement, he remains on NRB, and [place] nasal cannula today as well,, still requiring NRB support with minimal movement. -Patient with underlying hypersensitivity pneumonitis, and history of ILD. -Remains in ICU given high oxygen requirement. -Received convalescent plasma 11/30 -Received remdesivir x5 days -Received Actemra 11/29 -Patient encouraged to use incentive spirometry, flutter valve, and to do modified proning . -renal Function has improved, will give 1 dose of IV Lasix today   COVID-19 Labs  Recent Labs  03/07/19 0250 03/08/19 0442 03/09/19 0502  DDIMER  --  1.95* 1.82*  CRP 0.6 0.5 0.5    Lab Results  Component Value Date   SARSCOV2NAA POSITIVE (A) 02/04/2019   SARSCOV2NAA NOT DETECTED 02/05/2019   [place] NOT DETECTED 01/09/2019   Hypersensivitivy pneumonitis/ Hx of ILD -Following with pulmonary Dr. Vaughan Browner as an outpatient -Diagnosed with recent biopsy - Prednisone 89m daily at discharge until seen in pulmonary clinic per PCCM.  He is on IV Solu-Medrol 30 mg IV daily.  AKI -No function peaked at 1.65 secondary to diuresis, has improved with holding diuresis, will monitor closely as patient back on IV Lasix . -Antihypertensive medication has been stopped as well  Hypertension -Given blood pressure on the lower side, medication has been on hold given irbesartan, hydrochlorothiazide and metoprolol .  Hyperlipidemia -Continue with statin  Acute metabolic encephalopathy/hospital delirium -Patient with significant agitation/confusion, where he was started on Precedex drip, is currently improved, off Precedex drip, on Seroquel nightly and as needed Haldol.  Diabetes mellitus controlled -11/28 hemoglobin A1c= 7.0 -Continue with current regimen, overall CBGs are acceptable .  Code Status : DNR,   Family Communication  : Will update daughter  today  Disposition Plan  : remains in ICU  Consults  :  PCCM  Procedures  : None  DVT Prophylaxis   :  [place] lovenox  Lab Results  Component Value Date   PLT 215 03/09/2019    Antibiotics  :    Anti-infectives (From admission, onward)   Start     Dose/Rate Route Frequency Ordered Stop   03/01/19 0800  remdesivir 100 mg in sodium chloride 0.9 % 250 mL IVPB     100 mg 500 mL/hr over 30 Minutes Intravenous Every 24 hours 02/28/19 0823 03/04/19 1216   02/28/19 1000  remdesivir 200 mg in sodium chloride 0.9 % 250 mL IVPB     200 mg 500 mL/hr over 30 Minutes Intravenous Once 02/28/19 0823 02/28/19 1006   02/28/19 0000  vancomycin (VANCOCIN) 1,250 mg in sodium chloride 0.9 % 250 mL IVPB  Status:  Discontinued     1,250 mg 166.7 mL/hr over 90 Minutes Intravenous Every 12 hours 02/11/2019 1639 02/15/2019 2037   02/06/2019 2200  ceFEPIme (MAXIPIME) 2 g in sodium chloride 0.9 % 100 mL IVPB  Status:  Discontinued     2 g 200 mL/hr over 30 Minutes Intravenous Every 8 hours 03/01/2019 1629 03/03/19 1242   02/10/2019 1315  ceFEPIme (MAXIPIME) 2 g in sodium chloride 0.9 % 100 mL IVPB     2 g 200 mL/hr over 30 Minutes Intravenous  Once 03/01/2019 1306 02/28/19 0324   02/04/2019 1315  vancomycin (VANCOCIN) 2,000 mg in sodium chloride 0.9 % 500 mL IVPB     2,000 mg 250 mL/hr over 120 Minutes Intravenous  Once 02/26/2019 1311 02/28/19 0324        Objective:   Vitals:   03/09/19 0900 03/09/19 1015 03/09/19 1100 03/09/19 1200  BP: (!) 104/35 122/71 122/71 120/74  Pulse: 66 63 66 65  Resp: (!) 23 18 (!) 25 (!) 22  Temp:      TempSrc:      SpO2: 90% 97% 93% 100%  Weight:      Height:        Wt Readings from Last 3 Encounters:  03/06/19 124.2 kg  02/20/19 (!) 137 kg  02/18/19 (!) 137 kg     Intake/Output Summary (Last 24 hours) at 03/09/2019 1210 Last data filed  at 03/09/2019 1105 Gross per 24 hour  Intake 240 ml  Output 1500 ml  Net -1260 ml     Physical Exam  Awake Alert, Oriented X 3, No new F.N deficits, sitting in a recliner Symmetrical Chest wall movement, Good air movement bilaterally,  CTAB RRR,No Gallops,Rubs or new Murmurs, No Parasternal Heave +ve B.Sounds, Abd Soft, No tenderness, No rebound - guarding or rigidity. No Cyanosis, Clubbing or edema, No new Rash or bruise       Data Review:    CBC Recent Labs  Lab 03/03/19 0450 03/04/19 0520 03/05/19 0350 03/06/19 0303 03/08/19 0442 03/09/19 0502  WBC 12.1* 11.3* 11.4* 11.7* 11.6* 11.5*  HGB 14.5 15.6 15.8 15.9 15.6 14.9  HCT 44.9 48.4 48.7 49.8 48.6 46.0  PLT 361 356 323 321 250 215  MCV 94.7 94.2 93.8 94.5 95.3 94.5  MCH 30.6 30.4 30.4 30.2 30.6 30.6  MCHC 32.3 32.2 32.4 31.9 32.1 32.4  RDW 13.3 13.3 13.4 13.4 13.4 13.4  LYMPHSABS 0.5* 0.5* 0.8 0.7  --   --   MONOABS 0.3 0.5 0.4 0.4  --   --   EOSABS 0.0 0.0 0.3 0.6*  --   --   BASOSABS 0.0 0.0 0.0 0.0  --   --     Chemistries  Recent Labs  Lab 03/03/19 0450 03/04/19 0520 03/05/19 0350 03/06/19 0303 03/07/19 0250 03/08/19 0442 03/09/19 0502  NA 142 144 143 142 143 140 139  K 4.7 4.5 3.9 3.5 4.0 4.3 4.1  CL 106 106 103 102 101 98 98  CO2 _0 GLUCOSE 213* 184* 178* 146* 139* 168* 152*  BUN 35* 42* 55* 77* 80* 75* 60*  CREATININE 0.84 0.89 1.12 1.63* 1.58* 1.40* 1.13  CALCIUM 9.1 9.4 9.0 8.7* 9.1 9.3 9.2  MG 2.4 2.6* 2.6* 2.9* 2.9*  --   --   AST 34 _1 ALT 55* 53* 50* 46* 46* 44 39  ALKPHOS 105 115 104 102 96 90 89  BILITOT 1.0 0.9 1.0 1.1 1.0 1.3* 1.4*   ------------------------------------------------------------------------------------------------------------------ No results for input(s): CHOL, HDL, LDLCALC, TRIG, CHOLHDL, LDLDIRECT in the last 72 hours.  Lab Results  Component Value Date   HGBA1C 7.0 (H) 02/28/2019   ------------------------------------------------------------------------------------------------------------------ No results for input(s): TSH, T4TOTAL, T3FREE, THYROIDAB in the last 72 hours.  Invalid input(s):  FREET3 ------------------------------------------------------------------------------------------------------------------ No results for input(s): VITAMINB12, FOLATE, FERRITIN, TIBC, IRON, RETICCTPCT in the last 72 hours.  Coagulation profile No results for input(s): INR, PROTIME in the last 168 hours.  Recent Labs    03/08/19 0442 03/09/19 0502  DDIMER 1.95* 1.82*    Cardiac Enzymes No results for input(s): CKMB, TROPONINI, MYOGLOBIN in the last 168 hours.  Invalid input(s): CK ------------------------------------------------------------------------------------------------------------------    Component Value Date/Time   BNP 116.4 (H) 03/05/2019 0350    Inpatient Medications  Scheduled Meds:  aspirin EC  81 mg Oral Daily   Chlorhexidine Gluconate Cloth  6 each Topical Daily   enoxaparin (LOVENOX) injection  60 mg Subcutaneous Q12H   fluticasone  2 spray Each Nare Daily   guaiFENesin  1,200 mg Oral BID   insulin aspart  0-20 Units Subcutaneous TID WC   insulin aspart  6 Units Subcutaneous TID WC   insulin detemir  12 Units Subcutaneous QHS   insulin detemir  14 Units Subcutaneous Daily   latanoprost  1 drop Both Eyes QHS   loratadine  10 mg  Oral Daily   mouth rinse  15 mL Mouth Rinse BID   methylPREDNISolone (SOLU-MEDROL) injection  30 mg Intravenous Daily   omega-3 acid ethyl esters  1 g Oral Daily   pantoprazole  40 mg Oral Daily   QUEtiapine  50 mg Oral QHS   rosuvastatin  10 mg Oral Daily   timolol  1 drop Left Eye BID   traZODone  25 mg Oral QHS   umeclidinium-vilanterol  1 puff Inhalation Daily   Continuous Infusions:  PRN Meds:.acetaminophen, albuterol, ALPRAZolam, haloperidol lactate, morphine injection, ondansetron (ZOFRAN) IV, polyethylene glycol, sodium chloride  Micro Results Recent Results (from the past 240 hour(s))  SARS CORONAVIRUS 2 (TAT 6-24 HRS) Nasopharyngeal Nasopharyngeal Swab     Status: Abnormal   Collection Time:  02/26/2019  2:00 PM   Specimen: Nasopharyngeal Swab  Result Value Ref Range Status   SARS Coronavirus 2 POSITIVE (A) NEGATIVE Final    Comment: RESULT CALLED TO, READ BACK BY AND VERIFIED WITH: [place] 02/13/2019 MCCORMICK K (NOTE) SARS-CoV-2 target nucleic acids are DETECTED. The SARS-CoV-2 RNA is generally detectable in upper and lower respiratory specimens during the acute phase of infection. Positive results are indicative of the presence of SARS-CoV-2 RNA. Clinical correlation with patient history and other diagnostic information is  necessary to determine patient infection status. Positive results do not rule out bacterial infection or co-infection with other viruses.  The expected result is Negative. Fact Sheet for Patients: SugarRoll.be Fact Sheet for Healthcare Providers: https://www.woods-mathews.com/ This test is not yet approved or cleared by the [place] FDA and  has been authorized for detection and/or diagnosis of SARS-CoV-2 by FDA under an Emergency Use Authorization (EUA). This EUA will remain  in effect (meaning this test can be used) for  the duration of the COVID-19 declaration under Section 564(b)(1) of the Act, 21 U.S.C. section 360bbb-3(b)(1), unless the authorization is terminated or revoked sooner. Performed at [facility], [place] [address] [place], [place] [address]   Culture, sputum-assessment     Status: None   Collection Time: 02/13/2019  4:04 PM   Specimen: Sputum  Result Value Ref Range Status   Specimen Description SPUTUM  Final   Special Requests NONE  Final   Sputum evaluation   Final    Sputum specimen not acceptable for testing.  Please recollect.   RESULT CALLED TO, READ BACK BY AND VERIFIED WITH: I [facility] 02/28/19 2116 RHOLMES Performed at [facility], [place] [address], [place], [place] [address]    Report Status 02/28/2019 FINAL  Final  Respiratory Panel by PCR      Status: None   Collection Time: 02/14/2019  6:45 PM   Specimen: Flu Kit Nasopharyngeal Swab; Respiratory  Result Value Ref Range Status   Adenovirus NOT DETECTED NOT DETECTED Final   Coronavirus 229E NOT DETECTED NOT DETECTED Final    Comment: (NOTE) The Coronavirus on the Respiratory Panel, DOES NOT test for the novel  Coronavirus (2019 nCoV)    Coronavirus HKU1 NOT DETECTED NOT DETECTED Final   Coronavirus NL63 NOT DETECTED NOT DETECTED Final   Coronavirus OC43 NOT DETECTED NOT DETECTED Final   Metapneumovirus NOT DETECTED NOT DETECTED Final   Rhinovirus / Enterovirus NOT DETECTED NOT DETECTED Final   Influenza A NOT DETECTED NOT DETECTED Final   Influenza B NOT DETECTED NOT DETECTED Final   Parainfluenza Virus 1 NOT DETECTED NOT DETECTED Final   Parainfluenza Virus 2 NOT DETECTED NOT DETECTED Final   Parainfluenza  Virus 3 NOT DETECTED NOT DETECTED Final   Parainfluenza Virus 4 NOT DETECTED NOT DETECTED Final   Respiratory Syncytial Virus NOT DETECTED NOT DETECTED Final   Bordetella pertussis NOT DETECTED NOT DETECTED Final   Chlamydophila pneumoniae NOT DETECTED NOT DETECTED Final   Mycoplasma pneumoniae NOT DETECTED NOT DETECTED Final    Comment: Performed at [facility], [place] [address] [place], [place] [address]  MRSA PCR Screening     Status: None   Collection Time: 02/25/2019  6:45 PM   Specimen: Nasal Mucosa; Nasopharyngeal  Result Value Ref Range Status   MRSA by PCR NEGATIVE NEGATIVE Final    Comment:        The GeneXpert MRSA Assay (FDA approved for NASAL specimens only), is one component of a comprehensive MRSA colonization surveillance program. It is not intended to diagnose MRSA infection nor to guide or monitor treatment for MRSA infections. Performed at [facility], [place] [address], [place], [place] [address]   Urine culture     Status: None   Collection Time: 02/22/2019  7:15 PM   Specimen: Urine, Random  Result Value Ref Range  Status   Specimen Description   Final    URINE, RANDOM Performed at [place] [address], [place], [place] [address]    Special Requests   Final    NONE Performed at [facility], [place] [address], [place], [place] [address]    Culture   Final    NO GROWTH Performed at [facility], [place] [address], [place], [place] [address]    Report Status 03/01/2019 FINAL  Final    Radiology Reports Dg Chest 2 View  Result Date: 02/20/2019 CLINICAL DATA:  Interstitial lung disease, COPD, diabetes mellitus, hypertension EXAM: CHEST - 2 VIEW COMPARISON:  02/11/2019, 02/09/2019 FINDINGS: Enlargement of cardiac silhouette. Mediastinal contours and pulmonary vascularity normal. Mild accentuation of interstitial markings in the periphery of both lungs. No acute infiltrate, pleural effusion or pneumothorax. Osseous structures unremarkable. IMPRESSION: Peripheral interstitial prominence in the lungs bilaterally, slightly greater at bases. No definite acute infiltrate. Mild enlargement of cardiac silhouette. Electronically Signed   By: Lavonia Dana M.D.   On: 02/20/2019 13:08   Dg Chest 2 View  Result Date: 02/09/2019 CLINICAL DATA:  Pre right lung biopsy evaluation. Pulmonary fibrosis. Ex-smoker. EXAM: CHEST - 2 VIEW COMPARISON:  Chest CT dated 09/22/2018. Chest radiographs dated 07/25/2012 FINDINGS: The cardiac silhouette is borderline enlarged. The aorta is tortuous. Stable minimal linear scarring at the left lung base with interval minimal linear scarring in the left mid lung zone. Mild diffuse peribronchial thickening with improvement. Thoracic and upper lumbar spine degenerative changes. IMPRESSION: 1. No acute abnormality. 2. Mild chronic bronchitic changes. Electronically Signed   By: Claudie Revering M.D.   On: 02/09/2019 09:32   Ct Angio Chest Pe W And/or Wo Contrast  Result Date: 02/20/2019 CLINICAL DATA:  PE suspected, intermediate prob, neg D-dimer.  Cough and shortness of breath. Recent VATS lung biopsy 02/09/2019. EXAM: CT ANGIOGRAPHY CHEST WITH CONTRAST TECHNIQUE: Multidetector CT imaging of the chest was performed using the standard protocol during bolus administration of intravenous contrast. Multiplanar CT image reconstructions and MIPs were obtained to evaluate the vascular anatomy. CONTRAST:  171m OMNIPAQUE IOHEXOL 350 MG/ML SOLN COMPARISON:  Radiographs earlier this day. Chest CT 09/22/2018 FINDINGS: Cardiovascular: Contrast bolus timing and breathing motion artifact limits detailed assessment. No central filling defects in the pulmonary arteries, evaluation is diagnostic to  the lobar level. There is opacification of multiple segmental and subsegmental branches, however more detailed assessment is limited. Multi chamber cardiomegaly. Minor aortic atherosclerosis without aneurysm. No aortic dissection. No pericardial effusion. There is contrast refluxing into the hepatic veins and IVC. Mediastinum/Nodes: No enlarged mediastinal, hilar, or axillary lymph nodes. Patulous esophagus without wall thickening. No visualized thyroid nodule. Lungs/Pleura: Ground-glass opacities, fairly diffuse throughout the left lung and involving the right lower lobe. Patchy involvement of the right upper and right middle lobes. Findings are new from prior exam. There is no definite septal thickening. No pneumothorax or confluent airspace disease. No significant pleural fluid. Trachea and mainstem bronchi are patent. Pulmonary nodules on prior exam are obscured by motion. Emphysema. Upper Abdomen: Contrast refluxes into the hepatic veins and IVC. Right renal cyst partially included. Suspected hepatic steatosis. Musculoskeletal: Subcutaneous emphysema throughout the right chest wall which tracks posteriorly and anteriorly chest to the left of midline. This is likely related to recent VATS. Unchanged rounded sclerotic density within T10. degenerative change in the spine. Review  of the MIP images confirms the above findings. IMPRESSION: 1. Ground-glass opacities throughout both lungs, diffuse on the left and more patchy in geographic on the right. Findings may represent asymmetric pulmonary edema or infection, including atypical viral organisms. 2. Mild cardiomegaly. Reflux of contrast into the hepatic veins and IVC consistent with elevated right heart pressures. 3. Limited assessment for pulmonary embolus given contrast bolus timing and patient motion, no large central pulmonary embolism. 4. Subcutaneous emphysema in the right chest wall likely related to recent VATS. No pneumothorax. Aortic Atherosclerosis (ICD10-I70.0). Emphysema (ICD10-J43.9). Electronically Signed   By: Keith Rake M.D.   On: 02/19/2019 17:08   Dg Chest Port 1 View  Result Date: 02/14/2019 CLINICAL DATA:  Cough dyspnea. EXAM: PORTABLE CHEST 1 VIEW COMPARISON:  CT chest 09/22/2018, chest x-ray of February 20, 2019 FINDINGS: Heart size is enlarged as before. Bibasilar opacities have developed since the 02/20/2019 with dense retrocardiac opacification and with more interstitial and subtle alveolar opacities in the right chest. Patient reportedly had a lung biopsy. There is no visible pneumothorax on this semi erect view. Subcutaneous emphysema is noted along the right lateral chest. No acute bone finding. IMPRESSION: 1. Findings that may represent multifocal pneumonia or asymmetric edema. 2. Consolidation or volume loss at the left lung base greater than right. 3. Pulmonary hemorrhage considered on the right given the patient's history of recent right-sided biopsy. 4. No signs of pneumothorax on semi erect view though there is subcutaneous emphysema along the right chest wall. Occult pneumothorax is not excluded. Electronically Signed   By: Zetta Bills M.D.   On: 02/07/2019 11:35   Dg Chest Port 1 View  Result Date: 02/11/2019 CLINICAL DATA:  Chest tube removal EXAM: PORTABLE CHEST 1 VIEW COMPARISON:   February 10, 2019 FINDINGS: Interval removal of right chest tube with new right chest wall subcutaneous emphysema. No definite pneumothorax. Low lung volumes with bibasilar atelectasis. Stable cardiomediastinal contours. IMPRESSION: Post right chest tube removal with new subcutaneous emphysema. No definite pneumothorax. Electronically Signed   By: Macy Mis M.D.   On: 02/11/2019 08:14   Dg Chest Port 1 View  Result Date: 02/10/2019 CLINICAL DATA:  Patient status post right upper, middle and lower lobe wedge resection 02/09/2019. Chest tube in place. EXAM: PORTABLE CHEST 1 VIEW COMPARISON:  Single-view of the chest 02/09/2019. FINDINGS: Right chest tube remains in place. No pneumothorax. Subsegmental atelectasis in the right lung base has increased. The left  lung is clear. Heart size is normal. IMPRESSION: Negative for pneumothorax with a right chest tube in place. Increased subsegmental atelectasis right lung base. Electronically Signed   By: Inge Rise M.D.   On: 02/10/2019 06:43   Dg Chest Port 1 View  Result Date: 02/09/2019 CLINICAL DATA:  Follow-up lung surgery EXAM: PORTABLE CHEST 1 VIEW COMPARISON:  02/09/2019 FINDINGS: Insertion of right-sided chest tube with tip at the apex. No visible pneumothorax. Mild cardiomegaly. Scarring at the left base. Right mid lung platelike atelectasis. No pleural effusion or consolidation. Coarse chronic interstitial opacity. Small amount of right lower chest wall emphysema. IMPRESSION: 1. Insertion of right-sided chest tube with tip at the apex, no visible pneumothorax 2. Mild cardiomegaly. Minimal platelike atelectasis in the right mid lung. Scarring at the left base Electronically Signed   By: Donavan Foil M.D.   On: 02/09/2019 16:15       Phillips Climes M.D on 03/09/2019 at 12:10 PM  Between 7am to 7pm - Pager - [phone]  After 7pm go to [email or web address] - password [facility]  [facility] -  Office  [phone]

## 2019-03-09 NOTE — Progress Notes (Signed)
Pt transported to 167 via wheelchair accompanied by RN on a portable monitor and oxygen via non-rebreather mask. Transferred to chair. Tolerated well. Meghan RN at bedside to receive pt. No s/s of distress at this time.

## 2019-03-09 NOTE — Progress Notes (Signed)
Pt spoke with family members via facetime and updated them of current condition.

## 2019-03-10 LAB — GLUCOSE, CAPILLARY
Glucose-Capillary: 116 mg/dL — ABNORMAL HIGH (ref 70–99)
Glucose-Capillary: 233 mg/dL — ABNORMAL HIGH (ref 70–99)

## 2019-03-10 LAB — COMPREHENSIVE METABOLIC PANEL
ALT: 41 U/L (ref 0–44)
AST: 22 U/L (ref 15–41)
Albumin: 3.3 g/dL — ABNORMAL LOW (ref 3.5–5.0)
Alkaline Phosphatase: 83 U/L (ref 38–126)
Anion gap: 10 (ref 5–15)
BUN: 56 mg/dL — ABNORMAL HIGH (ref 8–23)
CO2: 31 mmol/L (ref 22–32)
Calcium: 9.3 mg/dL (ref 8.9–10.3)
Chloride: 98 mmol/L (ref 98–111)
Creatinine, Ser: 1.12 mg/dL (ref 0.61–1.24)
GFR calc Af Amer: >60 mL/min (ref >60)
GFR calc non Af Amer: >60 mL/min (ref >60)
Glucose, Bld: 102 mg/dL — ABNORMAL HIGH (ref 70–99)
Potassium: 4.1 mmol/L (ref 3.5–5.1)
Sodium: 139 mmol/L (ref 135–145)
Total Bilirubin: 1.1 mg/dL (ref 0.3–1.2)
Total Protein: 6.1 g/dL — ABNORMAL LOW (ref 6.5–8.1)

## 2019-03-10 LAB — C-REACTIVE PROTEIN: CRP: <0.5 mg/dL (ref <1.0)

## 2019-03-10 LAB — CBC
HCT: 46.3 % (ref 39.0–52.0)
Hemoglobin: 14.8 g/dL (ref 13.0–17.0)
MCH: 30.2 pg (ref 26.0–34.0)
MCHC: 32 g/dL (ref 30.0–36.0)
MCV: 94.5 fL (ref 80.0–100.0)
Platelets: 186 10*3/uL (ref 150–400)
RBC: 4.9 MIL/uL (ref 4.22–5.81)
RDW: 13.5 % (ref 11.5–15.5)
WBC: 10.4 10*3/uL (ref 4.0–10.5)
nRBC: 0 % (ref 0.0–0.2)

## 2019-03-10 LAB — D-DIMER, QUANTITATIVE: D-Dimer, Quant: 1.67 ug/mL-FEU — ABNORMAL HIGH (ref 0.00–0.50)

## 2019-03-10 MED ORDER — FUROSEMIDE 10 MG/ML IJ SOLN
60.0000 mg | Freq: Once | INTRAMUSCULAR | Status: AC
  Administered 2019-03-10: 60 mg via INTRAVENOUS
  Filled 2019-03-10: qty 6

## 2019-03-10 NOTE — Progress Notes (Signed)
PROGRESS NOTE                                                                                                                                                                                                             Patient Demographics:    Justin Atkinson, is a 78 y.o. male, DOB - 1940/05/15, [id number]  Admit date - 02/11/2019   Admitting Physician Justin Filler, MD  Outpatient Primary MD for the patient is Justin Infante, MD  LOS - 11   No chief complaint on file.      Brief Narrative    78 year old  WM PMHx COPD/pulmonary fibrosis recently diagnosed hypersensitivity pneumonitis (had VATS wedge resection on 11/9,follows with Dr. Vaughan Atkinson) who was recently started on steroid on 11/18, HTN, diabetes type 2 uncontrolled with complication, HLD, and DJD . -Patient admitted to ICU given profound hypoxia, still requiring heated high flow oxygen together with nonrebreather as well, received Actemra, plasma, treated with remdesivir and steroids, patient remains in ICU, still with significant oxygen requirement, with no improvement.   Subjective:    Justin Atkinson with no significant events overnight, he denies any complaints today  Assessment  & Plan :    Principal Problem:   Acute respiratory failure with hypoxia (HCC) Active Problems:   Hypertension   Hypercholesterolemia   Benign prostate hyperplasia   ILD (interstitial lung disease) (HCC)   Postinflammatory pulmonary fibrosis (HCC)   HCAP (healthcare-associated pneumonia)   Chronic obstructive pulmonary disease (HCC)   Pneumonia due to COVID-19 virus   Severe sepsis (HCC)   Essential hypertension   Diabetes mellitus type 2, controlled, with complications ([place])   Pulmonary fibrosis (HCC)   Hypersensitivity pneumonitis ([place])   Acute hypoxic respiratory failure due to COVID-19 pneumonia -Patient remains with significant hypoxia, improved oxygen requirement, he remains on NRB, and [place]  nasal cannula today as well(not on heated high flow anymore). -Patient with underlying hypersensitivity pneumonitis, and history of ILD. -Remains in ICU given high oxygen requirement. -Received convalescent plasma 11/30 -Received remdesivir x5 days -Received Actemra 11/29 -Patient encouraged to use incentive spirometry, flutter valve, and to do modified proning . -Diuresis as needed, renal function is stable, will give 60 mg of IV Lasix once.   COVID-19 Labs  Recent Labs    03/08/19 0442 03/09/19 0502 03/10/19 0310  DDIMER 1.95* 1.82* 1.67*  CRP 0.5 0.5 <0.5    Lab Results  Component Value Date   SARSCOV2NAA POSITIVE (A) 03/02/2019   SARSCOV2NAA NOT DETECTED 02/05/2019   [place] NOT DETECTED 01/09/2019   Hypersensivitivy pneumonitis/ Hx of ILD -Following with pulmonary Dr. Vaughan Atkinson as an outpatient -Diagnosed with recent biopsy - Prednisone 20mg  daily at discharge until seen in pulmonary clinic per PCCM.  He is on IV Solu-Medrol 30 mg IV daily.  AKI -renal function peaked at 1.65 secondary to diuresis, has improved with holding diuresis. -Antihypertensive medication has been stopped as well  Hypertension -Given blood pressure on the lower side, medication has been on hold given irbesartan, hydrochlorothiazide and metoprolol .  Hyperlipidemia -Continue with statin  Acute metabolic encephalopathy/hospital delirium -Patient with significant agitation/confusion, where he was started on Precedex drip, is currently improved, off Precedex drip, on Seroquel nightly and as needed Haldol.  Diabetes mellitus controlled -11/28 hemoglobin A1c= 7.0 -Continue with current regimen, overall CBGs are acceptable .  Code Status : DNR,   Family Communication  : Discussed with patient  Disposition Plan  : remains in ICU  Consults  :  PCCM  Procedures  : None  DVT Prophylaxis  :  [place] lovenox  Lab Results  Component Value Date   PLT 186 03/10/2019    Antibiotics  :     Anti-infectives (From admission, onward)   Start     Dose/Rate Route Frequency Ordered Stop   03/01/19 0800  remdesivir 100 mg in sodium chloride 0.9 % 250 mL IVPB     100 mg 500 mL/hr over 30 Minutes Intravenous Every 24 hours 02/28/19 0823 03/04/19 1216   02/28/19 1000  remdesivir 200 mg in sodium chloride 0.9 % 250 mL IVPB     200 mg 500 mL/hr over 30 Minutes Intravenous Once 02/28/19 0823 02/28/19 1006   02/28/19 0000  vancomycin (VANCOCIN) 1,250 mg in sodium chloride 0.9 % 250 mL IVPB  Status:  Discontinued     1,250 mg 166.7 mL/hr over 90 Minutes Intravenous Every 12 hours 02/18/2019 1639 02/26/2019 2037   02/23/2019 2200  ceFEPIme (MAXIPIME) 2 g in sodium chloride 0.9 % 100 mL IVPB  Status:  Discontinued     2 g 200 mL/hr over 30 Minutes Intravenous Every 8 hours 02/11/2019 1629 03/03/19 1242   02/26/2019 1315  ceFEPIme (MAXIPIME) 2 g in sodium chloride 0.9 % 100 mL IVPB     2 g 200 mL/hr over 30 Minutes Intravenous  Once 02/05/2019 1306 02/28/19 0324   02/13/2019 1315  vancomycin (VANCOCIN) 2,000 mg in sodium chloride 0.9 % 500 mL IVPB     2,000 mg 250 mL/hr over 120 Minutes Intravenous  Once 02/12/2019 1311 02/28/19 0324        Objective:   Vitals:   03/10/19 1620 03/10/19 1630 03/10/19 1654 03/10/19 1659  BP: 132/69     Pulse: 69 66 66 69  Resp: (!) 25 20 (!) 23 (!) 24  Temp: (!) 97.3 F (36.3 C)     TempSrc: Axillary     SpO2: (!) 88% 93% 97% (!) 84%  Weight:      Height:        Wt Readings from Last 3 Encounters:  03/06/19 124.2 kg  02/20/19 (!) 137 kg  02/18/19 (!) 137 kg     Intake/Output Summary (Last 24 hours) at 03/10/2019 1747 Last data filed at 03/10/2019 1700 Gross per 24 hour  Intake 1200 ml  Output 1200 ml  Net 0 ml  Physical Exam  Awake Alert, Oriented X 3, No new F.N deficits, Normal affect Symmetrical Chest wall movement, Good air movement bilaterally, CTAB RRR,No Gallops,Rubs or new Murmurs, No Parasternal Heave +ve B.Sounds, Abd Soft, No  tenderness, No rebound - guarding or rigidity. No Cyanosis, Clubbing or edema, No new Rash or bruise         Data Review:    CBC Recent Labs  Lab 03/04/19 0520 03/05/19 0350 03/06/19 0303 03/08/19 0442 03/09/19 0502 03/10/19 0310  WBC 11.3* 11.4* 11.7* 11.6* 11.5* 10.4  HGB 15.6 15.8 15.9 15.6 14.9 14.8  HCT 48.4 48.7 49.8 48.6 46.0 46.3  PLT 356 323 321 250 215 186  MCV 94.2 93.8 94.5 95.3 94.5 94.5  MCH 30.4 30.4 30.2 30.6 30.6 30.2  MCHC 32.2 32.4 31.9 32.1 32.4 32.0  RDW 13.3 13.4 13.4 13.4 13.4 13.5  LYMPHSABS 0.5* 0.8 0.7  --   --   --   MONOABS 0.5 0.4 0.4  --   --   --   EOSABS 0.0 0.3 0.6*  --   --   --   BASOSABS 0.0 0.0 0.0  --   --   --     Chemistries  Recent Labs  Lab 03/04/19 0520 03/05/19 0350 03/06/19 0303 03/07/19 0250 03/08/19 0442 03/09/19 0502 03/10/19 0310  NA 144 143 142 143 140 139 139  K 4.5 3.9 3.5 4.0 4.3 4.1 4.1  CL 106 103 102 101 98 98 98  CO2 25 26 28 29 31 29 31   GLUCOSE 184* 178* 146* 139* 168* 152* 102*  BUN 42* 55* 77* 80* 75* 60* 56*  CREATININE 0.89 1.12 1.63* 1.58* 1.40* 1.13 1.12  CALCIUM 9.4 9.0 8.7* 9.1 9.3 9.2 9.3  MG 2.6* 2.6* 2.9* 2.9*  --   --   --   AST 25 28 22 23 22 22 22   ALT 53* 50* 46* 46* 44 39 41  ALKPHOS 115 104 102 96 90 89 83  BILITOT 0.9 1.0 1.1 1.0 1.3* 1.4* 1.1   ------------------------------------------------------------------------------------------------------------------ No results for input(s): CHOL, HDL, LDLCALC, TRIG, CHOLHDL, LDLDIRECT in the last 72 hours.  Lab Results  Component Value Date   HGBA1C 7.0 (H) 02/28/2019   ------------------------------------------------------------------------------------------------------------------ No results for input(s): TSH, T4TOTAL, T3FREE, THYROIDAB in the last 72 hours.  Invalid input(s): FREET3 ------------------------------------------------------------------------------------------------------------------ No results for input(s):  VITAMINB12, FOLATE, FERRITIN, TIBC, IRON, RETICCTPCT in the last 72 hours.  Coagulation profile No results for input(s): INR, PROTIME in the last 168 hours.  Recent Labs    03/09/19 0502 03/10/19 0310  DDIMER 1.82* 1.67*    Cardiac Enzymes No results for input(s): CKMB, TROPONINI, MYOGLOBIN in the last 168 hours.  Invalid input(s): CK ------------------------------------------------------------------------------------------------------------------    Component Value Date/Time   BNP 116.4 (H) 03/05/2019 0350    Inpatient Medications  Scheduled Meds:  aspirin EC  81 mg Oral Daily   Chlorhexidine Gluconate Cloth  6 each Topical Daily   enoxaparin (LOVENOX) injection  60 mg Subcutaneous Q12H   fluticasone  2 spray Each Nare Daily   guaiFENesin  1,200 mg Oral BID   insulin aspart  0-20 Units Subcutaneous TID WC   insulin aspart  6 Units Subcutaneous TID WC   insulin detemir  12 Units Subcutaneous QHS   insulin detemir  14 Units Subcutaneous Daily   latanoprost  1 drop Both Eyes QHS   loratadine  10 mg Oral Daily   mouth rinse  15 mL Mouth Rinse BID  methylPREDNISolone (SOLU-MEDROL) injection  30 mg Intravenous Daily   omega-3 acid ethyl esters  1 g Oral Daily   pantoprazole  40 mg Oral Daily   QUEtiapine  50 mg Oral QHS   rosuvastatin  10 mg Oral Daily   timolol  1 drop Left Eye BID   traZODone  25 mg Oral QHS   umeclidinium-vilanterol  1 puff Inhalation Daily   Continuous Infusions:  PRN Meds:.acetaminophen, albuterol, ALPRAZolam, haloperidol lactate, morphine injection, ondansetron (ZOFRAN) IV, polyethylene glycol, sodium chloride  Micro Results No results found for this or any previous visit (from the past 240 hour(s)).  Radiology Reports Dg Chest 2 View  Result Date: 02/20/2019 CLINICAL DATA:  Interstitial lung disease, COPD, diabetes mellitus, hypertension EXAM: CHEST - 2 VIEW COMPARISON:  02/11/2019, 02/09/2019 FINDINGS: Enlargement of  cardiac silhouette. Mediastinal contours and pulmonary vascularity normal. Mild accentuation of interstitial markings in the periphery of both lungs. No acute infiltrate, pleural effusion or pneumothorax. Osseous structures unremarkable. IMPRESSION: Peripheral interstitial prominence in the lungs bilaterally, slightly greater at bases. No definite acute infiltrate. Mild enlargement of cardiac silhouette. Electronically Signed   By: Lavonia Dana M.D.   On: 02/20/2019 13:08   Dg Chest 2 View  Result Date: 02/09/2019 CLINICAL DATA:  Pre right lung biopsy evaluation. Pulmonary fibrosis. Ex-smoker. EXAM: CHEST - 2 VIEW COMPARISON:  Chest CT dated 09/22/2018. Chest radiographs dated 07/25/2012 FINDINGS: The cardiac silhouette is borderline enlarged. The aorta is tortuous. Stable minimal linear scarring at the left lung base with interval minimal linear scarring in the left mid lung zone. Mild diffuse peribronchial thickening with improvement. Thoracic and upper lumbar spine degenerative changes. IMPRESSION: 1. No acute abnormality. 2. Mild chronic bronchitic changes. Electronically Signed   By: Claudie Revering M.D.   On: 02/09/2019 09:32   Ct Angio Chest Pe W And/or Wo Contrast  Result Date: 02/02/2019 CLINICAL DATA:  PE suspected, intermediate prob, neg D-dimer. Cough and shortness of breath. Recent VATS lung biopsy 02/09/2019. EXAM: CT ANGIOGRAPHY CHEST WITH CONTRAST TECHNIQUE: Multidetector CT imaging of the chest was performed using the standard protocol during bolus administration of intravenous contrast. Multiplanar CT image reconstructions and MIPs were obtained to evaluate the vascular anatomy. CONTRAST:  168mL OMNIPAQUE IOHEXOL 350 MG/ML SOLN COMPARISON:  Radiographs earlier this day. Chest CT 09/22/2018 FINDINGS: Cardiovascular: Contrast bolus timing and breathing motion artifact limits detailed assessment. No central filling defects in the pulmonary arteries, evaluation is diagnostic to the lobar level.  There is opacification of multiple segmental and subsegmental branches, however more detailed assessment is limited. Multi chamber cardiomegaly. Minor aortic atherosclerosis without aneurysm. No aortic dissection. No pericardial effusion. There is contrast refluxing into the hepatic veins and IVC. Mediastinum/Nodes: No enlarged mediastinal, hilar, or axillary lymph nodes. Patulous esophagus without wall thickening. No visualized thyroid nodule. Lungs/Pleura: Ground-glass opacities, fairly diffuse throughout the left lung and involving the right lower lobe. Patchy involvement of the right upper and right middle lobes. Findings are new from prior exam. There is no definite septal thickening. No pneumothorax or confluent airspace disease. No significant pleural fluid. Trachea and mainstem bronchi are patent. Pulmonary nodules on prior exam are obscured by motion. Emphysema. Upper Abdomen: Contrast refluxes into the hepatic veins and IVC. Right renal cyst partially included. Suspected hepatic steatosis. Musculoskeletal: Subcutaneous emphysema throughout the right chest wall which tracks posteriorly and anteriorly chest to the left of midline. This is likely related to recent VATS. Unchanged rounded sclerotic density within T10. degenerative change in the spine. Review of  the MIP images confirms the above findings. IMPRESSION: 1. Ground-glass opacities throughout both lungs, diffuse on the left and more patchy in geographic on the right. Findings may represent asymmetric pulmonary edema or infection, including atypical viral organisms. 2. Mild cardiomegaly. Reflux of contrast into the hepatic veins and IVC consistent with elevated right heart pressures. 3. Limited assessment for pulmonary embolus given contrast bolus timing and patient motion, no large central pulmonary embolism. 4. Subcutaneous emphysema in the right chest wall likely related to recent VATS. No pneumothorax. Aortic Atherosclerosis (ICD10-I70.0).  Emphysema (ICD10-J43.9). Electronically Signed   By: Keith Rake M.D.   On: 02/26/2019 17:08   Dg Chest Port 1 View  Result Date: 02/10/2019 CLINICAL DATA:  Cough dyspnea. EXAM: PORTABLE CHEST 1 VIEW COMPARISON:  CT chest 09/22/2018, chest x-ray of February 20, 2019 FINDINGS: Heart size is enlarged as before. Bibasilar opacities have developed since the 02/20/2019 with dense retrocardiac opacification and with more interstitial and subtle alveolar opacities in the right chest. Patient reportedly had a lung biopsy. There is no visible pneumothorax on this semi erect view. Subcutaneous emphysema is noted along the right lateral chest. No acute bone finding. IMPRESSION: 1. Findings that may represent multifocal pneumonia or asymmetric edema. 2. Consolidation or volume loss at the left lung base greater than right. 3. Pulmonary hemorrhage considered on the right given the patient's history of recent right-sided biopsy. 4. No signs of pneumothorax on semi erect view though there is subcutaneous emphysema along the right chest wall. Occult pneumothorax is not excluded. Electronically Signed   By: Zetta Bills M.D.   On: 02/18/2019 11:35   Dg Chest Port 1 View  Result Date: 02/11/2019 CLINICAL DATA:  Chest tube removal EXAM: PORTABLE CHEST 1 VIEW COMPARISON:  February 10, 2019 FINDINGS: Interval removal of right chest tube with new right chest wall subcutaneous emphysema. No definite pneumothorax. Low lung volumes with bibasilar atelectasis. Stable cardiomediastinal contours. IMPRESSION: Post right chest tube removal with new subcutaneous emphysema. No definite pneumothorax. Electronically Signed   By: Macy Mis M.D.   On: 02/11/2019 08:14   Dg Chest Port 1 View  Result Date: 02/10/2019 CLINICAL DATA:  Patient status post right upper, middle and lower lobe wedge resection 02/09/2019. Chest tube in place. EXAM: PORTABLE CHEST 1 VIEW COMPARISON:  Single-view of the chest 02/09/2019. FINDINGS: Right  chest tube remains in place. No pneumothorax. Subsegmental atelectasis in the right lung base has increased. The left lung is clear. Heart size is normal. IMPRESSION: Negative for pneumothorax with a right chest tube in place. Increased subsegmental atelectasis right lung base. Electronically Signed   By: Inge Rise M.D.   On: 02/10/2019 06:43   Dg Chest Port 1 View  Result Date: 02/09/2019 CLINICAL DATA:  Follow-up lung surgery EXAM: PORTABLE CHEST 1 VIEW COMPARISON:  02/09/2019 FINDINGS: Insertion of right-sided chest tube with tip at the apex. No visible pneumothorax. Mild cardiomegaly. Scarring at the left base. Right mid lung platelike atelectasis. No pleural effusion or consolidation. Coarse chronic interstitial opacity. Small amount of right lower chest wall emphysema. IMPRESSION: 1. Insertion of right-sided chest tube with tip at the apex, no visible pneumothorax 2. Mild cardiomegaly. Minimal platelike atelectasis in the right mid lung. Scarring at the left base Electronically Signed   By: Donavan Foil M.D.   On: 02/09/2019 16:15       Phillips Climes M.D on 03/10/2019 at 5:47 PM  Between 7am to 7pm - Pager - [phone]  After 7pm go to [email or web address] -  password [facility]  [facility] -  Office  [phone]

## 2019-03-10 NOTE — Plan of Care (Signed)
Teaching reinforced with patient, all questions answered. Will continue to reinforce with family via phone. Discharge planning ongoing, not ready for d/c at this time. VSS at this time. Remains on HFNC@15L  +NRB. Afebrile. Weaning O2 as tolerated. Encouraging OOB to chair as tolerated. Tolerating POs well with adequate appetite. Emotional support provided to patient as needed. +BS/ last BM 12/4 per documentation, per patient 12/6. PRN miralax offered to patient this AM, refused. Vdg in urinal adequate amt. No c/o pain at this time. Safe environment of care maintained. Skin intact. Encourage T&R. Will continue to monitor.

## 2019-03-10 NOTE — Progress Notes (Signed)
Called and spoke with [name] and patient's daughter to provide an update on patient's status. All questions answered at this time.

## 2019-03-11 LAB — CBC
HCT: 48.3 % (ref 39.0–52.0)
Hemoglobin: 15.7 g/dL (ref 13.0–17.0)
MCH: 30.8 pg (ref 26.0–34.0)
MCHC: 32.5 g/dL (ref 30.0–36.0)
MCV: 94.7 fL (ref 80.0–100.0)
Platelets: 158 10*3/uL (ref 150–400)
RBC: 5.1 MIL/uL (ref 4.22–5.81)
RDW: 13.4 % (ref 11.5–15.5)
WBC: 11.3 10*3/uL — ABNORMAL HIGH (ref 4.0–10.5)
nRBC: 0 % (ref 0.0–0.2)

## 2019-03-11 LAB — D-DIMER, QUANTITATIVE: D-Dimer, Quant: 1.66 ug/mL-FEU — ABNORMAL HIGH (ref 0.00–0.50)

## 2019-03-11 LAB — COMPREHENSIVE METABOLIC PANEL
ALT: 49 U/L — ABNORMAL HIGH (ref 0–44)
AST: 24 U/L (ref 15–41)
Albumin: 3.5 g/dL (ref 3.5–5.0)
Alkaline Phosphatase: 86 U/L (ref 38–126)
Anion gap: 9 (ref 5–15)
BUN: 49 mg/dL — ABNORMAL HIGH (ref 8–23)
CO2: 33 mmol/L — ABNORMAL HIGH (ref 22–32)
Calcium: 9.2 mg/dL (ref 8.9–10.3)
Chloride: 95 mmol/L — ABNORMAL LOW (ref 98–111)
Creatinine, Ser: 1.09 mg/dL (ref 0.61–1.24)
GFR calc Af Amer: >60 mL/min (ref >60)
GFR calc non Af Amer: >60 mL/min (ref >60)
Glucose, Bld: 113 mg/dL — ABNORMAL HIGH (ref 70–99)
Potassium: 4.4 mmol/L (ref 3.5–5.1)
Sodium: 137 mmol/L (ref 135–145)
Total Bilirubin: 1 mg/dL (ref 0.3–1.2)
Total Protein: 6.3 g/dL — ABNORMAL LOW (ref 6.5–8.1)

## 2019-03-11 LAB — GLUCOSE, CAPILLARY
Glucose-Capillary: 114 mg/dL — ABNORMAL HIGH (ref 70–99)
Glucose-Capillary: 235 mg/dL — ABNORMAL HIGH (ref 70–99)
Glucose-Capillary: 175 mg/dL — ABNORMAL HIGH (ref 70–99)
Glucose-Capillary: 246 mg/dL — ABNORMAL HIGH (ref 70–99)
Glucose-Capillary: 207 mg/dL — ABNORMAL HIGH (ref 70–99)

## 2019-03-11 LAB — C-REACTIVE PROTEIN: CRP: 0.6 mg/dL (ref <1.0)

## 2019-03-11 MED ORDER — ALPRAZOLAM 0.5 MG PO TABS
0.2500 mg | ORAL_TABLET | Freq: Two times a day (BID) | ORAL | Status: DC | PRN
  Administered 2019-03-12 – 2019-03-30 (×7): 0.25 mg via ORAL
  Filled 2019-03-11 (×8): qty 1

## 2019-03-11 MED ORDER — ENOXAPARIN SODIUM 60 MG/0.6ML ~~LOC~~ SOLN
60.0000 mg | SUBCUTANEOUS | Status: DC
  Administered 2019-03-12 – 2019-04-06 (×26): 60 mg via SUBCUTANEOUS
  Filled 2019-03-11 (×26): qty 0.6

## 2019-03-11 MED ORDER — MORPHINE SULFATE (PF) 2 MG/ML IV SOLN
1.0000 mg | Freq: Four times a day (QID) | INTRAVENOUS | Status: DC | PRN
  Administered 2019-04-03: 1 mg via INTRAVENOUS
  Filled 2019-03-11: qty 1

## 2019-03-11 MED ORDER — HALOPERIDOL LACTATE 5 MG/ML IJ SOLN: 2.0000 mg | Freq: Four times a day (QID) | INTRAMUSCULAR | Status: DC | PRN

## 2019-03-11 NOTE — Progress Notes (Addendum)
Physical Therapy Treatment Patient Details Name: Justin Atkinson. MRN: [id number] DOB: 01-12-1941 Today's Date: 03/11/2019    History of Present Illness Pt is a 78 y.o. male recently admitted s/p VATS with lobe wedge resection (02/09/2019), now admitted 02/25/2019 with worsening cough and SOB; tested (+) COVID-19. Other PMH includes COPD/pulmonary fibrosis, cervical DDD, HTN, DM, HLD.   PT Comments    Pt progressing with mobility. Able to perform short bouts of standing exercise before needing prolonged seated rest to recover with SpO2 down to 71% on 16L HFNC + 15L NRB. Pt requiring min-modA to prevent LOB with standing activity. Continue to recommend intensive CIR-level therapies to maximize functional mobility and independence. Pt motivated to participate and return straight home, but recognizes current need for post-acute rehab.   Follow Up Recommendations  CIR;Supervision for mobility/OOB     Equipment Recommendations  Rolling walker with 5" wheels;3in1 (PT)    Recommendations for Other Services       Precautions / Restrictions Precautions Precautions: Fall;Other (comment) Precaution Comments: HFNC and 15L NRB, quick to desaturate Restrictions Weight Bearing Restrictions: No    Mobility  Bed Mobility               General bed mobility comments: Received sitting in recliner  Transfers Overall transfer level: Needs assistance Equipment used: None;Rolling walker (2 wheeled) Transfers: Sit to/from Stand Sit to Stand: Min assist;Mod assist         General transfer comment: Initial standing without DME, pt with LOB without UE support and falling back to recliner 2x trials required modA to prevent LOB. 2x more standing to RW, stability improved with minA  Ambulation/Gait Ambulation/Gait assistance: Min assist Gait Distance (Feet): 8 Feet Assistive device: Rolling walker (2 wheeled) Gait Pattern/deviations: Step-through pattern;Decreased stride length;Trunk  flexed;Leaning posteriorly   Gait velocity interpretation: <1.31 ft/sec, indicative of household ambulator General Gait Details: Walked 4' forwards 4' backwards with RW, then bout of marching in place before needing seated rest; minA to prevent posterior LOB   Stairs             Wheelchair Mobility    Modified Rankin (Stroke Patients Only)       Balance Overall balance assessment: Needs assistance   Sitting balance-Leahy Scale: Fair       Standing balance-Leahy Scale: Poor                              Cognition Arousal/Alertness: Awake/alert Behavior During Therapy: WFL for tasks assessed/performed Overall Cognitive Status: Within Functional Limits for tasks assessed                                        Exercises Other Exercises Other Exercises: Slow 5x sit<>stand from recliner with SpO2 down to 71% requiring prolonged seated rest to recover Other Exercises: 4' forwards/backwards + marching in place (~10 sec) with SpO2 down to 77% requiring prolonged seated rest to recover Other Exercises: Sitting brushing teeth with SpO2 down to 83% without NRB on Other Exercises: 5x incentive spirometer, 5x flutter valve    General Comments General comments (skin integrity, edema, etc.): 16L HFNC + 15L NRB (w/ 2x extensions)      Pertinent Vitals/Pain Pain Assessment: No/denies pain    Home Living  Prior Function            PT Goals (current goals can now be found in the care plan section) Progress towards PT goals: Progressing toward goals    Frequency    Min 3X/week      PT Plan Current plan remains appropriate    Co-evaluation              AM-PAC PT "6 Clicks" Mobility   Outcome Measure  Help needed turning from your back to your side while in a flat bed without using bedrails?: A Little Help needed moving from lying on your back to sitting on the side of a flat bed without using  bedrails?: A Little Help needed moving to and from a bed to a chair (including a wheelchair)?: A Little Help needed standing up from a chair using your arms (e.g., wheelchair or bedside chair)?: A Little Help needed to walk in hospital room?: A Little Help needed climbing 3-5 steps with a railing? : Total 6 Click Score: 16    End of Session Equipment Utilized During Treatment: Oxygen Activity Tolerance: Patient tolerated treatment well;Treatment limited secondary to medical complications (Comment) Patient left: in chair;with call bell/phone within reach Nurse Communication: Mobility status       Time: 0926-1000 PT Time Calculation (min) (ACUTE ONLY): 34 min  Charges:  $Therapeutic Exercise: 8-22 mins $Therapeutic Activity: 8-22 mins                    Mabeline Caras, PT, DPT Acute Rehabilitation Services  Pager [phone] Office [place] 03/11/2019, 12:01 PM

## 2019-03-11 NOTE — Progress Notes (Signed)
Updated family on POC

## 2019-03-11 NOTE — Progress Notes (Signed)
Pt up to chair x 2 today.  He remains on HFNC with NRB overtop.  I have only been able to wean HFNC down by 1L.  Other VSS.  UOP appropriate.  No acute distress.  BG continue to trend on the higher end.

## 2019-03-11 NOTE — Progress Notes (Addendum)
PROGRESS NOTE    Justin Atkinson.  [id number] DOB: 1940-04-30 DOA: 02/02/2019 PCP: [name], MD   Brief Narrative:  78 year old  WM PMHx COPD/pulmonary fibrosis recently diagnosed hypersensitivity pneumonitis (had VATS wedge resection on 11/9;follows with Dr. Vaughan Browner) who was recently started on steroid on 11/18, HTN, diabetes type 2 uncontrolled with complication, HLD, and DJD   Presented with increasing shortness of breath with nasal congestion, cough for about 10 days. Also noticed that his oxygen dropped to 89% on room air. Called the pulmonary office who asked him to come to the ED. In the ED he is hypoxicat80%, tachypneic and mildly hypertensive. CT angiogram of the chest was negative for PE and showed bilateral diffuse groundglass opacity. Admitted to ICU on high flow started on IV Solu-Medrol. COVID-19 test returned as positive. In the ED he had elevated inflammatory markers including LDH, ferritin, CRP, lactic acid and D-dimer. Patient remains on high flow oxygen. 03/01/2019: Remains 100% high flow nasal cannula oxygen.  Transferring to [place] to continue treatment.  NOTE review of Dr. Vaughan Browner PCCM note 02/18/2019 patient was sent home on 2 L O2, pulmonary fibrosis, RIGHT VATS/11/9 performed showed hypersensitivity pneumonitis   Subjective: 12/9 afebrile last 24 hours, A/O x4, sleepy but arousable.  Negative abdominal pain, negative N/V.  Positive SOB   Assessment & Plan:   Principal Problem:   Acute respiratory failure with hypoxia (HCC) Active Problems:   Hypertension   Hypercholesterolemia   Benign prostate hyperplasia   ILD (interstitial lung disease) (HCC)   Postinflammatory pulmonary fibrosis (HCC)   HCAP (healthcare-associated pneumonia)   Chronic obstructive pulmonary disease ([place])   Pneumonia due to COVID-19 virus   Severe sepsis ([place])   Essential hypertension   Diabetes mellitus type 2, controlled, with complications ([place])  Pulmonary fibrosis (HCC)   Hypersensitivity pneumonitis ([place])   Covid pneumonia/acute respiratory failure with hypoxia COVID-19 Labs  Recent Labs    03/09/19 0502 03/10/19 0310 03/11/19 0535  DDIMER 1.82* 1.67* 1.66*  CRP 0.5 <0.5 0.6    Lab Results  Component Value Date   SARSCOV2NAA POSITIVE (A) 02/26/2019   SARSCOV2NAA NOT DETECTED 02/05/2019   SARSCOV2NAA NOT DETECTED 01/09/2019   -Solu-Medrol 30 mg daily  -Remdesivir per pharmacy protocol -11/29 Actemra x1 dose -11/30 Covid Convalescent Plasma x1  -Albuterol PRN -Flutter valve -Incentive spirometry -Titrate O2 to maintain SPO2> 88% -Prone patient 16 hours/day; if patient cannot tolerate prone at least 2 to 3 hours per shift -Xanax PRN anxiety have decreased dose -Morphine PRN air hunger have decreased dose  HCAP/severe sepsis -Trend procalcitonin Results for SOPHAT, DUBOW (MRN [id number]) as of 03/01/2019 18:22  Ref. Range 02/07/2019 11:47 02/28/2019 07:57 03/01/2019 03:33  Procalcitonin Latest Units: ng/mL 0.13 <0.10 <0.10  -Continue cefepime x7 days  Pulmonary fibrosis/Hypersensitivity Pneumonitis -Per Dr. Vaughan Browner PCCM note 11/9 worsening pulmonary fibrosis s  -11/9 RIGHT VATS; per Dr. Vaughan Browner PCCM note showed hypersensitivity pneumonitis  -See Covid pneumonia  Hypertension -BP medication discontinued patient's BP has been running on the low side. -May improve with titration down of sedating medication  HLD -Crestor 10 mg daily  Diabetes type 2 controlled with complication -123456 hemoglobin A1c= 7.0 -Resistant SSI -12/1 increase Levemir 18 units qhs -12/1 increase NovoLog 6 units qac  Insomnia -Discontinue trazodone -Patient on Seroquel 50 mg qhs   Daytime sleepiness -Slowly decreasing sedating medication benzodiazepine, opiates, Haldol    DVT prophylaxis: Lovenox  Code Status: Full Family Communication: 12/1 spoke with daughter and wife counseled  him on plan of care answered all  questions.  [name] his wife request that we contact [name] (daughter) as [place] [phone] Disposition Plan: TBD   Consultants:    Procedures/Significant Events:  11/30 Covid Convalescent Plasma x1    I have personally reviewed and interpreted all radiology studies and my findings are as above.  VENTILATOR SETTINGS: HFNC + NRB 12/9 Flow rate; 15 L/min FiO2; SPO2; 92%   Cultures 11/27 SARS coronavirus positive 11/29 influenza A/B negative   Antimicrobials: Anti-infectives (From admission, onward)   Start     Stop   03/01/19 0800  remdesivir 100 mg in sodium chloride 0.9 % 250 mL IVPB     03/05/19 0759   02/28/19 1000  remdesivir 200 mg in sodium chloride 0.9 % 250 mL IVPB     02/28/19 1006   02/28/19 0000  vancomycin (VANCOCIN) 1,250 mg in sodium chloride 0.9 % 250 mL IVPB  Status:  Discontinued     03/01/2019 2037   02/14/2019 2200  ceFEPIme (MAXIPIME) 2 g in sodium chloride 0.9 % 100 mL IVPB         02/12/2019 1315  ceFEPIme (MAXIPIME) 2 g in sodium chloride 0.9 % 100 mL IVPB     02/28/19 0324   02/24/2019 1315  vancomycin (VANCOCIN) 2,000 mg in sodium chloride 0.9 % 500 mL IVPB     02/28/19 0324       Devices    LINES / TUBES:      Continuous Infusions:    Objective: Vitals:   03/11/19 0800 03/11/19 0805 03/11/19 1100 03/11/19 1200  BP:  128/73 123/67 138/80  Pulse:  74 73 69  Resp:   16 20  Temp: 97.9 F (36.6 C)   97.7 F (36.5 C)  TempSrc: Axillary   Axillary  SpO2:  (!) 88% 90% 92%  Weight:      Height:        Intake/Output Summary (Last 24 hours) at 03/11/2019 1233 Last data filed at 03/11/2019 [id number] Gross per 24 hour  Intake 1320 ml  Output 2200 ml  Net -880 ml   Filed Weights   02/11/2019 1810 02/28/19 0812 03/06/19 1607  Weight: 130.7 kg 130.7 kg 124.2 kg   Physical Exam:  General: Sleepy but arousable, A/O x4, positive acute respiratory distress Eyes: negative scleral hemorrhage, negative anisocoria, negative icterus ENT: Negative  Runny nose, negative gingival bleeding, Neck:  Negative scars, masses, torticollis, lymphadenopathy, JVD Lungs: Clear to auscultation bilaterally without wheezes or crackles.  Breath sounds coarse Cardiovascular: Regular rate and rhythm without murmur gallop or rub normal S1 and S2 Abdomen: Morbidly obese, negative abdominal pain, nondistended, positive soft, bowel sounds, no rebound, no ascites, no appreciable mass Extremities: No significant cyanosis, clubbing, or edema bilateral lower extremities Skin: Negative rashes, lesions, ulcers Psychiatric:  Negative depression, negative anxiety, negative fatigue, negative mania  Central nervous system:  Cranial nerves II through XII intact, tongue/uvula midline, all extremities muscle strength 5/5, sensation intact throughout, negative dysarthria, negative expressive aphasia, negative receptive aphasia.      Data Reviewed: Care during the described time interval was provided by me .  I have reviewed this patient's available data, including medical history, events of note, physical examination, and all test results as part of my evaluation.   CBC: Recent Labs  Lab 03/05/19 0350 03/06/19 0303 03/08/19 0442 03/09/19 0502 03/10/19 0310 03/11/19 0535  WBC 11.4* 11.7* 11.6* 11.5* 10.4 11.3*  NEUTROABS 9.9* 10.0*  --   --   --   --  HGB 15.8 15.9 15.6 14.9 14.8 15.7  HCT 48.7 49.8 48.6 46.0 46.3 48.3  MCV 93.8 94.5 95.3 94.5 94.5 94.7  PLT 323 321 250 215 186 0000000   Basic Metabolic Panel: Recent Labs  Lab 03/05/19 0350 03/06/19 0303 03/07/19 0250 03/08/19 0442 03/09/19 0502 03/10/19 0310 03/11/19 0535  NA 143 142 143 140 139 139 137  K 3.9 3.5 4.0 4.3 4.1 4.1 4.4  CL 103 102 101 98 98 98 95*  CO2 26 28 29 31 29 31  33*  GLUCOSE 178* 146* 139* 168* 152* 102* 113*  BUN 55* 77* 80* 75* 60* 56* 49*  CREATININE 1.12 1.63* 1.58* 1.40* 1.13 1.12 1.09  CALCIUM 9.0 8.7* 9.1 9.3 9.2 9.3 9.2  MG 2.6* 2.9* 2.9*  --   --   --   --   PHOS 4.5  6.0*  --   --   --   --   --    GFR: Estimated Creatinine Clearance: 79.6 mL/min (by C-G formula based on SCr of 1.09 mg/dL). Liver Function Tests: Recent Labs  Lab 03/07/19 0250 03/08/19 0442 03/09/19 0502 03/10/19 0310 03/11/19 0535  AST 23 22 22 22 24   ALT 46* 44 39 41 49*  ALKPHOS 96 90 89 83 86  BILITOT 1.0 1.3* 1.4* 1.1 1.0  PROT 6.2* 6.4* 5.9* 6.1* 6.3*  ALBUMIN 3.1* 3.3* 3.2* 3.3* 3.5   No results for input(s): LIPASE, AMYLASE in the last 168 hours. No results for input(s): AMMONIA in the last 168 hours. Coagulation Profile: No results for input(s): INR, PROTIME in the last 168 hours. Cardiac Enzymes: No results for input(s): CKTOTAL, CKMB, CKMBINDEX, TROPONINI in the last 168 hours. BNP (last 3 results) No results for input(s): PROBNP in the last 8760 hours. HbA1C: No results for input(s): HGBA1C in the last 72 hours. CBG: Recent Labs  Lab 03/09/19 2247 03/10/19 0757 03/10/19 1629 03/11/19 0802 03/11/19 1126  GLUCAP 192* 116* 233* 114* 235*   Lipid Profile: No results for input(s): CHOL, HDL, LDLCALC, TRIG, CHOLHDL, LDLDIRECT in the last 72 hours. Thyroid Function Tests: No results for input(s): TSH, T4TOTAL, FREET4, T3FREE, THYROIDAB in the last 72 hours. Anemia Panel: No results for input(s): VITAMINB12, FOLATE, FERRITIN, TIBC, IRON, RETICCTPCT in the last 72 hours. Urine analysis:    Component Value Date/Time   COLORURINE YELLOW 02/24/2019 1915   APPEARANCEUR CLEAR 02/09/2019 1915   LABSPEC >1.046 (H) 02/28/2019 1915   PHURINE 6.0 02/03/2019 1915   GLUCOSEU NEGATIVE 02/13/2019 1915   HGBUR NEGATIVE 02/24/2019 1915   [place] NEGATIVE 02/12/2019 [place] NEGATIVE 02/15/2019 1915   PROTEINUR NEGATIVE 02/22/2019 1915   NITRITE NEGATIVE 02/08/2019 1915   LEUKOCYTESUR NEGATIVE 02/03/2019 1915   Sepsis Labs: @LABRCNTIP (procalcitonin:4,lacticidven:4)  ) No results found for this or any previous visit (from the past 240 hour(s)).        Radiology Studies: No results found.      Scheduled Meds: . aspirin EC  81 mg Oral Daily  . Chlorhexidine Gluconate Cloth  6 each Topical Daily  . [START ON 03/12/2019] enoxaparin (LOVENOX) injection  60 mg Subcutaneous Q24H  . fluticasone  2 spray Each Nare Daily  . guaiFENesin  1,200 mg Oral BID  . insulin aspart  0-20 Units Subcutaneous TID WC  . insulin aspart  6 Units Subcutaneous TID WC  . insulin detemir  12 Units Subcutaneous QHS  . insulin detemir  14 Units Subcutaneous Daily  . latanoprost  1 drop Both Eyes QHS  .  loratadine  10 mg Oral Daily  . mouth rinse  15 mL Mouth Rinse BID  . methylPREDNISolone (SOLU-MEDROL) injection  30 mg Intravenous Daily  . omega-3 acid ethyl esters  1 g Oral Daily  . pantoprazole  40 mg Oral Daily  . QUEtiapine  50 mg Oral QHS  . rosuvastatin  10 mg Oral Daily  . timolol  1 drop Left Eye BID  . traZODone  25 mg Oral QHS  . umeclidinium-vilanterol  1 puff Inhalation Daily   Continuous Infusions:    LOS: 12 days   The patient is critically ill with multiple organ systems failure and requires high complexity decision making for assessment and support, frequent evaluation and titration of therapies, application of advanced monitoring technologies and extensive interpretation of multiple databases. Critical Care Time devoted to patient care services described in this note  Time spent: 40 minutes     , Geraldo Docker, MD [facility] Pager [phone]  If 7PM-7AM, please contact night-coverage [email or web address] Password [facility] 03/11/2019, 12:33 PM

## 2019-03-12 LAB — FERRITIN: Ferritin: 468 ng/mL — ABNORMAL HIGH (ref 24–336)

## 2019-03-12 LAB — CBC WITH DIFFERENTIAL/PLATELET
Abs Immature Granulocytes: 0.06 10*3/uL (ref 0.00–0.07)
Basophils Absolute: 0 10*3/uL (ref 0.0–0.1)
Basophils Relative: 0 %
Eosinophils Absolute: 0.3 10*3/uL (ref 0.0–0.5)
Eosinophils Relative: 3 %
HCT: 46.1 % (ref 39.0–52.0)
Hemoglobin: 15.1 g/dL (ref 13.0–17.0)
Immature Granulocytes: 1 %
Lymphocytes Relative: 7 %
Lymphs Abs: 0.8 10*3/uL (ref 0.7–4.0)
MCH: 30.6 pg (ref 26.0–34.0)
MCHC: 32.8 g/dL (ref 30.0–36.0)
MCV: 93.5 fL (ref 80.0–100.0)
Monocytes Absolute: 0.5 10*3/uL (ref 0.1–1.0)
Monocytes Relative: 4 %
Neutro Abs: 10.4 10*3/uL — ABNORMAL HIGH (ref 1.7–7.7)
Neutrophils Relative %: 85 %
Platelets: 163 10*3/uL (ref 150–400)
RBC: 4.93 MIL/uL (ref 4.22–5.81)
RDW: 13.3 % (ref 11.5–15.5)
WBC: 12.1 10*3/uL — ABNORMAL HIGH (ref 4.0–10.5)
nRBC: 0 % (ref 0.0–0.2)

## 2019-03-12 LAB — COMPREHENSIVE METABOLIC PANEL
ALT: 50 U/L — ABNORMAL HIGH (ref 0–44)
AST: 25 U/L (ref 15–41)
Albumin: 3.1 g/dL — ABNORMAL LOW (ref 3.5–5.0)
Alkaline Phosphatase: 80 U/L (ref 38–126)
Anion gap: 12 (ref 5–15)
BUN: 41 mg/dL — ABNORMAL HIGH (ref 8–23)
CO2: 29 mmol/L (ref 22–32)
Calcium: 9.2 mg/dL (ref 8.9–10.3)
Chloride: 97 mmol/L — ABNORMAL LOW (ref 98–111)
Creatinine, Ser: 0.87 mg/dL (ref 0.61–1.24)
GFR calc Af Amer: >60 mL/min (ref >60)
GFR calc non Af Amer: >60 mL/min (ref >60)
Glucose, Bld: 98 mg/dL (ref 70–99)
Potassium: 4.1 mmol/L (ref 3.5–5.1)
Sodium: 138 mmol/L (ref 135–145)
Total Bilirubin: 0.8 mg/dL (ref 0.3–1.2)
Total Protein: 6 g/dL — ABNORMAL LOW (ref 6.5–8.1)

## 2019-03-12 LAB — EXPECTORATED SPUTUM ASSESSMENT W GRAM STAIN, RFLX TO RESP C

## 2019-03-12 LAB — GLUCOSE, CAPILLARY
Glucose-Capillary: 117 mg/dL — ABNORMAL HIGH (ref 70–99)
Glucose-Capillary: 213 mg/dL — ABNORMAL HIGH (ref 70–99)
Glucose-Capillary: 217 mg/dL — ABNORMAL HIGH (ref 70–99)
Glucose-Capillary: 191 mg/dL — ABNORMAL HIGH (ref 70–99)

## 2019-03-12 LAB — MAGNESIUM: Magnesium: 2.3 mg/dL (ref 1.7–2.4)

## 2019-03-12 LAB — D-DIMER, QUANTITATIVE: D-Dimer, Quant: 1.47 ug/mL-FEU — ABNORMAL HIGH (ref 0.00–0.50)

## 2019-03-12 LAB — PHOSPHORUS: Phosphorus: 3.1 mg/dL (ref 2.5–4.6)

## 2019-03-12 LAB — C-REACTIVE PROTEIN: CRP: 0.6 mg/dL (ref <1.0)

## 2019-03-12 NOTE — Progress Notes (Addendum)
PROGRESS NOTE    Justin Atkinson.  [id number] DOB: May 15, 1940 DOA: 02/02/2019 PCP: [name], MD   Brief Narrative:  78 year old  WM PMHx COPD/pulmonary fibrosis recently diagnosed hypersensitivity pneumonitis (had VATS wedge resection on 11/9;follows with Dr. Vaughan Browner) who was recently started on steroid on 11/18, HTN, diabetes type 2 uncontrolled with complication, HLD, and DJD   Presented with increasing shortness of breath with nasal congestion, cough for about 10 days. Also noticed that his oxygen dropped to 89% on room air. Called the pulmonary office who asked him to come to the ED. In the ED he is hypoxicat80%, tachypneic and mildly hypertensive. CT angiogram of the chest was negative for PE and showed bilateral diffuse groundglass opacity. Admitted to ICU on high flow started on IV Solu-Medrol. COVID-19 test returned as positive. In the ED he had elevated inflammatory markers including LDH, ferritin, CRP, lactic acid and D-dimer. Patient remains on high flow oxygen. 03/01/2019: Remains 100% high flow nasal cannula oxygen.  Transferring to [place] to continue treatment.  NOTE review of Dr. Vaughan Browner PCCM note 02/18/2019 patient was sent home on 2 L O2, pulmonary fibrosis, RIGHT VATS/11/9 performed showed hypersensitivity pneumonitis   Subjective: 12/10 last 24 hours afebrile.  A/O x4, sitting in chair comfortably.  Negative abdominal pain.  Negative N/V.  Positive S OB.  Carry on complex conversation concerning the course of Covid within the country.   Assessment & Plan:   Principal Problem:   Acute respiratory failure with hypoxia (HCC) Active Problems:   Hypertension   Hypercholesterolemia   Benign prostate hyperplasia   ILD (interstitial lung disease) (HCC)   Postinflammatory pulmonary fibrosis (HCC)   HCAP (healthcare-associated pneumonia)   Chronic obstructive pulmonary disease ([place])   Pneumonia due to COVID-19 virus   Severe sepsis ([place])  Essential hypertension   Diabetes mellitus type 2, controlled, with complications ([place])   Pulmonary fibrosis (HCC)   Hypersensitivity pneumonitis ([place])   Covid pneumonia/acute respiratory failure with hypoxia COVID-19 Labs  Recent Labs    03/10/19 0310 03/11/19 0535 03/12/19 0400  DDIMER 1.67* 1.66* 1.47*  FERRITIN  --   --  468*  CRP <0.5 0.6 0.6    Lab Results  Component Value Date   SARSCOV2NAA POSITIVE (A) 02/18/2019   SARSCOV2NAA NOT DETECTED 02/05/2019   SARSCOV2NAA NOT DETECTED 01/09/2019   -Solu-Medrol 30 mg daily  -Remdesivir per pharmacy protocol -11/29 Actemra x1 dose -11/30 Covid Convalescent Plasma x1  -Albuterol PRN -Flutter valve -Incentive spirometry -Titrate O2 to maintain SPO2> 88% -Prone patient 16 hours/day; if patient cannot tolerate prone at least 2 to 3 hours per shift -Xanax PRN anxiety have decreased dose -Morphine PRN air hunger have decreased dose  HCAP/severe sepsis -Trend procalcitonin Results for ROSALIE, AKEY (MRN [id number]) as of 03/01/2019 18:22  Ref. Range 02/16/2019 11:47 02/28/2019 07:57 03/01/2019 03:33  Procalcitonin Latest Units: ng/mL 0.13 <0.10 <0.10  -Completed cefepime -12/10 sputum culture pending; productive cough thick yellow sputum.   Pulmonary fibrosis/Hypersensitivity Pneumonitis -Per Dr. Vaughan Browner PCCM note 11/9 worsening pulmonary fibrosis s  -11/9 RIGHT VATS; per Dr. Vaughan Browner PCCM note showed hypersensitivity pneumonitis  -See Covid pneumonia  Hypertension -BP medication discontinued patient's BP has been running on the low side. -12/10 improvement in patient's BP with decrease in sedating medication.  Still does not require BP medication.  HLD -Crestor 10 mg daily  Diabetes type 2 controlled with complication -123456 hemoglobin A1c= 7.0 -Resistant SSI -Levemir 14 units daily/12 units qhs -NovoLog 6 units  qac -Resistant SSI  Insomnia -Discontinue trazodone -Patient on Seroquel 50 mg qhs   Daytime  sleepiness -Slowly decreasing sedating medication benzodiazepine, opiates, Haldol    DVT prophylaxis: Lovenox  Code Status: Full Family Communication: 12/10 spoke with daughter and wife counseled him on plan of care answered all questions.  [name] his wife request that we contact [name] (daughter) as [place] [phone] Disposition Plan: TBD   Consultants:    Procedures/Significant Events:  11/30 Covid Convalescent Plasma x1    I have personally reviewed and interpreted all radiology studies and my findings are as above.  VENTILATOR SETTINGS: HFNC + NRB 12/10 Flow rate; 15 L/min FiO2; SPO2; 90%   Cultures 11/27 SARS coronavirus positive 11/29 influenza A/B negative   Antimicrobials: Anti-infectives (From admission, onward)   Start     Dose/Rate Stop   03/01/19 0800  remdesivir 100 mg in sodium chloride 0.9 % 250 mL IVPB     100 mg 500 mL/hr over 30 Minutes 03/04/19 1216   02/28/19 1000  remdesivir 200 mg in sodium chloride 0.9 % 250 mL IVPB     200 mg 500 mL/hr over 30 Minutes 02/28/19 1006   02/28/19 0000  vancomycin (VANCOCIN) 1,250 mg in sodium chloride 0.9 % 250 mL IVPB  Status:  Discontinued     1,250 mg 166.7 mL/hr over 90 Minutes 02/16/2019 2037   02/23/2019 2200  ceFEPIme (MAXIPIME) 2 g in sodium chloride 0.9 % 100 mL IVPB  Status:  Discontinued     2 g 200 mL/hr over 30 Minutes 03/03/19 1242   02/24/2019 1315  ceFEPIme (MAXIPIME) 2 g in sodium chloride 0.9 % 100 mL IVPB     2 g 200 mL/hr over 30 Minutes 02/28/19 0324   02/20/2019 1315  vancomycin (VANCOCIN) 2,000 mg in sodium chloride 0.9 % 500 mL IVPB     2,000 mg 250 mL/hr over 120 Minutes 02/28/19 0324       Devices    LINES / TUBES:      Continuous Infusions:    Objective: Vitals:   03/12/19 0400 03/12/19 0757 03/12/19 1138 03/12/19 1221  BP: 135/72 132/72 132/72   Pulse: (!) 58 95 74   Resp: 13 (!) 21 (!) 22   Temp: 97.6 F (36.4 C) 98.5 F (36.9 C)  97.7 F (36.5 C)  TempSrc: Oral  Axillary  Axillary  SpO2: 96% (!) 83% (!) 86%   Weight:      Height:        Intake/Output Summary (Last 24 hours) at 03/12/2019 1233 Last data filed at 03/12/2019 1221 Gross per 24 hour  Intake 360 ml  Output 1500 ml  Net -1140 ml   Filed Weights   02/28/2019 1810 02/28/19 0812 03/06/19 1607  Weight: 130.7 kg 130.7 kg 124.2 kg   Physical Exam:  General: A/O x4, positive acute respiratory distress Eyes: negative scleral hemorrhage, negative anisocoria, negative icterus ENT: Negative Runny nose, negative gingival bleeding, Neck:  Negative scars, masses, torticollis, lymphadenopathy, JVD Lungs: Positive mild rhonchi, positive productive cough thick yellow sputum, without wheezes or crackles Cardiovascular: Regular rate and rhythm without murmur gallop or rub normal S1 and S2 Abdomen: negative abdominal pain, nondistended, positive soft, bowel sounds, no rebound, no ascites, no appreciable mass Extremities: No significant cyanosis, clubbing, or edema bilateral lower extremities Skin: Negative rashes, lesions, ulcers Psychiatric:  Negative depression, negative anxiety, negative fatigue, negative mania  Central nervous system:  Cranial nerves II through XII intact, tongue/uvula midline, all extremities muscle strength 5/5,  sensation intact throughout, negative dysarthria, negative expressive aphasia, negative receptive aphasia.      Data Reviewed: Care during the described time interval was provided by me .  I have reviewed this patient's available data, including medical history, events of note, physical examination, and all test results as part of my evaluation.   CBC: Recent Labs  Lab 03/06/19 0303 03/08/19 0442 03/09/19 0502 03/10/19 0310 03/11/19 0535 03/12/19 0400  WBC 11.7* 11.6* 11.5* 10.4 11.3* 12.1*  NEUTROABS 10.0*  --   --   --   --  10.4*  HGB 15.9 15.6 14.9 14.8 15.7 15.1  HCT 49.8 48.6 46.0 46.3 48.3 46.1  MCV 94.5 95.3 94.5 94.5 94.7 93.5  PLT 321 250 215 186  158 [id number]   Basic Metabolic Panel: Recent Labs  Lab 03/06/19 0303 03/07/19 0250 03/08/19 0442 03/09/19 0502 03/10/19 0310 03/11/19 0535 03/12/19 0400  NA 142 143 140 139 139 137 138  K 3.5 4.0 4.3 4.1 4.1 4.4 4.1  CL 102 101 98 98 98 95* 97*  CO2 28 29 31 29 31  33* 29  GLUCOSE 146* 139* 168* 152* 102* 113* 98  BUN 77* 80* 75* 60* 56* 49* 41*  CREATININE 1.63* 1.58* 1.40* 1.13 1.12 1.09 0.87  CALCIUM 8.7* 9.1 9.3 9.2 9.3 9.2 9.2  MG 2.9* 2.9*  --   --   --   --  2.3  PHOS 6.0*  --   --   --   --   --  3.1   GFR: Estimated Creatinine Clearance: 99.7 mL/min (by C-G formula based on SCr of 0.87 mg/dL). Liver Function Tests: Recent Labs  Lab 03/08/19 0442 03/09/19 0502 03/10/19 0310 03/11/19 0535 03/12/19 0400  AST 22 22 22 24 25   ALT 44 39 41 49* 50*  ALKPHOS 90 89 83 86 80  BILITOT 1.3* 1.4* 1.1 1.0 0.8  PROT 6.4* 5.9* 6.1* 6.3* 6.0*  ALBUMIN 3.3* 3.2* 3.3* 3.5 3.1*   No results for input(s): LIPASE, AMYLASE in the last 168 hours. No results for input(s): AMMONIA in the last 168 hours. Coagulation Profile: No results for input(s): INR, PROTIME in the last 168 hours. Cardiac Enzymes: No results for input(s): CKTOTAL, CKMB, CKMBINDEX, TROPONINI in the last 168 hours. BNP (last 3 results) No results for input(s): PROBNP in the last 8760 hours. HbA1C: No results for input(s): HGBA1C in the last 72 hours. CBG: Recent Labs  Lab 03/11/19 1631 03/11/19 2046 03/11/19 2143 03/12/19 0749 03/12/19 1146  GLUCAP 175* 246* 207* 117* 213*   Lipid Profile: No results for input(s): CHOL, HDL, LDLCALC, TRIG, CHOLHDL, LDLDIRECT in the last 72 hours. Thyroid Function Tests: No results for input(s): TSH, T4TOTAL, FREET4, T3FREE, THYROIDAB in the last 72 hours. Anemia Panel: Recent Labs    03/12/19 0400  FERRITIN 468*   Urine analysis:    Component Value Date/Time   COLORURINE YELLOW 02/02/2019 1915   APPEARANCEUR CLEAR 02/04/2019 1915   LABSPEC >1.046 (H) 02/03/2019  1915   PHURINE 6.0 02/23/2019 1915   GLUCOSEU NEGATIVE 02/01/2019 1915   HGBUR NEGATIVE 02/03/2019 1915   [place] NEGATIVE 02/24/2019 [place] 02/05/2019 1915   PROTEINUR NEGATIVE 02/05/2019 1915   NITRITE NEGATIVE 02/09/2019 1915   LEUKOCYTESUR NEGATIVE 02/05/2019 1915   Sepsis Labs: @LABRCNTIP (procalcitonin:4,lacticidven:4)  ) No results found for this or any previous visit (from the past 240 hour(s)).       Radiology Studies: No results found.      Scheduled Meds: .  aspirin EC  81 mg Oral Daily  . Chlorhexidine Gluconate Cloth  6 each Topical Daily  . enoxaparin (LOVENOX) injection  60 mg Subcutaneous Q24H  . fluticasone  2 spray Each Nare Daily  . guaiFENesin  1,200 mg Oral BID  . insulin aspart  0-20 Units Subcutaneous TID WC  . insulin aspart  6 Units Subcutaneous TID WC  . insulin detemir  12 Units Subcutaneous QHS  . insulin detemir  14 Units Subcutaneous Daily  . latanoprost  1 drop Both Eyes QHS  . loratadine  10 mg Oral Daily  . mouth rinse  15 mL Mouth Rinse BID  . methylPREDNISolone (SOLU-MEDROL) injection  30 mg Intravenous Daily  . omega-3 acid ethyl esters  1 g Oral Daily  . pantoprazole  40 mg Oral Daily  . QUEtiapine  50 mg Oral QHS  . rosuvastatin  10 mg Oral Daily  . timolol  1 drop Left Eye BID  . umeclidinium-vilanterol  1 puff Inhalation Daily   Continuous Infusions:    LOS: 13 days   The patient is critically ill with multiple organ systems failure and requires high complexity decision making for assessment and support, frequent evaluation and titration of therapies, application of advanced monitoring technologies and extensive interpretation of multiple databases. Critical Care Time devoted to patient care services described in this note  Time spent: 40 minutes     Kirstein Baxley, Geraldo Docker, MD [facility] Pager [phone]  If 7PM-7AM, please contact night-coverage [email or web address] Password [facility] 03/12/2019,  12:33 PM

## 2019-03-12 NOTE — Progress Notes (Signed)
PT Cancellation Note  Patient Details Name: Justin Atkinson. MRN: [id number] DOB: February 27, 1941   Cancelled Treatment:    Reason Eval/Treat Not Completed: Fatigue/lethargy limiting ability to participate. Pt fatigued having just transferred to/[facility] with NT. Of note, SpO2 down to 83% on 16L HFNC with NRB (pt holds away from mouth to talk).   Increased time spent discussing pt's wishes to d/c home with [facility] services instead of CIR/SNF. Pt spoke with family who will check if they have necessary DME (RW, 3in1, w/c...); family has set up home so pt can live on main level with no steps; will be able to have 24/7 assist.  Will check back tomorrow for PT treatment.   Mabeline Caras, PT, DPT Acute Rehabilitation Services  Pager [phone] Office [place] 03/12/2019, 4:50 PM

## 2019-03-13 LAB — COMPREHENSIVE METABOLIC PANEL
ALT: 48 U/L — ABNORMAL HIGH (ref 0–44)
AST: 19 U/L (ref 15–41)
Albumin: 3 g/dL — ABNORMAL LOW (ref 3.5–5.0)
Alkaline Phosphatase: 73 U/L (ref 38–126)
Anion gap: 11 (ref 5–15)
BUN: 36 mg/dL — ABNORMAL HIGH (ref 8–23)
CO2: 32 mmol/L (ref 22–32)
Calcium: 9.1 mg/dL (ref 8.9–10.3)
Chloride: 96 mmol/L — ABNORMAL LOW (ref 98–111)
Creatinine, Ser: 0.98 mg/dL (ref 0.61–1.24)
GFR calc Af Amer: >60 mL/min (ref >60)
GFR calc non Af Amer: >60 mL/min (ref >60)
Glucose, Bld: 86 mg/dL (ref 70–99)
Potassium: 4 mmol/L (ref 3.5–5.1)
Sodium: 139 mmol/L (ref 135–145)
Total Bilirubin: 0.7 mg/dL (ref 0.3–1.2)
Total Protein: 5.7 g/dL — ABNORMAL LOW (ref 6.5–8.1)

## 2019-03-13 LAB — CBC WITH DIFFERENTIAL/PLATELET
Abs Immature Granulocytes: 0.03 10*3/uL (ref 0.00–0.07)
Basophils Absolute: 0 10*3/uL (ref 0.0–0.1)
Basophils Relative: 0 %
Eosinophils Absolute: 0.4 10*3/uL (ref 0.0–0.5)
Eosinophils Relative: 4 %
HCT: 45.3 % (ref 39.0–52.0)
Hemoglobin: 14.6 g/dL (ref 13.0–17.0)
Immature Granulocytes: 0 %
Lymphocytes Relative: 7 %
Lymphs Abs: 0.7 10*3/uL (ref 0.7–4.0)
MCH: 30.8 pg (ref 26.0–34.0)
MCHC: 32.2 g/dL (ref 30.0–36.0)
MCV: 95.6 fL (ref 80.0–100.0)
Monocytes Absolute: 0.4 10*3/uL (ref 0.1–1.0)
Monocytes Relative: 4 %
Neutro Abs: 8.7 10*3/uL — ABNORMAL HIGH (ref 1.7–7.7)
Neutrophils Relative %: 85 %
Platelets: 146 10*3/uL — ABNORMAL LOW (ref 150–400)
RBC: 4.74 MIL/uL (ref 4.22–5.81)
RDW: 13.6 % (ref 11.5–15.5)
WBC: 10.3 10*3/uL (ref 4.0–10.5)
nRBC: 0 % (ref 0.0–0.2)

## 2019-03-13 LAB — PHOSPHORUS: Phosphorus: 3.7 mg/dL (ref 2.5–4.6)

## 2019-03-13 LAB — GLUCOSE, CAPILLARY
Glucose-Capillary: 95 mg/dL (ref 70–99)
Glucose-Capillary: 223 mg/dL — ABNORMAL HIGH (ref 70–99)
Glucose-Capillary: 166 mg/dL — ABNORMAL HIGH (ref 70–99)
Glucose-Capillary: 232 mg/dL — ABNORMAL HIGH (ref 70–99)

## 2019-03-13 LAB — FERRITIN: Ferritin: 399 ng/mL — ABNORMAL HIGH (ref 24–336)

## 2019-03-13 LAB — C-REACTIVE PROTEIN: CRP: 0.6 mg/dL (ref <1.0)

## 2019-03-13 LAB — MAGNESIUM: Magnesium: 2.3 mg/dL (ref 1.7–2.4)

## 2019-03-13 LAB — D-DIMER, QUANTITATIVE: D-Dimer, Quant: 1.41 ug/mL-FEU — ABNORMAL HIGH (ref 0.00–0.50)

## 2019-03-13 NOTE — Progress Notes (Signed)
PROGRESS NOTE    Justin Atkinson.  [id number] DOB: Jan 22, 1941 DOA: 02/04/2019 PCP: Justin Infante, MD   Brief Narrative:  78 year old  WM PMHx COPD/pulmonary fibrosis recently diagnosed hypersensitivity pneumonitis (had VATS wedge resection on 11/9;follows with Dr. Vaughan Atkinson) who was recently started on steroid on 11/18, HTN, diabetes type 2 uncontrolled with complication, HLD, and DJD   Presented with increasing shortness of breath with nasal congestion, cough for about 10 days. Also noticed that his oxygen dropped to 89% on room air. Called the pulmonary office who asked him to come to the ED. In the ED he is hypoxicat80%, tachypneic and mildly hypertensive. CT angiogram of the chest was negative for PE and showed bilateral diffuse groundglass opacity. Admitted to ICU on high flow started on IV Solu-Medrol. COVID-19 test returned as positive. In the ED he had elevated inflammatory markers including LDH, ferritin, CRP, lactic acid and D-dimer. Patient remains on high flow oxygen. 03/01/2019: Remains 100% high flow nasal cannula oxygen.  Transferring to Justin Atkinson to continue treatment.  NOTE review of Dr. Vaughan Atkinson Justin Atkinson note 02/18/2019 patient was sent home on 2 L O2, pulmonary fibrosis, RIGHT VATS/11/9 performed showed hypersensitivity pneumonitis   Subjective: 12/11 last 24 hours afebrile A/O x4, sitting in chair comfortably.  Negative abdominal pain.  Negative N/V.  Positive S OB.  Patient depressed secondary to not making as much improvement as he thinks he should.    Assessment & Plan:   Principal Problem:   Acute respiratory failure with hypoxia (Justin Atkinson) Active Problems:   Hypertension   Hypercholesterolemia   Benign prostate hyperplasia   ILD (interstitial lung disease) (Justin Atkinson)   Postinflammatory pulmonary fibrosis (Justin Atkinson)   HCAP (healthcare-associated pneumonia)   Chronic obstructive pulmonary disease (Justin Atkinson)   Pneumonia due to COVID-19 virus   Severe sepsis  (Justin Atkinson)   Essential hypertension   Diabetes mellitus type 2, controlled, with complications (Justin Atkinson)   Pulmonary fibrosis (Justin Atkinson)   Hypersensitivity pneumonitis (Justin Atkinson)   Covid pneumonia/acute respiratory failure with hypoxia COVID-19 Labs  Recent Labs    03/11/19 0535 03/12/19 0400 03/13/19 0337  DDIMER 1.66* 1.47* 1.41*  FERRITIN  --  468* 399*  CRP 0.6 0.6 0.6    Lab Results  Component Value Date   SARSCOV2NAA POSITIVE (A) 02/11/2019   SARSCOV2NAA NOT DETECTED 02/05/2019   SARSCOV2NAA NOT DETECTED 01/09/2019   -Solu-Medrol 30 mg daily  -Remdesivir per pharmacy protocol -11/29 Actemra x1 dose -11/30 Covid Convalescent Plasma x1  -Albuterol PRN -Flutter valve -Incentive spirometry -Titrate O2 to maintain SPO2> 88% -Prone patient 16 hours/day; if patient cannot tolerate prone at least 2 to 3 hours per shift -Xanax PRN anxiety have decreased dose -Morphine PRN air hunger have decreased dose  HCAP/severe sepsis -Trend procalcitonin Results for Justin, Atkinson (MRN [id number]) as of 03/01/2019 18:22  Ref. Range 02/18/2019 11:47 02/28/2019 07:57 03/01/2019 03:33  Procalcitonin Latest Units: ng/mL 0.13 <0.10 <0.10  -Completed cefepime -12/10 sputum reintubated  Pulmonary fibrosis/Hypersensitivity Pneumonitis -Per Dr. Vaughan Atkinson Justin Atkinson note 11/9 worsening pulmonary fibrosis s  -11/9 RIGHT VATS; per Dr. Vaughan Atkinson Justin Atkinson note showed hypersensitivity pneumonitis  -See Covid pneumonia  Hypertension -BP medication discontinued patient's BP has been running on the low side. -12/10 improvement in patient's BP with decrease in sedating medication.  Still does not require BP medication.  HLD -Crestor 10 mg daily  Diabetes type 2 controlled with complication -123456 hemoglobin A1c= 7.0 -Resistant SSI -Levemir 14 units daily/12 units qhs -NovoLog 6 units qac -Resistant SSI  Insomnia -  Discontinue trazodone -Patient on Seroquel 50 mg qhs   Daytime sleepiness -Slowly decreasing  sedating medication benzodiazepine, opiates, Haldol    DVT prophylaxis: Lovenox  Code Status: Full Family Communication: 12/10 spoke with daughter and wife counseled him on plan of care answered all questions.  Justin Atkinson his wife request that we contact Justin Atkinson (daughter) as [facility] [place] [phone] Disposition Plan: TBD   Consultants:    Procedures/Significant Events:  11/30 Covid Convalescent Plasma x1    I have personally reviewed and interpreted all radiology studies and my findings are as above.  VENTILATOR SETTINGS: HFNC + NRB 12/11 Flow rate; 15 L/min FiO2; 100% SPO2; 93%   Cultures 11/27 SARS coronavirus positive 11/29 influenza A/B negative 12/10 sputum reincubated for better growth    Antimicrobials: Anti-infectives (From admission, onward)   Start     Dose/Rate Stop   03/01/19 0800  remdesivir 100 mg in sodium chloride 0.9 % 250 mL IVPB     100 mg 500 mL/hr over 30 Minutes 03/04/19 1216   02/28/19 1000  remdesivir 200 mg in sodium chloride 0.9 % 250 mL IVPB     200 mg 500 mL/hr over 30 Minutes 02/28/19 1006   02/28/19 0000  vancomycin (VANCOCIN) 1,250 mg in sodium chloride 0.9 % 250 mL IVPB  Status:  Discontinued     1,250 mg 166.7 mL/hr over 90 Minutes 03/01/2019 2037   02/20/2019 2200  ceFEPIme (MAXIPIME) 2 g in sodium chloride 0.9 % 100 mL IVPB  Status:  Discontinued     2 g 200 mL/hr over 30 Minutes 03/03/19 1242   02/11/2019 1315  ceFEPIme (MAXIPIME) 2 g in sodium chloride 0.9 % 100 mL IVPB     2 g 200 mL/hr over 30 Minutes 02/28/19 0324   02/05/2019 1315  vancomycin (VANCOCIN) 2,000 mg in sodium chloride 0.9 % 500 mL IVPB     2,000 mg 250 mL/hr over 120 Minutes 02/28/19 0324       Devices    LINES / TUBES:      Continuous Infusions:    Objective: Vitals:   03/13/19 0740 03/13/19 0745 03/13/19 0750 03/13/19 0755  BP:      Pulse: 70 67 67 71  Resp: 16 (!) 21 19 (!) 25  Temp:      TempSrc:      SpO2: 91% 90% 93% 94%  Weight:      Height:         Intake/Output Summary (Last 24 hours) at 03/13/2019 0834 Last data filed at 03/13/2019 0735 Gross per 24 hour  Intake --  Output 920 ml  Net -920 ml   Filed Weights   02/07/2019 1810 02/28/19 0812 03/06/19 1607  Weight: 130.7 kg 130.7 kg 124.2 kg    Physical Exam:  General: A/O x4, positive acute respiratory distress Eyes: negative scleral hemorrhage, negative anisocoria, negative icterus ENT: Negative Runny nose, negative gingival bleeding, Neck:  Negative scars, masses, torticollis, lymphadenopathy, JVD Lungs: Positive mild rhonchi, positive mild bibasilar crackles  Cardiovascular: Regular rate and rhythm without murmur gallop or rub normal S1 and S2 Abdomen: negative abdominal pain, nondistended, positive soft, bowel sounds, no rebound, no ascites, no appreciable mass Extremities: No significant cyanosis, clubbing, or edema bilateral lower extremities Skin: Negative rashes, lesions, ulcers Psychiatric:  Negative depression, negative anxiety, negative fatigue, negative mania  Central nervous system:  Cranial nerves II through XII intact, tongue/uvula midline, all extremities muscle strength 5/5, sensation intact throughout, negative dysarthria, negative expressive aphasia, negative receptive aphasia.  Data Reviewed: Care during the described time interval was provided by me .  I have reviewed this patient's available data, including medical history, events of note, physical examination, and all test results as part of my evaluation.   CBC: Recent Labs  Lab 03/09/19 0502 03/10/19 0310 03/11/19 0535 03/12/19 0400 03/13/19 0337  WBC 11.5* 10.4 11.3* 12.1* 10.3  NEUTROABS  --   --   --  10.4* 8.7*  HGB 14.9 14.8 15.7 15.1 14.6  HCT 46.0 46.3 48.3 46.1 45.3  MCV 94.5 94.5 94.7 93.5 95.6  PLT 215 186 158 163 [id number]*   Basic Metabolic Panel: Recent Labs  Lab 03/07/19 0250 03/09/19 0502 03/10/19 0310 03/11/19 0535 03/12/19 0400 03/13/19 0337  NA 143 139 139 137  138 139  K 4.0 4.1 4.1 4.4 4.1 4.0  CL 101 98 98 95* 97* 96*  CO2 29 29 31  33* 29 32  GLUCOSE 139* 152* 102* 113* 98 86  BUN 80* 60* 56* 49* 41* 36*  CREATININE 1.58* 1.13 1.12 1.09 0.87 0.98  CALCIUM 9.1 9.2 9.3 9.2 9.2 9.1  MG 2.9*  --   --   --  2.3 2.3  PHOS  --   --   --   --  3.1 3.7   GFR: Estimated Creatinine Clearance: 88.5 mL/min (by C-G formula based on SCr of 0.98 mg/dL). Liver Function Tests: Recent Labs  Lab 03/09/19 0502 03/10/19 0310 03/11/19 0535 03/12/19 0400 03/13/19 0337  AST 22 22 24 25 19   ALT 39 41 49* 50* 48*  ALKPHOS 89 83 86 80 73  BILITOT 1.4* 1.1 1.0 0.8 0.7  PROT 5.9* 6.1* 6.3* 6.0* 5.7*  ALBUMIN 3.2* 3.3* 3.5 3.1* 3.0*   No results for input(s): LIPASE, AMYLASE in the last 168 hours. No results for input(s): AMMONIA in the last 168 hours. Coagulation Profile: No results for input(s): INR, PROTIME in the last 168 hours. Cardiac Enzymes: No results for input(s): CKTOTAL, CKMB, CKMBINDEX, TROPONINI in the last 168 hours. BNP (last 3 results) No results for input(s): PROBNP in the last 8760 hours. HbA1C: No results for input(s): HGBA1C in the last 72 hours. CBG: Recent Labs  Lab 03/12/19 0749 03/12/19 1146 03/12/19 1649 03/12/19 2030 03/13/19 0730  GLUCAP 117* 213* 217* 191* 95   Lipid Profile: No results for input(s): CHOL, HDL, LDLCALC, TRIG, CHOLHDL, LDLDIRECT in the last 72 hours. Thyroid Function Tests: No results for input(s): TSH, T4TOTAL, FREET4, T3FREE, THYROIDAB in the last 72 hours. Anemia Panel: Recent Labs    03/12/19 0400 03/13/19 0337  FERRITIN 468* 399*   Urine analysis:    Component Value Date/Time   COLORURINE YELLOW 02/18/2019 1915   APPEARANCEUR CLEAR 02/03/2019 1915   LABSPEC >1.046 (H) 02/28/2019 1915   PHURINE 6.0 02/25/2019 1915   GLUCOSEU NEGATIVE 02/16/2019 1915   HGBUR NEGATIVE 02/17/2019 1915   [place] NEGATIVE 02/21/2019 [place] 02/26/2019 1915   PROTEINUR NEGATIVE  02/04/2019 1915   NITRITE NEGATIVE 02/01/2019 1915   LEUKOCYTESUR NEGATIVE 02/20/2019 1915   Sepsis Labs: @LABRCNTIP (procalcitonin:4,lacticidven:4)  ) Recent Results (from the past 240 hour(s))  Expectorated sputum assessment w rflx to resp cult     Status: None   Collection Time: 03/12/19  2:46 PM   Specimen: Sputum  Result Value Ref Range Status   Specimen Description SPU  Final   Special Requests NONE  Final   Sputum evaluation   Final    THIS SPECIMEN IS ACCEPTABLE FOR SPUTUM CULTURE  Performed at [facility], [place] [address], [place], [place] [address]    Report Status 03/12/2019 FINAL  Final  Culture, respiratory     Status: None (Preliminary result)   Collection Time: 03/12/19  2:46 PM   Specimen: Sputum  Result Value Ref Range Status   Specimen Description   Final    SPU Performed at [place] [address] [place], [place] [address]    Special Requests   Final    NONE Reflexed from [id number] Performed at [facility], [place] [address], [place], [place] [address]    Gram Stain   Final    MODERATE WBC PRESENT, PREDOMINANTLY PMN RARE GRAM POSITIVE COCCI Performed at [facility], [place] [address] [place], [place] [address]    Culture PENDING  Incomplete   Report Status PENDING  Incomplete         Radiology Studies: No results found.      Scheduled Meds: . aspirin EC  81 mg Oral Daily  . Chlorhexidine Gluconate Cloth  6 each Topical Daily  . enoxaparin (LOVENOX) injection  60 mg Subcutaneous Q24H  . fluticasone  2 spray Each Nare Daily  . guaiFENesin  1,200 mg Oral BID  . insulin aspart  0-20 Units Subcutaneous TID WC  . insulin aspart  6 Units Subcutaneous TID WC  . insulin detemir  12 Units Subcutaneous QHS  . insulin detemir  14 Units Subcutaneous Daily  . latanoprost  1 drop Both Eyes QHS  . loratadine  10 mg Oral Daily  . mouth rinse  15 mL Mouth Rinse BID  . methylPREDNISolone  (SOLU-MEDROL) injection  30 mg Intravenous Daily  . omega-3 acid ethyl esters  1 g Oral Daily  . pantoprazole  40 mg Oral Daily  . QUEtiapine  50 mg Oral QHS  . rosuvastatin  10 mg Oral Daily  . timolol  1 drop Left Eye BID  . umeclidinium-vilanterol  1 puff Inhalation Daily   Continuous Infusions:    LOS: 14 days   The patient is critically ill with multiple organ systems failure and requires high complexity decision making for assessment and support, frequent evaluation and titration of therapies, application of advanced monitoring technologies and extensive interpretation of multiple databases. Critical Care Time devoted to patient care services described in this note  Time spent: 40 minutes     Mackenzey Crownover, Geraldo Docker, MD [facility] Pager [phone]  If 7PM-7AM, please contact night-coverage [email or web address] Password [facility] 03/13/2019, 8:34 AM

## 2019-03-13 NOTE — Progress Notes (Signed)
Physical Therapy Treatment Patient Details Name: Justin Atkinson. MRN: [id number] DOB: March 16, 1941 Today's Date: 03/13/2019    History of Present Illness Pt is a 78 y.o. male recently admitted s/p VATS with lobe wedge resection (02/09/2019), now admitted 02/12/2019 with worsening cough and SOB; tested (+) COVID-19. Other PMH includes COPD/pulmonary fibrosis, cervical DDD, HTN, DM, HLD.   PT Comments    Pt progressing with mobility. Performed multiple bouts of standing activity, requiring seated rest breaks in between to allow for recovery of SpO2 (down to 70% on 16L HFNC + 15L NRB). Pt requires min-modA for standing balance, maxA for some ADL tasks. Pt with LOB with minimal external perturbations requiring assist to correct; demonstrates poor balance strategies/postural reactions; at high risk for falls. Pt remains highly motivated to participate in order to regain PLOF, has very supportive family and friends. Continue to recommend intensive CIR-level therapies to maximize functional mobility and independence; pt now agreeable to CIR.    Follow Up Recommendations  CIR;Supervision for mobility/OOB     Equipment Recommendations  Rolling walker with 5" wheels;3in1 (PT)    Recommendations for Other Services       Precautions / Restrictions Precautions Precautions: Fall;Other (comment) Precaution Comments: HFNC and 15L NRB, quick to desaturate Restrictions Weight Bearing Restrictions: No    Mobility  Bed Mobility               General bed mobility comments: Received sitting in recliner  Transfers Overall transfer level: Needs assistance Equipment used: None;Rolling walker (2 wheeled) Transfers: Sit to/from Stand Sit to Stand: Min assist;Mod assist         General transfer comment: Initial standing trials to RW, progressing to no DME, pt with heavy reliance on BUE support to push into standing, reliant on HHA and/or min-modA to prevent posterior LOB upon  standing  Ambulation/Gait Ambulation/Gait assistance: Mod assist;Min assist Gait Distance (Feet): 8 Feet Assistive device: Rolling walker (2 wheeled);1 person hand held assist Gait Pattern/deviations: Step-through pattern;Decreased stride length;Trunk flexed;Leaning posteriorly   Gait velocity interpretation: <1.31 ft/sec, indicative of household ambulator General Gait Details: Bout of marching in place with RW and minA for balance; 2x more bouts of walking 4 steps forwards/backwards, pt reliant on HHA and modA to maintain balance. Seated rest between activity bouts to recover SpO2   Stairs             Wheelchair Mobility    Modified Rankin (Stroke Patients Only)       Balance Overall balance assessment: Needs assistance   Sitting balance-Leahy Scale: Fair Sitting balance - Comments: Reliant on assist to don socks sitting edge of recliner     Standing balance-Leahy Scale: Poor Standing balance comment: Reliant on UE support to maintain balance with static standing. Small external perturbations given, pt reliant on UE support and external assist to prevent LOB, demonstrates poor ankle/hip balance strategies                            Cognition Arousal/Alertness: Awake/alert Behavior During Therapy: WFL for tasks assessed/performed Overall Cognitive Status: Within Functional Limits for tasks assessed                                        Exercises      General Comments General comments (skin integrity, edema, etc.): 16L HFNC + 15L NRB (w/ 2x  extensions) - SpO2 down to 70% with activity, requiring ~2 min seated rest to recover to 86-88%; quicker recovery time than previous PT session      Pertinent Vitals/Pain Pain Assessment: No/denies pain    Home Living                      Prior Function            PT Goals (current goals can now be found in the care plan section) Acute Rehab PT Goals Patient Stated Goal: Now  agreeable to CIR PT Goal Formulation: With patient Time For Goal Achievement: 03/27/19 Potential to Achieve Goals: Good Progress towards PT goals: Progressing toward goals    Frequency    Min 3X/week      PT Plan Current plan remains appropriate    Co-evaluation              AM-PAC PT "6 Clicks" Mobility   Outcome Measure  Help needed turning from your back to your side while in a flat bed without using bedrails?: A Little Help needed moving from lying on your back to sitting on the side of a flat bed without using bedrails?: A Little Help needed moving to and from a bed to a chair (including a wheelchair)?: A Little Help needed standing up from a chair using your arms (e.g., wheelchair or bedside chair)?: A Little Help needed to walk in hospital room?: A Little Help needed climbing 3-5 steps with a railing? : Total 6 Click Score: 16    End of Session Equipment Utilized During Treatment: Oxygen Activity Tolerance: Patient tolerated treatment well Patient left: in chair;with call bell/phone within reach Nurse Communication: Mobility status PT Visit Diagnosis: Muscle weakness (generalized) (M62.81);Difficulty in walking, not elsewhere classified (R26.2)     Time: [id number] PT Time Calculation (min) (ACUTE ONLY): 33 min  Charges:  $Therapeutic Exercise: 23-37 mins                    Mabeline Caras, PT, DPT Acute Rehabilitation Services  Pager [phone] Office [place] 03/13/2019, 5:16 PM

## 2019-03-14 LAB — COMPREHENSIVE METABOLIC PANEL
ALT: 61 U/L — ABNORMAL HIGH (ref 0–44)
AST: 24 U/L (ref 15–41)
Albumin: 3.1 g/dL — ABNORMAL LOW (ref 3.5–5.0)
Alkaline Phosphatase: 72 U/L (ref 38–126)
Anion gap: 9 (ref 5–15)
BUN: 35 mg/dL — ABNORMAL HIGH (ref 8–23)
CO2: 31 mmol/L (ref 22–32)
Calcium: 9.3 mg/dL (ref 8.9–10.3)
Chloride: 98 mmol/L (ref 98–111)
Creatinine, Ser: 0.93 mg/dL (ref 0.61–1.24)
GFR calc Af Amer: >60 mL/min (ref >60)
GFR calc non Af Amer: >60 mL/min (ref >60)
Glucose, Bld: 102 mg/dL — ABNORMAL HIGH (ref 70–99)
Potassium: 4.5 mmol/L (ref 3.5–5.1)
Sodium: 138 mmol/L (ref 135–145)
Total Bilirubin: 0.8 mg/dL (ref 0.3–1.2)
Total Protein: 5.9 g/dL — ABNORMAL LOW (ref 6.5–8.1)

## 2019-03-14 LAB — CBC WITH DIFFERENTIAL/PLATELET
Abs Immature Granulocytes: 0.05 10*3/uL (ref 0.00–0.07)
Basophils Absolute: 0 10*3/uL (ref 0.0–0.1)
Basophils Relative: 0 %
Eosinophils Absolute: 0.5 10*3/uL (ref 0.0–0.5)
Eosinophils Relative: 4 %
HCT: 44.1 % (ref 39.0–52.0)
Hemoglobin: 14.3 g/dL (ref 13.0–17.0)
Immature Granulocytes: 0 %
Lymphocytes Relative: 6 %
Lymphs Abs: 0.7 10*3/uL (ref 0.7–4.0)
MCH: 30.9 pg (ref 26.0–34.0)
MCHC: 32.4 g/dL (ref 30.0–36.0)
MCV: 95.2 fL (ref 80.0–100.0)
Monocytes Absolute: 0.5 10*3/uL (ref 0.1–1.0)
Monocytes Relative: 4 %
Neutro Abs: 10 10*3/uL — ABNORMAL HIGH (ref 1.7–7.7)
Neutrophils Relative %: 86 %
Platelets: 143 10*3/uL — ABNORMAL LOW (ref 150–400)
RBC: 4.63 MIL/uL (ref 4.22–5.81)
RDW: 13.7 % (ref 11.5–15.5)
WBC: 11.8 10*3/uL — ABNORMAL HIGH (ref 4.0–10.5)
nRBC: 0 % (ref 0.0–0.2)

## 2019-03-14 LAB — FERRITIN: Ferritin: 522 ng/mL — ABNORMAL HIGH (ref 24–336)

## 2019-03-14 LAB — GLUCOSE, CAPILLARY
Glucose-Capillary: 109 mg/dL — ABNORMAL HIGH (ref 70–99)
Glucose-Capillary: 170 mg/dL — ABNORMAL HIGH (ref 70–99)
Glucose-Capillary: 211 mg/dL — ABNORMAL HIGH (ref 70–99)
Glucose-Capillary: 173 mg/dL — ABNORMAL HIGH (ref 70–99)

## 2019-03-14 LAB — PHOSPHORUS: Phosphorus: 3.4 mg/dL (ref 2.5–4.6)

## 2019-03-14 LAB — MAGNESIUM: Magnesium: 2.3 mg/dL (ref 1.7–2.4)

## 2019-03-14 LAB — D-DIMER, QUANTITATIVE: D-Dimer, Quant: 1.63 ug/mL-FEU — ABNORMAL HIGH (ref 0.00–0.50)

## 2019-03-14 LAB — C-REACTIVE PROTEIN: CRP: 0.7 mg/dL (ref <1.0)

## 2019-03-14 NOTE — Plan of Care (Signed)
  Problem: Education: Goal: Knowledge of risk factors and measures for prevention of condition will improve Outcome: Progressing   Problem: Respiratory: Goal: Will maintain a patent airway Outcome: Progressing   

## 2019-03-14 NOTE — Progress Notes (Addendum)
PROGRESS NOTE    Justin Atkinson.  [id number] DOB: [birth year] DOA: 02/22/2019 PCP: Justin Infante, MD   Brief Narrative:  78 year old  WM PMHx COPD/pulmonary fibrosis recently diagnosed hypersensitivity pneumonitis (had VATS wedge resection on 11/9;follows with Dr. Vaughan Atkinson) who was recently started on steroid on 11/18, HTN, diabetes type 2 uncontrolled with complication, HLD, and DJD   Presented with increasing shortness of breath with nasal congestion, cough for about 10 days. Also noticed that his oxygen dropped to 89% on room air. Called the pulmonary office who asked him to come to the ED. In the ED he is hypoxicat80%, tachypneic and mildly hypertensive. CT angiogram of the chest was negative for PE and showed bilateral diffuse groundglass opacity. Admitted to ICU on high flow started on IV Solu-Medrol. COVID-19 test returned as positive. In the ED he had elevated inflammatory markers including LDH, ferritin, CRP, lactic acid and D-dimer. Patient remains on high flow oxygen. 03/01/2019: Remains 100% high flow nasal cannula oxygen.  Transferring to [facility] to continue treatment.  NOTE review of Dr. Vaughan Atkinson PCCM note 02/18/2019 patient was sent home on 2 L O2, pulmonary fibrosis, RIGHT VATS/11/9 performed showed hypersensitivity pneumonitis   Subjective: 12/12 afebrile last 24 hours, A/O x4 sitting in chair comfortably.  Positive S OB.  Better spirits now that physical rehab have spoken with him.     Assessment & Plan:   Principal Problem:   Acute respiratory failure with hypoxia (HCC) Active Problems:   Hypertension   Hypercholesterolemia   Benign prostate hyperplasia   ILD (interstitial lung disease) (HCC)   Postinflammatory pulmonary fibrosis (HCC)   HCAP (healthcare-associated pneumonia)   Chronic obstructive pulmonary disease ([place])   Pneumonia due to COVID-19 virus   Severe sepsis ([place])   Essential hypertension   Diabetes mellitus type 2,  controlled, with complications ([place])   Pulmonary fibrosis (HCC)   Hypersensitivity pneumonitis ([place])   Covid pneumonia/acute respiratory failure with hypoxia COVID-19 Labs  Recent Labs    03/12/19 0400 03/13/19 0337 03/14/19 0336  DDIMER 1.47* 1.41* 1.63*  FERRITIN 468* 399* 522*  CRP 0.6 0.6 0.7    Lab Results  Component Value Date   SARSCOV2NAA POSITIVE (A) 02/08/2019   SARSCOV2NAA NOT DETECTED 02/05/2019   SARSCOV2NAA NOT DETECTED 01/09/2019   -Solu-Medrol 30 mg daily  -Remdesivir per pharmacy protocol -11/29 Actemra x1 dose -11/30 Covid Convalescent Plasma x1  -Albuterol PRN -Flutter valve -Incentive spirometry -Titrate O2 to maintain SPO2> 88% -Prone patient 16 hours/day; if patient cannot tolerate prone at least 2 to 3 hours per shift -Xanax PRN anxiety have decreased dose -Morphine PRN air hunger have decreased dose  HCAP/severe sepsis -Trend procalcitonin Results for [name], [name] (MRN [id number]) as of 03/01/2019 18:22  Ref. Range 02/04/2019 11:47 02/28/2019 07:57 03/01/2019 03:33  Procalcitonin Latest Units: ng/mL 0.13 <0.10 <0.10  -Completed cefepime -12/10 sputum reintubated  Pulmonary fibrosis/Hypersensitivity Pneumonitis -Per Dr. Vaughan Atkinson PCCM note 11/9 worsening pulmonary fibrosis s  -11/9 RIGHT VATS; per Dr. Vaughan Atkinson PCCM note showed hypersensitivity pneumonitis  -See Covid pneumonia  Hypertension -BP medication discontinued patient's BP has been running on the low side. -12/10 improvement in patient's BP with decrease in sedating medication.  Still does not require BP medication.  HLD -Crestor 10 mg daily  Diabetes type 2 controlled with complication -123456 hemoglobin A1c= 7.0 -Resistant SSI -Levemir 14 units daily/12 units qhs -NovoLog 6 units qac -Resistant SSI  Insomnia -Discontinue trazodone -Patient on Seroquel 50 mg qhs   Daytime sleepiness -  Slowly decreasing sedating medication benzodiazepine, opiates, Haldol    DVT  prophylaxis: Lovenox  Code Status: Full Family Communication: 12/12 spoke with daughter and wife counseled him on plan of care answered all questions.  Justin Atkinson his wife request that we contact Justin Atkinson (daughter) as [place] [phone] Disposition Plan: TBD   Consultants:    Procedures/Significant Events:  11/30 Covid Convalescent Plasma x1    I have personally reviewed and interpreted all radiology studies and my findings are as above.  VENTILATOR SETTINGS: HFNC + NRB 12/12 Flow rate; 15 L/min FiO2; 100% SPO2; 93%   Cultures 11/27 SARS coronavirus positive 11/29 influenza A/B negative 12/10 sputum reincubated for better growth    Antimicrobials: Anti-infectives (From admission, onward)   Start     Dose/Rate Stop   03/01/19 0800  remdesivir 100 mg in sodium chloride 0.9 % 250 mL IVPB     100 mg 500 mL/hr over 30 Minutes 03/04/19 1216   02/28/19 1000  remdesivir 200 mg in sodium chloride 0.9 % 250 mL IVPB     200 mg 500 mL/hr over 30 Minutes 02/28/19 1006   02/28/19 0000  vancomycin (VANCOCIN) 1,250 mg in sodium chloride 0.9 % 250 mL IVPB  Status:  Discontinued     1,250 mg 166.7 mL/hr over 90 Minutes 02/22/2019 2037   02/14/2019 2200  ceFEPIme (MAXIPIME) 2 g in sodium chloride 0.9 % 100 mL IVPB  Status:  Discontinued     2 g 200 mL/hr over 30 Minutes 03/03/19 1242   02/21/2019 1315  ceFEPIme (MAXIPIME) 2 g in sodium chloride 0.9 % 100 mL IVPB     2 g 200 mL/hr over 30 Minutes 02/28/19 0324   02/15/2019 1315  vancomycin (VANCOCIN) 2,000 mg in sodium chloride 0.9 % 500 mL IVPB     2,000 mg 250 mL/hr over 120 Minutes 02/28/19 0324       Devices    LINES / TUBES:      Continuous Infusions:    Objective: Vitals:   03/14/19 0446 03/14/19 0700 03/14/19 0900 03/14/19 1206  BP: 128/74 124/69  113/70  Pulse: 65 69  66  Resp: 19 (!) 25  18  Temp: (!) 97.4 F (36.3 C) 97.8 F (36.6 C)  98.4 F (36.9 C)  TempSrc: Axillary Axillary  Oral  SpO2: 92% 91% 93% 93%    Weight:      Height:        Intake/Output Summary (Last 24 hours) at 03/14/2019 1658 Last data filed at 03/14/2019 1600 Gross per 24 hour  Intake 960 ml  Output 1250 ml  Net -290 ml   Filed Weights   02/11/2019 1810 02/28/19 0812 03/06/19 1607  Weight: 130.7 kg 130.7 kg 124.2 kg   Physical Exam:  General: A/O x4, positive acute respiratory distress Eyes: negative scleral hemorrhage, negative anisocoria, negative icterus ENT: Negative Runny nose, negative gingival bleeding, Neck:  Negative scars, masses, torticollis, lymphadenopathy, JVD Lungs: Positive mild rhonchi, positive bibasilar crackles  Cardiovascular: Regular rate and rhythm without murmur gallop or rub normal S1 and S2 Abdomen: negative abdominal pain, nondistended, positive soft, bowel sounds, no rebound, no ascites, no appreciable mass Extremities: No significant cyanosis, clubbing, or edema bilateral lower extremities Skin: Negative rashes, lesions, ulcers Psychiatric:  Negative depression, negative anxiety, negative fatigue, negative mania  Central nervous system:  Cranial nerves II through XII intact, tongue/uvula midline, all extremities muscle strength 5/5, sensation intact throughout, negative dysarthria, negative expressive aphasia, negative receptive aphasia.  Data Reviewed: Care during the described time interval was provided by me .  I have reviewed this patient's available data, including medical history, events of note, physical examination, and all test results as part of my evaluation.   CBC: Recent Labs  Lab 03/10/19 0310 03/11/19 0535 03/12/19 0400 03/13/19 0337 03/14/19 0336  WBC 10.4 11.3* 12.1* 10.3 11.8*  NEUTROABS  --   --  10.4* 8.7* 10.0*  HGB 14.8 15.7 15.1 14.6 14.3  HCT 46.3 48.3 46.1 45.3 44.1  MCV 94.5 94.7 93.5 95.6 95.2  PLT 186 158 163 146* [id number]*   Basic Metabolic Panel: Recent Labs  Lab 03/10/19 0310 03/11/19 0535 03/12/19 0400 03/13/19 0337 03/14/19 0336  NA  139 137 138 139 138  K 4.1 4.4 4.1 4.0 4.5  CL 98 95* 97* 96* 98  CO2 31 33* 29 32 31  GLUCOSE 102* 113* 98 86 102*  BUN 56* 49* 41* 36* 35*  CREATININE 1.12 1.09 0.87 0.98 0.93  CALCIUM 9.3 9.2 9.2 9.1 9.3  MG  --   --  2.3 2.3 2.3  PHOS  --   --  3.1 3.7 3.4   GFR: Estimated Creatinine Clearance: 93.3 mL/min (by C-G formula based on SCr of 0.93 mg/dL). Liver Function Tests: Recent Labs  Lab 03/10/19 0310 03/11/19 0535 03/12/19 0400 03/13/19 0337 03/14/19 0336  AST 22 24 25 19 24   ALT 41 49* 50* 48* 61*  ALKPHOS 83 86 80 73 72  BILITOT 1.1 1.0 0.8 0.7 0.8  PROT 6.1* 6.3* 6.0* 5.7* 5.9*  ALBUMIN 3.3* 3.5 3.1* 3.0* 3.1*   No results for input(s): LIPASE, AMYLASE in the last 168 hours. No results for input(s): AMMONIA in the last 168 hours. Coagulation Profile: No results for input(s): INR, PROTIME in the last 168 hours. Cardiac Enzymes: No results for input(s): CKTOTAL, CKMB, CKMBINDEX, TROPONINI in the last 168 hours. BNP (last 3 results) No results for input(s): PROBNP in the last 8760 hours. HbA1C: No results for input(s): HGBA1C in the last 72 hours. CBG: Recent Labs  Lab 03/13/19 1630 03/13/19 2059 03/14/19 0757 03/14/19 1204 03/14/19 1635  GLUCAP 166* 232* 109* 170* 211*   Lipid Profile: No results for input(s): CHOL, HDL, LDLCALC, TRIG, CHOLHDL, LDLDIRECT in the last 72 hours. Thyroid Function Tests: No results for input(s): TSH, T4TOTAL, FREET4, T3FREE, THYROIDAB in the last 72 hours. Anemia Panel: Recent Labs    03/13/19 0337 03/14/19 0336  FERRITIN 399* 522*   Urine analysis:    Component Value Date/Time   COLORURINE YELLOW 02/03/2019 1915   APPEARANCEUR CLEAR 02/08/2019 1915   LABSPEC >1.046 (H) 02/18/2019 1915   PHURINE 6.0 02/06/2019 1915   GLUCOSEU NEGATIVE 02/07/2019 1915   HGBUR NEGATIVE 02/26/2019 1915   [place] NEGATIVE 02/10/2019 [place] NEGATIVE 02/24/2019 1915   PROTEINUR NEGATIVE 02/28/2019 1915   NITRITE  NEGATIVE 02/10/2019 1915   LEUKOCYTESUR NEGATIVE 02/04/2019 1915   Sepsis Labs: @LABRCNTIP (procalcitonin:4,lacticidven:4)  ) Recent Results (from the past 240 hour(s))  Expectorated sputum assessment w rflx to resp cult     Status: None   Collection Time: 03/12/19  2:46 PM   Specimen: Sputum  Result Value Ref Range Status   Specimen Description SPU  Final   Special Requests NONE  Final   Sputum evaluation   Final    THIS SPECIMEN IS ACCEPTABLE FOR SPUTUM CULTURE Performed at [facility], [place] [address] [place], [place] [address]    Report Status 03/12/2019 FINAL  Final  Culture, respiratory     Status: None (Preliminary result)   Collection Time: 03/12/19  2:46 PM   Specimen: Sputum  Result Value Ref Range Status   Specimen Description   Final    SPU Performed at [facility] [address] [place], [place] [address]    Special Requests   Final    NONE Reflexed from [id number] Performed at [facility], [place] [address], [place], [place] [address]    Gram Stain   Final    MODERATE WBC PRESENT, PREDOMINANTLY PMN RARE GRAM POSITIVE COCCI    Culture   Final    CULTURE REINCUBATED FOR BETTER GROWTH Performed at [facility], [place] [address], [place], [place] [address]    Report Status PENDING  Incomplete         Radiology Studies: No results found.      Scheduled Meds: . aspirin EC  81 mg Oral Daily  . Chlorhexidine Gluconate Cloth  6 each Topical Daily  . enoxaparin (LOVENOX) injection  60 mg Subcutaneous Q24H  . fluticasone  2 spray Each Nare Daily  . guaiFENesin  1,200 mg Oral BID  . insulin aspart  0-20 Units Subcutaneous TID WC  . insulin aspart  6 Units Subcutaneous TID WC  . insulin detemir  12 Units Subcutaneous QHS  . insulin detemir  14 Units Subcutaneous Daily  . latanoprost  1 drop Both Eyes QHS  . loratadine  10 mg Oral Daily  . mouth rinse  15 mL Mouth Rinse BID  .  methylPREDNISolone (SOLU-MEDROL) injection  30 mg Intravenous Daily  . omega-3 acid ethyl esters  1 g Oral Daily  . pantoprazole  40 mg Oral Daily  . QUEtiapine  50 mg Oral QHS  . rosuvastatin  10 mg Oral Daily  . timolol  1 drop Left Eye BID  . umeclidinium-vilanterol  1 puff Inhalation Daily   Continuous Infusions:    LOS: 15 days   The patient is critically ill with multiple organ systems failure and requires high complexity decision making for assessment and support, frequent evaluation and titration of therapies, application of advanced monitoring technologies and extensive interpretation of multiple databases. Critical Care Time devoted to patient care services described in this note  Time spent: 40 minutes     Chancie Lampert, Geraldo Docker, MD [facility] Pager [phone]  If 7PM-7AM, please contact night-coverage [email or web address] Password [facility] 03/14/2019, 4:58 PM

## 2019-03-14 NOTE — Progress Notes (Signed)
Inpatient Rehabilitation Admissions Coordinator  Covid positive 11/27. To be considered for an inpt rehab admit he would have to meet post Covid guidelines of >20 days since positive with improvement/recovery of symptoms and would not have to be retested or 2 negative COVID tests if <20 days since positive. Currently requiring HF [place] and NRB with activity . I will continue to follow for tolerance for more intense therapies as well as Post COVID guidelines. Please call me with any questions.  Danne Baxter, RN, MSN Rehab Admissions Coordinator [phone] 03/14/2019 10:07 AM

## 2019-03-15 LAB — CBC WITH DIFFERENTIAL/PLATELET
Abs Immature Granulocytes: 0.05 10*3/uL (ref 0.00–0.07)
Basophils Absolute: 0 10*3/uL (ref 0.0–0.1)
Basophils Relative: 0 %
Eosinophils Absolute: 0.5 10*3/uL (ref 0.0–0.5)
Eosinophils Relative: 4 %
HCT: 43.5 % (ref 39.0–52.0)
Hemoglobin: 13.9 g/dL (ref 13.0–17.0)
Immature Granulocytes: 1 %
Lymphocytes Relative: 7 %
Lymphs Abs: 0.7 10*3/uL (ref 0.7–4.0)
MCH: 30.8 pg (ref 26.0–34.0)
MCHC: 32 g/dL (ref 30.0–36.0)
MCV: 96.5 fL (ref 80.0–100.0)
Monocytes Absolute: 0.4 10*3/uL (ref 0.1–1.0)
Monocytes Relative: 4 %
Neutro Abs: 8.7 10*3/uL — ABNORMAL HIGH (ref 1.7–7.7)
Neutrophils Relative %: 84 %
Platelets: 141 10*3/uL — ABNORMAL LOW (ref 150–400)
RBC: 4.51 MIL/uL (ref 4.22–5.81)
RDW: 13.8 % (ref 11.5–15.5)
WBC: 10.4 10*3/uL (ref 4.0–10.5)
nRBC: 0 % (ref 0.0–0.2)

## 2019-03-15 LAB — CULTURE, RESPIRATORY W GRAM STAIN: Culture: NORMAL

## 2019-03-15 LAB — COMPREHENSIVE METABOLIC PANEL
ALT: 50 U/L — ABNORMAL HIGH (ref 0–44)
AST: 23 U/L (ref 15–41)
Albumin: 3.2 g/dL — ABNORMAL LOW (ref 3.5–5.0)
Alkaline Phosphatase: 69 U/L (ref 38–126)
Anion gap: 9 (ref 5–15)
BUN: 32 mg/dL — ABNORMAL HIGH (ref 8–23)
CO2: 32 mmol/L (ref 22–32)
Calcium: 9 mg/dL (ref 8.9–10.3)
Chloride: 97 mmol/L — ABNORMAL LOW (ref 98–111)
Creatinine, Ser: 0.95 mg/dL (ref 0.61–1.24)
GFR calc Af Amer: >60 mL/min (ref >60)
GFR calc non Af Amer: >60 mL/min (ref >60)
Glucose, Bld: 95 mg/dL (ref 70–99)
Potassium: 4.5 mmol/L (ref 3.5–5.1)
Sodium: 138 mmol/L (ref 135–145)
Total Bilirubin: 0.7 mg/dL (ref 0.3–1.2)
Total Protein: 5.9 g/dL — ABNORMAL LOW (ref 6.5–8.1)

## 2019-03-15 LAB — PHOSPHORUS: Phosphorus: 3.5 mg/dL (ref 2.5–4.6)

## 2019-03-15 LAB — GLUCOSE, CAPILLARY
Glucose-Capillary: 89 mg/dL (ref 70–99)
Glucose-Capillary: 194 mg/dL — ABNORMAL HIGH (ref 70–99)
Glucose-Capillary: 229 mg/dL — ABNORMAL HIGH (ref 70–99)
Glucose-Capillary: 201 mg/dL — ABNORMAL HIGH (ref 70–99)

## 2019-03-15 LAB — FERRITIN: Ferritin: 407 ng/mL — ABNORMAL HIGH (ref 24–336)

## 2019-03-15 LAB — D-DIMER, QUANTITATIVE: D-Dimer, Quant: 1.93 ug/mL-FEU — ABNORMAL HIGH (ref 0.00–0.50)

## 2019-03-15 LAB — C-REACTIVE PROTEIN: CRP: 0.5 mg/dL (ref <1.0)

## 2019-03-15 LAB — MAGNESIUM: Magnesium: 2.3 mg/dL (ref 1.7–2.4)

## 2019-03-15 NOTE — Progress Notes (Signed)
PROGRESS NOTE    Justin Atkinson.  [id number] DOB: 07/23/1940 DOA: 02/14/2019 PCP: Justin Infante, MD   Brief Narrative:  78 year old  WM PMHx COPD/pulmonary fibrosis recently diagnosed hypersensitivity pneumonitis (had VATS wedge resection on 11/9;follows with Dr. Vaughan Atkinson) who was recently started on steroid on 11/18, HTN, diabetes type 2 uncontrolled with complication, HLD, and DJD   Presented with increasing shortness of breath with nasal congestion, cough for about 10 days. Also noticed that his oxygen dropped to 89% on room air. Called the pulmonary office who asked him to come to the ED. In the ED he is hypoxicat80%, tachypneic and mildly hypertensive. CT angiogram of the chest was negative for PE and showed bilateral diffuse groundglass opacity. Admitted to ICU on high flow started on IV Solu-Medrol. COVID-19 test returned as positive. In the ED he had elevated inflammatory markers including LDH, ferritin, CRP, lactic acid and D-dimer. Patient remains on high flow oxygen. 03/01/2019: Remains 100% high flow nasal cannula oxygen.  Transferring to [facility] to continue treatment.  NOTE review of Dr. Vaughan Atkinson PCCM note 02/18/2019 patient was sent home on 2 L O2, pulmonary fibrosis, RIGHT VATS/11/9 performed showed hypersensitivity pneumonitis   Subjective: 12/13 afebrile last 24 hours A/O x4, positive S OB but sitting in chair comfortably.  States ambulated around room with assistance yesterday   Assessment & Plan:   Principal Problem:   Acute respiratory failure with hypoxia ([place]) Active Problems:   Hypertension   Hypercholesterolemia   Benign prostate hyperplasia   ILD (interstitial lung disease) (HCC)   Postinflammatory pulmonary fibrosis (HCC)   HCAP (healthcare-associated pneumonia)   Chronic obstructive pulmonary disease ([place])   Pneumonia due to COVID-19 virus   Severe sepsis ([place])   Essential hypertension   Diabetes mellitus type 2, controlled, with  complications ([place])   Pulmonary fibrosis (HCC)   Hypersensitivity pneumonitis ([place])   Covid pneumonia/acute respiratory failure with hypoxia COVID-19 Labs  Recent Labs    03/13/19 0337 03/14/19 0336 03/15/19 0059  DDIMER 1.41* 1.63* 1.93*  FERRITIN 399* 522* 407*  CRP 0.6 0.7 0.5    Lab Results  Component Value Date   SARSCOV2NAA POSITIVE (A) 02/16/2019   SARSCOV2NAA NOT DETECTED 02/05/2019   SARSCOV2NAA NOT DETECTED 01/09/2019   -Solu-Medrol 30 mg daily  -Remdesivir per pharmacy protocol -11/29 Actemra x1 dose -11/30 Covid Convalescent Plasma x1  -Albuterol PRN -Flutter valve -Incentive spirometry -Titrate O2 to maintain SPO2> 88% -Prone patient 16 hours/day; if patient cannot tolerate prone at least 2 to 3 hours per shift -Xanax PRN anxiety have decreased dose -Morphine PRN air hunger have decreased dose  HCAP/severe sepsis -Trend procalcitonin Results for Justin, Atkinson (MRN [id number]) as of 03/01/2019 18:22  Ref. Range 02/02/2019 11:47 02/28/2019 07:57 03/01/2019 03:33  Procalcitonin Latest Units: ng/mL 0.13 <0.10 <0.10  -Completed cefepime -12/10 sputum; normal respiratory flora  Pulmonary fibrosis/Hypersensitivity Pneumonitis -Per Dr. Vaughan Atkinson PCCM note 11/9 worsening pulmonary fibrosis s  -11/9 RIGHT VATS; per Dr. Vaughan Atkinson PCCM note showed hypersensitivity pneumonitis  -See Covid pneumonia  Hypertension -BP medication discontinued patient's BP has been running on the low side. -12/10 improvement in patient's BP with decrease in sedating medication.  Still does not require BP medication.  HLD -Crestor 10 mg daily  Diabetes type 2 controlled with complication -123456 hemoglobin A1c= 7.0 -Resistant SSI -Levemir 14 units daily/12 units qhs -NovoLog 6 units qac -Resistant SSI  Insomnia -Discontinue trazodone -Patient on Seroquel 50 mg qhs   Daytime sleepiness -Slowly decreasing sedating  medication benzodiazepine, opiates, Haldol    DVT  prophylaxis: Lovenox  Code Status: Full Family Communication: 12/12 spoke with daughter and wife counseled him on plan of care answered all questions.  Justin Atkinson his wife request that we contact Justin Atkinson (daughter) as [place] [phone] Disposition Plan: TBD   Consultants:    Procedures/Significant Events:  11/30 Covid Convalescent Plasma x1    I have personally reviewed and interpreted all radiology studies and my findings are as above.  VENTILATOR SETTINGS: HFNC + NRB 12/13 Flow rate; 15 L/min FiO2; 100% SPO2; 92%   Cultures 11/27 SARS coronavirus positive 11/29 influenza A/B negative 12/10 sputum normal respiratory flora    Antimicrobials: Anti-infectives (From admission, onward)   Start     Dose/Rate Stop   03/01/19 0800  remdesivir 100 mg in sodium chloride 0.9 % 250 mL IVPB     100 mg 500 mL/hr over 30 Minutes 03/04/19 1216   02/28/19 1000  remdesivir 200 mg in sodium chloride 0.9 % 250 mL IVPB     200 mg 500 mL/hr over 30 Minutes 02/28/19 1006   02/28/19 0000  vancomycin (VANCOCIN) 1,250 mg in sodium chloride 0.9 % 250 mL IVPB  Status:  Discontinued     1,250 mg 166.7 mL/hr over 90 Minutes 02/03/2019 2037   02/11/2019 2200  ceFEPIme (MAXIPIME) 2 g in sodium chloride 0.9 % 100 mL IVPB  Status:  Discontinued     2 g 200 mL/hr over 30 Minutes 03/03/19 1242   02/14/2019 1315  ceFEPIme (MAXIPIME) 2 g in sodium chloride 0.9 % 100 mL IVPB     2 g 200 mL/hr over 30 Minutes 02/28/19 0324   02/03/2019 1315  vancomycin (VANCOCIN) 2,000 mg in sodium chloride 0.9 % 500 mL IVPB     2,000 mg 250 mL/hr over 120 Minutes 02/28/19 0324       Devices    LINES / TUBES:      Continuous Infusions:    Objective: Vitals:   03/14/19 2338 03/15/19 0328 03/15/19 0331 03/15/19 0800  BP:   128/70 119/76  Pulse:  63 (!) 58 66  Resp:  20 17 20   Temp: 98.1 F (36.7 C)  98.5 F (36.9 C) 97.9 F (36.6 C)  TempSrc: Oral  Oral Axillary  SpO2:  91% (!) 85% 92%  Weight:      Height:         Intake/Output Summary (Last 24 hours) at 03/15/2019 0846 Last data filed at 03/15/2019 0800 Gross per 24 hour  Intake 1920 ml  Output 1700 ml  Net 220 ml   Filed Weights   02/14/2019 1810 02/28/19 0812 03/06/19 1607  Weight: 130.7 kg 130.7 kg 124.2 kg   Physical Exam:  General: A/O x4, positive acute respiratory distress Eyes: negative scleral hemorrhage, negative anisocoria, negative icterus ENT: Negative Runny nose, negative gingival bleeding, Neck:  Negative scars, masses, torticollis, lymphadenopathy, JVD Lungs: Bibasilar crackles without wheezes or crackles Cardiovascular: Regular rate and rhythm without murmur gallop or rub normal S1 and S2 Abdomen: negative abdominal pain, nondistended, positive soft, bowel sounds, no rebound, no ascites, no appreciable mass Extremities: No significant cyanosis, clubbing, or edema bilateral lower extremities Skin: Negative rashes, lesions, ulcers Psychiatric:  Negative depression, negative anxiety, negative fatigue, negative mania  Central nervous system:  Cranial nerves II through XII intact, tongue/uvula midline, all extremities muscle strength 5/5, sensation intact throughout, negative dysarthria, negative expressive aphasia, negative receptive aphasia.      Data Reviewed: Care during the described time  interval was provided by me .  I have reviewed this patient's available data, including medical history, events of note, physical examination, and all test results as part of my evaluation.   CBC: Recent Labs  Lab 03/11/19 0535 03/12/19 0400 03/13/19 0337 03/14/19 0336 03/15/19 0059  WBC 11.3* 12.1* 10.3 11.8* 10.4  NEUTROABS  --  10.4* 8.7* 10.0* 8.7*  HGB 15.7 15.1 14.6 14.3 13.9  HCT 48.3 46.1 45.3 44.1 43.5  MCV 94.7 93.5 95.6 95.2 96.5  PLT 158 163 146* 143* [id number]*   Basic Metabolic Panel: Recent Labs  Lab 03/11/19 0535 03/12/19 0400 03/13/19 0337 03/14/19 0336 03/15/19 0059  NA 137 138 139 138 138  K 4.4 4.1  4.0 4.5 4.5  CL 95* 97* 96* 98 97*  CO2 33* 29 32 31 32  GLUCOSE 113* 98 86 102* 95  BUN 49* 41* 36* 35* 32*  CREATININE 1.09 0.87 0.98 0.93 0.95  CALCIUM 9.2 9.2 9.1 9.3 9.0  MG  --  2.3 2.3 2.3 2.3  PHOS  --  3.1 3.7 3.4 3.5   GFR: Estimated Creatinine Clearance: 91.3 mL/min (by C-G formula based on SCr of 0.95 mg/dL). Liver Function Tests: Recent Labs  Lab 03/11/19 0535 03/12/19 0400 03/13/19 0337 03/14/19 0336 03/15/19 0059  AST 24 25 19 24 23   ALT 49* 50* 48* 61* 50*  ALKPHOS 86 80 73 72 69  BILITOT 1.0 0.8 0.7 0.8 0.7  PROT 6.3* 6.0* 5.7* 5.9* 5.9*  ALBUMIN 3.5 3.1* 3.0* 3.1* 3.2*   No results for input(s): LIPASE, AMYLASE in the last 168 hours. No results for input(s): AMMONIA in the last 168 hours. Coagulation Profile: No results for input(s): INR, PROTIME in the last 168 hours. Cardiac Enzymes: No results for input(s): CKTOTAL, CKMB, CKMBINDEX, TROPONINI in the last 168 hours. BNP (last 3 results) No results for input(s): PROBNP in the last 8760 hours. HbA1C: No results for input(s): HGBA1C in the last 72 hours. CBG: Recent Labs  Lab 03/14/19 0757 03/14/19 1204 03/14/19 1635 03/14/19 2042 03/15/19 0812  GLUCAP 109* 170* 211* 173* 89   Lipid Profile: No results for input(s): CHOL, HDL, LDLCALC, TRIG, CHOLHDL, LDLDIRECT in the last 72 hours. Thyroid Function Tests: No results for input(s): TSH, T4TOTAL, FREET4, T3FREE, THYROIDAB in the last 72 hours. Anemia Panel: Recent Labs    03/14/19 0336 03/15/19 0059  FERRITIN 522* 407*   Urine analysis:    Component Value Date/Time   COLORURINE YELLOW 02/23/2019 1915   APPEARANCEUR CLEAR 02/13/2019 1915   LABSPEC >1.046 (H) 02/10/2019 1915   PHURINE 6.0 02/03/2019 1915   GLUCOSEU NEGATIVE 02/06/2019 1915   HGBUR NEGATIVE 02/07/2019 1915   [place] NEGATIVE 02/03/2019 [place] NEGATIVE 02/01/2019 1915   PROTEINUR NEGATIVE 02/09/2019 1915   NITRITE NEGATIVE 02/16/2019 1915   LEUKOCYTESUR  NEGATIVE 02/10/2019 1915   Sepsis Labs: @LABRCNTIP (procalcitonin:4,lacticidven:4)  ) Recent Results (from the past 240 hour(s))  Expectorated sputum assessment w rflx to resp cult     Status: None   Collection Time: 03/12/19  2:46 PM   Specimen: Sputum  Result Value Ref Range Status   Specimen Description SPU  Final   Special Requests NONE  Final   Sputum evaluation   Final    THIS SPECIMEN IS ACCEPTABLE FOR SPUTUM CULTURE Performed at [facility], [place] [address], [place], [place] [address]    Report Status 03/12/2019 FINAL  Final  Culture, respiratory     Status: None (Preliminary result)  Collection Time: 03/12/19  2:46 PM   Specimen: Sputum  Result Value Ref Range Status   Specimen Description   Final    SPU Performed at [facility] [address], [place], [place] [address]    Special Requests   Final    NONE Reflexed from [id number] Performed at [facility], [place] [address], [place], [place] [address]    Gram Stain   Final    MODERATE WBC PRESENT, PREDOMINANTLY PMN RARE GRAM POSITIVE COCCI    Culture   Final    CULTURE REINCUBATED FOR BETTER GROWTH Performed at [facility], [place] [address], [place],  [address]    Report Status PENDING  Incomplete         Radiology Studies: No results found.      Scheduled Meds: . aspirin EC  81 mg Oral Daily  . Chlorhexidine Gluconate Cloth  6 each Topical Daily  . enoxaparin (LOVENOX) injection  60 mg Subcutaneous Q24H  . fluticasone  2 spray Each Nare Daily  . guaiFENesin  1,200 mg Oral BID  . insulin aspart  0-20 Units Subcutaneous TID WC  . insulin aspart  6 Units Subcutaneous TID WC  . insulin detemir  12 Units Subcutaneous QHS  . insulin detemir  14 Units Subcutaneous Daily  . latanoprost  1 drop Both Eyes QHS  . loratadine  10 mg Oral Daily  . mouth rinse  15 mL Mouth Rinse BID  . methylPREDNISolone (SOLU-MEDROL) injection  30 mg  Intravenous Daily  . omega-3 acid ethyl esters  1 g Oral Daily  . pantoprazole  40 mg Oral Daily  . QUEtiapine  50 mg Oral QHS  . rosuvastatin  10 mg Oral Daily  . timolol  1 drop Left Eye BID  . umeclidinium-vilanterol  1 puff Inhalation Daily   Continuous Infusions:    LOS: 16 days   The patient is critically ill with multiple organ systems failure and requires high complexity decision making for assessment and support, frequent evaluation and titration of therapies, application of advanced monitoring technologies and extensive interpretation of multiple databases. Critical Care Time devoted to patient care services described in this note  Time spent: 40 minutes     Taneeka Curtner, Geraldo Docker, MD [facility] Pager [phone]  If 7PM-7AM, please contact night-coverage [email or web address] Password [facility] 03/15/2019, 8:46 AM

## 2019-03-16 LAB — CBC WITH DIFFERENTIAL/PLATELET
Abs Immature Granulocytes: 0.03 10*3/uL (ref 0.00–0.07)
Basophils Absolute: 0 10*3/uL (ref 0.0–0.1)
Basophils Relative: 1 %
Eosinophils Absolute: 0.6 10*3/uL — ABNORMAL HIGH (ref 0.0–0.5)
Eosinophils Relative: 7 %
HCT: 42.3 % (ref 39.0–52.0)
Hemoglobin: 13.5 g/dL (ref 13.0–17.0)
Immature Granulocytes: 0 %
Lymphocytes Relative: 10 %
Lymphs Abs: 0.8 10*3/uL (ref 0.7–4.0)
MCH: 30.4 pg (ref 26.0–34.0)
MCHC: 31.9 g/dL (ref 30.0–36.0)
MCV: 95.3 fL (ref 80.0–100.0)
Monocytes Absolute: 0.4 10*3/uL (ref 0.1–1.0)
Monocytes Relative: 5 %
Neutro Abs: 6.8 10*3/uL (ref 1.7–7.7)
Neutrophils Relative %: 77 %
Platelets: 110 10*3/uL — ABNORMAL LOW (ref 150–400)
RBC: 4.44 MIL/uL (ref 4.22–5.81)
RDW: 13.8 % (ref 11.5–15.5)
WBC: 8.7 10*3/uL (ref 4.0–10.5)
nRBC: 0 % (ref 0.0–0.2)

## 2019-03-16 LAB — COMPREHENSIVE METABOLIC PANEL
ALT: 52 U/L — ABNORMAL HIGH (ref 0–44)
AST: 23 U/L (ref 15–41)
Albumin: 3.1 g/dL — ABNORMAL LOW (ref 3.5–5.0)
Alkaline Phosphatase: 60 U/L (ref 38–126)
Anion gap: 9 (ref 5–15)
BUN: 31 mg/dL — ABNORMAL HIGH (ref 8–23)
CO2: 30 mmol/L (ref 22–32)
Calcium: 8.8 mg/dL — ABNORMAL LOW (ref 8.9–10.3)
Chloride: 100 mmol/L (ref 98–111)
Creatinine, Ser: 1 mg/dL (ref 0.61–1.24)
GFR calc Af Amer: >60 mL/min (ref >60)
GFR calc non Af Amer: >60 mL/min (ref >60)
Glucose, Bld: 91 mg/dL (ref 70–99)
Potassium: 4.6 mmol/L (ref 3.5–5.1)
Sodium: 139 mmol/L (ref 135–145)
Total Bilirubin: 0.7 mg/dL (ref 0.3–1.2)
Total Protein: 5.6 g/dL — ABNORMAL LOW (ref 6.5–8.1)

## 2019-03-16 LAB — GLUCOSE, CAPILLARY
Glucose-Capillary: 101 mg/dL — ABNORMAL HIGH (ref 70–99)
Glucose-Capillary: 128 mg/dL — ABNORMAL HIGH (ref 70–99)
Glucose-Capillary: 207 mg/dL — ABNORMAL HIGH (ref 70–99)
Glucose-Capillary: 194 mg/dL — ABNORMAL HIGH (ref 70–99)

## 2019-03-16 LAB — D-DIMER, QUANTITATIVE: D-Dimer, Quant: 2 ug/mL-FEU — ABNORMAL HIGH (ref 0.00–0.50)

## 2019-03-16 LAB — C-REACTIVE PROTEIN: CRP: 0.6 mg/dL (ref <1.0)

## 2019-03-16 LAB — FERRITIN: Ferritin: 393 ng/mL — ABNORMAL HIGH (ref 24–336)

## 2019-03-16 LAB — PHOSPHORUS: Phosphorus: 3.5 mg/dL (ref 2.5–4.6)

## 2019-03-16 LAB — MAGNESIUM: Magnesium: 2.3 mg/dL (ref 1.7–2.4)

## 2019-03-16 MED ORDER — METHYLPREDNISOLONE SODIUM SUCC 125 MG IJ SOLR
60.0000 mg | Freq: Two times a day (BID) | INTRAMUSCULAR | Status: DC
  Administered 2019-03-16 – 2019-03-24 (×17): 60 mg via INTRAVENOUS
  Filled 2019-03-16 (×17): qty 2

## 2019-03-16 MED ORDER — INSULIN ASPART 100 UNIT/ML ~~LOC~~ SOLN
10.0000 [IU] | Freq: Three times a day (TID) | SUBCUTANEOUS | Status: DC
  Administered 2019-03-16 – 2019-03-23 (×20): 10 [IU] via SUBCUTANEOUS

## 2019-03-16 NOTE — Consult Note (Signed)
   [facility] CM Inpatient Consult   03/16/2019  Tyanthony Sathre. 08-Oct-1940 [id number]    Follow-Up Note:   Following this Medicare/ [employer] patient who transferred out of ICU to [place]- 03/09/19 to continue treatment. (Covid pneumonia/acute respiratory failure with hypoxia)  Primary care provider is Dr. [name] with [facility], listed to provide transition of care.  Brief chartreviewrevealsPT/ OTnotescurrently recommending patientfor CIR([facility]) at discharge and patient is agreeable to it.  Plan: Will followforprogress anddisposition, and ifthere are any changesin dispositionand needs for appropriate community follow-up,please referpatientto [facility] caremanagement.   Of note, [facility] Care Management services does not replace or interfere with any servicesarranged by transition of care CM or social work.   Forquestions and additional information, pleasecontact:  Tyrea Froberg A. Shakthi Scipio, BSN, RN-BC [facility] Liaison Cell: [phone]

## 2019-03-16 NOTE — Progress Notes (Addendum)
PROGRESS NOTE    Justin Atkinson.  [id number] DOB: 07-Jan-1941 DOA: 02/13/2019 PCP: [name], MD   Brief Narrative:  78 year old  WM PMHx COPD/pulmonary fibrosis recently diagnosed hypersensitivity pneumonitis (had VATS wedge resection on 11/9;follows with Dr. Vaughan Browner) who was recently started on steroid on 11/18, HTN, diabetes type 2 uncontrolled with complication, HLD, and DJD   Presented with increasing shortness of breath with nasal congestion, cough for about 10 days. Also noticed that his oxygen dropped to 89% on room air. Called the pulmonary office who asked him to come to the ED. In the ED he is hypoxicat80%, tachypneic and mildly hypertensive. CT angiogram of the chest was negative for PE and showed bilateral diffuse groundglass opacity. Admitted to ICU on high flow started on IV Solu-Medrol. COVID-19 test returned as positive. In the ED he had elevated inflammatory markers including LDH, ferritin, CRP, lactic acid and D-dimer. Patient remains on high flow oxygen. 03/01/2019: Remains 100% high flow nasal cannula oxygen.  Transferring to [place] to continue treatment.  NOTE review of Dr. Vaughan Browner PCCM note 02/18/2019 patient was sent home on 2 L O2, pulmonary fibrosis, RIGHT VATS/11/9 performed showed hypersensitivity pneumonitis   Subjective: 12/14 afebrile overnight A/O x4, negative abdominal pain.  Negative N/V.  Positive S OB but slightly improved.    Assessment & Plan:   Principal Problem:   Acute respiratory failure with hypoxia (HCC) Active Problems:   Hypertension   Hypercholesterolemia   Benign prostate hyperplasia   ILD (interstitial lung disease) (HCC)   Postinflammatory pulmonary fibrosis (HCC)   HCAP (healthcare-associated pneumonia)   Chronic obstructive pulmonary disease ([place])   Pneumonia due to COVID-19 virus   Severe sepsis ([place])   Essential hypertension   Diabetes mellitus type 2, controlled, with complications ([place])  Pulmonary fibrosis (HCC)   Hypersensitivity pneumonitis ([place])   Covid pneumonia/acute respiratory failure with hypoxia COVID-19 Labs  Recent Labs    03/14/19 0336 03/15/19 0059 03/16/19 0515  DDIMER 1.63* 1.93* 2.00*  FERRITIN 522* 407* 393*  CRP 0.7 0.5 0.6    Lab Results  Component Value Date   SARSCOV2NAA POSITIVE (A) 02/20/2019   SARSCOV2NAA NOT DETECTED 02/05/2019   SARSCOV2NAA NOT DETECTED 01/09/2019   -12/14 increase Solu-Medrol 60 mg BID   -Remdesivir per pharmacy protocol -11/29 Actemra x1 dose -11/30 Covid Convalescent Plasma x1  -Albuterol PRN -Flutter valve -Incentive spirometry -Titrate O2 to maintain SPO2> 88% -Prone patient 16 hours/day; if patient cannot tolerate prone at least 2 to 3 hours per shift -Xanax PRN anxiety have decreased dose -Morphine PRN air hunger have decreased dose  HCAP/severe sepsis -Trend procalcitonin Results for SHLOIME, CREESE (MRN [id number]) as of 03/01/2019 18:22  Ref. Range 02/07/2019 11:47 02/28/2019 07:57 03/01/2019 03:33  Procalcitonin Latest Units: ng/mL 0.13 <0.10 <0.10  -Completed cefepime -12/10 sputum; normal respiratory flora  Pulmonary fibrosis/Hypersensitivity Pneumonitis -Per Dr. Vaughan Browner PCCM note 11/9 worsening pulmonary fibrosis s  -11/9 RIGHT VATS; per Dr. Vaughan Browner PCCM note showed hypersensitivity pneumonitis  -See Covid pneumonia  Hypertension -BP medication discontinued patient's BP has been running on the low side. -12/10 improvement in patient's BP with decrease in sedating medication.  Still does not require BP medication.  HLD -Crestor 10 mg daily  Diabetes type 2 controlled with complication -123456 hemoglobin A1c= 7.0 -Resistant SSI -Levemir 14 units daily/12 units qhs -12/14 increase NovoLog 10 units qac  -Resistant SSI  Insomnia -Discontinue trazodone -Patient on Seroquel 50 mg qhs   Daytime sleepiness -Slowly decreasing sedating  medication benzodiazepine, opiates,  Haldol    DVT prophylaxis: Lovenox  Code Status: Full Family Communication: 12/12 spoke with daughter and wife counseled him on plan of care answered all questions.  [name] his wife request that we contact [name] (daughter) as Leon [phone] Disposition Plan: TBD   Consultants:    Procedures/Significant Events:  11/30 Covid Convalescent Plasma x1    I have personally reviewed and interpreted all radiology studies and my findings are as above.  VENTILATOR SETTINGS: HFNC 12/14 Flow rate; 15 L/min FiO2;  SPO2; 90%    Cultures 11/27 SARS coronavirus positive 11/29 influenza A/B negative 12/10 sputum normal respiratory flora    Antimicrobials: Anti-infectives (From admission, onward)   Start     Dose/Rate Stop   03/01/19 0800  remdesivir 100 mg in sodium chloride 0.9 % 250 mL IVPB     100 mg 500 mL/hr over 30 Minutes 03/04/19 1216   02/28/19 1000  remdesivir 200 mg in sodium chloride 0.9 % 250 mL IVPB     200 mg 500 mL/hr over 30 Minutes 02/28/19 1006   02/28/19 0000  vancomycin (VANCOCIN) 1,250 mg in sodium chloride 0.9 % 250 mL IVPB  Status:  Discontinued     1,250 mg 166.7 mL/hr over 90 Minutes 02/20/2019 2037   02/05/2019 2200  ceFEPIme (MAXIPIME) 2 g in sodium chloride 0.9 % 100 mL IVPB  Status:  Discontinued     2 g 200 mL/hr over 30 Minutes 03/03/19 1242   02/19/2019 1315  ceFEPIme (MAXIPIME) 2 g in sodium chloride 0.9 % 100 mL IVPB     2 g 200 mL/hr over 30 Minutes 02/28/19 0324   03/02/2019 1315  vancomycin (VANCOCIN) 2,000 mg in sodium chloride 0.9 % 500 mL IVPB     2,000 mg 250 mL/hr over 120 Minutes 02/28/19 0324       Devices    LINES / TUBES:      Continuous Infusions:    Objective: Vitals:   03/15/19 1600 03/15/19 2055 03/15/19 2328 03/16/19 0432  BP: 124/69  127/82 109/80  Pulse: 75  (!) 58 61  Resp: (!) 23   (!) 23  Temp: 97.9 F (36.6 C) 97.8 F (36.6 C) 98.2 F (36.8 C) 97.6 F (36.4 C)  TempSrc: Axillary Axillary Oral Oral   SpO2: 90%  90% 92%  Weight:      Height:        Intake/Output Summary (Last 24 hours) at 03/16/2019 0839 Last data filed at 03/16/2019 [id number] Gross per 24 hour  Intake 600 ml  Output 1325 ml  Net -725 ml   Filed Weights   02/16/2019 1810 02/28/19 0812 03/06/19 1607  Weight: 130.7 kg 130.7 kg 124.2 kg   Physical Exam:  General: A/O x4, positive acute respiratory distress Eyes: negative scleral hemorrhage, negative anisocoria, negative icterus ENT: Negative Runny nose, negative gingival bleeding, Neck:  Negative scars, masses, torticollis, lymphadenopathy, JVD Lungs: Diffuse crackles, positive air movement all lung fields, without wheezes or crackles Cardiovascular: Regular rate and rhythm without murmur gallop or rub normal S1 and S2 Abdomen: negative abdominal pain, nondistended, positive soft, bowel sounds, no rebound, no ascites, no appreciable mass Extremities: No significant cyanosis, clubbing, or edema bilateral lower extremities Skin: Negative rashes, lesions, ulcers Psychiatric:  Negative depression, negative anxiety, negative fatigue, negative mania  Central nervous system:  Cranial nerves II through XII intact, tongue/uvula midline, all extremities muscle strength 5/5, sensation intact throughout, negative dysarthria, negative expressive aphasia, negative receptive aphasia.  Data Reviewed: Care during the described time interval was provided by me .  I have reviewed this patient's available data, including medical history, events of note, physical examination, and all test results as part of my evaluation.   CBC: Recent Labs  Lab 03/11/19 0535 03/12/19 0400 03/13/19 0337 03/14/19 0336 03/15/19 0059  WBC 11.3* 12.1* 10.3 11.8* 10.4  NEUTROABS  --  10.4* 8.7* 10.0* 8.7*  HGB 15.7 15.1 14.6 14.3 13.9  HCT 48.3 46.1 45.3 44.1 43.5  MCV 94.7 93.5 95.6 95.2 96.5  PLT 158 163 146* 143* [id number]*   Basic Metabolic Panel: Recent Labs  Lab 03/12/19 0400 03/13/19 0337  03/14/19 0336 03/15/19 0059 03/16/19 0515  NA 138 139 138 138 139  K 4.1 4.0 4.5 4.5 4.6  CL 97* 96* 98 97* 100  CO2 29 32 31 32 30  GLUCOSE 98 86 102* 95 91  BUN 41* 36* 35* 32* 31*  CREATININE 0.87 0.98 0.93 0.95 1.00  CALCIUM 9.2 9.1 9.3 9.0 8.8*  MG 2.3 2.3 2.3 2.3 2.3  PHOS 3.1 3.7 3.4 3.5 3.5   GFR: Estimated Creatinine Clearance: 86.8 mL/min (by C-G formula based on SCr of 1 mg/dL). Liver Function Tests: Recent Labs  Lab 03/12/19 0400 03/13/19 0337 03/14/19 0336 03/15/19 0059 03/16/19 0515  AST 25 19 24 23 23   ALT 50* 48* 61* 50* 52*  ALKPHOS 80 73 72 69 60  BILITOT 0.8 0.7 0.8 0.7 0.7  PROT 6.0* 5.7* 5.9* 5.9* 5.6*  ALBUMIN 3.1* 3.0* 3.1* 3.2* 3.1*   No results for input(s): LIPASE, AMYLASE in the last 168 hours. No results for input(s): AMMONIA in the last 168 hours. Coagulation Profile: No results for input(s): INR, PROTIME in the last 168 hours. Cardiac Enzymes: No results for input(s): CKTOTAL, CKMB, CKMBINDEX, TROPONINI in the last 168 hours. BNP (last 3 results) No results for input(s): PROBNP in the last 8760 hours. HbA1C: No results for input(s): HGBA1C in the last 72 hours. CBG: Recent Labs  Lab 03/15/19 0812 03/15/19 1229 03/15/19 1617 03/15/19 2050 03/16/19 0802  GLUCAP 89 194* 229* 201* 101*   Lipid Profile: No results for input(s): CHOL, HDL, LDLCALC, TRIG, CHOLHDL, LDLDIRECT in the last 72 hours. Thyroid Function Tests: No results for input(s): TSH, T4TOTAL, FREET4, T3FREE, THYROIDAB in the last 72 hours. Anemia Panel: Recent Labs    03/14/19 0336 03/15/19 0059  FERRITIN 522* 407*   Urine analysis:    Component Value Date/Time   COLORURINE YELLOW 02/28/2019 1915   APPEARANCEUR CLEAR 02/21/2019 1915   LABSPEC >1.046 (H) 02/09/2019 1915   PHURINE 6.0 03/01/2019 1915   GLUCOSEU NEGATIVE 02/15/2019 1915   HGBUR NEGATIVE 02/03/2019 1915   [place] NEGATIVE 02/23/2019 [place] NEGATIVE 02/04/2019 1915   PROTEINUR  NEGATIVE 02/06/2019 1915   NITRITE NEGATIVE 02/05/2019 1915   LEUKOCYTESUR NEGATIVE 02/09/2019 1915   Sepsis Labs: @LABRCNTIP (procalcitonin:4,lacticidven:4)  ) Recent Results (from the past 240 hour(s))  Expectorated sputum assessment w rflx to resp cult     Status: None   Collection Time: 03/12/19  2:46 PM   Specimen: Sputum  Result Value Ref Range Status   Specimen Description SPU  Final   Special Requests NONE  Final   Sputum evaluation   Final    THIS SPECIMEN IS ACCEPTABLE FOR SPUTUM CULTURE Performed at [facility], [place] [address], [place], [place] [address]    Report Status 03/12/2019 FINAL  Final  Culture, respiratory     Status:  None   Collection Time: 03/12/19  2:46 PM   Specimen: Sputum  Result Value Ref Range Status   Specimen Description   Final    SPU Performed at [facility], [place] [address], [place], [place] [address]    Special Requests   Final    NONE Reflexed from [id number] Performed at [facility], [place] [address], [place], [place] [address]    Gram Stain   Final    MODERATE WBC PRESENT, PREDOMINANTLY PMN RARE GRAM POSITIVE COCCI    Culture   Final    FEW Consistent with normal respiratory flora. Performed at [facility], [place] [address], [place], [place] [address]    Report Status 03/15/2019 FINAL  Final         Radiology Studies: No results found.      Scheduled Meds: . aspirin EC  81 mg Oral Daily  . Chlorhexidine Gluconate Cloth  6 each Topical Daily  . enoxaparin (LOVENOX) injection  60 mg Subcutaneous Q24H  . fluticasone  2 spray Each Nare Daily  . guaiFENesin  1,200 mg Oral BID  . insulin aspart  0-20 Units Subcutaneous TID WC  . insulin aspart  6 Units Subcutaneous TID WC  . insulin detemir  12 Units Subcutaneous QHS  . insulin detemir  14 Units Subcutaneous Daily  . latanoprost  1 drop Both Eyes QHS  . loratadine  10 mg Oral Daily  . mouth rinse  15 mL Mouth  Rinse BID  . methylPREDNISolone (SOLU-MEDROL) injection  30 mg Intravenous Daily  . omega-3 acid ethyl esters  1 g Oral Daily  . pantoprazole  40 mg Oral Daily  . QUEtiapine  50 mg Oral QHS  . rosuvastatin  10 mg Oral Daily  . timolol  1 drop Left Eye BID  . umeclidinium-vilanterol  1 puff Inhalation Daily   Continuous Infusions:    LOS: 17 days   The patient is critically ill with multiple organ systems failure and requires high complexity decision making for assessment and support, frequent evaluation and titration of therapies, application of advanced monitoring technologies and extensive interpretation of multiple databases. Critical Care Time devoted to patient care services described in this note  Time spent: 40 minutes     Rudolpho Claxton, Geraldo Docker, MD [facility] Pager [phone]  If 7PM-7AM, please contact night-coverage [email or web address] Password [facility] 03/16/2019, 8:39 AM

## 2019-03-16 NOTE — Progress Notes (Signed)
Physical Therapy Treatment Patient Details Name: Justin Atkinson. MRN: [id number] DOB: [birth year] Today's Date: 03/16/2019    History of Present Illness Pt is a 78 y.o. male recently admitted s/p VATS with lobe wedge resection (02/09/2019), now admitted 02/08/2019 with worsening cough and SOB; tested (+) COVID-19. Other PMH includes COPD/pulmonary fibrosis, cervical DDD, HTN, DM, HLD.    PT Comments    Patient highly motivated to return home to his wife. He continues to require both 15L HFNC and 15L PNRB during activity (however he is only on HFNC at rest). Requires frequent cues to stop talking and focus on his breathing, however once cued he does well with remaining quiet and using pursed lip breathing to regain O2 sats (decreases as low as 72%, returns to 90-91% after typically 3 minute seated rest). Patient inquiring re: CIR's admission criteria for patient's recovering from [place].     Follow Up Recommendations  CIR;Supervision for mobility/OOB     Equipment Recommendations  Rolling walker with 5" wheels;3in1 (PT)    Recommendations for Other Services       Precautions / Restrictions Precautions Precautions: Fall;Other (comment) Precaution Comments: HFNC and 15L NRB, quick to desaturate Restrictions Weight Bearing Restrictions: No    Mobility  Bed Mobility                  Transfers Overall transfer level: Needs assistance Equipment used: None Transfers: Sit to/from Stand Sit to Stand: Min assist         General transfer comment: seated rests between x 3   Ambulation/Gait             General Gait Details: unable due to decr sats; requiring both HFNC and PNRB; pre-gait activities   Stairs             Wheelchair Mobility    Modified Rankin (Stroke Patients Only)       Balance Overall balance assessment: Needs assistance   Sitting balance-Leahy Scale: Fair Sitting balance - Comments: per RN, almost fell reaching for urinal while  seated     Standing balance-Leahy Scale: Fair Standing balance comment: able to stand statically without UE support; min assist with any pre-gait                            Cognition Arousal/Alertness: Awake/alert Behavior During Therapy: WFL for tasks assessed/performed Overall Cognitive Status: Within Functional Limits for tasks assessed                                        Exercises General Exercises - Upper Extremity Shoulder Flexion: AROM;Both;Limitations;5 reps General Exercises - Lower Extremity Ankle Circles/Pumps: AROM;Both;10 reps Long Arc Quad: AROM;Both;10 reps;Seated Hip Flexion/Marching: AROM;Both;20 reps(alternating, 10 each leg) Other Exercises Other Exercises: standing wt-shifting lt to rt x 10, bil UE support "shelf arm" Other Exercises: step forward and backward x 2 reps each leg and required seated rest    General Comments General comments (skin integrity, edema, etc.): On 15L HFNC only at rest sats 93 to 88% (with talking); added PNRB during activity with sats decr to 72% and returned to 88% with 2 min seated rest and cued PLB.      Pertinent Vitals/Pain Pain Assessment: No/denies pain Faces Pain Scale: No hurt    Home Living  Prior Function            PT Goals (current goals can now be found in the care plan section) Acute Rehab PT Goals Patient Stated Goal: to get home to his wife Time For Goal Achievement: 03/27/19 Potential to Achieve Goals: Good Progress towards PT goals: Progressing toward goals    Frequency    Min 3X/week      PT Plan Current plan remains appropriate    Co-evaluation              AM-PAC PT "6 Clicks" Mobility   Outcome Measure  Help needed turning from your back to your side while in a flat bed without using bedrails?: A Little Help needed moving from lying on your back to sitting on the side of a flat bed without using bedrails?: A Little Help  needed moving to and from a bed to a chair (including a wheelchair)?: A Little Help needed standing up from a chair using your arms (e.g., wheelchair or bedside chair)?: A Little Help needed to walk in hospital room?: Total Help needed climbing 3-5 steps with a railing? : Total 6 Click Score: 14    End of Session Equipment Utilized During Treatment: Oxygen Activity Tolerance: Treatment limited secondary to medical complications (Comment)(requires frequent rest breaks due to desats) Patient left: in chair;with call bell/phone within reach   PT Visit Diagnosis: Muscle weakness (generalized) (M62.81);Difficulty in walking, not elsewhere classified (R26.2)     Time: [id number] PT Time Calculation (min) (ACUTE ONLY): 48 min  Charges:  $Therapeutic Activity: 23-37 mins $Self Care/Home Management: 8-22                      Barry Brunner, PT Pager [phone]    Rexanne Mano 03/16/2019, 6:35 PM

## 2019-03-17 ENCOUNTER — Ambulatory Visit: Payer: Medicare Other | Admitting: Pulmonary Disease

## 2019-03-17 LAB — COMPREHENSIVE METABOLIC PANEL
ALT: 64 U/L — ABNORMAL HIGH (ref 0–44)
AST: 29 U/L (ref 15–41)
Albumin: 3.7 g/dL (ref 3.5–5.0)
Alkaline Phosphatase: 64 U/L (ref 38–126)
Anion gap: 10 (ref 5–15)
BUN: 31 mg/dL — ABNORMAL HIGH (ref 8–23)
CO2: 29 mmol/L (ref 22–32)
Calcium: 9.2 mg/dL (ref 8.9–10.3)
Chloride: 98 mmol/L (ref 98–111)
Creatinine, Ser: 0.86 mg/dL (ref 0.61–1.24)
GFR calc Af Amer: >60 mL/min (ref >60)
GFR calc non Af Amer: >60 mL/min (ref >60)
Glucose, Bld: 198 mg/dL — ABNORMAL HIGH (ref 70–99)
Potassium: 4.8 mmol/L (ref 3.5–5.1)
Sodium: 137 mmol/L (ref 135–145)
Total Bilirubin: 0.9 mg/dL (ref 0.3–1.2)
Total Protein: 6.5 g/dL (ref 6.5–8.1)

## 2019-03-17 LAB — CBC WITH DIFFERENTIAL/PLATELET
Abs Immature Granulocytes: 0.08 10*3/uL — ABNORMAL HIGH (ref 0.00–0.07)
Basophils Absolute: 0 10*3/uL (ref 0.0–0.1)
Basophils Relative: 0 %
Eosinophils Absolute: 0 10*3/uL (ref 0.0–0.5)
Eosinophils Relative: 0 %
HCT: 44.7 % (ref 39.0–52.0)
Hemoglobin: 14.5 g/dL (ref 13.0–17.0)
Immature Granulocytes: 1 %
Lymphocytes Relative: 2 %
Lymphs Abs: 0.3 10*3/uL — ABNORMAL LOW (ref 0.7–4.0)
MCH: 31 pg (ref 26.0–34.0)
MCHC: 32.4 g/dL (ref 30.0–36.0)
MCV: 95.5 fL (ref 80.0–100.0)
Monocytes Absolute: 0.3 10*3/uL (ref 0.1–1.0)
Monocytes Relative: 3 %
Neutro Abs: 10.8 10*3/uL — ABNORMAL HIGH (ref 1.7–7.7)
Neutrophils Relative %: 94 %
Platelets: 117 10*3/uL — ABNORMAL LOW (ref 150–400)
RBC: 4.68 MIL/uL (ref 4.22–5.81)
RDW: 13.9 % (ref 11.5–15.5)
WBC: 11.5 10*3/uL — ABNORMAL HIGH (ref 4.0–10.5)
nRBC: 0 % (ref 0.0–0.2)

## 2019-03-17 LAB — C-REACTIVE PROTEIN
CRP: 0.5 mg/dL (ref <1.0)
CRP: 0.5 mg/dL (ref <1.0)

## 2019-03-17 LAB — PHOSPHORUS: Phosphorus: 3.7 mg/dL (ref 2.5–4.6)

## 2019-03-17 LAB — D-DIMER, QUANTITATIVE: D-Dimer, Quant: 1.91 ug/mL-FEU — ABNORMAL HIGH (ref 0.00–0.50)

## 2019-03-17 LAB — GLUCOSE, CAPILLARY
Glucose-Capillary: 127 mg/dL — ABNORMAL HIGH (ref 70–99)
Glucose-Capillary: 195 mg/dL — ABNORMAL HIGH (ref 70–99)
Glucose-Capillary: 182 mg/dL — ABNORMAL HIGH (ref 70–99)

## 2019-03-17 LAB — MAGNESIUM: Magnesium: 2.3 mg/dL (ref 1.7–2.4)

## 2019-03-17 NOTE — Progress Notes (Signed)
Occupational Therapy Treatment Patient Details Name: Justin Atkinson. MRN: [id number] DOB: Jun 06, 1940 Today's Date: 03/17/2019    History of present illness Pt is a 78 y.o. male recently admitted s/p VATS with lobe wedge resection (02/09/2019), now admitted 02/02/2019 with worsening cough and SOB; tested (+) COVID-19. Other PMH includes COPD/pulmonary fibrosis, cervical DDD, HTN, DM, HLD.   OT comments  Pt continues to make progress in therapy, demonstrating improved activity tolerance and independence in self-care and functional transfer tasks. Educated pt on safety strategies, activity modifications, and relaxation techniques with good understanding. Educated and provided pt with handout regarding energy conservation strategies. Pt engaged in standing tolerance task to increase strength, endurance, and balance required for self-care and functional transfer tasks. Pt tolerated standing 1 x 1 min, 1 x 1 min 15 sec, and 1 x 1 min 5 sec with RW and supervision. Noted 0 instances of LOB. [name] SpO2 decreased to mid 70s with each stand, requiring 3-4 min seated rest break for recovery back to 90s. Pt currently on 15L HFNC + 15L NRB. [name] SpO2 decreased to 84% during seated grooming/hygiene task with quick return back to 90s. 2/4 DOE. Frequency increased from 2x/week to 3x/week to reflect pt's increased activity tolerance.    Follow Up Recommendations  CIR    Equipment Recommendations  3 in 1 bedside commode    Recommendations for Other Services      Precautions / Restrictions Precautions Precautions: Fall;Other (comment) Precaution Comments: 15L HFNC + 15L NRB, desats very quickly Restrictions Weight Bearing Restrictions: No       Mobility Bed Mobility               General bed mobility comments: Pt sitting in bedside chair upon OT arrival  Transfers Overall transfer level: Needs assistance Equipment used: Rolling walker (2 wheeled) Transfers: Sit to/from Stand Sit to Stand:  Min guard         General transfer comment: Sit/stand x 3 with min guard and use of RW for support. No instances of LOB.    Balance Overall balance assessment: Needs assistance   Sitting balance-Leahy Scale: Fair       Standing balance-Leahy Scale: Fair                             ADL either performed or assessed with clinical judgement   ADL Overall ADL's : Needs assistance/impaired     Grooming: Wash/dry face;Wash/dry hands;Brushing hair;Set up;Sitting               Lower Body Dressing: Supervision/safety;Sitting/lateral leans Lower Body Dressing Details (indicate cue type and reason): To don/doff socks, utilizing figure four position                     Vision       Perception     Praxis      Cognition Arousal/Alertness: Awake/alert Behavior During Therapy: WFL for tasks assessed/performed Overall Cognitive Status: Within Functional Limits for tasks assessed                                          Exercises Exercises: Other exercises Other Exercises Other Exercises: Incentive spirometer x 10 with min cues on technique. Pulling 574mL. Other Exercises: Flutter valve x 10 with min cues on technique.    Shoulder Instructions  General Comments Continued education with pt on energy conservation, relaxation strategies, and activity modifications with good understanding. Pt on 15L HFNC + 15L NRB, desats down into 70s with activity. 3-4 min seated rest break for return back to 90s.     Pertinent Vitals/ Pain       Pain Assessment: No/denies pain  Home Living                                          Prior Functioning/Environment              Frequency  Min 3X/week        Progress Toward Goals  OT Goals(current goals can now be found in the care plan section)  Progress towards OT goals: Progressing toward goals  Acute Rehab OT Goals Patient Stated Goal: to get home to his  wife ADL Goals Pt Will Perform Grooming: sitting;with set-up;with supervision Pt Will Transfer to Toilet: with mod assist;stand pivot transfer;bedside commode Pt Will Perform Toileting - Clothing Manipulation and hygiene: with mod assist;sitting/lateral leans;sit to/from stand Additional ADL Goal #1: Pt will tolerate sitting at EOB for ~5 minutes with Min A in preparation for ADLs  Plan Discharge plan remains appropriate;Frequency needs to be updated    Co-evaluation                 AM-PAC OT "6 Clicks" Daily Activity     Outcome Measure   Help from another person eating meals?: None Help from another person taking care of personal grooming?: A Little Help from another person toileting, which includes using toliet, bedpan, or urinal?: A Lot Help from another person bathing (including washing, rinsing, drying)?: A Lot Help from another person to put on and taking off regular upper body clothing?: A Lot Help from another person to put on and taking off regular lower body clothing?: A Lot 6 Click Score: 15    End of Session Equipment Utilized During Treatment: Rolling walker;Oxygen  OT Visit Diagnosis: Unsteadiness on feet (R26.81);Muscle weakness (generalized) (M62.81)   Activity Tolerance Patient limited by fatigue(Limited by SOB)   Patient Left in chair;with call bell/phone within reach   Nurse Communication Mobility status        Time: [id number] OT Time Calculation (min): 49 min  Charges: OT General Charges $OT Visit: 1 Visit OT Treatments $Self Care/Home Management : 8-22 mins $Therapeutic Activity: 23-37 mins  Mauri Brooklyn OTR/L [phone]    Mauri Brooklyn 03/17/2019, 11:56 AM

## 2019-03-17 NOTE — Progress Notes (Addendum)
PROGRESS NOTE    Justin Atkinson.  [id number] DOB: [birth year] DOA: 03/02/2019 PCP: [name], MD   Brief Narrative:  78 year old  WM PMHx COPD/pulmonary fibrosis recently diagnosed hypersensitivity pneumonitis (had VATS wedge resection on 11/9;follows with Dr. Vaughan Browner) who was recently started on steroid on 11/18, HTN, diabetes type 2 uncontrolled with complication, HLD, and DJD   Presented with increasing shortness of breath with nasal congestion, cough for about 10 days. Also noticed that his oxygen dropped to 89% on room air. Called the pulmonary office who asked him to come to the ED. In the ED he is hypoxicat80%, tachypneic and mildly hypertensive. CT angiogram of the chest was negative for PE and showed bilateral diffuse groundglass opacity. Admitted to ICU on high flow started on IV Solu-Medrol. COVID-19 test returned as positive. In the ED he had elevated inflammatory markers including LDH, ferritin, CRP, lactic acid and D-dimer. Patient remains on high flow oxygen. 03/01/2019: Remains 100% high flow nasal cannula oxygen.  Transferring to [facility] to continue treatment.  NOTE review of Dr. Vaughan Browner PCCM note 02/18/2019 patient was sent home on 2 L O2, pulmonary fibrosis, RIGHT VATS/11/9 performed showed hypersensitivity pneumonitis   Subjective: 12/15 afebrile overnight A/O x4, positive S OB but slightly improved    Assessment & Plan:   Principal Problem:   Acute respiratory failure with hypoxia ([place]) Active Problems:   Hypertension   Hypercholesterolemia   Benign prostate hyperplasia   ILD (interstitial lung disease) ([place])   Postinflammatory pulmonary fibrosis ([facility])   HCAP (healthcare-associated pneumonia)   Chronic obstructive pulmonary disease ([place])   Pneumonia due to COVID-19 virus   Severe sepsis ([place])   Essential hypertension   Diabetes mellitus type 2, controlled, with complications ([place])   Pulmonary fibrosis ([place])   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[place])   Covid pneumonia/acute respiratory failure with hypoxia COVID-19 Labs  Recent Labs    03/15/19 0059 03/16/19 0515 03/17/19 0425  DDIMER 1.93* 2.00*  --   FERRITIN 407* 393*  --   CRP 0.5 0.6 0.5    Lab Results  Component Value Date   SARSCOV2NAA POSITIVE (A) 02/14/2019   SARSCOV2NAA NOT DETECTED 02/05/2019   SARSCOV2NAA NOT DETECTED 01/09/2019   -12/14 increase Solu-Medrol 60 mg BID   -Remdesivir per pharmacy protocol -11/29 Actemra x1 dose -11/30 Covid Convalescent Plasma x1  -Albuterol PRN -Flutter valve -Incentive spirometry -Titrate O2 to maintain SPO2> 88% -Prone patient 16 hours/day; if patient cannot tolerate prone at least 2 to 3 hours per shift -Xanax PRN anxiety have decreased dose -Morphine PRN air hunger have decreased dose  HCAP/severe sepsis -Trend procalcitonin Results for HOLGER, ARLINGHAUS (MRN [id number]) as of 03/01/2019 18:22  Ref. Range 02/13/2019 11:47 02/28/2019 07:57 03/01/2019 03:33  Procalcitonin Latest Units: ng/mL 0.13 <0.10 <0.10  -Completed cefepime -12/10 sputum; normal respiratory flora  Pulmonary fibrosis/Hypersensitivity Pneumonitis -Per Dr. Vaughan Browner PCCM note 11/9 worsening pulmonary fibrosis s  -11/9 RIGHT VATS; per Dr. Vaughan Browner PCCM note showed hypersensitivity pneumonitis  -See Covid pneumonia  Hypertension -BP medication discontinued patient's BP has been running on the low side. -12/10 improvement in patient's BP with decrease in sedating medication.  Still does not require BP medication.  HLD -Crestor 10 mg daily  Diabetes type 2 controlled with complication -123456 hemoglobin A1c= 7.0 -Resistant SSI -Levemir 14 units daily/12 units qhs -12/14 increase NovoLog 10 units qac  -Resistant SSI  Insomnia -Discontinue trazodone -Patient on Seroquel 50 mg qhs   Daytime sleepiness -Slowly decreasing sedating medication  benzodiazepine, opiates, Haldol    DVT prophylaxis: Lovenox  Code Status:  Full Family Communication: 12/15 spoke with daughter and wife counseled him on plan of care answered all questions.  [name] his wife request that we contact [name] (daughter) as [place] [phone] Disposition Plan: TBD   Consultants:    Procedures/Significant Events:  11/30 Covid Convalescent Plasma x1    I have personally reviewed and interpreted all radiology studies and my findings are as above.  VENTILATOR SETTINGS: NRB 12/15  flow rate; 15 L/min FiO2;  SPO2; 81%    Cultures 11/27 SARS coronavirus positive 11/29 influenza A/B negative 12/10 sputum normal respiratory flora    Antimicrobials: Anti-infectives (From admission, onward)   Start     Dose/Rate Stop   03/01/19 0800  remdesivir 100 mg in sodium chloride 0.9 % 250 mL IVPB     100 mg 500 mL/hr over 30 Minutes 03/04/19 1216   02/28/19 1000  remdesivir 200 mg in sodium chloride 0.9 % 250 mL IVPB     200 mg 500 mL/hr over 30 Minutes 02/28/19 1006   02/28/19 0000  vancomycin (VANCOCIN) 1,250 mg in sodium chloride 0.9 % 250 mL IVPB  Status:  Discontinued     1,250 mg 166.7 mL/hr over 90 Minutes 02/03/2019 2037   02/24/2019 2200  ceFEPIme (MAXIPIME) 2 g in sodium chloride 0.9 % 100 mL IVPB  Status:  Discontinued     2 g 200 mL/hr over 30 Minutes 03/03/19 1242   02/15/2019 1315  ceFEPIme (MAXIPIME) 2 g in sodium chloride 0.9 % 100 mL IVPB     2 g 200 mL/hr over 30 Minutes 02/28/19 0324   02/05/2019 1315  vancomycin (VANCOCIN) 2,000 mg in sodium chloride 0.9 % 500 mL IVPB     2,000 mg 250 mL/hr over 120 Minutes 02/28/19 0324       Devices    LINES / TUBES:      Continuous Infusions:    Objective: Vitals:   03/17/19 0420 03/17/19 0425 03/17/19 0813 03/17/19 1208  BP:  129/67 132/71   Pulse: 65 61 (!) 10   Resp: 20 19 (!) 22 (!) 24  Temp:  97.7 F (36.5 C) 97.6 F (36.4 C) 97.8 F (36.6 C)  TempSrc:  Oral Oral Oral  SpO2: (!) 70% 91% 94%   Weight:      Height:        Intake/Output Summary (Last  24 hours) at 03/17/2019 1415 Last data filed at 03/17/2019 1353 Gross per 24 hour  Intake 480 ml  Output 900 ml  Net -420 ml   Filed Weights   02/22/2019 1810 02/28/19 0812 03/06/19 1607  Weight: 130.7 kg 130.7 kg 124.2 kg   Physical Exam:  General: A/O x4, positive acute respiratory distress Eyes: negative scleral hemorrhage, negative anisocoria, negative icterus ENT: Negative Runny nose, negative gingival bleeding, Neck:  Negative scars, masses, torticollis, lymphadenopathy, JVD Lungs: Diffuse crackles but improved from 12/14 , diffuse inspiratory and expiratory wheezing, Cardiovascular: Regular rate and rhythm without murmur gallop or rub normal S1 and S2 Abdomen: negative abdominal pain, nondistended, positive soft, bowel sounds, no rebound, no ascites, no appreciable mass Extremities: No significant cyanosis, clubbing, or edema bilateral lower extremities Skin: Negative rashes, lesions, ulcers Psychiatric:  Negative depression, negative anxiety, negative fatigue, negative mania  Central nervous system:  Cranial nerves II through XII intact, tongue/uvula midline, all extremities muscle strength 5/5, sensation intact throughout, negative dysarthria, negative expressive aphasia, negative receptive aphasia.     Data  Reviewed: Care during the described time interval was provided by me .  I have reviewed this patient's available data, including medical history, events of note, physical examination, and all test results as part of my evaluation.   CBC: Recent Labs  Lab 03/12/19 0400 03/13/19 0337 03/14/19 0336 03/15/19 0059 03/16/19 0515  WBC 12.1* 10.3 11.8* 10.4 8.7  NEUTROABS 10.4* 8.7* 10.0* 8.7* 6.8  HGB 15.1 14.6 14.3 13.9 13.5  HCT 46.1 45.3 44.1 43.5 42.3  MCV 93.5 95.6 95.2 96.5 95.3  PLT 163 146* 143* 141* [id number]*   Basic Metabolic Panel: Recent Labs  Lab 03/12/19 0400 03/13/19 0337 03/14/19 0336 03/15/19 0059 03/16/19 0515  NA 138 139 138 138 139  K 4.1 4.0  4.5 4.5 4.6  CL 97* 96* 98 97* 100  CO2 29 32 31 32 30  GLUCOSE 98 86 102* 95 91  BUN 41* 36* 35* 32* 31*  CREATININE 0.87 0.98 0.93 0.95 1.00  CALCIUM 9.2 9.1 9.3 9.0 8.8*  MG 2.3 2.3 2.3 2.3 2.3  PHOS 3.1 3.7 3.4 3.5 3.5   GFR: Estimated Creatinine Clearance: 86.8 mL/min (by C-G formula based on SCr of 1 mg/dL). Liver Function Tests: Recent Labs  Lab 03/12/19 0400 03/13/19 0337 03/14/19 0336 03/15/19 0059 03/16/19 0515  AST 25 19 24 23 23   ALT 50* 48* 61* 50* 52*  ALKPHOS 80 73 72 69 60  BILITOT 0.8 0.7 0.8 0.7 0.7  PROT 6.0* 5.7* 5.9* 5.9* 5.6*  ALBUMIN 3.1* 3.0* 3.1* 3.2* 3.1*   No results for input(s): LIPASE, AMYLASE in the last 168 hours. No results for input(s): AMMONIA in the last 168 hours. Coagulation Profile: No results for input(s): INR, PROTIME in the last 168 hours. Cardiac Enzymes: No results for input(s): CKTOTAL, CKMB, CKMBINDEX, TROPONINI in the last 168 hours. BNP (last 3 results) No results for input(s): PROBNP in the last 8760 hours. HbA1C: No results for input(s): HGBA1C in the last 72 hours. CBG: Recent Labs  Lab 03/16/19 1227 03/16/19 1749 03/16/19 2029 03/17/19 0811 03/17/19 1206  GLUCAP 128* 207* 194* 127* 195*   Lipid Profile: No results for input(s): CHOL, HDL, LDLCALC, TRIG, CHOLHDL, LDLDIRECT in the last 72 hours. Thyroid Function Tests: No results for input(s): TSH, T4TOTAL, FREET4, T3FREE, THYROIDAB in the last 72 hours. Anemia Panel: Recent Labs    03/15/19 0059 03/16/19 0515  FERRITIN 407* 393*   Urine analysis:    Component Value Date/Time   COLORURINE YELLOW 02/01/2019 1915   APPEARANCEUR CLEAR 02/08/2019 1915   LABSPEC >1.046 (H) 02/22/2019 1915   PHURINE 6.0 02/01/2019 1915   GLUCOSEU NEGATIVE 02/24/2019 1915   HGBUR NEGATIVE 02/17/2019 1915   [place] NEGATIVE 02/23/2019 [place] 02/22/2019 [place] NEGATIVE 02/26/2019 1915   NITRITE NEGATIVE 02/19/2019 1915   LEUKOCYTESUR  NEGATIVE 03/02/2019 1915   Sepsis Labs: @LABRCNTIP (procalcitonin:4,lacticidven:4)  ) Recent Results (from the past 240 hour(s))  Expectorated sputum assessment w rflx to resp cult     Status: None   Collection Time: 03/12/19  2:46 PM   Specimen: Sputum  Result Value Ref Range Status   Specimen Description SPU  Final   Special Requests NONE  Final   Sputum evaluation   Final    THIS SPECIMEN IS ACCEPTABLE FOR SPUTUM CULTURE Performed at [facility], [place] [address] [place], [place] [address]    Report Status 03/12/2019 FINAL  Final  Culture, respiratory     Status: None  Collection Time: 03/12/19  2:46 PM   Specimen: Sputum  Result Value Ref Range Status   Specimen Description   Final    SPU Performed at [place] [address], [place], [place] [address]    Special Requests   Final    NONE Reflexed from [id number] Performed at [facility], [place] [address] [place], [place] [address]    Gram Stain   Final    MODERATE WBC PRESENT, PREDOMINANTLY PMN RARE GRAM POSITIVE COCCI    Culture   Final    FEW Consistent with normal respiratory flora. Performed at [facility], [place] [address], [place], [place] [address]    Report Status 03/15/2019 FINAL  Final         Radiology Studies: No results found.      Scheduled Meds: . aspirin EC  81 mg Oral Daily  . Chlorhexidine Gluconate Cloth  6 each Topical Daily  . enoxaparin (LOVENOX) injection  60 mg Subcutaneous Q24H  . fluticasone  2 spray Each Nare Daily  . guaiFENesin  1,200 mg Oral BID  . insulin aspart  0-20 Units Subcutaneous TID WC  . insulin aspart  10 Units Subcutaneous TID WC  . insulin detemir  12 Units Subcutaneous QHS  . insulin detemir  14 Units Subcutaneous Daily  . latanoprost  1 drop Both Eyes QHS  . loratadine  10 mg Oral Daily  . mouth rinse  15 mL Mouth Rinse BID  . methylPREDNISolone (SOLU-MEDROL) injection  60 mg Intravenous Q12H   . omega-3 acid ethyl esters  1 g Oral Daily  . pantoprazole  40 mg Oral Daily  . QUEtiapine  50 mg Oral QHS  . rosuvastatin  10 mg Oral Daily  . timolol  1 drop Left Eye BID  . umeclidinium-vilanterol  1 puff Inhalation Daily   Continuous Infusions:    LOS: 18 days   The patient is critically ill with multiple organ systems failure and requires high complexity decision making for assessment and support, frequent evaluation and titration of therapies, application of advanced monitoring technologies and extensive interpretation of multiple databases. Critical Care Time devoted to patient care services described in this note  Time spent: 40 minutes     Teirra Carapia, Geraldo Docker, MD [facility] Pager [phone]  If 7PM-7AM, please contact night-coverage [email or web address] Password [email or web address] 03/17/2019, 2:15 PM

## 2019-03-18 LAB — CBC WITH DIFFERENTIAL/PLATELET
Abs Immature Granulocytes: 0.06 10*3/uL (ref 0.00–0.07)
Basophils Absolute: 0 10*3/uL (ref 0.0–0.1)
Basophils Relative: 0 %
Eosinophils Absolute: 0 10*3/uL (ref 0.0–0.5)
Eosinophils Relative: 0 %
HCT: 42.1 % (ref 39.0–52.0)
Hemoglobin: 13.6 g/dL (ref 13.0–17.0)
Immature Granulocytes: 1 %
Lymphocytes Relative: 4 %
Lymphs Abs: 0.4 10*3/uL — ABNORMAL LOW (ref 0.7–4.0)
MCH: 30.7 pg (ref 26.0–34.0)
MCHC: 32.3 g/dL (ref 30.0–36.0)
MCV: 95 fL (ref 80.0–100.0)
Monocytes Absolute: 0.3 10*3/uL (ref 0.1–1.0)
Monocytes Relative: 3 %
Neutro Abs: 8.7 10*3/uL — ABNORMAL HIGH (ref 1.7–7.7)
Neutrophils Relative %: 92 %
Platelets: 106 10*3/uL — ABNORMAL LOW (ref 150–400)
RBC: 4.43 MIL/uL (ref 4.22–5.81)
RDW: 13.8 % (ref 11.5–15.5)
WBC: 9.5 10*3/uL (ref 4.0–10.5)
nRBC: 0 % (ref 0.0–0.2)

## 2019-03-18 LAB — COMPREHENSIVE METABOLIC PANEL
ALT: 55 U/L — ABNORMAL HIGH (ref 0–44)
AST: 22 U/L (ref 15–41)
Albumin: 3.2 g/dL — ABNORMAL LOW (ref 3.5–5.0)
Alkaline Phosphatase: 56 U/L (ref 38–126)
Anion gap: 9 (ref 5–15)
BUN: 29 mg/dL — ABNORMAL HIGH (ref 8–23)
CO2: 28 mmol/L (ref 22–32)
Calcium: 8.9 mg/dL (ref 8.9–10.3)
Chloride: 101 mmol/L (ref 98–111)
Creatinine, Ser: 0.84 mg/dL (ref 0.61–1.24)
GFR calc Af Amer: >60 mL/min (ref >60)
GFR calc non Af Amer: >60 mL/min (ref >60)
Glucose, Bld: 132 mg/dL — ABNORMAL HIGH (ref 70–99)
Potassium: 4.7 mmol/L (ref 3.5–5.1)
Sodium: 138 mmol/L (ref 135–145)
Total Bilirubin: 1.1 mg/dL (ref 0.3–1.2)
Total Protein: 5.8 g/dL — ABNORMAL LOW (ref 6.5–8.1)

## 2019-03-18 LAB — GLUCOSE, CAPILLARY
Glucose-Capillary: 205 mg/dL — ABNORMAL HIGH (ref 70–99)
Glucose-Capillary: 133 mg/dL — ABNORMAL HIGH (ref 70–99)
Glucose-Capillary: 171 mg/dL — ABNORMAL HIGH (ref 70–99)
Glucose-Capillary: 249 mg/dL — ABNORMAL HIGH (ref 70–99)
Glucose-Capillary: 164 mg/dL — ABNORMAL HIGH (ref 70–99)

## 2019-03-18 LAB — C-REACTIVE PROTEIN: CRP: 0.5 mg/dL (ref <1.0)

## 2019-03-18 LAB — MAGNESIUM: Magnesium: 2.4 mg/dL (ref 1.7–2.4)

## 2019-03-18 LAB — D-DIMER, QUANTITATIVE: D-Dimer, Quant: 1.48 ug/mL-FEU — ABNORMAL HIGH (ref 0.00–0.50)

## 2019-03-18 LAB — PHOSPHORUS: Phosphorus: 3.2 mg/dL (ref 2.5–4.6)

## 2019-03-18 MED ORDER — FUROSEMIDE 10 MG/ML IJ SOLN
40.0000 mg | Freq: Every day | INTRAMUSCULAR | Status: DC
  Administered 2019-03-18 – 2019-03-22 (×5): 40 mg via INTRAVENOUS
  Filled 2019-03-18 (×6): qty 4

## 2019-03-18 NOTE — Progress Notes (Signed)
Physical Therapy Treatment Patient Details Name: Justin Atkinson. MRN: [id number] DOB: July 20, 1940 Today's Date: 03/18/2019    History of Present Illness Pt is a 78 y.o. male recently admitted s/p VATS with lobe wedge resection (02/09/2019), now admitted 02/10/2019 with worsening cough and SOB; tested (+) COVID-19. Other PMH includes COPD/pulmonary fibrosis, cervical DDD, HTN, DM, HLD.    PT Comments    Patient continues on 15L HFNC with partial NRB with saturating 92% on arrival (sitting upright). With talking or few reps of exercises, he drops to low 80s and if he continues to talk will drop into mid-70s. Verbal cues for pursed lip breathing, which he does well, and sats very slowly returning to 88%. Patient with RR 20s and does not appear distressed. ?'d if nelcor finger probe was accurate and replaced with neonatal finger probe (he reports probe has not worked or stayed on his earlobe). Sats did the same as above with new probe as we continued to work on seated exercises. Patient with more coughing today (which also prolonged his sat recovery time) and required cues for STRONG cough. He tends to try to stifle his cough.    Follow Up Recommendations  CIR;Supervision for mobility/OOB     Equipment Recommendations  Rolling walker with 5" wheels;3in1 (PT)    Recommendations for Other Services Rehab consult     Precautions / Restrictions Precautions Precautions: Fall;Other (comment) Precaution Comments: 15L HFNC + 15L NRB, desats very quickly Restrictions Weight Bearing Restrictions: No    Mobility  Bed Mobility                  Transfers                    Ambulation/Gait                 Stairs             Wheelchair Mobility    Modified Rankin (Stroke Patients Only)       Balance Overall balance assessment: Needs assistance   Sitting balance-Leahy Scale: Fair                                      Cognition  Arousal/Alertness: Awake/alert Behavior During Therapy: WFL for tasks assessed/performed Overall Cognitive Status: Within Functional Limits for tasks assessed                                        Exercises General Exercises - Upper Extremity Shoulder Flexion: AROM;Both;Limitations;5 reps(alternating arms, cues to slow down) General Exercises - Lower Extremity Long Arc Quad: AROM;Both;10 reps;Seated Hip Flexion/Marching: AROM;Both;Seated(able to tolerate 2 minutes) Other Exercises Other Exercises: Incentive spirometer x 1 rep at a time x 3; Pulling 500-750 mL; produces weak cough and vc's to incr force of cough Other Exercises: Flutter valve x 1 with producing same cough    General Comments        Pertinent Vitals/Pain Pain Assessment: No/denies pain    Home Living                      Prior Function            PT Goals (current goals can now be found in the care plan section) Acute Rehab PT Goals Patient Stated Goal:  to get home to his wife Time For Goal Achievement: 03/27/19 Potential to Achieve Goals: Good Progress towards PT goals: Not progressing toward goals - comment(limited by coughing and desats easily)    Frequency    Min 3X/week      PT Plan Current plan remains appropriate    Co-evaluation              AM-PAC PT "6 Clicks" Mobility   Outcome Measure  Help needed turning from your back to your side while in a flat bed without using bedrails?: A Little Help needed moving from lying on your back to sitting on the side of a flat bed without using bedrails?: A Little Help needed moving to and from a bed to a chair (including a wheelchair)?: A Little Help needed standing up from a chair using your arms (e.g., wheelchair or bedside chair)?: A Little Help needed to walk in hospital room?: Total Help needed climbing 3-5 steps with a railing? : Total 6 Click Score: 14    End of Session Equipment Utilized During Treatment:  Oxygen Activity Tolerance: Treatment limited secondary to medical complications (Comment)(increased cough; requires frequent rest breaks due to desats) Patient left: in chair;with call bell/phone within reach   PT Visit Diagnosis: Muscle weakness (generalized) (M62.81);Difficulty in walking, not elsewhere classified (R26.2)     Time: [id number] PT Time Calculation (min) (ACUTE ONLY): 33 min  Charges:  $Therapeutic Exercise: 23-37 mins                      Arby Barrette, PT Pager [phone]    Rexanne Mano 03/18/2019, 12:30 PM

## 2019-03-18 NOTE — Progress Notes (Signed)
PROGRESS NOTE  Justin Atkinson. [id number] DOB: Sep 05, 1940 DOA: 02/12/2019 PCP: [name], MD   LOS: 19 days   Brief Narrative / Interim history: 78 year old male with COPD/pulmonary fibrosis, recently diagnosed hypersensitivity pneumonitis following VATS on 11/9, (follows with [name] as an outpatient, recently started on steroids in November), hypertension, type 2 diabetes, hyperlipidemia, was admitted to the hospital on 02/09/2019 with increased shortness of breath and profound hypoxia.  He was diagnosed with COVID-19 initially admitted to the ICU for heated high flow.  Subjective / 24h Interval events: He is feeling a lot better today than he was when he first came in.  He still short of breath but significantly improved.  He denies any chest pain, denies any abdominal pain, no nausea or vomiting.  Assessment & Plan:  Principal Problem Acute Hypoxic Respiratory Failure due to Covid-19 Viral Illness -Patient was admitted to the hospital with hypoxic respiratory failure due to COVID-19 pneumonia.  He finished a course of 5 days of remdesivir, received convalescent plasma on 11/30 as well as Actemra on 11/29. -Remains profoundly hypoxic currently requiring 15 L high flow nasal cannula maintaining O2 sats in the low 90s.   COVID-19 Labs  Recent Labs    03/16/19 0515 03/17/19 0425 03/17/19 1536 03/18/19 0120  DDIMER 2.00*  --  1.91* 1.48*  FERRITIN 393*  --   --   --   CRP 0.6 0.5 0.5 0.5    Lab Results  Component Value Date   SARSCOV2NAA POSITIVE (A) 02/09/2019   SARSCOV2NAA NOT DETECTED 02/05/2019   SARSCOV2NAA NOT DETECTED 01/09/2019    Active Problems HCAP/severe sepsis  -Resolved, completed a course of cefepime.  Monitor off antibiotics  Pulmonary fibrosis/hypersensitivity pneumonitis -Follows with pulmonology as an outpatient, currently maintained on steroids will likely need a very prolonged steroid taper  Acute on chronic diastolic CHF -No 2D echo in  the system as well he had a stress test in November 2020 with an EF of 52%, patient does have evidence of fluid overload today with pitting lower extremity edema, will start IV Lasix  Essential hypertension -His blood pressure medications were on hold, now placed on Lasix, monitor  Type 2 diabetes mellitus -Continue Levemir, sliding scale  CBG (last 3)  Recent Labs    03/17/19 2012 03/18/19 0801 03/18/19 1124  GLUCAP 205* 133* 171*   Hyperlipidemia -continue Crestor  Scheduled Meds: . aspirin EC  81 mg Oral Daily  . Chlorhexidine Gluconate Cloth  6 each Topical Daily  . enoxaparin (LOVENOX) injection  60 mg Subcutaneous Q24H  . fluticasone  2 spray Each Nare Daily  . furosemide  40 mg Intravenous Daily  . guaiFENesin  1,200 mg Oral BID  . insulin aspart  0-20 Units Subcutaneous TID WC  . insulin aspart  10 Units Subcutaneous TID WC  . insulin detemir  12 Units Subcutaneous QHS  . insulin detemir  14 Units Subcutaneous Daily  . latanoprost  1 drop Both Eyes QHS  . loratadine  10 mg Oral Daily  . mouth rinse  15 mL Mouth Rinse BID  . methylPREDNISolone (SOLU-MEDROL) injection  60 mg Intravenous Q12H  . omega-3 acid ethyl esters  1 g Oral Daily  . pantoprazole  40 mg Oral Daily  . QUEtiapine  50 mg Oral QHS  . rosuvastatin  10 mg Oral Daily  . timolol  1 drop Left Eye BID  . umeclidinium-vilanterol  1 puff Inhalation Daily   Continuous Infusions: PRN Meds:.acetaminophen, albuterol, ALPRAZolam, haloperidol  lactate, morphine injection, ondansetron (ZOFRAN) IV, polyethylene glycol, sodium chloride  DVT prophylaxis: Lovenox Code Status: Full code Family Communication: We will call daughter later Disposition Plan: Likely home when ready  Consultants:  None  Procedures:  None   Microbiology: Respiratory culture 03/12/2019-rare GPC's, consistent with normal respiratory flora Blood culture 11/27-no growth  Antimicrobials: Vancomycin 11/27-11/28 Cefepime  11/27-12/1  Objective: Vitals:   03/18/19 0440 03/18/19 0900 03/18/19 0920 03/18/19 1000  BP: 137/76  (!) 145/70   Pulse: 61  67 62  Resp: 18  20   Temp: 97.7 F (36.5 C) 97.6 F (36.4 C)    TempSrc: Axillary Axillary    SpO2: 92%  92% 97%  Weight:      Height:        Intake/Output Summary (Last 24 hours) at 03/18/2019 1204 Last data filed at 03/18/2019 0900 Gross per 24 hour  Intake 600 ml  Output 1200 ml  Net -600 ml   Filed Weights   02/14/2019 1810 02/28/19 0812 03/06/19 1607  Weight: 130.7 kg 130.7 kg 124.2 kg    Examination:  Constitutional: NAD Eyes: no scleral icterus ENMT: Mucous membranes are moist.  Neck: normal, supple Respiratory: Diminished at the bases, Velcro type sounds at the bases, no wheezing, no crackles Cardiovascular: Regular rate and rhythm, no murmurs / rubs / gallops.  2+ pitting LE edema. Good peripheral pulses Abdomen: non distended, no tenderness. Bowel sounds positive.  Musculoskeletal: no clubbing / cyanosis.  Skin: no rashes Neurologic: CN 2-12 grossly intact. Strength 5/5 in all 4.  Psychiatric: Normal judgment and insight. Alert and oriented x 3. Normal mood.    Data Reviewed: I have independently reviewed following labs and imaging studies   CBC: Recent Labs  Lab 03/14/19 0336 03/15/19 0059 03/16/19 0515 03/17/19 1536 03/18/19 0120  WBC 11.8* 10.4 8.7 11.5* 9.5  NEUTROABS 10.0* 8.7* 6.8 10.8* 8.7*  HGB 14.3 13.9 13.5 14.5 13.6  HCT 44.1 43.5 42.3 44.7 42.1  MCV 95.2 96.5 95.3 95.5 95.0  PLT 143* 141* 110* 117* [id number]*   Basic Metabolic Panel: Recent Labs  Lab 03/14/19 0336 03/15/19 0059 03/16/19 0515 03/17/19 1536 03/18/19 0120  NA 138 138 139 137 138  K 4.5 4.5 4.6 4.8 4.7  CL 98 97* 100 98 101  CO2 31 32 30 29 28   GLUCOSE 102* 95 91 198* 132*  BUN 35* 32* 31* 31* 29*  CREATININE 0.93 0.95 1.00 0.86 0.84  CALCIUM 9.3 9.0 8.8* 9.2 8.9  MG 2.3 2.3 2.3 2.3 2.4  PHOS 3.4 3.5 3.5 3.7 3.2   GFR: Estimated  Creatinine Clearance: 103.3 mL/min (by C-G formula based on SCr of 0.84 mg/dL). Liver Function Tests: Recent Labs  Lab 03/14/19 0336 03/15/19 0059 03/16/19 0515 03/17/19 1536 03/18/19 0120  AST 24 23 23 29 22   ALT 61* 50* 52* 64* 55*  ALKPHOS 72 69 60 64 56  BILITOT 0.8 0.7 0.7 0.9 1.1  PROT 5.9* 5.9* 5.6* 6.5 5.8*  ALBUMIN 3.1* 3.2* 3.1* 3.7 3.2*   No results for input(s): LIPASE, AMYLASE in the last 168 hours. No results for input(s): AMMONIA in the last 168 hours. Coagulation Profile: No results for input(s): INR, PROTIME in the last 168 hours. Cardiac Enzymes: No results for input(s): CKTOTAL, CKMB, CKMBINDEX, TROPONINI in the last 168 hours. BNP (last 3 results) No results for input(s): PROBNP in the last 8760 hours. HbA1C: No results for input(s): HGBA1C in the last 72 hours. CBG: Recent Labs  Lab 03/17/19 1206  03/17/19 1625 03/17/19 2012 03/18/19 0801 03/18/19 1124  GLUCAP 195* 182* 205* 133* 171*   Lipid Profile: No results for input(s): CHOL, HDL, LDLCALC, TRIG, CHOLHDL, LDLDIRECT in the last 72 hours. Thyroid Function Tests: No results for input(s): TSH, T4TOTAL, FREET4, T3FREE, THYROIDAB in the last 72 hours. Anemia Panel: Recent Labs    03/16/19 0515  FERRITIN 393*   Urine analysis:    Component Value Date/Time   COLORURINE YELLOW 02/20/2019 1915   APPEARANCEUR CLEAR 02/14/2019 1915   LABSPEC >1.046 (H) 02/07/2019 1915   PHURINE 6.0 03/02/2019 1915   GLUCOSEU NEGATIVE 02/26/2019 [place] NEGATIVE 02/10/2019 1915   [place] NEGATIVE 02/10/2019 [place] 02/12/2019 [place] NEGATIVE 02/05/2019 1915   NITRITE NEGATIVE 02/05/2019 [place] NEGATIVE 02/06/2019 1915   Sepsis Labs: Invalid input(s): PROCALCITONIN, LACTICIDVEN  Recent Results (from the past 240 hour(s))  Expectorated sputum assessment w rflx to resp cult     Status: None   Collection Time: 03/12/19  2:46 PM   Specimen: Sputum  Result  Value Ref Range Status   Specimen Description SPU  Final   Special Requests NONE  Final   Sputum evaluation   Final    THIS SPECIMEN IS ACCEPTABLE FOR SPUTUM CULTURE Performed at [facility], [place] [address], [place], [place] [address]    Report Status 03/12/2019 FINAL  Final  Culture, respiratory     Status: None   Collection Time: 03/12/19  2:46 PM   Specimen: Sputum  Result Value Ref Range Status   Specimen Description   Final    SPU Performed at [facility] [address] [place], [place] [address]    Special Requests   Final    NONE Reflexed from [id number] Performed at [facility], [place] [address], [place], [place] [address]    Gram Stain   Final    MODERATE WBC PRESENT, PREDOMINANTLY PMN RARE GRAM POSITIVE COCCI    Culture   Final    FEW Consistent with normal respiratory flora. Performed at [facility], [place] [address] [place],  [address]    Report Status 03/15/2019 FINAL  Final      Radiology Studies: No results found.   Marzetta Board, MD, PhD [facility]  Contact via  [email or web address]  [place] P: [phone] F: [phone]

## 2019-03-18 NOTE — Plan of Care (Signed)
Alert and oriented 4x, continues on 15 liters hiflow [place] with most periods of 100% non rebreather. Easily desaturates to low 70's when non rebreather off to eat. Activities such as standing not well tolerated. Skin is cool and dry, breath sounds diminished. No fall or injury, up in recliner most of the day, diet well tolerated. Lasix 40 mg IV given, will wait for results. Good sense of humor and conversational.  Problem: Respiratory: Goal: Will maintain a patent airway Outcome: Progressing Goal: Complications related to the disease process, condition or treatment will be avoided or minimized Outcome: Progressing   Problem: Safety: Goal: Ability to remain free from injury will improve Outcome: Progressing   Problem: Nutrition: Goal: Adequate nutrition will be maintained Outcome: Progressing   Problem: Activity: Goal: Risk for activity intolerance will decrease Outcome: Progressing   Problem: Clinical Measurements: Goal: Cardiovascular complication will be avoided Outcome: Progressing

## 2019-03-19 ENCOUNTER — Inpatient Hospital Stay (HOSPITAL_COMMUNITY): Payer: Medicare Other

## 2019-03-19 DIAGNOSIS — U071 COVID-19: Secondary | ICD-10-CM

## 2019-03-19 DIAGNOSIS — R609 Edema, unspecified: Secondary | ICD-10-CM

## 2019-03-19 LAB — GLUCOSE, CAPILLARY
Glucose-Capillary: 119 mg/dL — ABNORMAL HIGH (ref 70–99)
Glucose-Capillary: 196 mg/dL — ABNORMAL HIGH (ref 70–99)
Glucose-Capillary: 229 mg/dL — ABNORMAL HIGH (ref 70–99)

## 2019-03-19 LAB — COMPREHENSIVE METABOLIC PANEL
ALT: 50 U/L — ABNORMAL HIGH (ref 0–44)
AST: 18 U/L (ref 15–41)
Albumin: 3.2 g/dL — ABNORMAL LOW (ref 3.5–5.0)
Alkaline Phosphatase: 54 U/L (ref 38–126)
Anion gap: 12 (ref 5–15)
BUN: 33 mg/dL — ABNORMAL HIGH (ref 8–23)
CO2: 30 mmol/L (ref 22–32)
Calcium: 9.1 mg/dL (ref 8.9–10.3)
Chloride: 96 mmol/L — ABNORMAL LOW (ref 98–111)
Creatinine, Ser: 0.86 mg/dL (ref 0.61–1.24)
GFR calc Af Amer: >60 mL/min (ref >60)
GFR calc non Af Amer: >60 mL/min (ref >60)
Glucose, Bld: 114 mg/dL — ABNORMAL HIGH (ref 70–99)
Potassium: 4.6 mmol/L (ref 3.5–5.1)
Sodium: 138 mmol/L (ref 135–145)
Total Bilirubin: 0.6 mg/dL (ref 0.3–1.2)
Total Protein: 5.9 g/dL — ABNORMAL LOW (ref 6.5–8.1)

## 2019-03-19 LAB — CBC WITH DIFFERENTIAL/PLATELET
Abs Immature Granulocytes: 0.04 10*3/uL (ref 0.00–0.07)
Basophils Absolute: 0 10*3/uL (ref 0.0–0.1)
Basophils Relative: 0 %
Eosinophils Absolute: 0 10*3/uL (ref 0.0–0.5)
Eosinophils Relative: 0 %
HCT: 43.4 % (ref 39.0–52.0)
Hemoglobin: 14 g/dL (ref 13.0–17.0)
Immature Granulocytes: 0 %
Lymphocytes Relative: 3 %
Lymphs Abs: 0.3 10*3/uL — ABNORMAL LOW (ref 0.7–4.0)
MCH: 30.8 pg (ref 26.0–34.0)
MCHC: 32.3 g/dL (ref 30.0–36.0)
MCV: 95.4 fL (ref 80.0–100.0)
Monocytes Absolute: 0.4 10*3/uL (ref 0.1–1.0)
Monocytes Relative: 4 %
Neutro Abs: 9.3 10*3/uL — ABNORMAL HIGH (ref 1.7–7.7)
Neutrophils Relative %: 93 %
Platelets: 120 10*3/uL — ABNORMAL LOW (ref 150–400)
RBC: 4.55 MIL/uL (ref 4.22–5.81)
RDW: 14 % (ref 11.5–15.5)
WBC: 10.1 10*3/uL (ref 4.0–10.5)
nRBC: 0 % (ref 0.0–0.2)

## 2019-03-19 LAB — D-DIMER, QUANTITATIVE: D-Dimer, Quant: 1.6 ug/mL-FEU — ABNORMAL HIGH (ref 0.00–0.50)

## 2019-03-19 LAB — MAGNESIUM: Magnesium: 2.3 mg/dL (ref 1.7–2.4)

## 2019-03-19 LAB — C-REACTIVE PROTEIN: CRP: <0.5 mg/dL (ref <1.0)

## 2019-03-19 LAB — PHOSPHORUS: Phosphorus: 4 mg/dL (ref 2.5–4.6)

## 2019-03-19 NOTE — Progress Notes (Signed)
Bilateral lower extremity venous duplex has been completed. Preliminary results can be found in CV Proc through chart review.  Results were given to the patient's nurse, Minette Brine.  03/19/19 3:29 PM Carlos Levering RVT

## 2019-03-19 NOTE — Progress Notes (Signed)
Upon entry to pt room, pt was on cell phone with family. Pt updated family on pt progress. No questions at this time.

## 2019-03-19 NOTE — Progress Notes (Signed)
Physical Therapy Treatment Patient Details Name: Justin Atkinson. MRN: [id number] DOB: 17-May-1940 Today's Date: 03/19/2019    History of Present Illness Pt is a 78 y.o. male recently admitted s/p VATS with lobe wedge resection (02/09/2019), now admitted 02/03/2019 with worsening cough and SOB; tested (+) COVID-19. Other PMH includes COPD/pulmonary fibrosis, cervical DDD, HTN, DM, HLD.    PT Comments    Patient in the middle of bathing with nursing assist on arrival; sitting and recovering due to sats dropped to 70% (earlobe, good waveform). After pt recovered, assisted RN by standing patient for cleaning peri-area. Patient strong coming to standing, however becomes limited in standing time due to back pain and decreasing respiratory status. Required >5 minutes to recover to allow second stand for completing donning underwear and pivot to recliner. Prolonged seated rest again, then finally sit to stand 3rd time for increased strengthening and endurance. Will need +2-3 to safely ambulate while using HFNC and partial NRB.    Follow Up Recommendations  CIR;Supervision for mobility/OOB     Equipment Recommendations  Rolling walker with 5" wheels;3in1 (PT)    Recommendations for Other Services Rehab consult     Precautions / Restrictions Precautions Precautions: Fall;Other (comment) Precaution Comments: 15L HFNC + 15L NRB, desats very quickly Restrictions Weight Bearing Restrictions: No    Mobility  Bed Mobility                  Transfers Overall transfer level: Needs assistance Equipment used: Rolling walker (2 wheeled);None Transfers: Sit to/from [employer] to Stand: Min guard Stand pivot transfers: Min assist       General transfer comment: Sit/stand x 3 with min guard and use of RW vs shelf-arm technique for support. No instances of LOB. Max time standing 1 minute 10 sec.  Ambulation/Gait             General Gait Details: unable due to  decr sats; requiring both HFNC and PNRB; pre-gait activities   Stairs             Wheelchair Mobility    Modified Rankin (Stroke Patients Only)       Balance Overall balance assessment: Needs assistance   Sitting balance-Leahy Scale: Fair       Standing balance-Leahy Scale: Fair                              Cognition Arousal/Alertness: Awake/alert Behavior During Therapy: WFL for tasks assessed/performed Overall Cognitive Status: Within Functional Limits for tasks assessed                                        Exercises      General Comments General comments (skin integrity, edema, etc.): Patient bathing with RN on arrival. RN requesting assist to stand patient for per-care. Pt required 15L HFNC and partial NRB with sats decr to 77% as he fatigued. Returned to 85% in 4 minutes, however pt begins talking and drops again. Eventually recovers to 92%.      Pertinent Vitals/Pain Pain Assessment: No/denies pain Faces Pain Scale: No hurt    Home Living Family/patient expects to be discharged to:: Private residence Living Arrangements: Spouse/significant other Available Help at Discharge: Family;Personal care attendant;Available PRN/intermittently Type of Home: House Home Access: Level entry   Home Layout: Two level;Able to live on main  level with bedroom/bathroom Home Equipment: None(wife has equipment) Additional Comments: Wife has MS and has PCA to assist her with BADLs and transfers    Prior Function Level of Independence: Independent      Comments: pt limited community ambulator 2/2 back pain from DDD   PT Goals (current goals can now be found in the care plan section) Acute Rehab PT Goals Patient Stated Goal: to get home to his wife Time For Goal Achievement: 03/27/19 Potential to Achieve Goals: Good Progress towards PT goals: Progressing toward goals    Frequency    Min 3X/week      PT Plan      Co-evaluation               AM-PAC PT "6 Clicks" Mobility   Outcome Measure  Help needed turning from your back to your side while in a flat bed without using bedrails?: A Little Help needed moving from lying on your back to sitting on the side of a flat bed without using bedrails?: A Little Help needed moving to and from a bed to a chair (including a wheelchair)?: A Little Help needed standing up from a chair using your arms (e.g., wheelchair or bedside chair)?: A Little Help needed to walk in hospital room?: Total Help needed climbing 3-5 steps with a railing? : Total 6 Click Score: 14    End of Session Equipment Utilized During Treatment: Oxygen Activity Tolerance: Treatment limited secondary to medical complications (Comment)(decr sats) Patient left: in chair;with call bell/phone within reach   PT Visit Diagnosis: Muscle weakness (generalized) (M62.81);Difficulty in walking, not elsewhere classified (R26.2)     Time: [id number] PT Time Calculation (min) (ACUTE ONLY): 29 min  Charges:  $Gait Training: 8-22 mins $Self Care/Home Management: 8-22                      Arby Barrette, PT Pager [phone]    Rexanne Mano 03/19/2019, 6:13 PM

## 2019-03-19 NOTE — Plan of Care (Signed)

## 2019-03-19 NOTE — Progress Notes (Signed)
PROGRESS NOTE  Justin Atkinson. [id number] DOB: 28-May-1940 DOA: 02/19/2019 PCP: [name], MD   LOS: 20 days   Brief Narrative / Interim history: 78 year old male with COPD/pulmonary fibrosis, recently diagnosed hypersensitivity pneumonitis following VATS on 11/9, (follows with [name] as an outpatient, recently started on steroids in November), hypertension, type 2 diabetes, hyperlipidemia, was admitted to the hospital on 02/11/2019 with increased shortness of breath and profound hypoxia.  He was diagnosed with COVID-19 initially admitted to the ICU for heated high flow.  Subjective / 24h Interval events: Feeling better, appreciates continued improvement, thinks Lasix made a difference yesterday  Assessment & Plan:  Principal Problem Acute Hypoxic Respiratory Failure due to Covid-19 Viral Illness -Patient was admitted to the hospital with hypoxic respiratory failure due to COVID-19 pneumonia.  He finished a course of 5 days of remdesivir, received convalescent plasma on 11/30 as well as Actemra on 11/29. -Remains profoundly hypoxic still requiring 15 L via facemask, but satting 100% and anticipate that he will be able to be weaned down. -Maintain negative fluid balance, continue Lasix   COVID-19 Labs  Recent Labs    03/17/19 1536 03/18/19 0120 03/19/19 0250  DDIMER 1.91* 1.48* 1.60*  CRP 0.5 0.5 <0.5    Lab Results  Component Value Date   SARSCOV2NAA POSITIVE (A) 02/07/2019   SARSCOV2NAA NOT DETECTED 02/05/2019   SARSCOV2NAA NOT DETECTED 01/09/2019    Active Problems HCAP/severe sepsis  -Resolved, completed a course of cefepime.  Monitor off antibiotics  Pulmonary fibrosis/hypersensitivity pneumonitis -Follows with pulmonology as an outpatient, currently maintained on steroids will likely need a very prolonged steroid taper  Lower extremity swelling -Patient's appreciates his left leg is more swollen than the right, will obtain venous Doppler -Continue  Lasix as above  Acute on chronic diastolic CHF -No 2D echo in the system as well he had a stress test in November 2020 with an EF of 52%, patient does have evidence of fluid overload today with pitting lower extremity edema, continue IV Lasix, renal function is stable  Essential hypertension -His blood pressure medications were on hold, now placed on Lasix, monitor  Type 2 diabetes mellitus -Continue Levemir, sliding scale, CBGs fair  CBG (last 3)  Recent Labs    03/18/19 2037 03/19/19 0757 03/19/19 1210  GLUCAP 164* 119* 196*   Hyperlipidemia -continue Crestor  Scheduled Meds: . aspirin EC  81 mg Oral Daily  . Chlorhexidine Gluconate Cloth  6 each Topical Daily  . enoxaparin (LOVENOX) injection  60 mg Subcutaneous Q24H  . fluticasone  2 spray Each Nare Daily  . furosemide  40 mg Intravenous Daily  . guaiFENesin  1,200 mg Oral BID  . insulin aspart  0-20 Units Subcutaneous TID WC  . insulin aspart  10 Units Subcutaneous TID WC  . insulin detemir  12 Units Subcutaneous QHS  . insulin detemir  14 Units Subcutaneous Daily  . latanoprost  1 drop Both Eyes QHS  . loratadine  10 mg Oral Daily  . mouth rinse  15 mL Mouth Rinse BID  . methylPREDNISolone (SOLU-MEDROL) injection  60 mg Intravenous Q12H  . omega-3 acid ethyl esters  1 g Oral Daily  . pantoprazole  40 mg Oral Daily  . QUEtiapine  50 mg Oral QHS  . rosuvastatin  10 mg Oral Daily  . timolol  1 drop Left Eye BID  . umeclidinium-vilanterol  1 puff Inhalation Daily   Continuous Infusions: PRN Meds:.acetaminophen, albuterol, ALPRAZolam, haloperidol lactate, morphine injection, ondansetron (ZOFRAN) IV,  polyethylene glycol, sodium chloride  DVT prophylaxis: Lovenox Code Status: Full code Family Communication: We will call daughter later Disposition Plan: Likely home when ready  Consultants:  None  Procedures:  None   Microbiology: Respiratory culture 03/12/2019-rare GPC's, consistent with normal respiratory  flora Blood culture 11/27-no growth  Antimicrobials: Vancomycin 11/27-11/28 Cefepime 11/27-12/1  Objective: Vitals:   03/19/19 0030 03/19/19 0515 03/19/19 0755 03/19/19 1229  BP: 129/65 (!) 144/67 (!) 143/73 (!) 137/99  Pulse: 66 70 62 69  Resp: 18 20 14 19   Temp: 97.7 F (36.5 C) 98 F (36.7 C) 97.6 F (36.4 C) 98.9 F (37.2 C)  TempSrc: Axillary Axillary Oral Axillary  SpO2: 92% (!) 82% 92% 100%  Weight:      Height:        Intake/Output Summary (Last 24 hours) at 03/19/2019 1452 Last data filed at 03/19/2019 1213 Gross per 24 hour  Intake 920 ml  Output 2950 ml  Net -2030 ml   Filed Weights   02/25/2019 1810 02/28/19 0812 03/06/19 1607  Weight: 130.7 kg 130.7 kg 124.2 kg    Examination:  Constitutional: NAD Eyes: No icterus ENMT: Moist mucous membranes Neck: normal, supple Respiratory: Velcro type sounds at the bases, no wheezing, no crackles Cardiovascular: Regular rate and rhythm, no murmurs, 1+ pitting lower extremity edema Abdomen: Soft, NT, ND, positive bowel sounds Musculoskeletal: no clubbing / cyanosis.  Skin: No rashes seen Neurologic: No focal deficits, equal strength Psychiatric: Normal judgment and insight. Alert and oriented x 3. Normal mood.    Data Reviewed: I have independently reviewed following labs and imaging studies   CBC: Recent Labs  Lab 03/15/19 0059 03/16/19 0515 03/17/19 1536 03/18/19 0120 03/19/19 0250  WBC 10.4 8.7 11.5* 9.5 10.1  NEUTROABS 8.7* 6.8 10.8* 8.7* 9.3*  HGB 13.9 13.5 14.5 13.6 14.0  HCT 43.5 42.3 44.7 42.1 43.4  MCV 96.5 95.3 95.5 95.0 95.4  PLT 141* 110* 117* 106* [id number]*   Basic Metabolic Panel: Recent Labs  Lab 03/15/19 0059 03/16/19 0515 03/17/19 1536 03/18/19 0120 03/19/19 0250  NA 138 139 137 138 138  K 4.5 4.6 4.8 4.7 4.6  CL 97* 100 98 101 96*  CO2 32 30 29 28 30   GLUCOSE 95 91 198* 132* 114*  BUN 32* 31* 31* 29* 33*  CREATININE 0.95 1.00 0.86 0.84 0.86  CALCIUM 9.0 8.8* 9.2 8.9 9.1  MG  2.3 2.3 2.3 2.4 2.3  PHOS 3.5 3.5 3.7 3.2 4.0   GFR: Estimated Creatinine Clearance: 100.9 mL/min (by C-G formula based on SCr of 0.86 mg/dL). Liver Function Tests: Recent Labs  Lab 03/15/19 0059 03/16/19 0515 03/17/19 1536 03/18/19 0120 03/19/19 0250  AST 23 23 29 22 18   ALT 50* 52* 64* 55* 50*  ALKPHOS 69 60 64 56 54  BILITOT 0.7 0.7 0.9 1.1 0.6  PROT 5.9* 5.6* 6.5 5.8* 5.9*  ALBUMIN 3.2* 3.1* 3.7 3.2* 3.2*   No results for input(s): LIPASE, AMYLASE in the last 168 hours. No results for input(s): AMMONIA in the last 168 hours. Coagulation Profile: No results for input(s): INR, PROTIME in the last 168 hours. Cardiac Enzymes: No results for input(s): CKTOTAL, CKMB, CKMBINDEX, TROPONINI in the last 168 hours. BNP (last 3 results) No results for input(s): PROBNP in the last 8760 hours. HbA1C: No results for input(s): HGBA1C in the last 72 hours. CBG: Recent Labs  Lab 03/18/19 1124 03/18/19 1628 03/18/19 2037 03/19/19 0757 03/19/19 1210  GLUCAP 171* 249* 164* 119* 196*  Lipid Profile: No results for input(s): CHOL, HDL, LDLCALC, TRIG, CHOLHDL, LDLDIRECT in the last 72 hours. Thyroid Function Tests: No results for input(s): TSH, T4TOTAL, FREET4, T3FREE, THYROIDAB in the last 72 hours. Anemia Panel: No results for input(s): VITAMINB12, FOLATE, FERRITIN, TIBC, IRON, RETICCTPCT in the last 72 hours. Urine analysis:    Component Value Date/Time   COLORURINE YELLOW 02/08/2019 1915   APPEARANCEUR CLEAR 02/07/2019 1915   LABSPEC >1.046 (H) 02/01/2019 1915   PHURINE 6.0 02/21/2019 1915   GLUCOSEU NEGATIVE 02/03/2019 1915   HGBUR NEGATIVE 02/03/2019 1915   [place] NEGATIVE 02/03/2019 [place] NEGATIVE 02/10/2019 1915   PROTEINUR NEGATIVE 03/01/2019 1915   NITRITE NEGATIVE 02/10/2019 1915   LEUKOCYTESUR NEGATIVE 02/19/2019 1915   Sepsis Labs: Invalid input(s): PROCALCITONIN, LACTICIDVEN  Recent Results (from the past 240 hour(s))  Expectorated sputum  assessment w rflx to resp cult     Status: None   Collection Time: 03/12/19  2:46 PM   Specimen: Sputum  Result Value Ref Range Status   Specimen Description SPU  Final   Special Requests NONE  Final   Sputum evaluation   Final    THIS SPECIMEN IS ACCEPTABLE FOR SPUTUM CULTURE Performed at [facility], [place] [address] [place], [place] [address]    Report Status 03/12/2019 FINAL  Final  Culture, respiratory     Status: None   Collection Time: 03/12/19  2:46 PM   Specimen: Sputum  Result Value Ref Range Status   Specimen Description   Final    SPU Performed at [place] [address] [place], [place] [address]    Special Requests   Final    NONE Reflexed from [id number] Performed at [facility], [place] [address] [place], [place] [address]    Gram Stain   Final    MODERATE WBC PRESENT, PREDOMINANTLY PMN RARE GRAM POSITIVE COCCI    Culture   Final    FEW Consistent with normal respiratory flora. Performed at [facility], [place] [address], [place], [place] [address]    Report Status 03/15/2019 FINAL  Final      Radiology Studies: No results found.   Marzetta Board, MD, PhD [facility]  Contact via  [email or web address]  [place] P: [phone] F: [phone]

## 2019-03-19 NOTE — Progress Notes (Signed)
Inpatient Rehabilitation Admissions Coordinator  Inpatient rehab consult received. Post COVID + 11/27. Noted on 15 liters hi flow [place] with periods on nonrebreather. I will follow his progress to assess tolerance for more intense therapies that can be provided at CIR/inpt rehab. Not yet at that level.  Danne Baxter, RN, MSN Rehab Admissions Coordinator [phone] 03/19/2019 3:41 PM

## 2019-03-20 LAB — PHOSPHORUS: Phosphorus: 3.8 mg/dL (ref 2.5–4.6)

## 2019-03-20 LAB — CBC WITH DIFFERENTIAL/PLATELET
Abs Immature Granulocytes: 0.03 10*3/uL (ref 0.00–0.07)
Basophils Absolute: 0 10*3/uL (ref 0.0–0.1)
Basophils Relative: 0 %
Eosinophils Absolute: 0 10*3/uL (ref 0.0–0.5)
Eosinophils Relative: 0 %
HCT: 43.8 % (ref 39.0–52.0)
Hemoglobin: 14.1 g/dL (ref 13.0–17.0)
Immature Granulocytes: 0 %
Lymphocytes Relative: 3 %
Lymphs Abs: 0.4 10*3/uL — ABNORMAL LOW (ref 0.7–4.0)
MCH: 30.3 pg (ref 26.0–34.0)
MCHC: 32.2 g/dL (ref 30.0–36.0)
MCV: 94.2 fL (ref 80.0–100.0)
Monocytes Absolute: 0.8 10*3/uL (ref 0.1–1.0)
Monocytes Relative: 7 %
Neutro Abs: 9.6 10*3/uL — ABNORMAL HIGH (ref 1.7–7.7)
Neutrophils Relative %: 90 %
Platelets: 108 10*3/uL — ABNORMAL LOW (ref 150–400)
RBC: 4.65 MIL/uL (ref 4.22–5.81)
RDW: 13.9 % (ref 11.5–15.5)
WBC: 10.8 10*3/uL — ABNORMAL HIGH (ref 4.0–10.5)
nRBC: 0 % (ref 0.0–0.2)

## 2019-03-20 LAB — COMPREHENSIVE METABOLIC PANEL
ALT: 52 U/L — ABNORMAL HIGH (ref 0–44)
AST: 20 U/L (ref 15–41)
Albumin: 3.4 g/dL — ABNORMAL LOW (ref 3.5–5.0)
Alkaline Phosphatase: 59 U/L (ref 38–126)
Anion gap: 12 (ref 5–15)
BUN: 40 mg/dL — ABNORMAL HIGH (ref 8–23)
CO2: 29 mmol/L (ref 22–32)
Calcium: 9.1 mg/dL (ref 8.9–10.3)
Chloride: 96 mmol/L — ABNORMAL LOW (ref 98–111)
Creatinine, Ser: 0.96 mg/dL (ref 0.61–1.24)
GFR calc Af Amer: >60 mL/min (ref >60)
GFR calc non Af Amer: >60 mL/min (ref >60)
Glucose, Bld: 118 mg/dL — ABNORMAL HIGH (ref 70–99)
Potassium: 4 mmol/L (ref 3.5–5.1)
Sodium: 137 mmol/L (ref 135–145)
Total Bilirubin: 1.1 mg/dL (ref 0.3–1.2)
Total Protein: 6.1 g/dL — ABNORMAL LOW (ref 6.5–8.1)

## 2019-03-20 LAB — GLUCOSE, CAPILLARY
Glucose-Capillary: 254 mg/dL — ABNORMAL HIGH (ref 70–99)
Glucose-Capillary: 160 mg/dL — ABNORMAL HIGH (ref 70–99)
Glucose-Capillary: 241 mg/dL — ABNORMAL HIGH (ref 70–99)
Glucose-Capillary: 265 mg/dL — ABNORMAL HIGH (ref 70–99)

## 2019-03-20 LAB — MAGNESIUM: Magnesium: 2.3 mg/dL (ref 1.7–2.4)

## 2019-03-20 LAB — D-DIMER, QUANTITATIVE: D-Dimer, Quant: 1.29 ug/mL-FEU — ABNORMAL HIGH (ref 0.00–0.50)

## 2019-03-20 LAB — C-REACTIVE PROTEIN: CRP: 0.6 mg/dL (ref <1.0)

## 2019-03-20 NOTE — Progress Notes (Signed)
Called pt's daughter, [name], and updated her to the events of the day (notably, the pt's ability to titrate O2 down to 11L). She did not have any questions, but was appreciative of the update.

## 2019-03-20 NOTE — Progress Notes (Signed)
At approximately 0015, pt sats noted to be dropping. Primary nurse entered pt room. Pt had nonrebreather off. PT educated on importance of leaving in on. Pt continued to remove O2 device. Nurse continued to provide education and pt became compliant with O2. Will continue to monitor.

## 2019-03-20 NOTE — Progress Notes (Signed)
MEWS yellow 2/2 increased RR r/t O2 titration. Agricultural consultant and provider aware.  Vital Signs MEWS/VS Documentation      03/20/2019 0900 03/20/2019 1000 03/20/2019 1100 03/20/2019 1200   MEWS Score:  1  1  1  2    MEWS Score Color:  Green  Green  Green  Yellow   Resp:  (!) 23  (!) 23  (!) 22  (!) 31   Pulse:  69  67  (!) 129  65   BP:  --  --  --  116/79   O2 Device:  HFNC;Non-rebreather Mask  HFNC;Non-rebreather Mask  HFNC;Non-rebreather Mask  HFNC;Non-rebreather Mask   O2 Flow Rate (L/min):  15 L/min  15 L/min  14 L/min  12 L/min   Level of Consciousness:  Alert  Alert  Alert  Alert           Angelia Mould 03/20/2019,12:39 PM

## 2019-03-20 NOTE — Progress Notes (Addendum)
PROGRESS NOTE    Justin Atkinson.  [id number] DOB: 05/13/1940 DOA: 03/02/2019 PCP: [name], MD  Brief Narrative:  78 year old male with COPD/pulmonary fibrosis, recently diagnosed hypersensitivity pneumonitis following VATS on 11/9, (follows with [name] as an outpatient, recently started on steroids in November), hypertension, type 2 diabetes, hyperlipidemia, was admitted to the hospital on 02/08/2019 with increased shortness of breath and profound hypoxia.  He was diagnosed with COVID-19 initially admitted to the ICU for heated high flow.  Assessment & Plan:   Principal Problem:   Acute respiratory failure with hypoxia (HCC) Active Problems:   Hypertension   Hypercholesterolemia   Benign prostate hyperplasia   ILD (interstitial lung disease) (HCC)   Postinflammatory pulmonary fibrosis (HCC)   HCAP (healthcare-associated pneumonia)   Chronic obstructive pulmonary disease (HCC)   Pneumonia due to COVID-19 virus   Severe sepsis (HCC)   Essential hypertension   Diabetes mellitus type 2, controlled, with complications ([place])   Pulmonary fibrosis (HCC)   Hypersensitivity pneumonitis (HCC)   Acute Hypoxic Respiratory Failure due to Covid-19 Viral Illness -Patient was admitted to the hospital with hypoxic respiratory failure due to COVID-19 pneumonia.  He finished Abygail Galeno course of 5 days of remdesivir, received convalescent plasma on 11/30 as well as Actemra on 11/29. -Remains profoundly hypoxic still requiring 15 L [place] and NRB -continue steroids, will need taper -Maintain negative fluid balance, continue Lasix (currently ordered daily) -OOB, IS, prone as able  COVID-19 Labs  Recent Labs    03/18/19 0120 03/19/19 0250 03/20/19 0024  DDIMER 1.48* 1.60* 1.29*  CRP 0.5 <0.5 0.6    Lab Results  Component Value Date   SARSCOV2NAA POSITIVE (Carly Applegate) 02/19/2019   SARSCOV2NAA NOT DETECTED 02/05/2019   [place] NOT DETECTED 01/09/2019   HCAP/severe sepsis  -Resolved,  completed Melissa Pulido course of cefepime.  Monitor off antibiotics  Pulmonary fibrosis/hypersensitivity pneumonitis -Follows with pulmonology as an outpatient, currently maintained on steroids will likely need Jaquitta Dupriest very prolonged steroid taper  Lower extremity swelling -Patient's appreciates his left leg is more swollen than the right, will obtain venous Doppler (prelim negative) -Continue Lasix as above  Acute on chronic diastolic CHF -No 2D echo in the system as well he had Amor Packard stress test in November 2020 with an EF of 52%, patient does have evidence of fluid overload today with pitting lower extremity edema, continue IV Lasix, renal function is stable  Essential hypertension -His blood pressure medications were on hold, now placed on Lasix, monitor  Type 2 diabetes mellitus -Continue Levemir, mealtime insulin, sliding scale, CBGs fair  Thrombocytopenia: follow, stable over several days.  Possibly related to viral illness.  DVT prophylaxis: lovenox Code Status: DNR Family Communication: none at bedside - discussed with wife/daughter Disposition Plan: pending further improvement  Consultants:   none  Procedures:   none  Antimicrobials:  Anti-infectives (From admission, onward)   Start     Dose/Rate Route Frequency Ordered Stop   03/01/19 0800  remdesivir 100 mg in sodium chloride 0.9 % 250 mL IVPB     100 mg 500 mL/hr over 30 Minutes Intravenous Every 24 hours 02/28/19 0823 03/04/19 1216   02/28/19 1000  remdesivir 200 mg in sodium chloride 0.9 % 250 mL IVPB     200 mg 500 mL/hr over 30 Minutes Intravenous Once 02/28/19 0823 02/28/19 1006   02/28/19 0000  vancomycin (VANCOCIN) 1,250 mg in sodium chloride 0.9 % 250 mL IVPB  Status:  Discontinued     1,250 mg 166.7 mL/hr over  90 Minutes Intravenous Every 12 hours 02/21/2019 1639 02/17/2019 2037   02/20/2019 2200  ceFEPIme (MAXIPIME) 2 g in sodium chloride 0.9 % 100 mL IVPB  Status:  Discontinued     2 g 200 mL/hr over 30 Minutes  Intravenous Every 8 hours 03/02/2019 1629 03/03/19 1242   03/01/2019 1315  ceFEPIme (MAXIPIME) 2 g in sodium chloride 0.9 % 100 mL IVPB     2 g 200 mL/hr over 30 Minutes Intravenous  Once 02/22/2019 1306 02/28/19 0324   02/07/2019 1315  vancomycin (VANCOCIN) 2,000 mg in sodium chloride 0.9 % 500 mL IVPB     2,000 mg 250 mL/hr over 120 Minutes Intravenous  Once 02/10/2019 1311 02/28/19 0324       Subjective: C/o IV in hand Denies using home O2  Objective: Vitals:   03/20/19 1300 03/20/19 1400 03/20/19 1500 03/20/19 1555  BP:    128/75  Pulse: 74 72 68 73  Resp: (!) 23 (!) 21 (!) 22 (!) 25  Temp:    97.8 F (36.6 C)  TempSrc:    Axillary  SpO2: (!) 84% 100% 99% 93%  Weight:      Height:        Intake/Output Summary (Last 24 hours) at 03/20/2019 1604 Last data filed at 03/20/2019 1400 Gross per 24 hour  Intake 480 ml  Output 2005 ml  Net -1525 ml   Filed Weights   02/11/2019 1810 02/28/19 0812 03/06/19 1607  Weight: 130.7 kg 130.7 kg 124.2 kg    Examination:  General exam: Appears calm and comfortable  Respiratory system: wheezing, faint appreciated, no increased WOB Cardiovascular system: RRR Gastrointestinal system: Abdomen is nondistended, soft and nontender.  Central nervous system: Alert and oriented. No focal neurological deficits. Extremities: trace edema Skin: No rashes, lesions or ulcers Psychiatry: Judgement and insight appear normal. Mood & affect appropriate.     Data Reviewed: I have personally reviewed following labs and imaging studies  CBC: Recent Labs  Lab 03/16/19 0515 03/17/19 1536 03/18/19 0120 03/19/19 0250 03/20/19 0024  WBC 8.7 11.5* 9.5 10.1 10.8*  NEUTROABS 6.8 10.8* 8.7* 9.3* 9.6*  HGB 13.5 14.5 13.6 14.0 14.1  HCT 42.3 44.7 42.1 43.4 43.8  MCV 95.3 95.5 95.0 95.4 94.2  PLT 110* 117* 106* 120* [id number]*   Basic Metabolic Panel: Recent Labs  Lab 03/16/19 0515 03/17/19 1536 03/18/19 0120 03/19/19 0250 03/20/19 0024  NA 139 137 138 138  137  K 4.6 4.8 4.7 4.6 4.0  CL 100 98 101 96* 96*  CO2 30 29 28 30 29   GLUCOSE 91 198* 132* 114* 118*  BUN 31* 31* 29* 33* 40*  CREATININE 1.00 0.86 0.84 0.86 0.96  CALCIUM 8.8* 9.2 8.9 9.1 9.1  MG 2.3 2.3 2.4 2.3 2.3  PHOS 3.5 3.7 3.2 4.0 3.8   GFR: Estimated Creatinine Clearance: 90.4 mL/min (by C-G formula based on SCr of 0.96 mg/dL). Liver Function Tests: Recent Labs  Lab 03/16/19 0515 03/17/19 1536 03/18/19 0120 03/19/19 0250 03/20/19 0024  AST 23 29 22 18 20   ALT 52* 64* 55* 50* 52*  ALKPHOS 60 64 56 54 59  BILITOT 0.7 0.9 1.1 0.6 1.1  PROT 5.6* 6.5 5.8* 5.9* 6.1*  ALBUMIN 3.1* 3.7 3.2* 3.2* 3.4*   No results for input(s): LIPASE, AMYLASE in the last 168 hours. No results for input(s): AMMONIA in the last 168 hours. Coagulation Profile: No results for input(s): INR, PROTIME in the last 168 hours. Cardiac Enzymes: No results for input(s):  CKTOTAL, CKMB, CKMBINDEX, TROPONINI in the last 168 hours. BNP (last 3 results) No results for input(s): PROBNP in the last 8760 hours. HbA1C: No results for input(s): HGBA1C in the last 72 hours. CBG: Recent Labs  Lab 03/19/19 1210 03/19/19 1647 03/19/19 2030 03/20/19 1120 03/20/19 1553  GLUCAP 196* 229* 254* 160* 241*   Lipid Profile: No results for input(s): CHOL, HDL, LDLCALC, TRIG, CHOLHDL, LDLDIRECT in the last 72 hours. Thyroid Function Tests: No results for input(s): TSH, T4TOTAL, FREET4, T3FREE, THYROIDAB in the last 72 hours. Anemia Panel: No results for input(s): VITAMINB12, FOLATE, FERRITIN, TIBC, IRON, RETICCTPCT in the last 72 hours. Sepsis Labs: No results for input(s): PROCALCITON, LATICACIDVEN in the last 168 hours.  Recent Results (from the past 240 hour(s))  Expectorated sputum assessment w rflx to resp cult     Status: None   Collection Time: 03/12/19  2:46 PM   Specimen: Sputum  Result Value Ref Range Status   Specimen Description SPU  Final   Special Requests NONE  Final   Sputum evaluation    Final    THIS SPECIMEN IS ACCEPTABLE FOR SPUTUM CULTURE Performed at [facility], [place] [address], [place], [place] [address]    Report Status 03/12/2019 FINAL  Final  Culture, respiratory     Status: None   Collection Time: 03/12/19  2:46 PM   Specimen: Sputum  Result Value Ref Range Status   Specimen Description   Final    SPU Performed at [place] [address] [place], [place] [address]    Special Requests   Final    NONE Reflexed from [phone] Performed at [facility], [place] [address] [place], [place] [address]    Gram Stain   Final    MODERATE WBC PRESENT, PREDOMINANTLY PMN RARE GRAM POSITIVE COCCI    Culture   Final    FEW Consistent with normal respiratory flora. Performed at [facility], [place] [address] [place], [place] [address]    Report Status 03/15/2019 FINAL  Final         Radiology Studies: VAS [place] LOWER EXTREMITY VENOUS (DVT)  Result Date: 03/19/2019  Lower Venous Study Indications: Swelling.  Risk Factors: COVID 19 positive. Limitations: Body habitus and poor ultrasound/tissue interface. Comparison Study: No prior studies. Performing Technologist: Oliver Hum RVT  Examination Guidelines: Dorian Duval complete evaluation includes B-mode imaging, spectral Doppler, color Doppler, and power Doppler as needed of all accessible portions of each vessel. Bilateral testing is considered an integral part of Anjanette Gilkey complete examination. Limited examinations for reoccurring indications may be performed as noted.  +---------+---------------+---------+-----------+----------+--------------+ RIGHT    CompressibilityPhasicitySpontaneityPropertiesThrombus Aging +---------+---------------+---------+-----------+----------+--------------+ CFV      Full           Yes      Yes                                 +---------+---------------+---------+-----------+----------+--------------+ SFJ      Full                                                         +---------+---------------+---------+-----------+----------+--------------+ FV Prox  Full                                                        +---------+---------------+---------+-----------+----------+--------------+  FV Mid   Full                                                        +---------+---------------+---------+-----------+----------+--------------+ FV DistalFull                                                        +---------+---------------+---------+-----------+----------+--------------+ PFV      Full                                                        +---------+---------------+---------+-----------+----------+--------------+ POP      Full           Yes      Yes                                 +---------+---------------+---------+-----------+----------+--------------+ PTV      Full                                                        +---------+---------------+---------+-----------+----------+--------------+ PERO     Full                                                        +---------+---------------+---------+-----------+----------+--------------+   +---------+---------------+---------+-----------+----------+--------------+ LEFT     CompressibilityPhasicitySpontaneityPropertiesThrombus Aging +---------+---------------+---------+-----------+----------+--------------+ CFV      Full           Yes      Yes                                 +---------+---------------+---------+-----------+----------+--------------+ SFJ      Full                                                        +---------+---------------+---------+-----------+----------+--------------+ FV Prox  Full                                                        +---------+---------------+---------+-----------+----------+--------------+ FV Mid   Full                                                         +---------+---------------+---------+-----------+----------+--------------+  FV DistalFull                                                        +---------+---------------+---------+-----------+----------+--------------+ PFV      Full                                                        +---------+---------------+---------+-----------+----------+--------------+ POP      Full           Yes      Yes                                 +---------+---------------+---------+-----------+----------+--------------+ PTV      Full                                                        +---------+---------------+---------+-----------+----------+--------------+ PERO     Full                                                        +---------+---------------+---------+-----------+----------+--------------+     Summary: Right: There is no evidence of deep vein thrombosis in the lower extremity. No cystic structure found in the popliteal fossa. Left: There is no evidence of deep vein thrombosis in the lower extremity. No cystic structure found in the popliteal fossa.  *See table(s) above for measurements and observations.    Preliminary         Scheduled Meds: . aspirin EC  81 mg Oral Daily  . enoxaparin (LOVENOX) injection  60 mg Subcutaneous Q24H  . fluticasone  2 spray Each Nare Daily  . furosemide  40 mg Intravenous Daily  . guaiFENesin  1,200 mg Oral BID  . insulin aspart  0-20 Units Subcutaneous TID WC  . insulin aspart  10 Units Subcutaneous TID WC  . insulin detemir  12 Units Subcutaneous QHS  . insulin detemir  14 Units Subcutaneous Daily  . latanoprost  1 drop Both Eyes QHS  . loratadine  10 mg Oral Daily  . mouth rinse  15 mL Mouth Rinse BID  . methylPREDNISolone (SOLU-MEDROL) injection  60 mg Intravenous Q12H  . omega-3 acid ethyl esters  1 g Oral Daily  . pantoprazole  40 mg Oral Daily  . QUEtiapine  50 mg Oral QHS  . rosuvastatin  10 mg Oral Daily  . timolol  1  drop Left Eye BID  . umeclidinium-vilanterol  1 puff Inhalation Daily   Continuous Infusions:   LOS: 21 days    Time spent: over 30 min    Fayrene Helper, MD [facility] Pager AMION  If 7PM-7AM, please contact night-coverage [email or web address] Password [email or web address] 03/20/2019, 4:04 PM

## 2019-03-21 ENCOUNTER — Inpatient Hospital Stay (HOSPITAL_COMMUNITY): Payer: Medicare Other

## 2019-03-21 LAB — CBC WITH DIFFERENTIAL/PLATELET
Abs Immature Granulocytes: 0.06 10*3/uL (ref 0.00–0.07)
Basophils Absolute: 0 10*3/uL (ref 0.0–0.1)
Basophils Relative: 0 %
Eosinophils Absolute: 0 10*3/uL (ref 0.0–0.5)
Eosinophils Relative: 0 %
HCT: 45 % (ref 39.0–52.0)
Hemoglobin: 14.7 g/dL (ref 13.0–17.0)
Immature Granulocytes: 1 %
Lymphocytes Relative: 3 %
Lymphs Abs: 0.3 10*3/uL — ABNORMAL LOW (ref 0.7–4.0)
MCH: 30.5 pg (ref 26.0–34.0)
MCHC: 32.7 g/dL (ref 30.0–36.0)
MCV: 93.4 fL (ref 80.0–100.0)
Monocytes Absolute: 0.5 10*3/uL (ref 0.1–1.0)
Monocytes Relative: 4 %
Neutro Abs: 9.8 10*3/uL — ABNORMAL HIGH (ref 1.7–7.7)
Neutrophils Relative %: 92 %
Platelets: 121 10*3/uL — ABNORMAL LOW (ref 150–400)
RBC: 4.82 MIL/uL (ref 4.22–5.81)
RDW: 14.2 % (ref 11.5–15.5)
WBC: 10.7 10*3/uL — ABNORMAL HIGH (ref 4.0–10.5)
nRBC: 0 % (ref 0.0–0.2)

## 2019-03-21 LAB — COMPREHENSIVE METABOLIC PANEL
ALT: 52 U/L — ABNORMAL HIGH (ref 0–44)
AST: 17 U/L (ref 15–41)
Albumin: 3.4 g/dL — ABNORMAL LOW (ref 3.5–5.0)
Alkaline Phosphatase: 59 U/L (ref 38–126)
Anion gap: 14 (ref 5–15)
BUN: 46 mg/dL — ABNORMAL HIGH (ref 8–23)
CO2: 29 mmol/L (ref 22–32)
Calcium: 9.2 mg/dL (ref 8.9–10.3)
Chloride: 92 mmol/L — ABNORMAL LOW (ref 98–111)
Creatinine, Ser: 0.97 mg/dL (ref 0.61–1.24)
GFR calc Af Amer: >60 mL/min (ref >60)
GFR calc non Af Amer: >60 mL/min (ref >60)
Glucose, Bld: 172 mg/dL — ABNORMAL HIGH (ref 70–99)
Potassium: 4.1 mmol/L (ref 3.5–5.1)
Sodium: 135 mmol/L (ref 135–145)
Total Bilirubin: 0.5 mg/dL (ref 0.3–1.2)
Total Protein: 6.1 g/dL — ABNORMAL LOW (ref 6.5–8.1)

## 2019-03-21 LAB — FERRITIN: Ferritin: 363 ng/mL — ABNORMAL HIGH (ref 24–336)

## 2019-03-21 LAB — GLUCOSE, CAPILLARY
Glucose-Capillary: 154 mg/dL — ABNORMAL HIGH (ref 70–99)
Glucose-Capillary: 188 mg/dL — ABNORMAL HIGH (ref 70–99)
Glucose-Capillary: 190 mg/dL — ABNORMAL HIGH (ref 70–99)
Glucose-Capillary: 233 mg/dL — ABNORMAL HIGH (ref 70–99)

## 2019-03-21 LAB — C-REACTIVE PROTEIN: CRP: 0.6 mg/dL (ref <1.0)

## 2019-03-21 LAB — PHOSPHORUS: Phosphorus: 3.9 mg/dL (ref 2.5–4.6)

## 2019-03-21 LAB — MAGNESIUM: Magnesium: 2.5 mg/dL — ABNORMAL HIGH (ref 1.7–2.4)

## 2019-03-21 LAB — D-DIMER, QUANTITATIVE: D-Dimer, Quant: 1.04 ug/mL-FEU — ABNORMAL HIGH (ref 0.00–0.50)

## 2019-03-21 MED ORDER — FUROSEMIDE 10 MG/ML IJ SOLN
40.0000 mg | Freq: Once | INTRAMUSCULAR | Status: AC
  Administered 2019-03-21: 16:00:00 40 mg via INTRAVENOUS

## 2019-03-21 NOTE — Plan of Care (Signed)
  Problem: Education: Goal: Knowledge of General Education information will improve Description: Including pain rating scale, medication(s)/side effects and non-pharmacologic comfort measures Outcome: Progressing   Problem: Health Behavior/Discharge Planning: Goal: Ability to manage health-related needs will improve Outcome: Progressing   Problem: Nutrition: Goal: Adequate nutrition will be maintained Outcome: Progressing   

## 2019-03-21 NOTE — Progress Notes (Addendum)
PROGRESS NOTE    Justin Atkinson.  GK:[id number] DOB: 1941/02/02 DOA: 02/06/2019 PCP: [name], MD  Brief Narrative:  77 year old male with COPD/pulmonary fibrosis, recently diagnosed hypersensitivity pneumonitis following VATS on 11/9, (follows with [name] as an outpatient, recently started on steroids in November), hypertension, type 2 diabetes, hyperlipidemia, was admitted to the hospital on 02/16/2019 with increased shortness of breath and profound hypoxia.  He was diagnosed with COVID-19 initially admitted to the ICU for heated high flow.  Assessment & Plan:   Principal Problem:   Acute respiratory failure with hypoxia (HCC) Active Problems:   Hypertension   Hypercholesterolemia   Benign prostate hyperplasia   ILD (interstitial lung disease) (HCC)   Postinflammatory pulmonary fibrosis (HCC)   HCAP (healthcare-associated pneumonia)   Chronic obstructive pulmonary disease (HCC)   Pneumonia due to COVID-19 virus   Severe sepsis (HCC)   Essential hypertension   Diabetes mellitus type 2, controlled, with complications ([place])   Pulmonary fibrosis (HCC)   Hypersensitivity pneumonitis (HCC)   Acute Hypoxic Respiratory Failure due to Covid-19 Viral Illness -Patient was admitted to the hospital with hypoxic respiratory failure due to COVID-19 pneumonia.  He finished [name] course of 5 days of remdesivir, received convalescent plasma on 11/30 as well as Actemra on 11/29. -Remains profoundly hypoxic still requiring 15 L [place] and NRB -continue steroids, will need taper -Maintain negative fluid balance, continue Lasix (currently ordered daily - plan for 2nd dose today) -OOB, IS, prone as able  COVID-19 Labs  Recent Labs    03/19/19 0250 03/20/19 0024 03/21/19 0101  DDIMER 1.60* 1.29* 1.04*  FERRITIN  --   --  363*  CRP <0.5 0.6 0.6    Lab Results  Component Value Date   SARSCOV2NAA POSITIVE ([name]) 02/17/2019   SARSCOV2NAA NOT DETECTED 02/05/2019   [place] NOT DETECTED  01/09/2019   HCAP/severe sepsis  -Resolved, completed [name] course of cefepime.  Monitor off antibiotics  Pulmonary fibrosis/hypersensitivity pneumonitis -Follows with pulmonology as an outpatient, currently maintained on steroids will likely need Jyllian Haynie very prolonged steroid taper  Lower extremity swelling -Patient's appreciates his left leg is more swollen than the right, will obtain venous Doppler (prelim negative) -Continue Lasix as above  Acute on chronic diastolic CHF -No 2D echo in the system as well he had [name] stress test in November 2020 with an EF of 52%, patient does have evidence of fluid overload today with pitting lower extremity edema, continue IV Lasix, renal function is stable  Essential hypertension -His blood pressure medications were on hold, now placed on Lasix, monitor  Type 2 diabetes mellitus -Continue Levemir, mealtime insulin, sliding scale, CBGs fair  Thrombocytopenia: follow, stable over several days.  Possibly related to viral illness.  DVT prophylaxis: lovenox Code Status: DNR Family Communication: none at bedside - discussed with wife/daughter 12/18 - called wife 12/19, no answer Disposition Plan: pending further improvement  Consultants:   none  Procedures:   none  Antimicrobials:  Anti-infectives (From admission, onward)   Start     Dose/Rate Route Frequency Ordered Stop   03/01/19 0800  remdesivir 100 mg in sodium chloride 0.9 % 250 mL IVPB     100 mg 500 mL/hr over 30 Minutes Intravenous Every 24 hours 02/28/19 0823 03/04/19 1216   02/28/19 1000  remdesivir 200 mg in sodium chloride 0.9 % 250 mL IVPB     200 mg 500 mL/hr over 30 Minutes Intravenous Once 02/28/19 0823 02/28/19 1006   02/28/19 0000  vancomycin (VANCOCIN) 1,250  mg in sodium chloride 0.9 % 250 mL IVPB  Status:  Discontinued     1,250 mg 166.7 mL/hr over 90 Minutes Intravenous Every 12 hours 03/01/2019 1639 02/28/2019 2037   02/01/2019 2200  ceFEPIme (MAXIPIME) 2 g in sodium chloride  0.9 % 100 mL IVPB  Status:  Discontinued     2 g 200 mL/hr over 30 Minutes Intravenous Every 8 hours 02/14/2019 1629 03/03/19 1242   02/13/2019 1315  ceFEPIme (MAXIPIME) 2 g in sodium chloride 0.9 % 100 mL IVPB     2 g 200 mL/hr over 30 Minutes Intravenous  Once 02/26/2019 1306 02/28/19 0324   02/09/2019 1315  vancomycin (VANCOCIN) 2,000 mg in sodium chloride 0.9 % 500 mL IVPB     2,000 mg 250 mL/hr over 120 Minutes Intravenous  Once 02/08/2019 1311 02/28/19 0324       Subjective: Feels ok No new complaints  Objective: Vitals:   03/20/19 2300 03/21/19 0804 03/21/19 0912 03/21/19 1300  BP: 140/65 136/89  137/77  Pulse: 78 (!) 106  74  Resp: 20 20 20    Temp: (!) 97.4 F (36.3 C) (!) 97.5 F (36.4 C)  97.8 F (36.6 C)  TempSrc: Oral Oral  Oral  SpO2: 90% 99%  93%  Weight:      Height:        Intake/Output Summary (Last 24 hours) at 03/21/2019 1430 Last data filed at 03/21/2019 1400 Gross per 24 hour  Intake 726 ml  Output 1895 ml  Net -1169 ml   Filed Weights   02/07/2019 1810 02/28/19 0812 03/06/19 1607  Weight: 130.7 kg 130.7 kg 124.2 kg    Examination:  General: No acute distress. Cardiovascular: RRR Lungs: comfortable on NRB and [place] Abdomen: Soft, nontender, nondistended Neurological: Alert and oriented 3. Moves all extremities 4. Cranial nerves II through XII grossly intact. Skin: Warm and dry. No rashes or lesions. Extremities: No clubbing or cyanosis. Trace edema and sacral edema.   Data Reviewed: I have personally reviewed following labs and imaging studies  CBC: Recent Labs  Lab 03/17/19 1536 03/18/19 0120 03/19/19 0250 03/20/19 0024 03/21/19 0101  WBC 11.5* 9.5 10.1 10.8* 10.7*  NEUTROABS 10.8* 8.7* 9.3* 9.6* 9.8*  HGB 14.5 13.6 14.0 14.1 14.7  HCT 44.7 42.1 43.4 43.8 45.0  MCV 95.5 95.0 95.4 94.2 93.4  PLT 117* 106* 120* 108* [id number]*   Basic Metabolic Panel: Recent Labs  Lab 03/17/19 1536 03/18/19 0120 03/19/19 0250 03/20/19 0024 03/21/19 0101    NA 137 138 138 137 135  K 4.8 4.7 4.6 4.0 4.1  CL 98 101 96* 96* 92*  CO2 29 28 30 29 29   GLUCOSE 198* 132* 114* 118* 172*  BUN 31* 29* 33* 40* 46*  CREATININE 0.86 0.84 0.86 0.96 0.97  CALCIUM 9.2 8.9 9.1 9.1 9.2  MG 2.3 2.4 2.3 2.3 2.5*  PHOS 3.7 3.2 4.0 3.8 3.9   GFR: Estimated Creatinine Clearance: 89.4 mL/min (by C-G formula based on SCr of 0.97 mg/dL). Liver Function Tests: Recent Labs  Lab 03/17/19 1536 03/18/19 0120 03/19/19 0250 03/20/19 0024 03/21/19 0101  AST 29 22 18 20 17   ALT 64* 55* 50* 52* 52*  ALKPHOS 64 56 54 59 59  BILITOT 0.9 1.1 0.6 1.1 0.5  PROT 6.5 5.8* 5.9* 6.1* 6.1*  ALBUMIN 3.7 3.2* 3.2* 3.4* 3.4*   No results for input(s): LIPASE, AMYLASE in the last 168 hours. No results for input(s): AMMONIA in the last 168 hours. Coagulation Profile: No  results for input(s): INR, PROTIME in the last 168 hours. Cardiac Enzymes: No results for input(s): CKTOTAL, CKMB, CKMBINDEX, TROPONINI in the last 168 hours. BNP (last 3 results) No results for input(s): PROBNP in the last 8760 hours. HbA1C: No results for input(s): HGBA1C in the last 72 hours. CBG: Recent Labs  Lab 03/20/19 1120 03/20/19 1553 03/20/19 2025 03/21/19 0808 03/21/19 1212  GLUCAP 160* 241* 265* 154* 188*   Lipid Profile: No results for input(s): CHOL, HDL, LDLCALC, TRIG, CHOLHDL, LDLDIRECT in the last 72 hours. Thyroid Function Tests: No results for input(s): TSH, T4TOTAL, FREET4, T3FREE, THYROIDAB in the last 72 hours. Anemia Panel: Recent Labs    03/21/19 0101  FERRITIN 363*   Sepsis Labs: No results for input(s): PROCALCITON, LATICACIDVEN in the last 168 hours.  Recent Results (from the past 240 hour(s))  Expectorated sputum assessment w rflx to resp cult     Status: None   Collection Time: 03/12/19  2:46 PM   Specimen: Sputum  Result Value Ref Range Status   Specimen Description SPU  Final   Special Requests NONE  Final   Sputum evaluation   Final    THIS SPECIMEN IS  ACCEPTABLE FOR SPUTUM CULTURE Performed at [facility], [place] [address] [place], [place] [address]    Report Status 03/12/2019 FINAL  Final  Culture, respiratory     Status: None   Collection Time: 03/12/19  2:46 PM   Specimen: Sputum  Result Value Ref Range Status   Specimen Description   Final    SPU Performed at [place] [address], [place], [place] [address]    Special Requests   Final    NONE Reflexed from [phone] Performed at [facility], [place] [address], [place], [place] [address]    Gram Stain   Final    MODERATE WBC PRESENT, PREDOMINANTLY PMN RARE GRAM POSITIVE COCCI    Culture   Final    FEW Consistent with normal respiratory flora. Performed at [facility], [place] [address], [place], [place] [address]    Report Status 03/15/2019 FINAL  Final         Radiology Studies: DG CHEST PORT 1 VIEW  Result Date: 03/21/2019 CLINICAL DATA:  History of interstitial lung disease and COPD s/p vats on 11/09. Also diagnosed with COVID-19 EXAM: PORTABLE CHEST 1 VIEW COMPARISON:  02/13/2019 FINDINGS: Cardiomediastinal contours remain enlarged. Coarse interstitial prominence seen bilaterally with more dense consolidative changes in the left lung base. Also with patchy right lower lobe opacities. Resolution of subcutaneous emphysema seen along the right chest wall. No acute bone finding. IMPRESSION: 1. Resolution of subcutaneous emphysema along the right chest wall. 2. Bibasilar airspace opacities, left greater than right, suspicious for viral or atypical pneumonia superimposed on chronic lung disease given the patient's history. 3. Cardiomegaly as before. Electronically Signed   By: Zetta Bills M.D.   On: 03/21/2019 08:45   VAS [place] LOWER EXTREMITY VENOUS (DVT)  Result Date: 03/19/2019  Lower Venous Study Indications: Swelling.  Risk Factors: COVID 19 positive. Limitations: Body habitus and poor ultrasound/tissue  interface. Comparison Study: No prior studies. Performing Technologist: Oliver Hum RVT  Examination Guidelines: Cindie Rajagopalan complete evaluation includes B-mode imaging, spectral Doppler, color Doppler, and power Doppler as needed of all accessible portions of each vessel. Bilateral testing is considered an integral part of Gurjit Loconte complete examination. Limited examinations for reoccurring indications may be performed as noted.  +---------+---------------+---------+-----------+----------+--------------+ RIGHT    CompressibilityPhasicitySpontaneityPropertiesThrombus  Aging +---------+---------------+---------+-----------+----------+--------------+ CFV      Full           Yes      Yes                                 +---------+---------------+---------+-----------+----------+--------------+ SFJ      Full                                                        +---------+---------------+---------+-----------+----------+--------------+ FV Prox  Full                                                        +---------+---------------+---------+-----------+----------+--------------+ FV Mid   Full                                                        +---------+---------------+---------+-----------+----------+--------------+ FV DistalFull                                                        +---------+---------------+---------+-----------+----------+--------------+ PFV      Full                                                        +---------+---------------+---------+-----------+----------+--------------+ POP      Full           Yes      Yes                                 +---------+---------------+---------+-----------+----------+--------------+ PTV      Full                                                        +---------+---------------+---------+-----------+----------+--------------+ PERO     Full                                                         +---------+---------------+---------+-----------+----------+--------------+   +---------+---------------+---------+-----------+----------+--------------+ LEFT     CompressibilityPhasicitySpontaneityPropertiesThrombus Aging +---------+---------------+---------+-----------+----------+--------------+ CFV      Full           Yes      Yes                                 +---------+---------------+---------+-----------+----------+--------------+  SFJ      Full                                                        +---------+---------------+---------+-----------+----------+--------------+ FV Prox  Full                                                        +---------+---------------+---------+-----------+----------+--------------+ FV Mid   Full                                                        +---------+---------------+---------+-----------+----------+--------------+ FV DistalFull                                                        +---------+---------------+---------+-----------+----------+--------------+ PFV      Full                                                        +---------+---------------+---------+-----------+----------+--------------+ POP      Full           Yes      Yes                                 +---------+---------------+---------+-----------+----------+--------------+ PTV      Full                                                        +---------+---------------+---------+-----------+----------+--------------+ PERO     Full                                                        +---------+---------------+---------+-----------+----------+--------------+     Summary: Right: There is no evidence of deep vein thrombosis in the lower extremity. No cystic structure found in the popliteal fossa. Left: There is no evidence of deep vein thrombosis in the lower extremity. No cystic structure found in the popliteal fossa.  *See  table(s) above for measurements and observations.    Preliminary         Scheduled Meds: . aspirin EC  81 mg Oral Daily  . enoxaparin (LOVENOX) injection  60 mg Subcutaneous Q24H  . fluticasone  2 spray Each Nare Daily  . furosemide  40 mg Intravenous Daily  . furosemide  40 mg Intravenous Once  .  guaiFENesin  1,200 mg Oral BID  . insulin aspart  0-20 Units Subcutaneous TID WC  . insulin aspart  10 Units Subcutaneous TID WC  . insulin detemir  12 Units Subcutaneous QHS  . insulin detemir  14 Units Subcutaneous Daily  . latanoprost  1 drop Both Eyes QHS  . loratadine  10 mg Oral Daily  . mouth rinse  15 mL Mouth Rinse BID  . methylPREDNISolone (SOLU-MEDROL) injection  60 mg Intravenous Q12H  . omega-3 acid ethyl esters  1 g Oral Daily  . pantoprazole  40 mg Oral Daily  . QUEtiapine  50 mg Oral QHS  . rosuvastatin  10 mg Oral Daily  . timolol  1 drop Left Eye BID  . umeclidinium-vilanterol  1 puff Inhalation Daily   Continuous Infusions:   LOS: 22 days    Time spent: over 30 min    Fayrene Helper, MD [facility] Pager [email or web address]  If 7PM-7AM, please contact night-coverage [email or web address] Password [email or web address] 03/21/2019, 2:30 PM

## 2019-03-21 NOTE — Plan of Care (Signed)
Patient had a good day, patient easily desaturates down to 70 with exertion,  patient on HFNC and NRB when excerting himself, example, eating, transferring to [facility],   Discussed with MD, No new orders received at this time.    Problem: Education: Goal: Knowledge of General Education information will improve Description: Including pain rating scale, medication(s)/side effects and non-pharmacologic comfort measures Outcome: Progressing   Problem: Health Behavior/Discharge Planning: Goal: Ability to manage health-related needs will improve Outcome: Progressing   Problem: Clinical Measurements: Goal: Ability to maintain clinical measurements within normal limits will improve Outcome: Progressing Goal: Will remain free from infection Outcome: Progressing Goal: Diagnostic test results will improve Outcome: Progressing Goal: Respiratory complications will improve Outcome: Progressing Goal: Cardiovascular complication will be avoided Outcome: Progressing   Problem: Activity: Goal: Risk for activity intolerance will decrease Outcome: Progressing   Problem: Nutrition: Goal: Adequate nutrition will be maintained Outcome: Progressing   Problem: Coping: Goal: Level of anxiety will decrease Outcome: Progressing   Problem: Elimination: Goal: Will not experience complications related to bowel motility Outcome: Progressing Goal: Will not experience complications related to urinary retention Outcome: Progressing   Problem: Pain Managment: Goal: General experience of comfort will improve Outcome: Progressing   Problem: Safety: Goal: Ability to remain free from injury will improve Outcome: Progressing   Problem: Skin Integrity: Goal: Risk for impaired skin integrity will decrease Outcome: Progressing   Problem: Education: Goal: Knowledge of risk factors and measures for prevention of condition will improve Outcome: Progressing   Problem: Coping: Goal: Psychosocial and spiritual  needs will be supported Outcome: Progressing   Problem: Respiratory: Goal: Will maintain a patent airway Outcome: Progressing Goal: Complications related to the disease process, condition or treatment will be avoided or minimized Outcome: Progressing

## 2019-03-21 NOTE — Plan of Care (Signed)

## 2019-03-22 LAB — COMPREHENSIVE METABOLIC PANEL
ALT: 50 U/L — ABNORMAL HIGH (ref 0–44)
AST: 19 U/L (ref 15–41)
Albumin: 3.6 g/dL (ref 3.5–5.0)
Alkaline Phosphatase: 56 U/L (ref 38–126)
Anion gap: 15 (ref 5–15)
BUN: 45 mg/dL — ABNORMAL HIGH (ref 8–23)
CO2: 30 mmol/L (ref 22–32)
Calcium: 9.1 mg/dL (ref 8.9–10.3)
Chloride: 95 mmol/L — ABNORMAL LOW (ref 98–111)
Creatinine, Ser: 0.93 mg/dL (ref 0.61–1.24)
GFR calc Af Amer: >60 mL/min (ref >60)
GFR calc non Af Amer: >60 mL/min (ref >60)
Glucose, Bld: 93 mg/dL (ref 70–99)
Potassium: 3.8 mmol/L (ref 3.5–5.1)
Sodium: 140 mmol/L (ref 135–145)
Total Bilirubin: 1.1 mg/dL (ref 0.3–1.2)
Total Protein: 6.3 g/dL — ABNORMAL LOW (ref 6.5–8.1)

## 2019-03-22 LAB — CBC WITH DIFFERENTIAL/PLATELET
Abs Immature Granulocytes: 0.14 10*3/uL — ABNORMAL HIGH (ref 0.00–0.07)
Basophils Absolute: 0 10*3/uL (ref 0.0–0.1)
Basophils Relative: 0 %
Eosinophils Absolute: 0 10*3/uL (ref 0.0–0.5)
Eosinophils Relative: 0 %
HCT: 45.9 % (ref 39.0–52.0)
Hemoglobin: 15 g/dL (ref 13.0–17.0)
Immature Granulocytes: 1 %
Lymphocytes Relative: 3 %
Lymphs Abs: 0.3 10*3/uL — ABNORMAL LOW (ref 0.7–4.0)
MCH: 30.5 pg (ref 26.0–34.0)
MCHC: 32.7 g/dL (ref 30.0–36.0)
MCV: 93.3 fL (ref 80.0–100.0)
Monocytes Absolute: 0.7 10*3/uL (ref 0.1–1.0)
Monocytes Relative: 6 %
Neutro Abs: 9.4 10*3/uL — ABNORMAL HIGH (ref 1.7–7.7)
Neutrophils Relative %: 90 %
Platelets: 130 10*3/uL — ABNORMAL LOW (ref 150–400)
RBC: 4.92 MIL/uL (ref 4.22–5.81)
RDW: 14 % (ref 11.5–15.5)
WBC: 10.5 10*3/uL (ref 4.0–10.5)
nRBC: 0 % (ref 0.0–0.2)

## 2019-03-22 LAB — GLUCOSE, CAPILLARY
Glucose-Capillary: 127 mg/dL — ABNORMAL HIGH (ref 70–99)
Glucose-Capillary: 141 mg/dL — ABNORMAL HIGH (ref 70–99)
Glucose-Capillary: 196 mg/dL — ABNORMAL HIGH (ref 70–99)

## 2019-03-22 LAB — D-DIMER, QUANTITATIVE: D-Dimer, Quant: 0.71 ug/mL-FEU — ABNORMAL HIGH (ref 0.00–0.50)

## 2019-03-22 LAB — MAGNESIUM: Magnesium: 2.5 mg/dL — ABNORMAL HIGH (ref 1.7–2.4)

## 2019-03-22 LAB — FERRITIN: Ferritin: 376 ng/mL — ABNORMAL HIGH (ref 24–336)

## 2019-03-22 LAB — BRAIN NATRIURETIC PEPTIDE: B Natriuretic Peptide: 598.1 pg/mL — ABNORMAL HIGH (ref 0.0–100.0)

## 2019-03-22 LAB — C-REACTIVE PROTEIN: CRP: 0.5 mg/dL (ref <1.0)

## 2019-03-22 MED ORDER — FUROSEMIDE 10 MG/ML IJ SOLN
40.0000 mg | Freq: Once | INTRAMUSCULAR | Status: AC
  Administered 2019-03-22: 40 mg via INTRAVENOUS
  Filled 2019-03-22: qty 4

## 2019-03-22 MED ORDER — FUROSEMIDE 10 MG/ML IJ SOLN
60.0000 mg | Freq: Two times a day (BID) | INTRAMUSCULAR | Status: DC
  Administered 2019-03-23 – 2019-03-30 (×15): 60 mg via INTRAVENOUS
  Filled 2019-03-22 (×16): qty 6

## 2019-03-22 MED ORDER — POTASSIUM CHLORIDE CRYS ER 20 MEQ PO TBCR
40.0000 meq | EXTENDED_RELEASE_TABLET | Freq: Once | ORAL | Status: AC
  Administered 2019-03-22: 40 meq via ORAL
  Filled 2019-03-22: qty 2

## 2019-03-22 NOTE — Plan of Care (Signed)

## 2019-03-22 NOTE — Progress Notes (Addendum)
PROGRESS NOTE    Justin Atkinson.  [id number] DOB: [birth year] DOA: 02/18/2019 PCP: [name], MD  Brief Narrative:  79 year old male with COPD/pulmonary fibrosis, recently diagnosed hypersensitivity pneumonitis following VATS on 11/9, (follows with [name] as an outpatient, recently started on steroids in November), hypertension, type 2 diabetes, hyperlipidemia, was admitted to the hospital on 02/02/2019 with increased shortness of breath and profound hypoxia.  He was diagnosed with COVID-19 initially admitted to the ICU for heated high flow.  Assessment & Plan:   Principal Problem:   Acute respiratory failure with hypoxia (HCC) Active Problems:   Hypertension   Hypercholesterolemia   Benign prostate hyperplasia   ILD (interstitial lung disease) (HCC)   Postinflammatory pulmonary fibrosis (HCC)   HCAP (healthcare-associated pneumonia)   Chronic obstructive pulmonary disease (HCC)   Pneumonia due to COVID-19 virus   Severe sepsis (HCC)   Essential hypertension   Diabetes mellitus type 2, controlled, with complications ([place])   Pulmonary fibrosis (HCC)   Hypersensitivity pneumonitis (HCC)  Acute Hypoxic Respiratory Failure due to Covid-19 Viral Illness -Patient was admitted to the hospital with hypoxic respiratory failure due to COVID-19 pneumonia.  He finished Kavian Peters course of 5 days of remdesivir, received convalescent plasma on 11/30 as well as Actemra on 11/29. -He was on heated high flow from admission until ~12/7 - 12/8 per my review of chart and previous documentation of vitals.  His O2 needs don't seem to have changed much since then. -continue steroids, will need taper -Maintain negative fluid balance, continue Lasix (currently ordered daily - plan for 2nd dose again today) -OOB, IS, prone as able  COVID-19 Labs  Recent Labs    03/20/19 0024 03/21/19 0101 03/22/19 0150  DDIMER 1.29* 1.04* 0.71*  FERRITIN  --  363* 376*  CRP 0.6 0.6 0.5    Lab Results    Component Value Date   SARSCOV2NAA POSITIVE (Kevonte Vanecek) 02/28/2019   SARSCOV2NAA NOT DETECTED 02/05/2019   [place] NOT DETECTED 01/09/2019   HCAP/severe sepsis  -Resolved, completed Brande Uncapher course of cefepime.  Monitor off antibiotics  Pulmonary fibrosis/hypersensitivity pneumonitis -Follows with pulmonology as an outpatient, currently maintained on steroids will likely need Symir Mah very prolonged steroid taper -Will consider consulting pulm due to his prolonged course  Lower extremity swelling -Patient's appreciates his left leg is more swollen than the right, will obtain venous Doppler (negative) -Continue Lasix as above  Acute on chronic diastolic CHF -No 2D echo in the system as well he had Starlene Consuegra stress test in November 2020 with an EF of 52%, patient does have evidence of fluid overload today with pitting lower extremity edema, continue IV Lasix, renal function is stable - ech  Essential hypertension -His blood pressure medications were on hold, now placed on Lasix, monitor  Type 2 diabetes mellitus -Continue Levemir, mealtime insulin, sliding scale, CBGs fair  Thrombocytopenia: follow, stable over several days.  Possibly related to viral illness.  DVT prophylaxis: lovenox Code Status: DNR Family Communication: none at bedside - discussed with wife/daughter 12/18 - called wife 12/19, no answer.  Wife/daughter Disposition Plan: pending further improvement  Consultants:   none  Procedures:   none  Antimicrobials:  Anti-infectives (From admission, onward)   Start     Dose/Rate Route Frequency Ordered Stop   03/01/19 0800  remdesivir 100 mg in sodium chloride 0.9 % 250 mL IVPB     100 mg 500 mL/hr over 30 Minutes Intravenous Every 24 hours 02/28/19 0823 03/04/19 1216   02/28/19 1000  remdesivir  200 mg in sodium chloride 0.9 % 250 mL IVPB     200 mg 500 mL/hr over 30 Minutes Intravenous Once 02/28/19 0823 02/28/19 1006   02/28/19 0000  vancomycin (VANCOCIN) 1,250 mg in sodium  chloride 0.9 % 250 mL IVPB  Status:  Discontinued     1,250 mg 166.7 mL/hr over 90 Minutes Intravenous Every 12 hours 02/01/2019 1639 02/06/2019 2037   02/20/2019 2200  ceFEPIme (MAXIPIME) 2 g in sodium chloride 0.9 % 100 mL IVPB  Status:  Discontinued     2 g 200 mL/hr over 30 Minutes Intravenous Every 8 hours 02/23/2019 1629 03/03/19 1242   02/24/2019 1315  ceFEPIme (MAXIPIME) 2 g in sodium chloride 0.9 % 100 mL IVPB     2 g 200 mL/hr over 30 Minutes Intravenous  Once 02/23/2019 1306 02/28/19 0324   02/21/2019 1315  vancomycin (VANCOCIN) 2,000 mg in sodium chloride 0.9 % 500 mL IVPB     2,000 mg 250 mL/hr over 120 Minutes Intravenous  Once 02/15/2019 1311 02/28/19 0324       Subjective: No new complaints.  Objective: Vitals:   03/22/19 0007 03/22/19 0409 03/22/19 0800 03/22/19 0900  BP: (!) 119/92 132/82  130/88  Pulse: (!) 56 68 66 63  Resp: 20 (!) 21 17 (!) 21  Temp: 97.9 F (36.6 C) 97.6 F (36.4 C)  98 F (36.7 C)  TempSrc: Axillary Axillary  Axillary  SpO2: 91% 100% 97% 100%  Weight:      Height:        Intake/Output Summary (Last 24 hours) at 03/22/2019 1145 Last data filed at 03/22/2019 1000 Gross per 24 hour  Intake 360 ml  Output 1475 ml  Net -1115 ml   Filed Weights   02/04/2019 1810 02/28/19 0812 03/06/19 1607  Weight: 130.7 kg 130.7 kg 124.2 kg    Examination:  General: No acute distress. Cardiovascular: RRR Lungs: CTAB, no increased WOB Abdomen: Soft, nontender, nondistended  Neurological: Alert and oriented 3. Moves all extremities 4. Cranial nerves II through XII grossly intact. Skin: Warm and dry. No rashes or lesions. Extremities: No clubbing or cyanosis. Bilateral LE edema    Data Reviewed: I have personally reviewed following labs and imaging studies  CBC: Recent Labs  Lab 03/18/19 0120 03/19/19 0250 03/20/19 0024 03/21/19 0101 03/22/19 0150  WBC 9.5 10.1 10.8* 10.7* 10.5  NEUTROABS 8.7* 9.3* 9.6* 9.8* 9.4*  HGB 13.6 14.0 14.1 14.7 15.0  HCT  42.1 43.4 43.8 45.0 45.9  MCV 95.0 95.4 94.2 93.4 93.3  PLT 106* 120* 108* 121* [id number]*   Basic Metabolic Panel: Recent Labs  Lab 03/17/19 1536 03/18/19 0120 03/19/19 0250 03/20/19 0024 03/21/19 0101 03/22/19 0150  NA 137 138 138 137 135 140  K 4.8 4.7 4.6 4.0 4.1 3.8  CL 98 101 96* 96* 92* 95*  CO2 29 28 30 29 29 30   GLUCOSE 198* 132* 114* 118* 172* 93  BUN 31* 29* 33* 40* 46* 45*  CREATININE 0.86 0.84 0.86 0.96 0.97 0.93  CALCIUM 9.2 8.9 9.1 9.1 9.2 9.1  MG 2.3 2.4 2.3 2.3 2.5* 2.5*  PHOS 3.7 3.2 4.0 3.8 3.9  --    GFR: Estimated Creatinine Clearance: 93.3 mL/min (by C-G formula based on SCr of 0.93 mg/dL). Liver Function Tests: Recent Labs  Lab 03/18/19 0120 03/19/19 0250 03/20/19 0024 03/21/19 0101 03/22/19 0150  AST 22 18 20 17 19   ALT 55* 50* 52* 52* 50*  ALKPHOS 56 54 59 59 56  BILITOT 1.1 0.6 1.1 0.5 1.1  PROT 5.8* 5.9* 6.1* 6.1* 6.3*  ALBUMIN 3.2* 3.2* 3.4* 3.4* 3.6   No results for input(s): LIPASE, AMYLASE in the last 168 hours. No results for input(s): AMMONIA in the last 168 hours. Coagulation Profile: No results for input(s): INR, PROTIME in the last 168 hours. Cardiac Enzymes: No results for input(s): CKTOTAL, CKMB, CKMBINDEX, TROPONINI in the last 168 hours. BNP (last 3 results) No results for input(s): PROBNP in the last 8760 hours. HbA1C: No results for input(s): HGBA1C in the last 72 hours. CBG: Recent Labs  Lab 03/21/19 0808 03/21/19 1212 03/21/19 1619 03/21/19 2015 03/22/19 0757  GLUCAP 154* 188* 190* 233* 127*   Lipid Profile: No results for input(s): CHOL, HDL, LDLCALC, TRIG, CHOLHDL, LDLDIRECT in the last 72 hours. Thyroid Function Tests: No results for input(s): TSH, T4TOTAL, FREET4, T3FREE, THYROIDAB in the last 72 hours. Anemia Panel: Recent Labs    03/21/19 0101 03/22/19 0150  FERRITIN 363* 376*   Sepsis Labs: No results for input(s): PROCALCITON, LATICACIDVEN in the last 168 hours.  Recent Results (from the past 240  hour(s))  Expectorated sputum assessment w rflx to resp cult     Status: None   Collection Time: 03/12/19  2:46 PM   Specimen: Sputum  Result Value Ref Range Status   Specimen Description SPU  Final   Special Requests NONE  Final   Sputum evaluation   Final    THIS SPECIMEN IS ACCEPTABLE FOR SPUTUM CULTURE Performed at [facility], [place] [address] [place], [place] [address]    Report Status 03/12/2019 FINAL  Final  Culture, respiratory     Status: None   Collection Time: 03/12/19  2:46 PM   Specimen: Sputum  Result Value Ref Range Status   Specimen Description   Final    SPU Performed at [place] [address], [place], [place] [address]    Special Requests   Final    NONE Reflexed from [id number] Performed at [facility], [place] [address] [place], [place] [address]    Gram Stain   Final    MODERATE WBC PRESENT, PREDOMINANTLY PMN RARE GRAM POSITIVE COCCI    Culture   Final    FEW Consistent with normal respiratory flora. Performed at [facility], [place] [address], [place], [place] [address]    Report Status 03/15/2019 FINAL  Final         Radiology Studies: DG CHEST PORT 1 VIEW  Result Date: 03/21/2019 CLINICAL DATA:  History of interstitial lung disease and COPD s/p vats on 11/09. Also diagnosed with COVID-19 EXAM: PORTABLE CHEST 1 VIEW COMPARISON:  02/21/2019 FINDINGS: Cardiomediastinal contours remain enlarged. Coarse interstitial prominence seen bilaterally with more dense consolidative changes in the left lung base. Also with patchy right lower lobe opacities. Resolution of subcutaneous emphysema seen along the right chest wall. No acute bone finding. IMPRESSION: 1. Resolution of subcutaneous emphysema along the right chest wall. 2. Bibasilar airspace opacities, left greater than right, suspicious for viral or atypical pneumonia superimposed on chronic lung disease given the patient's history. 3.  Cardiomegaly as before. Electronically Signed   By: Zetta Bills M.D.   On: 03/21/2019 08:45        Scheduled Meds: . aspirin EC  81 mg Oral Daily  . enoxaparin (LOVENOX) injection  60 mg Subcutaneous Q24H  . fluticasone  2 spray Each Nare Daily  . furosemide  40 mg Intravenous Daily  .  guaiFENesin  1,200 mg Oral BID  . insulin aspart  0-20 Units Subcutaneous TID WC  . insulin aspart  10 Units Subcutaneous TID WC  . insulin detemir  12 Units Subcutaneous QHS  . insulin detemir  14 Units Subcutaneous Daily  . latanoprost  1 drop Both Eyes QHS  . loratadine  10 mg Oral Daily  . mouth rinse  15 mL Mouth Rinse BID  . methylPREDNISolone (SOLU-MEDROL) injection  60 mg Intravenous Q12H  . omega-3 acid ethyl esters  1 g Oral Daily  . pantoprazole  40 mg Oral Daily  . QUEtiapine  50 mg Oral QHS  . rosuvastatin  10 mg Oral Daily  . timolol  1 drop Left Eye BID  . umeclidinium-vilanterol  1 puff Inhalation Daily   Continuous Infusions:   LOS: 23 days    Time spent: over 30 min    Fayrene Helper, MD [facility] Pager AMION  If 7PM-7AM, please contact night-coverage [email or web address] Password [email or web address] 03/22/2019, 11:45 AM

## 2019-03-23 ENCOUNTER — Inpatient Hospital Stay (HOSPITAL_COMMUNITY): Payer: Medicare Other

## 2019-03-23 DIAGNOSIS — I371 Nonrheumatic pulmonary valve insufficiency: Secondary | ICD-10-CM

## 2019-03-23 DIAGNOSIS — J8410 Pulmonary fibrosis, unspecified: Secondary | ICD-10-CM

## 2019-03-23 LAB — COMPREHENSIVE METABOLIC PANEL
ALT: 65 U/L — ABNORMAL HIGH (ref 0–44)
AST: 25 U/L (ref 15–41)
Albumin: 3.8 g/dL (ref 3.5–5.0)
Alkaline Phosphatase: 62 U/L (ref 38–126)
Anion gap: 15 (ref 5–15)
BUN: 50 mg/dL — ABNORMAL HIGH (ref 8–23)
CO2: 34 mmol/L — ABNORMAL HIGH (ref 22–32)
Calcium: 9.4 mg/dL (ref 8.9–10.3)
Chloride: 89 mmol/L — ABNORMAL LOW (ref 98–111)
Creatinine, Ser: 1.02 mg/dL (ref 0.61–1.24)
GFR calc Af Amer: >60 mL/min (ref >60)
GFR calc non Af Amer: >60 mL/min (ref >60)
Glucose, Bld: 71 mg/dL (ref 70–99)
Potassium: 3.8 mmol/L (ref 3.5–5.1)
Sodium: 138 mmol/L (ref 135–145)
Total Bilirubin: 1.1 mg/dL (ref 0.3–1.2)
Total Protein: 7 g/dL (ref 6.5–8.1)

## 2019-03-23 LAB — GLUCOSE, CAPILLARY
Glucose-Capillary: 204 mg/dL — ABNORMAL HIGH (ref 70–99)
Glucose-Capillary: 109 mg/dL — ABNORMAL HIGH (ref 70–99)
Glucose-Capillary: 211 mg/dL — ABNORMAL HIGH (ref 70–99)
Glucose-Capillary: 160 mg/dL — ABNORMAL HIGH (ref 70–99)

## 2019-03-23 LAB — CBC WITH DIFFERENTIAL/PLATELET
Abs Immature Granulocytes: 0.04 10*3/uL (ref 0.00–0.07)
Basophils Absolute: 0 10*3/uL (ref 0.0–0.1)
Basophils Relative: 0 %
Eosinophils Absolute: 0 10*3/uL (ref 0.0–0.5)
Eosinophils Relative: 0 %
HCT: 51.3 % (ref 39.0–52.0)
Hemoglobin: 16.4 g/dL (ref 13.0–17.0)
Immature Granulocytes: 0 %
Lymphocytes Relative: 4 %
Lymphs Abs: 0.4 10*3/uL — ABNORMAL LOW (ref 0.7–4.0)
MCH: 30.5 pg (ref 26.0–34.0)
MCHC: 32 g/dL (ref 30.0–36.0)
MCV: 95.5 fL (ref 80.0–100.0)
Monocytes Absolute: 0.6 10*3/uL (ref 0.1–1.0)
Monocytes Relative: 7 %
Neutro Abs: 8.6 10*3/uL — ABNORMAL HIGH (ref 1.7–7.7)
Neutrophils Relative %: 89 %
Platelets: 159 10*3/uL (ref 150–400)
RBC: 5.37 MIL/uL (ref 4.22–5.81)
RDW: 14.1 % (ref 11.5–15.5)
WBC: 9.7 10*3/uL (ref 4.0–10.5)
nRBC: 0 % (ref 0.0–0.2)

## 2019-03-23 LAB — MAGNESIUM: Magnesium: 2.6 mg/dL — ABNORMAL HIGH (ref 1.7–2.4)

## 2019-03-23 LAB — FERRITIN: Ferritin: 427 ng/mL — ABNORMAL HIGH (ref 24–336)

## 2019-03-23 LAB — ECHOCARDIOGRAM COMPLETE
Height: 73 in
Weight: 4380.8 oz

## 2019-03-23 LAB — C-REACTIVE PROTEIN: CRP: 0.6 mg/dL (ref <1.0)

## 2019-03-23 LAB — D-DIMER, QUANTITATIVE: D-Dimer, Quant: 0.8 ug/mL-FEU — ABNORMAL HIGH (ref 0.00–0.50)

## 2019-03-23 LAB — BRAIN NATRIURETIC PEPTIDE: B Natriuretic Peptide: 50 pg/mL (ref 0.0–100.0)

## 2019-03-23 MED ORDER — INSULIN DETEMIR 100 UNIT/ML ~~LOC~~ SOLN: 8.0000 [IU] | Freq: Every day | SUBCUTANEOUS | Status: DC

## 2019-03-23 MED ORDER — POTASSIUM CHLORIDE CRYS ER 20 MEQ PO TBCR
40.0000 meq | EXTENDED_RELEASE_TABLET | ORAL | Status: AC
  Administered 2019-03-23 (×2): 40 meq via ORAL
  Filled 2019-03-23 (×2): qty 2

## 2019-03-23 MED ORDER — INSULIN ASPART 100 UNIT/ML ~~LOC~~ SOLN
8.0000 [IU] | Freq: Three times a day (TID) | SUBCUTANEOUS | Status: DC
  Administered 2019-03-23 – 2019-03-27 (×12): 8 [IU] via SUBCUTANEOUS

## 2019-03-23 MED ORDER — INSULIN DETEMIR 100 UNIT/ML ~~LOC~~ SOLN
5.0000 [IU] | Freq: Every day | SUBCUTANEOUS | Status: DC
  Administered 2019-03-23 – 2019-03-24 (×2): 5 [IU] via SUBCUTANEOUS
  Filled 2019-03-23 (×2): qty 0.05

## 2019-03-23 NOTE — Progress Notes (Signed)
Physical Therapy Treatment Patient Details Name: Justin Atkinson. MRN: [id number] DOB: 1940-11-15 Today's Date: 03/23/2019    History of Present Illness Pt is a 78 y.o. male recently admitted s/p VATS with lobe wedge resection (02/09/2019), now admitted 02/09/2019 with worsening cough and SOB; tested (+) COVID-19. Other PMH includes COPD/pulmonary fibrosis, cervical DDD, HTN, DM, HLD.    PT Comments    Patient breathing comfortably at rest (denied shortness of breath) with sats reading 84% via finger probe with pt on 15L HFNC and NRB mask. Obtained portable Nelcor monitor with special forehead probe and noted sats runninng 4-9% better via forehead. Utilized Nelcor during activity and pt still decreases to 75% with activity, however recovers more quickly per Nelcor (although recovery is still slow at 5-8 minutes after walking 10 ft). Pt highly motivated and requesting to be seen daily. Educated we are trying to alternate his PT and OT sessions in order to see one therapy per day.     Follow Up Recommendations  CIR;Supervision for mobility/OOB     Equipment Recommendations  Rolling walker with 5" wheels    Recommendations for Other Services Rehab consult     Precautions / Restrictions Precautions Precautions: Fall;Other (comment) Precaution Comments: 15L HFNC + 15L NRB, desats very quickly (Nelcor forehead probe in room--end cabinet) Restrictions Weight Bearing Restrictions: No    Mobility  Bed Mobility                  Transfers Overall transfer level: Needs assistance Equipment used: Rolling walker (2 wheeled);None Transfers: Sit to/from Stand Sit to Stand: Min guard         General transfer comment: Sit/stand x 3 with min guard   Ambulation/Gait Ambulation/Gait assistance: Min assist Gait Distance (Feet): 10 Feet(x 3 with seated recovery ~5-8 minutes each time) Assistive device: Rolling walker (2 wheeled) Gait Pattern/deviations: Step-through  pattern;Decreased stride length;Trunk flexed;Narrow base of support Gait velocity: reduced   General Gait Details: used 2 portable tanks (HFNC and NRB); walked straight ahead and brought chair to pt. Then "rode" back to starting point. vc for wider base of support with improved balance    Stairs             Wheelchair Mobility    Modified Rankin (Stroke Patients Only)       Balance Overall balance assessment: Needs assistance   Sitting balance-Leahy Scale: Fair       Standing balance-Leahy Scale: Fair Standing balance comment: able to stand statically without UE support; min assist with any pre-gait                            Cognition Arousal/Alertness: Awake/alert Behavior During Therapy: WFL for tasks assessed/performed Overall Cognitive Status: Within Functional Limits for tasks assessed                                        Exercises Other Exercises Other Exercises: seated rolling recliner back to start position by pushing through legs    General Comments General comments (skin integrity, edema, etc.): Utilized Nelcor forehead probe as pt resting with sats 84% per finger probe, RR 20 and denied SOB; Nelcor reading 89-94% at rest. During ambulation, Nelcor drops to 80-81%, then once seated further drops to 74%; pt cued for PLB with very slow recovery. frequent vc to not speak; part of session  interrupted by Pulmonary NP in to see pt      Pertinent Vitals/Pain Pain Assessment: Faces Faces Pain Scale: Hurts a little bit Pain Location: low back Pain Descriptors / Indicators: Discomfort Pain Intervention(s): Limited activity within patient's tolerance;Monitored during session    Home Living                      Prior Function            PT Goals (current goals can now be found in the care plan section) Acute Rehab PT Goals Patient Stated Goal: to get home to his wife Time For Goal Achievement: 03/27/19 Potential to  Achieve Goals: Good Progress towards PT goals: Progressing toward goals    Frequency    Min 3X/week      PT Plan Current plan remains appropriate    Co-evaluation              AM-PAC PT "6 Clicks" Mobility   Outcome Measure  Help needed turning from your back to your side while in a flat bed without using bedrails?: A Little Help needed moving from lying on your back to sitting on the side of a flat bed without using bedrails?: A Little Help needed moving to and from a bed to a chair (including a wheelchair)?: A Little Help needed standing up from a chair using your arms (e.g., wheelchair or bedside chair)?: A Little Help needed to walk in hospital room?: Total Help needed climbing 3-5 steps with a railing? : Total 6 Click Score: 14    End of Session Equipment Utilized During Treatment: Oxygen Activity Tolerance: Treatment limited secondary to medical complications (Comment)(decr sats) Patient left: in chair;with call bell/phone within reach Nurse Communication: Mobility status;Other (comment)(Nelcor vs finger probe) PT Visit Diagnosis: Muscle weakness (generalized) (M62.81);Difficulty in walking, not elsewhere classified (R26.2)     Time: 1117-1202(endtime adjusted to subtract NP time with pt) PT Time Calculation (min) (ACUTE ONLY): 45 min  Charges:  $Gait Training: 23-37 mins $Self Care/Home Management: 8-22                      Arby Barrette, PT Pager [phone]    Rexanne Mano 03/23/2019, 12:35 PM

## 2019-03-23 NOTE — Progress Notes (Signed)
Patient's family was contacted and updated on plan of care and new changes regarding pulmonology.  All questions answered.

## 2019-03-23 NOTE — Progress Notes (Addendum)
PROGRESS NOTE    Justin Atkinson.  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 as an outpatient, recently started on steroids in November), hypertension, type 2 diabetes, hyperlipidemia, was admitted to the hospital on 02/03/2019 with increased shortness of breath and profound hypoxia.  He was diagnosed with COVID-19 initially admitted to the ICU for heated high flow.  Assessment & Plan:   Principal Problem:   Acute respiratory failure with hypoxia (HCC) Active Problems:   Hypertension   Hypercholesterolemia   Benign prostate hyperplasia   ILD (interstitial lung disease) (HCC)   Postinflammatory pulmonary fibrosis (HCC)   HCAP (healthcare-associated pneumonia)   Chronic obstructive pulmonary disease (HCC)   Pneumonia due to COVID-19 virus   Severe sepsis (HCC)   Essential hypertension   Diabetes mellitus type 2, controlled, with complications ([place])   Pulmonary fibrosis (HCC)   Hypersensitivity pneumonitis (HCC)  Acute Hypoxic Respiratory Failure due to Covid-19 Viral Illness -Patient was admitted to the hospital with hypoxic respiratory failure due to COVID-19 pneumonia.  He finished Tennelle Taflinger course of 5 days of remdesivir, received convalescent plasma on 11/30 as well as Actemra on 11/29. -He was on heated high flow from admission until ~12/7 - 12/8 per my review of chart and previous documentation of vitals.  His O2 needs don't seem to have changed much since then. -continue steroids, will need taper -Maintain negative fluid balance, continue Lasix (currently ordered daily - he has LE edema) -OOB, IS, prone as able  COVID-19 Labs  Recent Labs    03/21/19 0101 03/22/19 0150 03/23/19 0440  DDIMER 1.04* 0.71* 0.80*  FERRITIN 363* 376* 427*  CRP 0.6 0.5 0.6    Lab Results  Component Value  Date   SARSCOV2NAA POSITIVE (Jorje Vanatta) 02/23/2019   SARSCOV2NAA NOT DETECTED 02/05/2019   [place] NOT DETECTED 01/09/2019   Pulmonary fibrosis/hypersensitivity pneumonitis -Follows with pulmonology as an outpatient, currently maintained on steroids will likely need Amory Zbikowski very prolonged steroid taper - Pulm has been reconsulted due to his history of hypersensitivity pneumonitis and prolonged course  Acute on chronic diastolic CHF -No 2D echo in the system as well he had Marlei Glomski stress test in November 2020 with an EF of 52% -echo 12/21 with normal EF, indeterminate LV diastolic parameters (see report) - IVC is normal with > 50% resp var -LE edema persists -Continue lasix, increase dose to 60 mg BID  Lower extremity swelling -Patient's appreciates his left leg is more swollen than the right, will obtain venous Doppler (negative) -Continue Lasix as above  HCAP/severe sepsis  -Resolved, completed Vernita Tague course of cefepime.  Monitor off antibiotics  Essential hypertension -His blood pressure medications were on hold, now placed on Lasix, monitor  Type 2 diabetes mellitus -Continue Levemir, mealtime insulin, sliding scale, CBGs fair.  Low fasting BG, will decrease levemir dose.  Thrombocytopenia: follow, stable over several days.  Possibly related to viral illness.  DVT prophylaxis: lovenox Code Status: DNR Family Communication: none at bedside - discussed with wife/daughter 12/18 - called wife 12/19, no answer.  Wife/daughter 12/21 Disposition Plan: pending further improvement  Consultants:   none  Procedures:   none  Antimicrobials:  Anti-infectives (From admission, onward)   Start     Dose/Rate Route Frequency Ordered Stop   03/01/19 0800  remdesivir 100 mg in sodium chloride 0.9 % 250 mL IVPB     100 mg  500 mL/hr over 30 Minutes Intravenous Every 24 hours 02/28/19 0823 03/04/19 1216   02/28/19 1000  remdesivir 200 mg in sodium chloride 0.9 % 250 mL IVPB     200 mg 500 mL/hr over 30  Minutes Intravenous Once 02/28/19 0823 02/28/19 1006   02/28/19 0000  vancomycin (VANCOCIN) 1,250 mg in sodium chloride 0.9 % 250 mL IVPB  Status:  Discontinued     1,250 mg 166.7 mL/hr over 90 Minutes Intravenous Every 12 hours 02/04/2019 1639 02/09/2019 2037   02/03/2019 2200  ceFEPIme (MAXIPIME) 2 g in sodium chloride 0.9 % 100 mL IVPB  Status:  Discontinued     2 g 200 mL/hr over 30 Minutes Intravenous Every 8 hours 02/26/2019 1629 03/03/19 1242   02/15/2019 1315  ceFEPIme (MAXIPIME) 2 g in sodium chloride 0.9 % 100 mL IVPB     2 g 200 mL/hr over 30 Minutes Intravenous  Once 02/01/2019 1306 02/28/19 0324   02/02/2019 1315  vancomycin (VANCOCIN) 2,000 mg in sodium chloride 0.9 % 500 mL IVPB     2,000 mg 250 mL/hr over 120 Minutes Intravenous  Once 02/10/2019 1311 02/28/19 0324       Subjective: No new complaints.  Objective: Vitals:   03/23/19 0800 03/23/19 0900 03/23/19 1130 03/23/19 1220  BP:    (!) 127/103  Pulse:   73 76  Resp:   18 (!) 27  Temp: (!) 97.4 F (36.3 C)   (!) 96.5 F (35.8 C)  TempSrc: Axillary   Axillary  SpO2:   (!) 88% (!) 86%  Weight:  118.2 kg    Height:        Intake/Output Summary (Last 24 hours) at 03/23/2019 1355 Last data filed at 03/23/2019 1155 Gross per 24 hour  Intake -  Output 2000 ml  Net -2000 ml   Filed Weights   02/28/19 0812 03/06/19 1607 03/23/19 0900  Weight: 130.7 kg 124.2 kg 118.2 kg    Examination:  General: No acute distress. Cardiovascular: RRR Lungs: Clear to auscultation bilaterally Abdomen: Soft, nontender, nondistended  Neurological: Alert and oriented 3. Moves all extremities 4 . Cranial nerves II through XII grossly intact. Skin: Warm and dry. No rashes or lesions. Extremities: No clubbing or cyanosis. LE edema bilaterally     Data Reviewed: I have personally reviewed following labs and imaging studies  CBC: Recent Labs  Lab 03/19/19 0250 03/20/19 0024 03/21/19 0101 03/22/19 0150 03/23/19 0440  WBC 10.1 10.8*  10.7* 10.5 9.7  NEUTROABS 9.3* 9.6* 9.8* 9.4* 8.6*  HGB 14.0 14.1 14.7 15.0 16.4  HCT 43.4 43.8 45.0 45.9 51.3  MCV 95.4 94.2 93.4 93.3 95.5  PLT 120* 108* 121* 130* [id number]   Basic Metabolic Panel: Recent Labs  Lab 03/17/19 1536 03/18/19 0120 03/19/19 0250 03/20/19 0024 03/21/19 0101 03/22/19 0150 03/23/19 0440  NA 137 138 138 137 135 140 138  K 4.8 4.7 4.6 4.0 4.1 3.8 3.8  CL 98 101 96* 96* 92* 95* 89*  CO2 29 28 30 29 29 30  34*  GLUCOSE 198* 132* 114* 118* 172* 93 71  BUN 31* 29* 33* 40* 46* 45* 50*  CREATININE 0.86 0.84 0.86 0.96 0.97 0.93 1.02  CALCIUM 9.2 8.9 9.1 9.1 9.2 9.1 9.4  MG 2.3 2.4 2.3 2.3 2.5* 2.5* 2.6*  PHOS 3.7 3.2 4.0 3.8 3.9  --   --    GFR: Estimated Creatinine Clearance: 83 mL/min (by C-G formula based on SCr of 1.02 mg/dL). Liver Function Tests: Recent Labs  Lab 03/19/19 0250 03/20/19 0024 03/21/19 0101 03/22/19 0150 03/23/19 0440  AST 18 20 17 19 25   ALT 50* 52* 52* 50* 65*  ALKPHOS 54 59 59 56 62  BILITOT 0.6 1.1 0.5 1.1 1.1  PROT 5.9* 6.1* 6.1* 6.3* 7.0  ALBUMIN 3.2* 3.4* 3.4* 3.6 3.8   No results for input(s): LIPASE, AMYLASE in the last 168 hours. No results for input(s): AMMONIA in the last 168 hours. Coagulation Profile: No results for input(s): INR, PROTIME in the last 168 hours. Cardiac Enzymes: No results for input(s): CKTOTAL, CKMB, CKMBINDEX, TROPONINI in the last 168 hours. BNP (last 3 results) No results for input(s): PROBNP in the last 8760 hours. HbA1C: No results for input(s): HGBA1C in the last 72 hours. CBG: Recent Labs  Lab 03/21/19 1619 03/21/19 2015 03/22/19 0757 03/22/19 1149 03/22/19 2120  GLUCAP 190* 233* 127* 141* 196*   Lipid Profile: No results for input(s): CHOL, HDL, LDLCALC, TRIG, CHOLHDL, LDLDIRECT in the last 72 hours. Thyroid Function Tests: No results for input(s): TSH, T4TOTAL, FREET4, T3FREE, THYROIDAB in the last 72 hours. Anemia Panel: Recent Labs    03/22/19 0150 03/23/19 0440   FERRITIN 376* 427*   Sepsis Labs: No results for input(s): PROCALCITON, LATICACIDVEN in the last 168 hours.  No results found for this or any previous visit (from the past 240 hour(s)).       Radiology Studies: ECHOCARDIOGRAM COMPLETE  Result Date: 03/23/2019   ECHOCARDIOGRAM REPORT   Patient Name:   Justin Atkinson. Date of Exam: 03/23/2019 Medical Rec #:  [id number]           Height:       73.0 in Accession #:    [id number]          Weight:       273.8 lb Date of Birth:  24-Oct-1940            BSA:          2.46 m Patient Age:    14 years            BP:           133/76 mmHg Patient Gender: M                   HR:           73 bpm. Exam Location:  Inpatient Procedure: 2D Echo Indications:    Elevated brain natriuretic peptide (BNP) level [id number]  History:        Patient has no prior history of Echocardiogram examinations.                 COPD/pulmonary fibrosis,; Risk Factors:Diabetes, Dyslipidemia                 and Hypertension. Acute Hypoxic Respiratory Failure due to                 Covid-19 Viral Illness.  Sonographer:    Darlina Sicilian RDCS Referring Phys: [id number] Everley Evora CALDWELL POWELL JR  Sonographer Comments: Esmond Plants. Patient sitting upright for oxygen demands IMPRESSIONS  1. Left ventricular ejection fraction, by visual estimation, is 55 to 60%. The left ventricle has normal function. There is moderately increased left ventricular hypertrophy.  2. Left ventricular diastolic parameters are indeterminate.  3. The left ventricle has no regional wall motion abnormalities.  4. Global right ventricle has normal systolic function.The right ventricular size is normal. No increase in right ventricular wall thickness.  5.  Left atrial size was normal.  6. Right atrial size was normal.  7. The mitral valve is normal in structure. No evidence of mitral valve regurgitation. No evidence of mitral stenosis.  8. The tricuspid valve is normal in structure. Tricuspid valve regurgitation is not  demonstrated.  9. The aortic valve is tricuspid. Aortic valve regurgitation is not visualized. Mild aortic valve sclerosis without stenosis. 10. Pulmonic regurgitation is mild. 11. The pulmonic valve was normal in structure. Pulmonic valve regurgitation is mild. 12. 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 13. The inferior vena cava is normal in size with greater than 50% respiratory variability, suggesting right atrial pressure of 3 mmHg. FINDINGS  Left Ventricle: Left ventricular ejection fraction, by visual estimation, is 55 to 60%. The left ventricle has normal function. The left ventricle has no regional wall motion abnormalities. There is moderately increased left ventricular hypertrophy. Left ventricular diastolic parameters are indeterminate. Normal left atrial pressure. Right Ventricle: The right ventricular size is normal. No increase in right ventricular wall thickness. Global RV systolic function is has normal systolic function. Left Atrium: Left atrial size was normal in size. Right Atrium: Right atrial size was normal in size Pericardium: There is no evidence of pericardial effusion. Mitral Valve: The mitral valve is normal in structure. No evidence of mitral valve regurgitation. No evidence of mitral valve stenosis by observation. Tricuspid Valve: The tricuspid valve is normal in structure. Tricuspid valve regurgitation is not demonstrated. Aortic Valve: The aortic valve is tricuspid. Aortic valve regurgitation is not visualized. Mild aortic valve sclerosis is present, with no evidence of aortic valve stenosis. Pulmonic Valve: The pulmonic valve was normal in structure. Pulmonic valve regurgitation is mild. Pulmonic regurgitation is mild. Aorta: The aortic root, ascending aorta and aortic arch are all structurally normal, with no evidence of dilitation or obstruction. 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 Venous: The inferior vena cava is normal in  size with greater than 50% respiratory variability, suggesting right atrial pressure of 3 mmHg. IAS/Shunts: No atrial level shunt detected by color flow Doppler. There is no evidence of Creg Gilmer patent foramen ovale. No ventricular septal defect is seen or detected. There is no evidence of an atrial septal defect.  LEFT VENTRICLE PLAX 2D LVIDd:         3.27 cm LVIDs:         2.56 cm LV PW:         1.44 cm LV IVS:        1.91 cm LVOT diam:     2.70 cm LV SV:         19 ml LV SV Index:   7.58 LVOT Area:     5.73 cm  LEFT ATRIUM         Index LA diam:    3.10 cm 1.26 cm/m   AORTA Ao Root diam: 4.20 cm Ao Asc diam:  4.20 cm MITRAL VALVE MV Area (PHT): 3.91 cm             SHUNTS MV PHT:        56.26 msec           Systemic Diam: 2.70 cm MV Decel Time: 194 msec MV E velocity: 63.10 cm/s 103 cm/s MV Geraldine Tesar velocity: 56.30 cm/s 70.3 cm/s MV E/Doshia Dalia ratio:  1.12       1.5  Candee Furbish MD Electronically signed by Candee Furbish MD Signature Date/Time: 03/23/2019/10:39:34 AM  Final         Scheduled Meds: . aspirin EC  81 mg Oral Daily  . enoxaparin (LOVENOX) injection  60 mg Subcutaneous Q24H  . fluticasone  2 spray Each Nare Daily  . furosemide  60 mg Intravenous BID  . guaiFENesin  1,200 mg Oral BID  . insulin aspart  0-20 Units Subcutaneous TID WC  . insulin aspart  10 Units Subcutaneous TID WC  . insulin detemir  12 Units Subcutaneous QHS  . insulin detemir  14 Units Subcutaneous Daily  . latanoprost  1 drop Both Eyes QHS  . loratadine  10 mg Oral Daily  . mouth rinse  15 mL Mouth Rinse BID  . methylPREDNISolone (SOLU-MEDROL) injection  60 mg Intravenous Q12H  . omega-3 acid ethyl esters  1 g Oral Daily  . pantoprazole  40 mg Oral Daily  . potassium chloride  40 mEq Oral Q4H  . QUEtiapine  50 mg Oral QHS  . rosuvastatin  10 mg Oral Daily  . timolol  1 drop Left Eye BID  . umeclidinium-vilanterol  1 puff Inhalation Daily   Continuous Infusions:   LOS: 24 days    Time spent: over 30 min    Fayrene Helper, MD [facility] Pager AMION  If 7PM-7AM, please contact night-coverage [email or web address] Password [email or web address] 03/23/2019, 1:55 PM

## 2019-03-23 NOTE — Progress Notes (Signed)
  Echocardiogram 2D Echocardiogram has been performed.  Darlina Sicilian M 03/23/2019, 8:22 AM

## 2019-03-23 NOTE — Progress Notes (Addendum)
NAME:  Justin Atkinson, Justin Atkinson MRN:  [id number], DOB:  [birth year], LOS: 24 ADMISSION DATE:  02/07/2019, CONSULTATION DATE:  02/16/2019 REFERRING MD:  Irine Seal, MD CHIEF COMPLAINT:  Acute hypoxemic respiratory failure  Brief History   [name]. 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   Of note he is followed in pulmonary clinic with Dr. Vaughan Browner for concerns for ILD on CT.  He underwent VATS wedge resection on 11/9 and diagnosed with hypersensitivity pneumonitis. Started on steroids on 11/18 however due to side effects (insomnia) he was decreased to 20 mg daily on 11/24.  In the ED he was hypoxemic to 80% on 8 L nasal cannula.  HR 70 RR 34 BP elevated to 152/53.  CTA obtained however study was limited due to artifact.  No central filling defects in the pulmonary arteries were noted.  Bilateral diffuse groundglass opacities.  On arrival to the ICU he required increased oxygen support. Currently on heated high flow 100% with flow 45 L/min.    PCCM called back 12/21 for re-evaluation given ongoing O2 needs.  Past Medical History  Hypersensitivity pneumonitis, emphysema, DM2, HTN, HLD, DJD, hx bladder cacner, congenital solitary kidney  Significant Hospital Events   11/27 admitted 12/21 PCCM called back for re-evaluation of O2 needs  Consults:  PCCM  Procedures:    Significant Diagnostic Tests:  CTA 11/27 interval development of bilateral diffuse groundglass opacities compared to 6/22 CT chest ECHO 12/21 >> LVEF ~ 55-60%, moderately increased LVH, no wall motion abnormalities, global RV normal systolic function, LA/RA normal size  Micro Data:  11/27 RVP- negative 11/27 SARS-Covid-2 >> Positive 11/27 blood cultures >> negative 11/27 sputum cultures >> negative 12/10 Sputum >> normal flora   Antimicrobials:  Vanco 11/27 > 11/28 Cefepime  11/27 >  Interim history/subjective:  RT reports pt desaturated to high 70's with exertion.  Slow to recover.  Pt reports feeling hopeful > wants to go home to see his wife. Pt remains on 15L [place]ter + 15L NRB. Afebrile.   Objective   Blood pressure (!) 127/103, pulse 76, temperature (!) 96.5 F (35.8 C), temperature source Axillary, resp. rate (!) 27, height 6\' 1"  (1.854 m), weight 118.2 kg, SpO2 (!) 86 %.        Intake/Output Summary (Last 24 hours) at 03/23/2019 1242 Last data filed at 03/23/2019 1155 Gross per 24 hour  Intake --  Output 2000 ml  Net -2000 ml   Filed Weights   02/28/19 [id number] 03/06/19 1607 03/23/19 0900  Weight: 130.7 kg 124.2 kg 118.2 kg   Physical Exam: General:elderly male sitting up in chair in NAD  HEENT: MM pink/moist, [place] O2 + NRB in place, dentition fair, pupils reactive  Neuro: Awake, alert, oriented, appropriate in conversation  CV: s1s2 RRR, no m/r/g PULM:  Non-labored at rest, lungs bilaterally with fine bibasilar crackles posteriorly  GI: soft, bsx4 active  Extremities: warm/dry, 2+ BLE pitting edema  Skin: no rashes or lesions  Resolved Hospital Problem list     Assessment & Plan:   Acute on Chronic Hypoxemic Respiratory Failure secondary to COVID-19 Baseline hypersenitivity pneumonitis by lung biopsy in 02/2019 with new superimposed viral infection. Completed remdesivir.  ECHO with normal LV function 12/21 -continue PT efforts / mobilize -wean O2 for sats > 85% at rest, 75% with exertion  -pulmonary hygiene-IS, flutter -lasix 60mg  BID, goal negative  balance as renal function /BP will permit   Chronic Hypersensitivity Pneumonitis -continue solumedrol 60 mg BID, has been on IV dosing since 11/27, consider reduction  -wean back to baseline prednisone dosing of 20mg  QD   Former Smoker -continue anoro, PRN BD's   Best practice:  Diet: Carb modified Pain/Anxiety/Delirium protocol (if indicated): n/a VAP protocol (if indicated): n/a DVT  prophylaxis: Lovenox per COVID protocol GI prophylaxis: PPI Glucose control: Per primary Mobility: As tolerated Code Status: DNR Family Communication: Patient updated 12/21 at bedside  Disposition: per [facility]  Labs   CBC: Recent Labs  Lab 03/19/19 0250 03/20/19 0024 03/21/19 0101 03/22/19 0150 03/23/19 0440  WBC 10.1 10.8* 10.7* 10.5 9.7  NEUTROABS 9.3* 9.6* 9.8* 9.4* 8.6*  HGB 14.0 14.1 14.7 15.0 16.4  HCT 43.4 43.8 45.0 45.9 51.3  MCV 95.4 94.2 93.4 93.3 95.5  PLT 120* 108* 121* 130* Q000111Q    Basic Metabolic Panel: Recent Labs  Lab 03/17/19 1536 03/18/19 0120 03/19/19 0250 03/20/19 0024 03/21/19 0101 03/22/19 0150 03/23/19 0440  NA 137 138 138 137 135 140 138  K 4.8 4.7 4.6 4.0 4.1 3.8 3.8  CL 98 101 96* 96* 92* 95* 89*  CO2 29 28 30 29 29 30  34*  GLUCOSE 198* 132* 114* 118* 172* 93 71  BUN 31* 29* 33* 40* 46* 45* 50*  CREATININE 0.86 0.84 0.86 0.96 0.97 0.93 1.02  CALCIUM 9.2 8.9 9.1 9.1 9.2 9.1 9.4  MG 2.3 2.4 2.3 2.3 2.5* 2.5* 2.6*  PHOS 3.7 3.2 4.0 3.8 3.9  --   --    GFR: Estimated Creatinine Clearance: 83 mL/min (by C-G formula based on SCr of 1.02 mg/dL). Recent Labs  Lab 03/20/19 0024 03/21/19 0101 03/22/19 0150 03/23/19 0440  WBC 10.8* 10.7* 10.5 9.7    Liver Function Tests: Recent Labs  Lab 03/19/19 0250 03/20/19 0024 03/21/19 0101 03/22/19 0150 03/23/19 0440  AST 18 20 17 19 25   ALT 50* 52* 52* 50* 65*  ALKPHOS 54 59 59 56 62  BILITOT 0.6 1.1 0.5 1.1 1.1  PROT 5.9* 6.1* 6.1* 6.3* 7.0  ALBUMIN 3.2* 3.4* 3.4* 3.6 3.8   No results for input(s): LIPASE, AMYLASE in the last 168 hours. No results for input(s): AMMONIA in the last 168 hours.  ABG    Component Value Date/Time   PHART 7.419 03/02/2019 0649   PCO2ART 35.3 03/02/2019 0649   PO2ART 50.0 (L) 03/02/2019 0649   HCO3 22.8 03/02/2019 0649   TCO2 24 03/02/2019 0649   ACIDBASEDEF 1.0 03/02/2019 0649   O2SAT 86.0 03/02/2019 0649     Coagulation Profile: No results for  input(s): INR, PROTIME in the last 168 hours.  Cardiac Enzymes: No results for input(s): CKTOTAL, CKMB, CKMBINDEX, TROPONINI in the last 168 hours.  HbA1C: Hgb A1c MFr Bld  Date/Time Value Ref Range Status  02/28/2019 02:29 AM 7.0 (H) 4.8 - 5.6 % Final    Comment:    (NOTE) Pre diabetes:          5.7%-6.4% Diabetes:              >6.4% Glycemic control for   <7.0% adults with diabetes     CBG: Recent Labs  Lab 03/21/19 1619 03/21/19 2015 03/22/19 0757 03/22/19 1149 03/22/19 2120  GLUCAP 190* 233* 127* 141* 196*   Critical care time: n/a    Noe Gens, MSN, NP-C [facility] 03/23/2019, 12:42 PM   Please see [email or web address] for pager details.

## 2019-03-23 NOTE — Plan of Care (Signed)

## 2019-03-24 ENCOUNTER — Ambulatory Visit: Payer: Medicare Other | Admitting: Podiatry

## 2019-03-24 ENCOUNTER — Inpatient Hospital Stay (HOSPITAL_COMMUNITY): Payer: Medicare Other

## 2019-03-24 LAB — C-REACTIVE PROTEIN: CRP: 0.6 mg/dL

## 2019-03-24 LAB — CBC WITH DIFFERENTIAL/PLATELET
Abs Immature Granulocytes: 0.06 K/uL (ref 0.00–0.07)
Basophils Absolute: 0 K/uL (ref 0.0–0.1)
Basophils Relative: 0 %
Eosinophils Absolute: 0 K/uL (ref 0.0–0.5)
Eosinophils Relative: 0 %
HCT: 46.2 % (ref 39.0–52.0)
Hemoglobin: 15.2 g/dL (ref 13.0–17.0)
Immature Granulocytes: 1 %
Lymphocytes Relative: 3 %
Lymphs Abs: 0.3 K/uL — ABNORMAL LOW (ref 0.7–4.0)
MCH: 31 pg (ref 26.0–34.0)
MCHC: 32.9 g/dL (ref 30.0–36.0)
MCV: 94.1 fL (ref 80.0–100.0)
Monocytes Absolute: 0.4 K/uL (ref 0.1–1.0)
Monocytes Relative: 5 %
Neutro Abs: 7.4 K/uL (ref 1.7–7.7)
Neutrophils Relative %: 91 %
Platelets: 135 K/uL — ABNORMAL LOW (ref 150–400)
RBC: 4.91 MIL/uL (ref 4.22–5.81)
RDW: 14.1 % (ref 11.5–15.5)
WBC: 8.1 K/uL (ref 4.0–10.5)
nRBC: 0 % (ref 0.0–0.2)

## 2019-03-24 LAB — COMPREHENSIVE METABOLIC PANEL WITH GFR
ALT: 79 U/L — ABNORMAL HIGH (ref 0–44)
AST: 26 U/L (ref 15–41)
Albumin: 3.3 g/dL — ABNORMAL LOW (ref 3.5–5.0)
Alkaline Phosphatase: 54 U/L (ref 38–126)
Anion gap: 13 (ref 5–15)
BUN: 56 mg/dL — ABNORMAL HIGH (ref 8–23)
CO2: 32 mmol/L (ref 22–32)
Calcium: 9 mg/dL (ref 8.9–10.3)
Chloride: 90 mmol/L — ABNORMAL LOW (ref 98–111)
Creatinine, Ser: 1.01 mg/dL (ref 0.61–1.24)
GFR calc Af Amer: >60 mL/min
GFR calc non Af Amer: >60 mL/min
Glucose, Bld: 110 mg/dL — ABNORMAL HIGH (ref 70–99)
Potassium: 4.1 mmol/L (ref 3.5–5.1)
Sodium: 135 mmol/L (ref 135–145)
Total Bilirubin: 1 mg/dL (ref 0.3–1.2)
Total Protein: 6 g/dL — ABNORMAL LOW (ref 6.5–8.1)

## 2019-03-24 LAB — GLUCOSE, CAPILLARY
Glucose-Capillary: 227 mg/dL — ABNORMAL HIGH (ref 70–99)
Glucose-Capillary: 151 mg/dL — ABNORMAL HIGH (ref 70–99)
Glucose-Capillary: 190 mg/dL — ABNORMAL HIGH (ref 70–99)
Glucose-Capillary: 166 mg/dL — ABNORMAL HIGH (ref 70–99)
Glucose-Capillary: 173 mg/dL — ABNORMAL HIGH (ref 70–99)

## 2019-03-24 LAB — MAGNESIUM: Magnesium: 2.6 mg/dL — ABNORMAL HIGH (ref 1.7–2.4)

## 2019-03-24 LAB — D-DIMER, QUANTITATIVE: D-Dimer, Quant: 0.67 ug{FEU}/mL — ABNORMAL HIGH (ref 0.00–0.50)

## 2019-03-24 LAB — FERRITIN: Ferritin: 419 ng/mL — ABNORMAL HIGH (ref 24–336)

## 2019-03-24 LAB — BRAIN NATRIURETIC PEPTIDE: B Natriuretic Peptide: 49.8 pg/mL (ref 0.0–100.0)

## 2019-03-24 LAB — HEPATITIS B SURFACE ANTIGEN: Hepatitis B Surface Ag: NONREACTIVE

## 2019-03-24 LAB — PHOSPHORUS: Phosphorus: 4.4 mg/dL (ref 2.5–4.6)

## 2019-03-24 MED ORDER — ENSURE ENLIVE PO LIQD
237.0000 mL | Freq: Two times a day (BID) | ORAL | Status: DC
  Administered 2019-03-24 – 2019-04-06 (×6): 237 mL via ORAL

## 2019-03-24 MED ORDER — METHYLPREDNISOLONE SODIUM SUCC 40 MG IJ SOLR: 40.0000 mg | Freq: Two times a day (BID) | INTRAMUSCULAR | Status: DC

## 2019-03-24 MED ORDER — PREDNISONE 20 MG PO TABS
40.0000 mg | ORAL_TABLET | Freq: Every day | ORAL | Status: DC
  Administered 2019-03-25: 40 mg via ORAL
  Filled 2019-03-24: qty 2

## 2019-03-24 MED ORDER — POTASSIUM CHLORIDE CRYS ER 20 MEQ PO TBCR
40.0000 meq | EXTENDED_RELEASE_TABLET | Freq: Once | ORAL | Status: AC
  Administered 2019-03-24: 40 meq via ORAL
  Filled 2019-03-24: qty 2

## 2019-03-24 MED ORDER — METHYLPREDNISOLONE SODIUM SUCC 40 MG IJ SOLR: 40.0000 mg | INTRAMUSCULAR | Status: DC

## 2019-03-24 MED ORDER — ADULT MULTIVITAMIN W/MINERALS CH
1.0000 | ORAL_TABLET | Freq: Every day | ORAL | Status: DC
  Administered 2019-03-24 – 2019-04-06 (×13): 1 via ORAL
  Filled 2019-03-24 (×15): qty 1

## 2019-03-24 NOTE — Progress Notes (Signed)
Initial Nutrition Assessment  DOCUMENTATION CODES:   Obesity unspecified  INTERVENTION:    Ensure Enlive po BID, each supplement provides 350 kcal and 20 grams of protein.  Continue Magic cup BID with lunch and dinner, each supplement provides 290 kcal and 9 grams of protein, automatically on meal trays to optimize nutritional intake.   MVI with minerals daily.  NUTRITION DIAGNOSIS:   Increased nutrient needs related to acute illness(COVID-19) as evidenced by estimated needs.  GOAL:   Patient will meet greater than or equal to 90% of their needs  MONITOR:   PO intake, Supplement acceptance, Labs, Weight trends  REASON FOR ASSESSMENT:   Consult Assessment of nutrition requirement/status  ASSESSMENT:   78 yo male admitted with acute on chronic hypoxic respiratory failure. Tested positive for COVID-19. PMH includes hypersensitivity pneumonitis, COPD, pulmonary fibrosis, DM-2, HTN, HLD, DJD.   Patient is currently on 15 L salter and 15 L NRB.  Labs reviewed.  CBG's: 211-160-151  Medications reviewed and include lasix, novolog, levemir, solu-medrol, lovaza.  Patient with 14% weight loss within the past month.  He has been consuming 25-75% of meals within the past week.  Intake is good, however, doubt patient is meeting increased nutrition needs required for COVID-19. Patient would benefit from PO supplements to ensure adequate intake to meet increased nutrition needs for COVID-19.  NUTRITION - FOCUSED PHYSICAL EXAM:  unable to complete due to COVID restrictions  Diet Order:   Diet Order            Diet heart healthy/carb modified Room service appropriate? Yes; Fluid consistency: Thin  Diet effective now              EDUCATION NEEDS:   Not appropriate for education at this time  Skin:  Skin Assessment: Reviewed RN Assessment  Last BM:  12/22 type 3  Height:   Ht Readings from Last 1 Encounters:  02/28/19 6\' 1"  (1.854 m)    Weight:   Wt Readings  from Last 1 Encounters:  03/23/19 118.2 kg    Ideal Body Weight:  83.6 kg  BMI:  Body mass index is 34.38 kg/m.  Estimated Nutritional Needs:   Kcal:  2400-2600  Protein:  125-150 gm  Fluid:  >/= 2.4 L    Molli Barrows, RD, LDN, [place] Pager [phone] After Hours Pager [phone]

## 2019-03-24 NOTE — Progress Notes (Signed)
NAME:  Justin Atkinson, Justin Atkinson MRN:  [id number], DOB:  [birth year], LOS: 25 ADMISSION DATE:  02/01/2019, CONSULTATION DATE:  02/25/2019 REFERRING MD:  Irine Seal, MD CHIEF COMPLAINT:  Acute hypoxemic respiratory failure  Brief History   Justin Atkinson. 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   Of note he is followed in pulmonary clinic with Dr. Vaughan Browner for concerns for ILD on CT.  He underwent VATS wedge resection on 11/9 and diagnosed with hypersensitivity pneumonitis. Started on steroids on 11/18 however due to side effects (insomnia) he was decreased to 20 mg daily on 11/24.  In the ED he was hypoxemic to 80% on 8 L nasal cannula.  HR 70 RR 34 BP elevated to 152/53.  CTA obtained however study was limited due to artifact.  No central filling defects in the pulmonary arteries were noted.  Bilateral diffuse groundglass opacities.  On arrival to the ICU he required increased oxygen support. Currently on heated high flow 100% with flow 45 L/min.    PCCM called back 12/21 for re-evaluation given ongoing O2 needs.  Past Medical History  Hypersensitivity pneumonitis, emphysema, DM2, HTN, HLD, DJD, hx bladder cacner, congenital solitary kidney  Significant Hospital Events   11/27 admitted 12/21 PCCM called back for re-evaluation of O2 needs  Consults:  PCCM  Procedures:    Significant Diagnostic Tests:  CTA 11/27 interval development of bilateral diffuse groundglass opacities compared to 6/22 CT chest ECHO 12/21 >> LVEF ~ 55-60%, moderately increased LVH, no wall motion abnormalities, global RV normal systolic function, LA/RA normal size  Micro Data:  11/27 RVP- negative 11/27 SARS-Covid-2 >> Positive 11/27 blood cultures >> negative 11/27 sputum cultures >> negative 12/10 Sputum >> normal flora   Antimicrobials:  Vanco 11/27 > 11/28 Cefepime  11/27 >  Interim history/subjective:  Still has high oxygen requirements and dyspnea notable particularly with exertion.  Not using his incentive spirometer as much as he should be.  In good spirits otherwise.  Denies any pain.  Objective   Blood pressure 121/71, pulse 82, temperature (!) 97.2 F (36.2 C), temperature source Axillary, resp. rate (!) 21, height 6\' 1"  (1.854 m), weight 118.2 kg, SpO2 (!) 83 %.        Intake/Output Summary (Last 24 hours) at 03/24/2019 1636 Last data filed at 03/24/2019 1117 Gross per 24 hour  Intake 480 ml  Output 1050 ml  Net -570 ml   Filed Weights   02/28/19 [id number] 03/06/19 1607 03/23/19 0900  Weight: 130.7 kg 124.2 kg 118.2 kg   Physical Exam: GEN: Elderly man in no acute distress HEENT: Mucous membranes moist, trachea midline CV: Heart sounds regular, extremities warm PULM: He is occasional crackles at bases, no wheezing, no accessory muscle use GI: Abdomen soft, hypoactive bowel sounds EXT: He has 2-3+ pitting edema to thighs NEURO: Moves all 4 extremities to command PSYCH: Alert and oriented x3 SKIN: Pale  CBGs little on higher side Sodium slightly lower, creatinine unchanged, BNP noted CRP benign CBC stable Net -1.1 L yesterday, net -13 L for admission if charting accurate Chest x-ray with dense left lower lobe infiltrate versus effusion  Resolved Hospital Problem list     Assessment & Plan:  Multifactorial acute on chronic hypoxemic respiratory failure-related to primarily Covid induced lung injury.  He has underlying HSP but based on his imaging back in June as well as  his PFTs this is mild.  His lingering oxygen needs seem out of proportion to his imaging findings.  His left lung looks a little odd on x-ray, question underlying effusion or atelectasis although guess it could just be his enormous heart. - Will look at left chest with [place] - Continue diuretic challenge - Drop steroids to prednisone 40mg  daily, this does not look like  ILD flare and his HSP is mild - Agree with LE duplex - If [place] unrevealing will get a dry CT chest to look at LLL better and degree of GGO/fibrosis - Patient needs to use IS more, he will work on it - Progressive mobility - Will follow with you  Erskine Emery MD PCCM

## 2019-03-24 NOTE — Progress Notes (Signed)
I called and updated the patient's daughter, [name], and answered all of her questions. No further needs at this time.

## 2019-03-24 NOTE — Progress Notes (Signed)
Patients family updated on plan of care for today and situation.  All questions answered.

## 2019-03-24 NOTE — Consult Note (Signed)
Physical Medicine and Rehabilitation Consult Reason for Consult: Impaired mobility and ADLs 2/2 COVID-19 Referring Physician: Dr. Kendra Opitz. Florene Glen   HPI: Keighan Amezcua. is a 78 y.o. male with COPD/pulmonary fibrosis and recently diagnosed hypersensitivity pneumonitis following VATS on 11/19, HTN, DM2, HLD, who was admitted on 11/27 with SOB and hypoxia and was diagnosed with COVID-19, admitted to the ICU for heated high flow.   Denies pain, constipation. Sleeping well at night.   He would like to go home but since he requires further hospitalization for weaning he would prefer to receive additional therapy. He feels tired after his 45 minute PT sessions but has requested them to be daily. He feels he would be able to tolerate multiple such sessions during the day as long as he received a break in between.  He lives with his wife, but she would be unable to help him at home. She has MS and they have home health aides who assist her.   ROS negative except as indicated in HPI Past Medical History:  Diagnosis Date  . Bladder cancer [facility]) 2001   Surgery  . Complication of anesthesia 2001   woke up during colonoscopy  . COPD (chronic obstructive pulmonary disease) ([place])    "beginning Emphesma"  . DDD (degenerative disc disease), cervical   . Diabetes mellitus without complication ([place])   . DJD (degenerative joint disease)   . Glaucoma   . HTN (hypertension)   . Hypercholesterolemia   . Kidney anomaly, congenital    "born with one kidney"  . Shortness of breath dyspnea    due to overweight and decreased exercise   Past Surgical History:  Procedure Laterality Date  . BLADDER SURGERY  2001   cystoscopic  . [place]  . COLONOSCOPY W/ POLYPECTOMY    . COLONOSCOPY WITH PROPOFOL N/A 05/04/2015   Procedure: COLONOSCOPY WITH PROPOFOL;  Surgeon: Clarene Essex, MD;  Location: [facility] ENDOSCOPY;  Service: Endoscopy;  Laterality: N/A;  . EYE SURGERY Bilateral    laser for Glaucoma  . HERNIA REPAIR  1960  . INGUINAL HERNIA REPAIR Right 08/01/2012   Procedure: HERNIA REPAIR INGUINAL ADULT;  Surgeon: Earnstine Regal, MD;  Location: [facility] ORS;  Service: General;  Laterality: Right;  . INSERTION OF MESH Right 08/01/2012   Procedure: INSERTION OF MESH;  Surgeon: Earnstine Regal, MD;  Location: [facility] ORS;  Service: General;  Laterality: Right;  . TONSILLECTOMY    . VIDEO ASSISTED THORACOSCOPY (VATS)/WEDGE RESECTION Right 02/09/2019   Procedure: VIDEO ASSISTED THORACOSCOPY (VATS)/WEDGE RESECTION;  Surgeon: Lajuana Matte, MD;  Location: [place];  Service: Thoracic;  Laterality: Right;  Marland Kitchen VIDEO BRONCHOSCOPY N/A 02/09/2019   Procedure: VIDEO BRONCHOSCOPY;  Surgeon: Lajuana Matte, MD;  Location: [facility] OR;  Service: Thoracic;  Laterality: N/A;   Family History  Problem Relation Age of Onset  . Cancer Father        lung   Social History:  reports that he quit smoking about 4 years ago. His smoking use included cigarettes and pipe. He has a 10.00 pack-year smoking history. He has never used smokeless tobacco. He reports current alcohol use of about 14.0 standard drinks of alcohol per week. He reports that he does not use drugs. Allergies: No Known Allergies Medications Prior to Admission  Medication Sig Dispense Refill  . acetaminophen (TYLENOL) 500 MG tablet Take 2 tablets (1,000 mg total) by mouth every 6 (six) hours. (Patient taking differently: Take  1,000 mg by mouth every 6 (six) hours as needed for mild pain. ) 30 tablet 0  . ANORO ELLIPTA 62.5-25 MCG/INH AEPB Inhale 1 puff into the lungs daily.     Marland Kitchen aspirin EC 81 MG tablet Take 81 mg by mouth daily.    . Brinzolamide-Brimonidine (SIMBRINZA) 1-0.2 % SUSP Place 1 drop into the left eye 2 (two) times daily as needed (eye redness).     . Cholecalciferol (VITAMIN D-3 PO) Take 1,000 Units by mouth daily.    . irbesartan (AVAPRO) 75 MG tablet Take 1 tablet (75 mg total) by mouth daily. 30 tablet 1  . LUMIGAN 0.01 %  SOLN Place 1 drop into both eyes at bedtime.     . metFORMIN (GLUCOPHAGE-XR) 500 MG 24 hr tablet Take 500 mg by mouth 2 (two) times daily.     . Multiple Vitamins-Minerals (MULTIVITAMIN PO) Take 1 tablet by mouth daily.     . Omega-3 Fatty Acids (FISH OIL) 1000 MG CAPS Take 1,000 mg by mouth daily.     . pantoprazole (PROTONIX) 40 MG tablet Take 40 mg by mouth daily.     . predniSONE (DELTASONE) 20 MG tablet Take 2 tablets (40 mg total) by mouth daily with breakfast. (Patient taking differently: Take 20 mg by mouth daily with breakfast. ) 60 tablet 1  . rosuvastatin (CRESTOR) 10 MG tablet Take 10 mg by mouth daily.     . timolol (BETIMOL) 0.5 % ophthalmic solution Place 1 drop into the left eye 2 (two) times daily.    . blood glucose meter kit and supplies Dispense based on pt. and insurance preference. Use up to four times daily as directed. (FOR ICD-10 E10.9). 1 each 0  . doxycycline (VIBRA-TABS) 100 MG tablet Take 1 tablet (100 mg total) by mouth 2 (two) times daily. 14 tablet 0  . traMADol (ULTRAM) 50 MG tablet Take 1 tablet (50 mg total) by mouth every 6 (six) hours as needed (mild pain). (Patient not taking: Reported on 02/20/2019) 30 tablet 0    Home: Home Living Family/patient expects to be discharged to:: Private residence Living Arrangements: Spouse/significant other Available Help at Discharge: Family, Personal care attendant, Available PRN/intermittently Type of Home: House Home Access: Level entry Home Layout: Two level, Able to live on main level with bedroom/bathroom Alternate Level Stairs-Number of Steps: 16 Alternate Level Stairs-Rails: Left Bathroom Shower/Tub: Multimedia programmer: Handicapped height Home Equipment: None(wife has equipment) Additional Comments: Wife has MS and has PCA to assist her with BADLs and transfers  Functional History: Prior Function Level of Independence: Independent Comments: pt limited community ambulator 2/2 back pain from  DDD Functional Status:  Mobility: Bed Mobility Overal bed mobility: Needs Assistance Bed Mobility: Supine to Sit Rolling: Min assist Supine to sit: Min assist, HOB elevated General bed mobility comments: Pt sitting in bedside chair upon OT arrival Transfers Overall transfer level: Needs assistance Equipment used: Rolling walker (2 wheeled), 1 person hand held assist Transfers: Sit to/from Stand, Stand Pivot Transfers Sit to Stand: Min guard Stand pivot transfers: Min guard, Min assist General transfer comment: Used RW for sit/stand transfers. Hand held assist for stand pivot to [facility] Ambulation/Gait Ambulation/Gait assistance: Min assist Gait Distance (Feet): 10 Feet(x 3 with seated recovery ~5-8 minutes each time) Assistive device: Rolling walker (2 wheeled) Gait Pattern/deviations: Step-through pattern, Decreased stride length, Trunk flexed, Narrow base of support General Gait Details: used 2 portable tanks (HFNC and NRB); walked straight ahead and brought chair to pt.  Then "rode" back to starting point. vc for wider base of support with improved balance  Gait velocity: reduced Gait velocity interpretation: <1.31 ft/sec, indicative of household ambulator    ADL: ADL Overall ADL's : Needs assistance/impaired Eating/Feeding: Set up, Bed level Grooming: Wash/dry face, Wash/dry hands, Brushing hair, Set up, Sitting Upper Body Bathing: Moderate assistance, Bed level Lower Body Bathing: Maximal assistance, Bed level Upper Body Dressing : Moderate assistance, Bed level Lower Body Dressing: Supervision/safety, Sitting/lateral leans Lower Body Dressing Details (indicate cue type and reason): To don/doff socks, utilizing figure four position Toilet Transfer: Min guard, BSC, Minimal assistance Toileting- Clothing Manipulation and Hygiene: Minimal assistance, Sit to/from stand(Assist with toilet hygiene following BM. ) Toileting - Clothing Manipulation Details (indicate cue type and  reason): assist for pericare in standing; pt attempted to have BM but unsuccessful Functional mobility during ADLs: Min guard, Rolling walker, Minimal assistance General ADL Comments: Pt able to transfer to/from bedside commode this date.  Cognition: Cognition Overall Cognitive Status: Within Functional Limits for tasks assessed Orientation Level: Oriented X4 Cognition Arousal/Alertness: Awake/alert Behavior During Therapy: WFL for tasks assessed/performed Overall Cognitive Status: Within Functional Limits for tasks assessed General Comments: Pt appears to be more frustrated with his situation today. Per pt "I'm going on 25 days now." Difficult to assess due to: (Increased anxiety and activity tolerance limited assessment)  Blood pressure 121/71, pulse 82, temperature (!) 97.2 F (36.2 C), temperature source Axillary, resp. rate (!) 21, height 6' 1" (1.854 m), weight 118.2 kg, SpO2 (!) 83 %. Physical Exam  General: Alert and oriented x 3, No apparent distress HEENT: Head is normocephalic, atraumatic, PERRLA, EOMI, sclera anicteric, oral mucosa pink and moist, dentition intact, ext ear canals clear. Neck: Supple without JVD or lymphadenopathy Heart: Reg rate and rhythm. No murmurs rubs or gallops Chest: CTA bilaterally without wheezes, rales, or rhonchi; no distress. On 12L HFNC Abdomen: Soft, non-tender, non-distended, bowel sounds positive. Extremities: No clubbing, cyanosis, or edema. Pulses are 2+ Skin: Clean and intact without signs of breakdown Neuro: Pt is cognitively appropriate with normal insight, memory, and awareness. Cranial nerves 2-12 are intact. Sensory exam is normal. Reflexes are 2+ in all 4's. Fine motor coordination is intact. No tremors.  Musculoskeletal: Desatted to 60s with MMT, but demonstrated 5/5 strength throughout. Psych: [name] affect is appropriate. Pt is cooperative   Results for orders placed or performed during the hospital encounter of 03/02/2019 (from the  past 24 hour(s))  Glucose, capillary     Status: Abnormal   Collection Time: 03/23/19  3:14 PM  Result Value Ref Range   Glucose-Capillary 160 (H) 70 - 99 mg/dL  Glucose, capillary     Status: Abnormal   Collection Time: 03/23/19  8:16 PM  Result Value Ref Range   Glucose-Capillary 227 (H) 70 - 99 mg/dL  Hepatitis B surface antigen     Status: None   Collection Time: 03/24/19  2:50 AM  Result Value Ref Range   Hepatitis B Surface Ag NON REACTIVE NON REACTIVE  CBC with Differential/Platelet     Status: Abnormal   Collection Time: 03/24/19  2:50 AM  Result Value Ref Range   WBC 8.1 4.0 - 10.5 K/uL   RBC 4.91 4.22 - 5.81 MIL/uL   Hemoglobin 15.2 13.0 - 17.0 g/dL   HCT 46.2 39.0 - 52.0 %   MCV 94.1 80.0 - 100.0 fL   MCH 31.0 26.0 - 34.0 pg   MCHC 32.9 30.0 - 36.0 g/dL   RDW 14.1  11.5 - 15.5 %   Platelets 135 (L) 150 - 400 K/uL   nRBC 0 0.0 - 0.2 %   Neutrophils Relative % 91 %   Neutro Abs 7.4 1.7 - 7.7 K/uL   Lymphocytes Relative 3 %   Lymphs Abs 0.3 (L) 0.7 - 4.0 K/uL   Monocytes Relative 5 %   Monocytes Absolute 0.4 0.1 - 1.0 K/uL   Eosinophils Relative 0 %   Eosinophils Absolute 0.0 0.0 - 0.5 K/uL   Basophils Relative 0 %   Basophils Absolute 0.0 0.0 - 0.1 K/uL   Immature Granulocytes 1 %   Abs Immature Granulocytes 0.06 0.00 - 0.07 K/uL  Comprehensive metabolic panel     Status: Abnormal   Collection Time: 03/24/19  2:50 AM  Result Value Ref Range   Sodium 135 135 - 145 mmol/L   Potassium 4.1 3.5 - 5.1 mmol/L   Chloride 90 (L) 98 - 111 mmol/L   CO2 32 22 - 32 mmol/L   Glucose, Bld 110 (H) 70 - 99 mg/dL   BUN 56 (H) 8 - 23 mg/dL   Creatinine, Ser 1.01 0.61 - 1.24 mg/dL   Calcium 9.0 8.9 - 10.3 mg/dL   Total Protein 6.0 (L) 6.5 - 8.1 g/dL   Albumin 3.3 (L) 3.5 - 5.0 g/dL   AST 26 15 - 41 U/L   ALT 79 (H) 0 - 44 U/L   Alkaline Phosphatase 54 38 - 126 U/L   Total Bilirubin 1.0 0.3 - 1.2 mg/dL   GFR calc non Af Amer >60 >60 mL/min   GFR calc Af Amer >60 >60 mL/min    Anion gap 13 5 - 15  C-reactive protein     Status: None   Collection Time: 03/24/19  2:50 AM  Result Value Ref Range   CRP 0.6 <1.0 mg/dL  D-dimer, quantitative (not at [facility])     Status: Abnormal   Collection Time: 03/24/19  2:50 AM  Result Value Ref Range   D-Dimer, Quant 0.67 (H) 0.00 - 0.50 ug/mL-FEU  Brain natriuretic peptide     Status: None   Collection Time: 03/24/19  2:50 AM  Result Value Ref Range   B Natriuretic Peptide 49.8 0.0 - 100.0 pg/mL  Ferritin     Status: Abnormal   Collection Time: 03/24/19  2:50 AM  Result Value Ref Range   Ferritin 419 (H) 24 - 336 ng/mL  Magnesium     Status: Abnormal   Collection Time: 03/24/19  2:50 AM  Result Value Ref Range   Magnesium 2.6 (H) 1.7 - 2.4 mg/dL  Phosphorus     Status: None   Collection Time: 03/24/19  2:50 AM  Result Value Ref Range   Phosphorus 4.4 2.5 - 4.6 mg/dL  Glucose, capillary     Status: Abnormal   Collection Time: 03/24/19  7:37 AM  Result Value Ref Range   Glucose-Capillary 151 (H) 70 - 99 mg/dL  Glucose, capillary     Status: Abnormal   Collection Time: 03/24/19 11:30 AM  Result Value Ref Range   Glucose-Capillary 190 (H) 70 - 99 mg/dL   ECHOCARDIOGRAM COMPLETE  Result Date: 03/23/2019   ECHOCARDIOGRAM REPORT   Patient Name:   Juwann Sherk. Date of Exam: 03/23/2019 Medical Rec #:  [id number]           Height:       73.0 in Accession #:    [id number]          Weight:  273.8 lb Date of Birth:  1940/07/01            BSA:          2.46 m Patient Age:    70 years            BP:           133/76 mmHg Patient Gender: M                   HR:           73 bpm. Exam Location:  Inpatient Procedure: 2D Echo Indications:    Elevated brain natriuretic peptide (BNP) level [[id number]]  History:        Patient has no prior history of Echocardiogram examinations.                 COPD/pulmonary fibrosis,; Risk Factors:Diabetes, Dyslipidemia                 and Hypertension. Acute Hypoxic Respiratory Failure due to                  Covid-19 Viral Illness.  Sonographer:    Darlina Sicilian RDCS Referring Phys: [phone] A CALDWELL POWELL JR  Sonographer Comments: Esmond Plants. Patient sitting upright for oxygen demands IMPRESSIONS  1. Left ventricular ejection fraction, by visual estimation, is 55 to 60%. The left ventricle has normal function. There is moderately increased left ventricular hypertrophy.  2. Left ventricular diastolic parameters are indeterminate.  3. The left ventricle has no regional wall motion abnormalities.  4. Global right ventricle has normal systolic function.The right ventricular size is normal. No increase in right ventricular wall thickness.  5. Left atrial size was normal.  6. Right atrial size was normal.  7. The mitral valve is normal in structure. No evidence of mitral valve regurgitation. No evidence of mitral stenosis.  8. The tricuspid valve is normal in structure. Tricuspid valve regurgitation is not demonstrated.  9. The aortic valve is tricuspid. Aortic valve regurgitation is not visualized. Mild aortic valve sclerosis without stenosis. 10. Pulmonic regurgitation is mild. 11. The pulmonic valve was normal in structure. Pulmonic valve regurgitation is mild. 12. 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 13. The inferior vena cava is normal in size with greater than 50% respiratory variability, suggesting right atrial pressure of 3 mmHg. FINDINGS  Left Ventricle: Left ventricular ejection fraction, by visual estimation, is 55 to 60%. The left ventricle has normal function. The left ventricle has no regional wall motion abnormalities. There is moderately increased left ventricular hypertrophy. Left ventricular diastolic parameters are indeterminate. Normal left atrial pressure. Right Ventricle: The right ventricular size is normal. No increase in right ventricular wall thickness. Global RV systolic function is has normal systolic function. Left Atrium: Left atrial size was normal in  size. Right Atrium: Right atrial size was normal in size Pericardium: There is no evidence of pericardial effusion. Mitral Valve: The mitral valve is normal in structure. No evidence of mitral valve regurgitation. No evidence of mitral valve stenosis by observation. Tricuspid Valve: The tricuspid valve is normal in structure. Tricuspid valve regurgitation is not demonstrated. Aortic Valve: The aortic valve is tricuspid. Aortic valve regurgitation is not visualized. Mild aortic valve sclerosis is present, with no evidence of aortic valve stenosis. Pulmonic Valve: The pulmonic valve was normal in structure. Pulmonic valve regurgitation is mild. Pulmonic regurgitation is mild. Aorta: The aortic root, ascending aorta and aortic arch are all structurally  normal, with no evidence of dilitation or obstruction. 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 Venous: The inferior vena cava is normal in size with greater than 50% respiratory variability, suggesting right atrial pressure of 3 mmHg. IAS/Shunts: No atrial level shunt detected by color flow Doppler. There is no evidence of a patent foramen ovale. No ventricular septal defect is seen or detected. There is no evidence of an atrial septal defect.  LEFT VENTRICLE PLAX 2D LVIDd:         3.27 cm LVIDs:         2.56 cm LV PW:         1.44 cm LV IVS:        1.91 cm LVOT diam:     2.70 cm LV SV:         19 ml LV SV Index:   7.58 LVOT Area:     5.73 cm  LEFT ATRIUM         Index LA diam:    3.10 cm 1.26 cm/m   AORTA Ao Root diam: 4.20 cm Ao Asc diam:  4.20 cm MITRAL VALVE MV Area (PHT): 3.91 cm             SHUNTS MV PHT:        56.26 msec           Systemic Diam: 2.70 cm MV Decel Time: 194 msec MV E velocity: 63.10 cm/s 103 cm/s MV A velocity: 56.30 cm/s 70.3 cm/s MV E/A ratio:  1.12       1.5  Candee Furbish MD Electronically signed by Candee Furbish MD Signature Date/Time: 03/23/2019/10:39:34 AM    Final     Assessment/Plan: [name]. is a 78  y.o. male with COPD/pulmonary fibrosis and recently diagnosed hypersensitivity pneumonitis following VATS on 11/19, HTN, DM2, HLD, who was admitted on 11/27 with SOB and hypoxia and was diagnosed with COVID-19, admitted to the ICU for heated high flow.   Disposition: Mr. Iten would benefit from acute rehabilitation to maximize his function. He would be able to tolerate 3 hours of daily therapy. Prior to admission to CIR, he would need to require no more than 5L of oxygen via nasal cannula. He is already more than 20 days from his last positive COVID-19 result (11/27).   Current Therapy: While at [facility], he would benefit from continued PT and OT, with daily sessions of both if possible. His goals are mod I for ambulation and ADLs as he was previously independent and often helped his wife (who has MS) at home.   Sundowning: Reportedly more confused at night. Can try placing family pictures in his room and playing calming music to minimize agitation at night.   Degenerative disc disease: Please provide heating pad prn for lower back pain.  Otherwise he is without pain, sleeping well, and having regular BM.    Izora Ribas, MD 03/24/2019

## 2019-03-24 NOTE — Progress Notes (Addendum)
PROGRESS NOTE    Justin Atkinson.  [id number] DOB: 07/14/1940 DOA: 02/13/2019 PCP: [name], MD  Brief Narrative:  78 year old male with COPD/pulmonary fibrosis, recently diagnosed hypersensitivity pneumonitis following VATS on 11/9, (follows with [name] as an outpatient, recently started on steroids in November), hypertension, type 2 diabetes, hyperlipidemia, was admitted to the hospital on 02/13/2019 with increased shortness of breath and profound hypoxia.  He was diagnosed with COVID-19 initially admitted to the ICU for heated high flow.  Assessment & Plan:   Principal Problem:   Acute respiratory failure with hypoxia (HCC) Active Problems:   Hypertension   Hypercholesterolemia   Benign prostate hyperplasia   ILD (interstitial lung disease) (HCC)   Postinflammatory pulmonary fibrosis (HCC)   HCAP (healthcare-associated pneumonia)   Chronic obstructive pulmonary disease (HCC)   Pneumonia due to COVID-19 virus   Severe sepsis (HCC)   Essential hypertension   Diabetes mellitus type 2, controlled, with complications ([place])   Pulmonary fibrosis (HCC)   Hypersensitivity pneumonitis (HCC)  Acute Hypoxic Respiratory Failure due to Covid-19 Viral Illness -Patient was admitted to the hospital with hypoxic respiratory failure due to COVID-19 pneumonia.  He finished Taelon Bendorf course of 5 days of remdesivir, received convalescent plasma on 11/30 as well as Actemra on 11/29. -He was on heated high flow from admission until ~12/7 - 12/8 per my review of chart and previous documentation of vitals.  His O2 needs don't seem to have changed much since then, continues on NRB and [place]. -continue steroids, will start to taper -Maintain negative fluid balance, continue Lasix (currently ordered daily - he has LE edema) -OOB, IS, prone as able  COVID-19 Labs  Recent Labs    03/22/19 0150 03/23/19 0440 03/24/19 0250  DDIMER 0.71* 0.80* 0.67*  FERRITIN 376* 427* 419*  CRP 0.5 0.6 0.6     Lab Results  Component Value Date   SARSCOV2NAA POSITIVE (Alleene Stoy) 03/02/2019   SARSCOV2NAA NOT DETECTED 02/05/2019   [place] NOT DETECTED 01/09/2019   Pulmonary fibrosis/hypersensitivity pneumonitis -Follows with pulmonology as an outpatient, currently maintained on steroids will likely need Alayjah Boehringer very prolonged steroid taper - Pulm has been reconsulted due to his history of hypersensitivity pneumonitis and prolonged course  Acute on chronic diastolic CHF -No 2D echo in the system as well he had Gelisa Tieken stress test in November 2020 with an EF of 52% -echo 12/21 with normal EF, indeterminate LV diastolic parameters (see report) - IVC is normal with > 50% resp var -LE edema persists -Continue lasix, increase dose to 60 mg BID  Lower extremity swelling -Patient's appreciates his left leg is more swollen than the right, will obtain venous Doppler (negative) -Continue Lasix as above  HCAP/severe sepsis  -Resolved, completed Liyla Radliff course of cefepime.  Monitor off antibiotics  Essential hypertension -His blood pressure medications were on hold, now placed on Lasix, monitor  Type 2 diabetes mellitus -Continue Levemir, mealtime insulin, sliding scale, CBGs fair.  Low fasting BG, will decrease levemir dose.  Thrombocytopenia: follow, stable over several days.  Possibly related to viral illness.  DVT prophylaxis: lovenox Code Status: DNR Family Communication: none at bedside - discussed with wife/daughter 12/18 - called wife 12/19, no answer.  Wife/daughter 12/22. Disposition Plan: pending further improvement  Consultants:   none  Procedures:   none  Antimicrobials:  Anti-infectives (From admission, onward)   Start     Dose/Rate Route Frequency Ordered Stop   03/01/19 0800  remdesivir 100 mg in sodium chloride 0.9 % 250 mL IVPB  100 mg 500 mL/hr over 30 Minutes Intravenous Every 24 hours 02/28/19 0823 03/04/19 1216   02/28/19 1000  remdesivir 200 mg in sodium chloride 0.9 % 250 mL  IVPB     200 mg 500 mL/hr over 30 Minutes Intravenous Once 02/28/19 0823 02/28/19 1006   02/28/19 0000  vancomycin (VANCOCIN) 1,250 mg in sodium chloride 0.9 % 250 mL IVPB  Status:  Discontinued     1,250 mg 166.7 mL/hr over 90 Minutes Intravenous Every 12 hours 02/19/2019 1639 02/25/2019 2037   02/05/2019 2200  ceFEPIme (MAXIPIME) 2 g in sodium chloride 0.9 % 100 mL IVPB  Status:  Discontinued     2 g 200 mL/hr over 30 Minutes Intravenous Every 8 hours 02/24/2019 1629 03/03/19 1242   02/11/2019 1315  ceFEPIme (MAXIPIME) 2 g in sodium chloride 0.9 % 100 mL IVPB     2 g 200 mL/hr over 30 Minutes Intravenous  Once 02/19/2019 1306 02/28/19 0324   02/19/2019 1315  vancomycin (VANCOCIN) 2,000 mg in sodium chloride 0.9 % 500 mL IVPB     2,000 mg 250 mL/hr over 120 Minutes Intravenous  Once 02/18/2019 1311 02/28/19 0324       Subjective: Feels Jawuan Robb little better each day  Objective: Vitals:   03/24/19 0413 03/24/19 0818 03/24/19 0855 03/24/19 1202  BP: (!) 98/57 129/79 129/79 121/71  Pulse: (!) 39 65 75 82  Resp: 19 (!) 22 (!) 22 (!) 21  Temp: 97.8 F (36.6 C) (!) 97.2 F (36.2 C)  (!) 97.2 F (36.2 C)  TempSrc: Oral Axillary  Axillary  SpO2: 96% (!) 87% (!) 71% (!) 83%  Weight:      Height:        Intake/Output Summary (Last 24 hours) at 03/24/2019 1345 Last data filed at 03/24/2019 1117 Gross per 24 hour  Intake 880 ml  Output 1550 ml  Net -670 ml   Filed Weights   02/28/19 0812 03/06/19 1607 03/23/19 0900  Weight: 130.7 kg 124.2 kg 118.2 kg    Examination:  General: No acute distress. Cardiovascular: RRR. Lungs: CTAB Abdomen: Soft, nontender, nondistended  Neurological: Alert and oriented 3. Moves all extremities 4 . Cranial nerves II through XII grossly intact. Skin: Warm and dry. No rashes or lesions. Extremities: No clubbing or cyanosis.bilateral LE edema  Data Reviewed: I have personally reviewed following labs and imaging studies  CBC: Recent Labs  Lab 03/20/19 0024  03/21/19 0101 03/22/19 0150 03/23/19 0440 03/24/19 0250  WBC 10.8* 10.7* 10.5 9.7 8.1  NEUTROABS 9.6* 9.8* 9.4* 8.6* 7.4  HGB 14.1 14.7 15.0 16.4 15.2  HCT 43.8 45.0 45.9 51.3 46.2  MCV 94.2 93.4 93.3 95.5 94.1  PLT 108* 121* 130* 159 [id number]*   Basic Metabolic Panel: Recent Labs  Lab 03/18/19 0120 03/19/19 0250 03/20/19 0024 03/21/19 0101 03/22/19 0150 03/23/19 0440 03/24/19 0250  NA 138 138 137 135 140 138 135  K 4.7 4.6 4.0 4.1 3.8 3.8 4.1  CL 101 96* 96* 92* 95* 89* 90*  CO2 28 30 29 29 30  34* 32  GLUCOSE 132* 114* 118* 172* 93 71 110*  BUN 29* 33* 40* 46* 45* 50* 56*  CREATININE 0.84 0.86 0.96 0.97 0.93 1.02 1.01  CALCIUM 8.9 9.1 9.1 9.2 9.1 9.4 9.0  MG 2.4 2.3 2.3 2.5* 2.5* 2.6* 2.6*  PHOS 3.2 4.0 3.8 3.9  --   --  4.4   GFR: Estimated Creatinine Clearance: 83.8 mL/min (by C-G formula based on SCr of 1.01 mg/dL).  Liver Function Tests: Recent Labs  Lab 03/20/19 0024 03/21/19 0101 03/22/19 0150 03/23/19 0440 03/24/19 0250  AST 20 17 19 25 26   ALT 52* 52* 50* 65* 79*  ALKPHOS 59 59 56 62 54  BILITOT 1.1 0.5 1.1 1.1 1.0  PROT 6.1* 6.1* 6.3* 7.0 6.0*  ALBUMIN 3.4* 3.4* 3.6 3.8 3.3*   No results for input(s): LIPASE, AMYLASE in the last 168 hours. No results for input(s): AMMONIA in the last 168 hours. Coagulation Profile: No results for input(s): INR, PROTIME in the last 168 hours. Cardiac Enzymes: No results for input(s): CKTOTAL, CKMB, CKMBINDEX, TROPONINI in the last 168 hours. BNP (last 3 results) No results for input(s): PROBNP in the last 8760 hours. HbA1C: No results for input(s): HGBA1C in the last 72 hours. CBG: Recent Labs  Lab 03/23/19 1118 03/23/19 1514 03/23/19 2016 03/24/19 0737 03/24/19 1130  GLUCAP 211* 160* 227* 151* 190*   Lipid Profile: No results for input(s): CHOL, HDL, LDLCALC, TRIG, CHOLHDL, LDLDIRECT in the last 72 hours. Thyroid Function Tests: No results for input(s): TSH, T4TOTAL, FREET4, T3FREE, THYROIDAB in the last 72  hours. Anemia Panel: Recent Labs    03/23/19 0440 03/24/19 0250  FERRITIN 427* 419*   Sepsis Labs: No results for input(s): PROCALCITON, LATICACIDVEN in the last 168 hours.  No results found for this or any previous visit (from the past 240 hour(s)).       Radiology Studies: ECHOCARDIOGRAM COMPLETE  Result Date: 03/23/2019   ECHOCARDIOGRAM REPORT   Patient Name:   Berk Basom. Date of Exam: 03/23/2019 Medical Rec #:  [id number]           Height:       73.0 in Accession #:    [id number]          Weight:       273.8 lb Date of Birth:  [birth year]            BSA:          2.46 m Patient Age:    45 years            BP:           133/76 mmHg Patient Gender: M                   HR:           73 bpm. Exam Location:  Inpatient Procedure: 2D Echo Indications:    Elevated brain natriuretic peptide (BNP) level [id number]  History:        Patient has no prior history of Echocardiogram examinations.                 COPD/pulmonary fibrosis,; Risk Factors:Diabetes, Dyslipidemia                 and Hypertension. Acute Hypoxic Respiratory Failure due to                 Covid-19 Viral Illness.  Sonographer:    Darlina Sicilian RDCS Referring Phys: [id number] Moishy Laday CALDWELL POWELL JR  Sonographer Comments: Esmond Plants. Patient sitting upright for oxygen demands IMPRESSIONS  1. Left ventricular ejection fraction, by visual estimation, is 55 to 60%. The left ventricle has normal function. There is moderately increased left ventricular hypertrophy.  2. Left ventricular diastolic parameters are indeterminate.  3. The left ventricle has no regional wall motion abnormalities.  4. Global right ventricle has normal systolic function.The right ventricular size is normal. No increase in  right ventricular wall thickness.  5. Left atrial size was normal.  6. Right atrial size was normal.  7. The mitral valve is normal in structure. No evidence of mitral valve regurgitation. No evidence of mitral stenosis.  8. The tricuspid valve is  normal in structure. Tricuspid valve regurgitation is not demonstrated.  9. The aortic valve is tricuspid. Aortic valve regurgitation is not visualized. Mild aortic valve sclerosis without stenosis. 10. Pulmonic regurgitation is mild. 11. The pulmonic valve was normal in structure. Pulmonic valve regurgitation is mild. 12. 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 13. The inferior vena cava is normal in size with greater than 50% respiratory variability, suggesting right atrial pressure of 3 mmHg. FINDINGS  Left Ventricle: Left ventricular ejection fraction, by visual estimation, is 55 to 60%. The left ventricle has normal function. The left ventricle has no regional wall motion abnormalities. There is moderately increased left ventricular hypertrophy. Left ventricular diastolic parameters are indeterminate. Normal left atrial pressure. Right Ventricle: The right ventricular size is normal. No increase in right ventricular wall thickness. Global RV systolic function is has normal systolic function. Left Atrium: Left atrial size was normal in size. Right Atrium: Right atrial size was normal in size Pericardium: There is no evidence of pericardial effusion. Mitral Valve: The mitral valve is normal in structure. No evidence of mitral valve regurgitation. No evidence of mitral valve stenosis by observation. Tricuspid Valve: The tricuspid valve is normal in structure. Tricuspid valve regurgitation is not demonstrated. Aortic Valve: The aortic valve is tricuspid. Aortic valve regurgitation is not visualized. Mild aortic valve sclerosis is present, with no evidence of aortic valve stenosis. Pulmonic Valve: The pulmonic valve was normal in structure. Pulmonic valve regurgitation is mild. Pulmonic regurgitation is mild. Aorta: The aortic root, ascending aorta and aortic arch are all structurally normal, with no evidence of dilitation or obstruction. There is mild dilatation of the ascending aorta  measuring 42 mm. Venous: The inferior vena cava is normal in size with greater than 50% respiratory variability, suggesting right atrial pressure of 3 mmHg. IAS/Shunts: No atrial level shunt detected by color flow Doppler. There is no evidence of Oluwatosin Higginson patent foramen ovale. No ventricular septal defect is seen or detected. There is no evidence of an atrial septal defect.  LEFT VENTRICLE PLAX 2D LVIDd:         3.27 cm LVIDs:         2.56 cm LV PW:         1.44 cm LV IVS:        1.91 cm LVOT diam:     2.70 cm LV SV:         19 ml LV SV Index:   7.58 LVOT Area:     5.73 cm  LEFT ATRIUM         Index LA diam:    3.10 cm 1.26 cm/m   AORTA Ao Root diam: 4.20 cm Ao Asc diam:  4.20 cm MITRAL VALVE MV Area (PHT): 3.91 cm             SHUNTS MV PHT:        56.26 msec           Systemic Diam: 2.70 cm MV Decel Time: 194 msec MV E velocity: 63.10 cm/s 103 cm/s MV Azekiel Cremer velocity: 56.30 cm/s 70.3 cm/s MV E/Chayden Garrelts ratio:  1.12       1.5  Candee Furbish MD Electronically signed by Candee Furbish MD Signature  Date/Time: 03/23/2019/10:39:34 AM    Final         Scheduled Meds: . aspirin EC  81 mg Oral Daily  . enoxaparin (LOVENOX) injection  60 mg Subcutaneous Q24H  . feeding supplement (ENSURE ENLIVE)  237 mL Oral BID BM  . fluticasone  2 spray Each Nare Daily  . furosemide  60 mg Intravenous BID  . guaiFENesin  1,200 mg Oral BID  . insulin aspart  0-20 Units Subcutaneous TID WC  . insulin aspart  8 Units Subcutaneous TID WC  . insulin detemir  14 Units Subcutaneous Daily  . insulin detemir  5 Units Subcutaneous QHS  . latanoprost  1 drop Both Eyes QHS  . loratadine  10 mg Oral Daily  . mouth rinse  15 mL Mouth Rinse BID  . methylPREDNISolone (SOLU-MEDROL) injection  60 mg Intravenous Q12H  . multivitamin with minerals  1 tablet Oral Daily  . omega-3 acid ethyl esters  1 g Oral Daily  . pantoprazole  40 mg Oral Daily  . QUEtiapine  50 mg Oral QHS  . rosuvastatin  10 mg Oral Daily  . timolol  1 drop Left Eye BID  .  umeclidinium-vilanterol  1 puff Inhalation Daily   Continuous Infusions:   LOS: 25 days    Time spent: over 30 min    Fayrene Helper, MD [facility] Pager AMION  If 7PM-7AM, please contact night-coverage [email or web address] Password [email or web address] 03/24/2019, 1:45 PM

## 2019-03-24 NOTE — Progress Notes (Signed)
Occupational Therapy Treatment Patient Details Name: Justin Atkinson. MRN: [id number] DOB: September 13, 1940 Today's Date: 03/24/2019    History of present illness Pt is a 78 y.o. male recently admitted s/p VATS with lobe wedge resection (02/09/2019), now admitted 02/12/2019 with worsening cough and SOB; tested (+) COVID-19. Other PMH includes COPD/pulmonary fibrosis, cervical DDD, HTN, DM, HLD.   OT comments  Pt continues to make progress in therapy, requiring less supplemental oxygen as compared to previous sessions. Goals updated to reflect progress. Pt currently on 12L HFNC + 15L NRB with SpO2 in low 90s at rest. Pt tolerated standing 1 x 1 min 15 sec and 1 x 1 min with RW and min guard. Pt required mod tactile and verbal cues to maintain upright posture and positioning in standing. [name] SpO2 drops to mid 70s during standing task, requiring 3-5 min seated rest break for return back to 90s. Pt transferred to/from [facility] with min hand held assist with SpO2 decreasing to mid 70s during task. 3/4 DOE. Pt more frustrated this date as he's been in the hospital for 25 days and does not feel he is getting any better. RN already aware. Pt continues to demonstrate decreased strength, endurance, balance, standing tolerance, and activity tolerance impacting ability to complete self-care and functional transfer tasks. Recommend continued skilled OT services to address above deficits in order to promote function and prevent further decline.   Follow Up Recommendations  CIR    Equipment Recommendations  Other (comment)(TBD at next venue of care)    Recommendations for Other Services      Precautions / Restrictions Precautions Precautions: Fall;Other (comment) Precaution Comments: 12L HFNC + 15L NRB, desats quickly down into 70s.  Restrictions Weight Bearing Restrictions: No       Mobility Bed Mobility               General bed mobility comments: Pt sitting in bedside chair upon OT  arrival  Transfers Overall transfer level: Needs assistance Equipment used: Rolling walker (2 wheeled);1 person hand held assist Transfers: Sit to/from Stand;Stand Pivot Transfers Sit to Stand: Min guard Stand pivot transfers: Min guard;Min assist       General transfer comment: Used RW for sit/stand transfers. Hand held assist for stand pivot to [facility]    Balance Overall balance assessment: Needs assistance   Sitting balance-Leahy Scale: Fair       Standing balance-Leahy Scale: Poor                             ADL either performed or assessed with clinical judgement   ADL Overall ADL's : Needs assistance/impaired                         Toilet Transfer: Min guard;BSC;Minimal assistance   Toileting- Clothing Manipulation and Hygiene: Minimal assistance;Sit to/from stand(Assist with toilet hygiene following BM. )       Functional mobility during ADLs: Min guard;Rolling walker;Minimal assistance General ADL Comments: Pt able to transfer to/from bedside commode this date.     Vision       Perception     Praxis      Cognition Arousal/Alertness: Awake/alert Behavior During Therapy: WFL for tasks assessed/performed Overall Cognitive Status: Within Functional Limits for tasks assessed  General Comments: Pt appears to be more frustrated with his situation today. Per pt "I'm going on 25 days now."        Exercises     Shoulder Instructions       General Comments Pt on 12L HFNC + 15L NRB. During activity pt desats into 70s, requiring 3-5 min seated recovery for SpO2 to return back to 90s. Pt more frustrated with his current situation this date. RN aware.     Pertinent Vitals/ Pain       Pain Assessment: No/denies pain  Home Living                                          Prior Functioning/Environment              Frequency           Progress Toward Goals  OT  Goals(current goals can now be found in the care plan section)  Progress towards OT goals: Progressing toward goals  Acute Rehab OT Goals Time For Goal Achievement: 04/07/19 Potential to Achieve Goals: Good ADL Goals Pt Will Transfer to Toilet: ambulating;regular height toilet;with min assist Pt Will Perform Toileting - Clothing Manipulation and hygiene: with supervision;sit to/from stand Additional ADL Goal #2: Pt to tolerate standing up to 5 min with supervision, in preparation for ADLs.  Plan Discharge plan remains appropriate    Co-evaluation                 AM-PAC OT "6 Clicks" Daily Activity     Outcome Measure   Help from another person eating meals?: None Help from another person taking care of personal grooming?: A Little Help from another person toileting, which includes using toliet, bedpan, or urinal?: A Lot Help from another person bathing (including washing, rinsing, drying)?: A Lot Help from another person to put on and taking off regular upper body clothing?: A Lot Help from another person to put on and taking off regular lower body clothing?: A Lot 6 Click Score: 15    End of Session Equipment Utilized During Treatment: Oxygen;Rolling walker  OT Visit Diagnosis: Unsteadiness on feet (R26.81);Muscle weakness (generalized) (M62.81)   Activity Tolerance Patient limited by fatigue(Limited by SOB)   Patient Left in chair;with call bell/phone within reach   Nurse Communication Mobility status        Time: [id number] OT Time Calculation (min): 39 min  Charges: OT General Charges $OT Visit: 1 Visit OT Treatments $Self Care/Home Management : 8-22 mins $Therapeutic Activity: 23-37 mins  Mauri Brooklyn OTR/L [phone]    Mauri Brooklyn 03/24/2019, 1:25 PM

## 2019-03-24 NOTE — Progress Notes (Addendum)
Physical Therapy Treatment Patient Details Name: Justin Atkinson. MRN: [id number] DOB: Apr 17, 1940 Today's Date: 03/24/2019    History of Present Illness Pt is a 78 y.o. male recently admitted s/p VATS with lobe wedge resection (02/09/2019), now admitted 02/02/2019 with worsening cough and SOB; tested (+) COVID-19. Other PMH includes COPD/pulmonary fibrosis, cervical DDD, HTN, DM, HLD.    PT Comments    Pt is very motivated to rehab so that he can go home to his wife and family.  He is tired of this place and frustrated at how slow the recovery is for him.  He was able to stand 4 times, march in place for 20-50 seconds.  The longer he stood and marched the longer his recovery period (10 mins for 50 marches, 5 min for 20 marches) and lowest sat was 69% during mobility and recovery.  95-100% at rest.  Pt on 12 L HFNC and 10 NRB mask during my session.  O2 measured via earlobe probe and nellcor forehead probe.  Earlobe is ~4 points higher than nellcor, but that is close, so I did not change the location of the pedi earlobe probe.  Encouraged frequent, short bouts of exercise when sitting up in the chair including his IS and FV as well as arm raises, seated marches, and LAQs.  He agreed to start trying to move more on his own in his chair during the day. PT will continue to follow acutely for safe mobility progression. Goals updated.  Follow Up Recommendations  CIR;Supervision for mobility/OOB     Equipment Recommendations  Rolling walker with 5" wheels;Other (comment)(likely home O2)    Recommendations for Other Services   NA     Precautions / Restrictions Precautions Precautions: Fall;Other (comment) Precaution Comments: 12L HFNC + 15L NRB, desats quickly down into 70s.     Mobility  Bed Mobility               General bed mobility comments: Pt was OOB in the recliner chair.    Transfers Overall transfer level: Needs assistance Equipment used: Rolling walker (2  wheeled) Transfers: Sit to/from Stand Sit to Stand: Min assist         General transfer comment: Min assist to stand up to RW, safe use of hands on armrests of chair, uncontrolled descent to sit once fatigued.   Ambulation/Gait             General Gait Details: Need second person to ambulate as you need two O2 tanks, one for NRB and one for HFNC.  Pt did march in place x 4 with me.            Balance Overall balance assessment: Needs assistance         Standing balance support: Bilateral upper extremity supported Standing balance-Leahy Scale: Poor Standing balance comment: needs external support in standing from RW, min guard from therapist. Stood x 5, first time stepped in place x 50 reps (too much ~10 mins to recover, droped to the 60s), 2-4 only stepped in place 20 times ~5 mins to recover, dropped into the 70s on NRB and HFNC, earlobe probe and nellcor forehead probe to confirm accuracy.  Earlobe probe is mostly accurate when pt is at rest and not moving around.                              Cognition Arousal/Alertness: Awake/alert Behavior During Therapy: WFL for tasks assessed/performed  Overall Cognitive Status: Within Functional Limits for tasks assessed                                        Exercises Other Exercises Other Exercises: IS and flutter valve x 3 reps x3 sets each. IS max volume is 600 mL, cues to slow intake breath.  Encouraged pt do do something every hour (raise arms, kick legs, march in place in sitting), rest and recover and try something else.          Pertinent Vitals/Pain Pain Assessment: No/denies pain           PT Goals (current goals can now be found in the care plan section) Acute Rehab PT Goals Patient Stated Goal: to get home to his wife Progress towards PT goals: Progressing toward goals    Frequency    Min 3X/week      PT Plan Current plan remains appropriate       AM-PAC PT "6 Clicks"  Mobility   Outcome Measure  Help needed turning from your back to your side while in a flat bed without using bedrails?: A Little Help needed moving from lying on your back to sitting on the side of a flat bed without using bedrails?: A Little Help needed moving to and from a bed to a chair (including a wheelchair)?: A Little Help needed standing up from a chair using your arms (e.g., wheelchair or bedside chair)?: A Little Help needed to walk in hospital room?: A Lot Help needed climbing 3-5 steps with a railing? : Total 6 Click Score: 15    End of Session Equipment Utilized During Treatment: Oxygen Activity Tolerance: Patient limited by fatigue Patient left: in chair;with call bell/phone within reach Nurse Communication: Mobility status PT Visit Diagnosis: Muscle weakness (generalized) (M62.81);Difficulty in walking, not elsewhere classified (R26.2)     Time: [id number] PT Time Calculation (min) (ACUTE ONLY): 62 min  Charges:  $Therapeutic Exercise: 23-37 mins $Therapeutic Activity: 23-37 mins                    Verdene Lennert, PT, DPT  Acute Rehabilitation [phone] pager #[phone] office  @ [facility]: [phone]   03/24/2019, 5:36 PM

## 2019-03-25 LAB — CBC WITH DIFFERENTIAL/PLATELET
Abs Immature Granulocytes: 0.04 10*3/uL (ref 0.00–0.07)
Basophils Absolute: 0 10*3/uL (ref 0.0–0.1)
Basophils Relative: 0 %
Eosinophils Absolute: 0.2 10*3/uL (ref 0.0–0.5)
Eosinophils Relative: 2 %
HCT: 48.9 % (ref 39.0–52.0)
Hemoglobin: 15.9 g/dL (ref 13.0–17.0)
Immature Granulocytes: 0 %
Lymphocytes Relative: 7 %
Lymphs Abs: 0.6 10*3/uL — ABNORMAL LOW (ref 0.7–4.0)
MCH: 30.8 pg (ref 26.0–34.0)
MCHC: 32.5 g/dL (ref 30.0–36.0)
MCV: 94.6 fL (ref 80.0–100.0)
Monocytes Absolute: 0.8 10*3/uL (ref 0.1–1.0)
Monocytes Relative: 9 %
Neutro Abs: 7.7 10*3/uL (ref 1.7–7.7)
Neutrophils Relative %: 82 %
Platelets: 171 10*3/uL (ref 150–400)
RBC: 5.17 MIL/uL (ref 4.22–5.81)
RDW: 13.9 % (ref 11.5–15.5)
WBC: 9.3 10*3/uL (ref 4.0–10.5)
nRBC: 0 % (ref 0.0–0.2)

## 2019-03-25 LAB — COMPREHENSIVE METABOLIC PANEL
ALT: 85 U/L — ABNORMAL HIGH (ref 0–44)
AST: 27 U/L (ref 15–41)
Albumin: 3.5 g/dL (ref 3.5–5.0)
Alkaline Phosphatase: 57 U/L (ref 38–126)
Anion gap: 10 (ref 5–15)
BUN: 56 mg/dL — ABNORMAL HIGH (ref 8–23)
CO2: 35 mmol/L — ABNORMAL HIGH (ref 22–32)
Calcium: 9.3 mg/dL (ref 8.9–10.3)
Chloride: 92 mmol/L — ABNORMAL LOW (ref 98–111)
Creatinine, Ser: 1.03 mg/dL (ref 0.61–1.24)
GFR calc Af Amer: >60 mL/min (ref >60)
GFR calc non Af Amer: >60 mL/min (ref >60)
Glucose, Bld: 59 mg/dL — ABNORMAL LOW (ref 70–99)
Potassium: 4.2 mmol/L (ref 3.5–5.1)
Sodium: 137 mmol/L (ref 135–145)
Total Bilirubin: 1.3 mg/dL — ABNORMAL HIGH (ref 0.3–1.2)
Total Protein: 6.2 g/dL — ABNORMAL LOW (ref 6.5–8.1)

## 2019-03-25 LAB — C-REACTIVE PROTEIN: CRP: 0.7 mg/dL (ref <1.0)

## 2019-03-25 LAB — GLUCOSE, CAPILLARY
Glucose-Capillary: 85 mg/dL (ref 70–99)
Glucose-Capillary: 161 mg/dL — ABNORMAL HIGH (ref 70–99)
Glucose-Capillary: 256 mg/dL — ABNORMAL HIGH (ref 70–99)
Glucose-Capillary: 150 mg/dL — ABNORMAL HIGH (ref 70–99)

## 2019-03-25 LAB — HEPATITIS B SURFACE ANTIBODY, QUANTITATIVE: Hep B S AB Quant (Post): <3.1 m[IU]/mL — ABNORMAL LOW (ref >9.9)

## 2019-03-25 LAB — FERRITIN: Ferritin: 525 ng/mL — ABNORMAL HIGH (ref 24–336)

## 2019-03-25 LAB — MAGNESIUM: Magnesium: 2.6 mg/dL — ABNORMAL HIGH (ref 1.7–2.4)

## 2019-03-25 LAB — PHOSPHORUS: Phosphorus: 4.3 mg/dL (ref 2.5–4.6)

## 2019-03-25 LAB — D-DIMER, QUANTITATIVE: D-Dimer, Quant: 0.8 ug/mL-FEU — ABNORMAL HIGH (ref 0.00–0.50)

## 2019-03-25 MED ORDER — PREDNISONE 20 MG PO TABS
40.0000 mg | ORAL_TABLET | Freq: Every day | ORAL | Status: AC
  Administered 2019-03-26 – 2019-04-01 (×7): 40 mg via ORAL
  Filled 2019-03-25 (×7): qty 2

## 2019-03-25 MED ORDER — INSULIN DETEMIR 100 UNIT/ML ~~LOC~~ SOLN
10.0000 [IU] | Freq: Every day | SUBCUTANEOUS | Status: DC
  Filled 2019-03-25: qty 0.1

## 2019-03-25 MED ORDER — PREDNISONE 20 MG PO TABS: 20.0000 mg | ORAL_TABLET | Freq: Every day | ORAL | Status: DC

## 2019-03-25 MED ORDER — ACETAZOLAMIDE SODIUM 500 MG IJ SOLR
500.0000 mg | Freq: Once | INTRAMUSCULAR | Status: AC
  Administered 2019-03-25: 500 mg via INTRAVENOUS
  Filled 2019-03-25 (×3): qty 500

## 2019-03-25 MED ORDER — POTASSIUM CHLORIDE CRYS ER 20 MEQ PO TBCR
40.0000 meq | EXTENDED_RELEASE_TABLET | Freq: Once | ORAL | Status: AC
  Administered 2019-03-25: 40 meq via ORAL
  Filled 2019-03-25: qty 2

## 2019-03-25 MED ORDER — PREDNISONE 10 MG PO TABS: 10.0000 mg | ORAL_TABLET | Freq: Every day | ORAL | Status: DC

## 2019-03-25 NOTE — Progress Notes (Signed)
While sleeping left side lateral, patient is unable to maintain oxygen sats >80% on only 15L HFNC.  Non-rebreather mask 100% FiO2 added with positive results.  Of note, patient sats 99% on 100% FiO2, difficult to find middle ground for oxygen sats.

## 2019-03-25 NOTE — Progress Notes (Addendum)
PROGRESS NOTE    Justin Atkinson.  [id number] DOB: 1940/11/17 DOA: 02/22/2019 PCP: [name], MD  Brief Narrative:  78 year old male with COPD/pulmonary fibrosis, recently diagnosed hypersensitivity pneumonitis following VATS on 11/9, (follows with [name] as an outpatient, recently started on steroids in November), hypertension, type 2 diabetes, hyperlipidemia, was admitted to the hospital on 02/18/2019 with increased shortness of breath and profound hypoxia.  He was diagnosed with COVID-19 initially admitted to the ICU for heated high flow.  Assessment & Plan:   Principal Problem:   Acute respiratory failure with hypoxia (HCC) Active Problems:   Hypertension   Hypercholesterolemia   Benign prostate hyperplasia   ILD (interstitial lung disease) (HCC)   Postinflammatory pulmonary fibrosis (HCC)   HCAP (healthcare-associated pneumonia)   Chronic obstructive pulmonary disease (HCC)   Pneumonia due to COVID-19 virus   Severe sepsis (HCC)   Essential hypertension   Diabetes mellitus type 2, controlled, with complications ([place])   Pulmonary fibrosis (HCC)   Hypersensitivity pneumonitis (HCC)  Acute Hypoxic Respiratory Failure due to Covid-19 Viral Illness  Pneumomediastinum -Patient was admitted to the hospital with hypoxic respiratory failure due to COVID-19 pneumonia.  He finished Danarius Mcconathy course of 5 days of remdesivir, received convalescent plasma on 11/30 as well as Actemra on 11/29. -He was on heated high flow from admission until ~12/7 - 12/8 per my review of chart and previous documentation of vitals.  His O2 needs don't seem to have changed much since then, continues on NRB and Saxis, relatively stable today. -continue steroids, prednisone 40 mg daily per pulm for now (was on 20 mg daily outpatient prior to admission) -Maintain negative fluid balance, continue Lasix (currently ordered daily - he has LE edema) -OOB, IS, prone as able -CT chest with small pneumomediastinum,  supportive care  COVID-19 Labs  Recent Labs    03/23/19 0440 03/24/19 0250 03/25/19 0400  DDIMER 0.80* 0.67* 0.80*  FERRITIN 427* 419* 525*  CRP 0.6 0.6 0.7    Lab Results  Component Value Date   SARSCOV2NAA POSITIVE (Elsie Sakuma) 02/02/2019   SARSCOV2NAA NOT DETECTED 02/05/2019   [place] NOT DETECTED 01/09/2019   Pulmonary fibrosis/hypersensitivity pneumonitis -Follows with pulmonology as an outpatient, currently maintained on steroids will likely need Justyne Roell very prolonged steroid taper - Pulm has been reconsulted due to his history of hypersensitivity pneumonitis and prolonged course - appreciate recommendations -> CT ches as above, wean steroids to 40 mg daily per pulm (was on 20 mg daily outpatient prior to admission)  Acute on chronic diastolic CHF -No 2D echo in the system as well he had Jatavion Peaster stress test in November 2020 with an EF of 52% -echo 12/21 with normal EF, indeterminate LV diastolic parameters (see report) - IVC is normal with > 50% resp var -LE edema persists, some mild improvement today -Continue lasix, increase dose to 60 mg BID  Lower extremity swelling -Patient's appreciates his left leg is more swollen than the right, will obtain venous Doppler (negative) -Continue Lasix as above  HCAP/severe sepsis  -Resolved, completed Lexandra Rettke course of cefepime.  Monitor off antibiotics  Essential hypertension -His blood pressure medications were on hold, now placed on Lasix, monitor  Type 2 diabetes mellitus  Hypoglycemia -Continue to decrease levemir for hypoglycemia, follow closely  Thrombocytopenia: improved  DVT prophylaxis: lovenox Code Status: DNR Family Communication: none at bedside - wife/daughter 12/23 Disposition Plan: pending further improvement  Consultants:   none  Procedures:   none  Antimicrobials:  Anti-infectives (From admission, onward)  Start     Dose/Rate Route Frequency Ordered Stop   03/01/19 0800  remdesivir 100 mg in sodium chloride 0.9  % 250 mL IVPB     100 mg 500 mL/hr over 30 Minutes Intravenous Every 24 hours 02/28/19 0823 03/04/19 1216   02/28/19 1000  remdesivir 200 mg in sodium chloride 0.9 % 250 mL IVPB     200 mg 500 mL/hr over 30 Minutes Intravenous Once 02/28/19 0823 02/28/19 1006   02/28/19 0000  vancomycin (VANCOCIN) 1,250 mg in sodium chloride 0.9 % 250 mL IVPB  Status:  Discontinued     1,250 mg 166.7 mL/hr over 90 Minutes Intravenous Every 12 hours 02/10/2019 1639 02/28/2019 2037   02/28/2019 2200  ceFEPIme (MAXIPIME) 2 g in sodium chloride 0.9 % 100 mL IVPB  Status:  Discontinued     2 g 200 mL/hr over 30 Minutes Intravenous Every 8 hours 02/19/2019 1629 03/03/19 1242   02/11/2019 1315  ceFEPIme (MAXIPIME) 2 g in sodium chloride 0.9 % 100 mL IVPB     2 g 200 mL/hr over 30 Minutes Intravenous  Once 02/22/2019 1306 02/28/19 0324   03/01/2019 1315  vancomycin (VANCOCIN) 2,000 mg in sodium chloride 0.9 % 500 mL IVPB     2,000 mg 250 mL/hr over 120 Minutes Intravenous  Once 02/03/2019 1311 02/28/19 0324       Subjective: Feeling ok at this time, no new complaints  Objective: Vitals:   03/25/19 0100 03/25/19 0127 03/25/19 0400 03/25/19 0940  BP:   126/89 (!) 144/95  Pulse: 60 (!) 57 64 66  Resp: 19 17 (!) 24 18  Temp:   (!) 97.1 F (36.2 C)   TempSrc:   Axillary   SpO2: (!) 77% 96% 92% (!) 79%  Weight:      Height:        Intake/Output Summary (Last 24 hours) at 03/25/2019 1419 Last data filed at 03/25/2019 0400 Gross per 24 hour  Intake 1490 ml  Output 650 ml  Net 840 ml   Filed Weights   02/28/19 0812 03/06/19 1607 03/23/19 0900  Weight: 130.7 kg 124.2 kg 118.2 kg    Examination:  General: No acute distress. Cardiovascular: RRR Lungs: Clear to auscultation bilaterally Abdomen: Soft, nontender, nondistended  Neurological: Alert and oriented 3. Moves all extremities 4 . Cranial nerves II through XII grossly intact. Skin: Warm and dry. No rashes or lesions. Extremities: No clubbing or cyanosis.  Bilateral LE edema, appears to be improving   Data Reviewed: I have personally reviewed following labs and imaging studies  CBC: Recent Labs  Lab 03/21/19 0101 03/22/19 0150 03/23/19 0440 03/24/19 0250 03/25/19 0400  WBC 10.7* 10.5 9.7 8.1 9.3  NEUTROABS 9.8* 9.4* 8.6* 7.4 7.7  HGB 14.7 15.0 16.4 15.2 15.9  HCT 45.0 45.9 51.3 46.2 48.9  MCV 93.4 93.3 95.5 94.1 94.6  PLT 121* 130* 159 135* [id number]   Basic Metabolic Panel: Recent Labs  Lab 03/19/19 0250 03/20/19 0024 03/21/19 0101 03/22/19 0150 03/23/19 0440 03/24/19 0250 03/25/19 0400  NA 138 137 135 140 138 135 137  K 4.6 4.0 4.1 3.8 3.8 4.1 4.2  CL 96* 96* 92* 95* 89* 90* 92*  CO2 30 29 29 30  34* 32 35*  GLUCOSE 114* 118* 172* 93 71 110* 59*  BUN 33* 40* 46* 45* 50* 56* 56*  CREATININE 0.86 0.96 0.97 0.93 1.02 1.01 1.03  CALCIUM 9.1 9.1 9.2 9.1 9.4 9.0 9.3  MG 2.3 2.3 2.5* 2.5* 2.6* 2.6*  2.6*  PHOS 4.0 3.8 3.9  --   --  4.4 4.3   GFR: Estimated Creatinine Clearance: 82.2 mL/min (by C-G formula based on SCr of 1.03 mg/dL). Liver Function Tests: Recent Labs  Lab 03/21/19 0101 03/22/19 0150 03/23/19 0440 03/24/19 0250 03/25/19 0400  AST 17 19 25 26 27   ALT 52* 50* 65* 79* 85*  ALKPHOS 59 56 62 54 57  BILITOT 0.5 1.1 1.1 1.0 1.3*  PROT 6.1* 6.3* 7.0 6.0* 6.2*  ALBUMIN 3.4* 3.6 3.8 3.3* 3.5   No results for input(s): LIPASE, AMYLASE in the last 168 hours. No results for input(s): AMMONIA in the last 168 hours. Coagulation Profile: No results for input(s): INR, PROTIME in the last 168 hours. Cardiac Enzymes: No results for input(s): CKTOTAL, CKMB, CKMBINDEX, TROPONINI in the last 168 hours. BNP (last 3 results) No results for input(s): PROBNP in the last 8760 hours. HbA1C: No results for input(s): HGBA1C in the last 72 hours. CBG: Recent Labs  Lab 03/24/19 1130 03/24/19 1627 03/24/19 2051 03/25/19 0755 03/25/19 1118  GLUCAP 190* 166* 173* 85 161*   Lipid Profile: No results for input(s): CHOL,  HDL, LDLCALC, TRIG, CHOLHDL, LDLDIRECT in the last 72 hours. Thyroid Function Tests: No results for input(s): TSH, T4TOTAL, FREET4, T3FREE, THYROIDAB in the last 72 hours. Anemia Panel: Recent Labs    03/24/19 0250 03/25/19 0400  FERRITIN 419* 525*   Sepsis Labs: No results for input(s): PROCALCITON, LATICACIDVEN in the last 168 hours.  No results found for this or any previous visit (from the past 240 hour(s)).       Radiology Studies: CT CHEST WO CONTRAST  Result Date: 03/24/2019 CLINICAL DATA:  78 year old male with left lung atelectasis. EXAM: CT CHEST WITHOUT CONTRAST TECHNIQUE: Multidetector CT imaging of the chest was performed following the standard protocol without IV contrast. COMPARISON:  Chest CT dated 02/25/2019. FINDINGS: Evaluation of this exam is limited in the absence of intravenous contrast. Evaluation is also limited due to respiratory motion artifact. Cardiovascular: There is mild cardiomegaly. No pericardial effusion. The thoracic aorta and central pulmonary arteries are grossly unremarkable. Mediastinum/Nodes: No hilar or mediastinal adenopathy. The esophagus and the thyroid gland are grossly unremarkable. No mediastinal fluid collection. There has been interval development of small pneumomediastinum. Clinical correlation is recommended. Lungs/Pleura: There is background of emphysema. Diffuse interstitial coarsening and reticular densities primarily involving the lung bases noted which may represent pneumonia. Overall there has been slight interval progression of pulmonary densities since the prior CT. Clinical correlation and follow-up to solution recommended. There is no pleural effusion or pneumothorax. The central airways remain patent. 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 Musculoskeletal: Degenerative changes of the spine. No acute osseous pathology. IMPRESSION: 1. Interval  development of small pneumomediastinum. 2. Diffuse interstitial and airspace densities slightly progressed since the prior CT. Clinical correlation and follow-up to solution recommended. 3. Aortic Atherosclerosis (ICD10-I70.0) and Emphysema (ICD10-J43.9). Electronically Signed   By: Anner Crete M.D.   On: 03/24/2019 19:34        Scheduled Meds: . aspirin EC  81 mg Oral Daily  . enoxaparin (LOVENOX) injection  60 mg Subcutaneous Q24H  . feeding supplement (ENSURE ENLIVE)  237 mL Oral BID BM  . fluticasone  2 spray Each Nare Daily  . furosemide  60 mg Intravenous BID  . guaiFENesin  1,200 mg Oral BID  . insulin aspart  0-20 Units Subcutaneous TID WC  .  insulin aspart  8 Units Subcutaneous TID WC  . [START ON 03/26/2019] insulin detemir  10 Units Subcutaneous Daily  . latanoprost  1 drop Both Eyes QHS  . loratadine  10 mg Oral Daily  . mouth rinse  15 mL Mouth Rinse BID  . multivitamin with minerals  1 tablet Oral Daily  . omega-3 acid ethyl esters  1 g Oral Daily  . pantoprazole  40 mg Oral Daily  . predniSONE  40 mg Oral Q breakfast  . QUEtiapine  50 mg Oral QHS  . rosuvastatin  10 mg Oral Daily  . timolol  1 drop Left Eye BID  . umeclidinium-vilanterol  1 puff Inhalation Daily   Continuous Infusions:   LOS: 26 days    Time spent: over 30 min    Fayrene Helper, MD [facility] Pager AMION  If 7PM-7AM, please contact night-coverage [email or web address] Password [facility] 03/25/2019, 2:19 PM

## 2019-03-25 NOTE — Progress Notes (Signed)
Family Update:  I called Marlis Edelson, the patient's daughter to update her about her father's care. I answered all questions and gave the family member updates to the plan of care set ahead for this patient.

## 2019-03-25 NOTE — Progress Notes (Signed)
Inpatient Rehabilitation Admissions Coordinator  Dr Ranell Patrick consulted 12/22 for physiatry. Patient not yet at a level to tolerate inpt rehab on current 15 L HFNC. I have followed pt's progress since 12/17. I will continue to follow.  Danne Baxter, RN, MSN Rehab Admissions Coordinator [phone] 03/25/2019 8:57 AM

## 2019-03-25 NOTE — Progress Notes (Signed)
Pulmonary Note S: No major changes overnight.  Still not using his IS like he should.  High O2 requirements persist.  O: Today's Vitals   03/25/19 0400 03/25/19 0800 03/25/19 0900 03/25/19 0940  BP: 126/89   (!) 144/95  Pulse: 64  (!) 114 66  Resp: (!) 24  20 18   Temp: (!) 97.1 F (36.2 C)  97.7 F (36.5 C)   TempSrc: Axillary  Oral   SpO2: 92%  (!) 66% (!) 79%  Weight:      Height:      PainSc:  4      Body mass index is 34.38 kg/m. GEN: Elderly man in no acute distress HEENT: Mucous membranes moist, trachea midline CV: Heart sounds regular, extremities warm PULM: He is occasional crackles at bases, no wheezing, no accessory muscle use GI: Abdomen soft, hypoactive bowel sounds EXT: He has 2+ pitting edema to thighs NEURO: Moves all 4 extremities to command PSYCH: Alert and oriented x3 SKIN: Pale  Cr steady HCO3 rising Net even yesterday CT reviewed Surg path reviewed: interstitial inflammation, emphysema, rare noncaseating granulomas  A: Persistent hypoxemia after COVID infection: after review of everything I suspect he has combination of following: - 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  Steroids can actually worsen this.  I am not sure we are going to be able to draw off more with lasix at this time but worth a shot - Severe deconditioning from prolonged hospital stay, prolonged steroid course  (note he was hospitalized for his VATS only a few weeks before) - Pneumomediastinum mild related to acute covid infection  P: - Re-emphasized need for incentive spirometry use - Check MBSS as pattern for occult aspiration seen on path can be identical to HSP -  Continue anoro - Continue aggressive PT/OT - Consider TED stockings to aid with fluid offloading - Encourage PO including ensure - Taper prednisone down to 10mg /day over 2 weeks as ordered - Continue lasix until Cr bumps, added dose of diamox to reduce contraction alkalosis - If he can get to point of discharge, have him follow up with [name] in 2 weeks  Please call if any further questions or concerns.  Erskine Emery MD PCCM

## 2019-03-26 ENCOUNTER — Inpatient Hospital Stay (HOSPITAL_COMMUNITY): Payer: Medicare Other

## 2019-03-26 LAB — COMPREHENSIVE METABOLIC PANEL
ALT: 70 U/L — ABNORMAL HIGH (ref 0–44)
AST: 18 U/L (ref 15–41)
Albumin: 3.3 g/dL — ABNORMAL LOW (ref 3.5–5.0)
Alkaline Phosphatase: 57 U/L (ref 38–126)
Anion gap: 13 (ref 5–15)
BUN: 53 mg/dL — ABNORMAL HIGH (ref 8–23)
CO2: 34 mmol/L — ABNORMAL HIGH (ref 22–32)
Calcium: 8.9 mg/dL (ref 8.9–10.3)
Chloride: 91 mmol/L — ABNORMAL LOW (ref 98–111)
Creatinine, Ser: 1.13 mg/dL (ref 0.61–1.24)
GFR calc Af Amer: >60 mL/min (ref >60)
GFR calc non Af Amer: >60 mL/min (ref >60)
Glucose, Bld: 45 mg/dL — ABNORMAL LOW (ref 70–99)
Potassium: 3.2 mmol/L — ABNORMAL LOW (ref 3.5–5.1)
Sodium: 138 mmol/L (ref 135–145)
Total Bilirubin: 1.3 mg/dL — ABNORMAL HIGH (ref 0.3–1.2)
Total Protein: 5.9 g/dL — ABNORMAL LOW (ref 6.5–8.1)

## 2019-03-26 LAB — CBC WITH DIFFERENTIAL/PLATELET
Abs Immature Granulocytes: 0.04 10*3/uL (ref 0.00–0.07)
Basophils Absolute: 0 10*3/uL (ref 0.0–0.1)
Basophils Relative: 0 %
Eosinophils Absolute: 0.3 10*3/uL (ref 0.0–0.5)
Eosinophils Relative: 3 %
HCT: 45.7 % (ref 39.0–52.0)
Hemoglobin: 14.7 g/dL (ref 13.0–17.0)
Immature Granulocytes: 1 %
Lymphocytes Relative: 6 %
Lymphs Abs: 0.6 10*3/uL — ABNORMAL LOW (ref 0.7–4.0)
MCH: 30.6 pg (ref 26.0–34.0)
MCHC: 32.2 g/dL (ref 30.0–36.0)
MCV: 95 fL (ref 80.0–100.0)
Monocytes Absolute: 0.9 10*3/uL (ref 0.1–1.0)
Monocytes Relative: 10 %
Neutro Abs: 7.1 10*3/uL (ref 1.7–7.7)
Neutrophils Relative %: 80 %
Platelets: 162 10*3/uL (ref 150–400)
RBC: 4.81 MIL/uL (ref 4.22–5.81)
RDW: 13.7 % (ref 11.5–15.5)
WBC: 8.8 10*3/uL (ref 4.0–10.5)
nRBC: 0 % (ref 0.0–0.2)

## 2019-03-26 LAB — PREALBUMIN: Prealbumin: 31.9 mg/dL (ref 18–38)

## 2019-03-26 LAB — GLUCOSE, CAPILLARY
Glucose-Capillary: 95 mg/dL (ref 70–99)
Glucose-Capillary: 158 mg/dL — ABNORMAL HIGH (ref 70–99)
Glucose-Capillary: 211 mg/dL — ABNORMAL HIGH (ref 70–99)
Glucose-Capillary: 163 mg/dL — ABNORMAL HIGH (ref 70–99)

## 2019-03-26 LAB — C-REACTIVE PROTEIN: CRP: 1.7 mg/dL — ABNORMAL HIGH (ref <1.0)

## 2019-03-26 LAB — D-DIMER, QUANTITATIVE: D-Dimer, Quant: 0.78 ug/mL-FEU — ABNORMAL HIGH (ref 0.00–0.50)

## 2019-03-26 LAB — FERRITIN: Ferritin: 540 ng/mL — ABNORMAL HIGH (ref 24–336)

## 2019-03-26 LAB — PHOSPHORUS: Phosphorus: 4.2 mg/dL (ref 2.5–4.6)

## 2019-03-26 LAB — MAGNESIUM: Magnesium: 2.6 mg/dL — ABNORMAL HIGH (ref 1.7–2.4)

## 2019-03-26 MED ORDER — POTASSIUM CHLORIDE CRYS ER 20 MEQ PO TBCR
40.0000 meq | EXTENDED_RELEASE_TABLET | ORAL | Status: AC
  Administered 2019-03-26 (×3): 40 meq via ORAL
  Filled 2019-03-26 (×3): qty 2

## 2019-03-26 NOTE — Progress Notes (Signed)
Physical Therapy Treatment Patient Details Name: Justin Atkinson. MRN: [id number] DOB: 1940-06-07 Today's Date: 03/26/2019    History of Present Illness Pt is a 78 y.o. male recently admitted s/p VATS with lobe wedge resection (02/09/2019), now admitted 02/26/2019 with worsening cough and SOB; tested (+) COVID-19. Other PMH includes COPD/pulmonary fibrosis, cervical DDD, HTN, DM, HLD.    PT Comments    Patient requiring intermittent use of NRB-15 L and HFNC @ 15 L for recovery when transfers. Ear probe used for sensing. continue progressive activity.    Follow Up Recommendations  CIR;Supervision for mobility/OOB     Equipment Recommendations  Rolling walker with 5" wheels;Other (comment)    Recommendations for Other Services Rehab consult     Precautions / Restrictions Precautions Precautions: None Precaution Comments: 12L HFNC + 15L NRB, desats quickly down into 70s.     Mobility  Bed Mobility               General bed mobility comments: Pt was OOB in the recliner chair.    Transfers Overall transfer level: Needs assistance Equipment used: 1 person hand held assist Transfers: Sit to/from [employer] Transfers Sit to Stand: +2 safety/equipment;Min assist Stand pivot transfers: From elevated surface       General transfer comment: min steady assist from higher seat, pivot steps to recliner, decreased control of descent.  Ambulation/Gait                 Stairs             Wheelchair Mobility    Modified Rankin (Stroke Patients Only)       Balance     Sitting balance-Leahy Scale: Fair     Standing balance support: Bilateral upper extremity supported Standing balance-Leahy Scale: Poor Standing balance comment: needs external support in standing                            Cognition Arousal/Alertness: Awake/alert                                     General Comments: pt calmer, particioated in MBS  even though he reports being claustrophobic      Exercises      General Comments        Pertinent Vitals/Pain Faces Pain Scale: Hurts a little bit Pain Location: generalized Pain Descriptors / Indicators: Discomfort Pain Intervention(s): Monitored during session    Home Living                      Prior Function            PT Goals (current goals can now be found in the care plan section) Progress towards PT goals: Progressing toward goals    Frequency    Min 3X/week      PT Plan Current plan remains appropriate    Co-evaluation              AM-PAC PT "6 Clicks" Mobility   Outcome Measure  Help needed turning from your back to your side while in a flat bed without using bedrails?: A Little Help needed moving from lying on your back to sitting on the side of a flat bed without using bedrails?: A Little Help needed moving to and from a bed to a chair (including a wheelchair)?: A Lot  Help needed standing up from a chair using your arms (e.g., wheelchair or bedside chair)?: A Lot Help needed to walk in hospital room?: A Lot Help needed climbing 3-5 steps with a railing? : Total 6 Click Score: 13    End of Session Equipment Utilized During Treatment: Oxygen;Gait belt Activity Tolerance: Patient limited by fatigue Patient left: in chair;with call bell/phone within reach Nurse Communication: Mobility status PT Visit Diagnosis: Muscle weakness (generalized) (M62.81);Difficulty in walking, not elsewhere classified (R26.2)     Time: [id number] PT Time Calculation (min) (ACUTE ONLY): 25 min  Charges:  $Therapeutic Activity: 23-37 mins                     Tresa Endo PT Acute Rehabilitation Services Pager [phone] Office [phone]    Claretha Cooper 03/26/2019, 4:19 PM

## 2019-03-26 NOTE — Progress Notes (Signed)
Modified Barium Swallow Progress Note  Patient Details  Name: Justin Atkinson. MRN: [id number] Date of Birth: [birth year]  Today's Date: 03/26/2019  Modified Barium Swallow completed.  Full report located under Chart Review in the Imaging Section.  Brief recommendations include the following:  Clinical Impression  Pt has a moderate pharyngeal dysphagia that appears to be acute on chronic in nature given the appearance of large, bridging osteophytes that protrude his posterior pharyngeal wall and limit full range of epiglottic deflection. Throughout the study pt was on 15L via HFNC, needing intermittent use of NRB for desaturations (lowest SpO2 reading in the 70s after transferring, for which PT was present to assist). Thin liquids are seen entering the laryngeal vestibule as he swallows given incomplete LVC, and although visibility is limited at times given shadow from shoulder, there is defiinitive aspiration noted. Pt only coughed x1 when aspirating a larger volume, and his cough did not clear his airway effectively. Nectar thick liquids are also penetrated but exit the laryngeal vestibule consistently. Pt has vallecular residue across consistencies, although this was not observed to enter the airway during testing and was reduced as he subsequent swallows were performed. A chin tuck did not improve swallowing safety or efficiency (in fact, it may have worsened it), but a head turn to the right helped him swallow thin liquids even via large, consecutive straw sips.    Given the silent nature of his aspriation and the primary structural component it is possible that the pt has been aspirating more chronically PTA, although he denies a h/o dysphagia or PNA. In the setting of acute illness and respiratory decline, I suspect that he may be aspirating more (volume or frequency) and/or he may not be able to compensate for or tolerate the aspiration that occurs. Extensive education was provided after the  study back in his room, at which time I reviewed the above. We discussed options such as continuing thin liquids with a head turn to the right, needing to try to be very consistent in the use of this strategy. He could also consider a trial of nectar thick liquids (perhaps with water protocol) if he feels like he will not be able to use the head turn as much as he will need to. At this time, pt would like to try to stay on regular solids and thin liquids with use of strategies. SLP provided signage at his [facility] and in front of him where he can see it to help serve as a reminder. Will need additional SLP f/u for safety and to maximize swallow function.   Swallow Evaluation Recommendations       SLP Diet Recommendations: Regular solids;Thin liquid   Liquid Administration via: Cup;Straw   Medication Administration: Whole meds with puree   Supervision: Patient able to self feed;Intermittent supervision to cue for compensatory strategies   Compensations: Slow rate;Small sips/bites;Other (Comment)(turn head to the RIGHT while swallowing)   Postural Changes: Seated upright at 90 degrees;Remain semi-upright after after feeds/meals (Comment)   Oral Care Recommendations: Oral care BID       03/26/2019,9:49 AM   Osie Bond., M.A. [place] Acute Environmental education officer [phone] Office [phone]

## 2019-03-26 NOTE — Progress Notes (Addendum)
PROGRESS NOTE    Justin Atkinson.  [id number] DOB: 1940/11/30 DOA: 02/08/2019 PCP: [name], MD  Brief Narrative:  78 year old male with COPD/pulmonary fibrosis, recently diagnosed hypersensitivity pneumonitis following VATS on 11/9, (follows with [name] as an outpatient, recently started on steroids in November), hypertension, type 2 diabetes, hyperlipidemia, was admitted to the hospital on 02/01/2019 with increased shortness of breath and profound hypoxia.  He was diagnosed with COVID-19 initially admitted to the ICU for heated high flow.  Assessment & Plan:   Principal Problem:   Acute respiratory failure with hypoxia (HCC) Active Problems:   Hypertension   Hypercholesterolemia   Benign prostate hyperplasia   ILD (interstitial lung disease) (HCC)   Postinflammatory pulmonary fibrosis (HCC)   HCAP (healthcare-associated pneumonia)   Chronic obstructive pulmonary disease (HCC)   Pneumonia due to COVID-19 virus   Severe sepsis (HCC)   Essential hypertension   Diabetes mellitus type 2, controlled, with complications ([place])   Pulmonary fibrosis (HCC)   Hypersensitivity pneumonitis (HCC)  Acute Hypoxic Respiratory Failure due to Covid-19 Viral Illness  Pneumomediastinum -Patient was admitted to the hospital with hypoxic respiratory failure due to COVID-19 pneumonia.  He finished Halena Mohar course of 5 days of remdesivir, received convalescent plasma on 11/30 as well as Actemra on 11/29. -He was on heated high flow from admission until ~12/7 - 12/8 per my review of chart and previous documentation of vitals.  Charted on 10 L HFNC (when I saw him this morning he was on 13 L [place] without the mask).  He's been on NRB and 15 L for several days now. -continue steroids, prednisone taper per pulmonology -Maintain negative fluid balance, continue Lasix (currently ordered daily - he has LE edema) -OOB, IS, prone as able -CT chest with small pneumomediastinum, supportive care - follow  intermittent CXR  COVID-19 Labs  Recent Labs    03/24/19 0250 03/25/19 0400 03/26/19 0255  DDIMER 0.67* 0.80* 0.78*  FERRITIN 419* 525* 540*  CRP 0.6 0.7 1.7*    Lab Results  Component Value Date   SARSCOV2NAA POSITIVE (Marvis Saefong) 02/21/2019   SARSCOV2NAA NOT DETECTED 02/05/2019   [place] NOT DETECTED 01/09/2019   Pulmonary fibrosis/hypersensitivity pneumonitis -Follows with pulmonology as an outpatient, currently maintained on steroids will likely need Nadiyah Zeis very prolonged steroid taper - Pulm has been reconsulted due to his history of hypersensitivity pneumonitis and prolonged course - appreciate recommendations -> CT ches as above, wean steroids per pulm (was on 20 mg daily outpatient prior to admission) - MBS with silent aspiration (per pulm, can be similar to HSP on path)  Acute on chronic diastolic CHF -No 2D echo in the system as well he had Edlyn Rosenburg stress test in November 2020 with an EF of 52% -echo 12/21 with normal EF, indeterminate LV diastolic parameters (see report) - IVC is normal with > 50% resp var -LE edema persists, some mild improvement today -Continue lasix, increase dose to 60 mg BID  Lower extremity swelling -Patient's appreciates his left leg is more swollen than the right, will obtain venous Doppler (negative) -Continue Lasix as above  HCAP/severe sepsis  -Resolved, completed Johnnell Liou course of cefepime.  Monitor off antibiotics  Essential hypertension -His blood pressure medications were on hold, now placed on Lasix, monitor  Type 2 diabetes mellitus  Hypoglycemia - AM hypoglycemia, hold basal insulin, continue mealtime  Thrombocytopenia: improved  Elevated Creatinine: follow closely with diuresis, may have to hold lasix in Marthann Abshier day or so  Hypokalemia: replace and follow  DVT  prophylaxis: lovenox Code Status: DNR Family Communication: none at bedside - wife 12/24 Disposition Plan: pending further improvement  Consultants:   none  Procedures:    none  Antimicrobials:  Anti-infectives (From admission, onward)   Start     Dose/Rate Route Frequency Ordered Stop   03/01/19 0800  remdesivir 100 mg in sodium chloride 0.9 % 250 mL IVPB     100 mg 500 mL/hr over 30 Minutes Intravenous Every 24 hours 02/28/19 0823 03/04/19 1216   02/28/19 1000  remdesivir 200 mg in sodium chloride 0.9 % 250 mL IVPB     200 mg 500 mL/hr over 30 Minutes Intravenous Once 02/28/19 0823 02/28/19 1006   02/28/19 0000  vancomycin (VANCOCIN) 1,250 mg in sodium chloride 0.9 % 250 mL IVPB  Status:  Discontinued     1,250 mg 166.7 mL/hr over 90 Minutes Intravenous Every 12 hours 02/13/2019 1639 02/03/2019 2037   03/02/2019 2200  ceFEPIme (MAXIPIME) 2 g in sodium chloride 0.9 % 100 mL IVPB  Status:  Discontinued     2 g 200 mL/hr over 30 Minutes Intravenous Every 8 hours 02/02/2019 1629 03/03/19 1242   02/26/2019 1315  ceFEPIme (MAXIPIME) 2 g in sodium chloride 0.9 % 100 mL IVPB     2 g 200 mL/hr over 30 Minutes Intravenous  Once 03/02/2019 1306 02/28/19 0324   02/23/2019 1315  vancomycin (VANCOCIN) 2,000 mg in sodium chloride 0.9 % 500 mL IVPB     2,000 mg 250 mL/hr over 120 Minutes Intravenous  Once 02/10/2019 1311 02/28/19 0324       Subjective: No new complaints Feels tired  Objective: Vitals:   03/26/19 0948 03/26/19 1000 03/26/19 1100 03/26/19 1149  BP: 115/68   108/69  Pulse:  82 66 68  Resp:  (!) 23 20 (!) 25  Temp: (!) 97.2 F (36.2 C)   97.6 F (36.4 C)  TempSrc:    Oral  SpO2:  98% 98% 93%  Weight:      Height:        Intake/Output Summary (Last 24 hours) at 03/26/2019 1416 Last data filed at 03/25/2019 1859 Gross per 24 hour  Intake 890 ml  Output 750 ml  Net 140 ml   Filed Weights   02/28/19 0812 03/06/19 1607 03/23/19 0900  Weight: 130.7 kg 124.2 kg 118.2 kg    Examination:  General: No acute distress. Cardiovascular: RRR Lungs: CTAB, unlabored Abdomen: Soft, nontender, nondistended  Neurological: Alert and oriented 3. Moves all  extremities 4. Cranial nerves II through XII grossly intact. Skin: Warm and dry. No rashes or lesions. Extremities: No clubbing or cyanosis. Bilateral LE edema   Data Reviewed: I have personally reviewed following labs and imaging studies  CBC: Recent Labs  Lab 03/22/19 0150 03/23/19 0440 03/24/19 0250 03/25/19 0400 03/26/19 0255  WBC 10.5 9.7 8.1 9.3 8.8  NEUTROABS 9.4* 8.6* 7.4 7.7 7.1  HGB 15.0 16.4 15.2 15.9 14.7  HCT 45.9 51.3 46.2 48.9 45.7  MCV 93.3 95.5 94.1 94.6 95.0  PLT 130* 159 135* 171 0000000   Basic Metabolic Panel: Recent Labs  Lab 03/20/19 0024 03/21/19 0101 03/22/19 0150 03/23/19 0440 03/24/19 0250 03/25/19 0400 03/26/19 0255  NA 137 135 140 138 135 137 138  K 4.0 4.1 3.8 3.8 4.1 4.2 3.2*  CL 96* 92* 95* 89* 90* 92* 91*  CO2 29 29 30  34* 32 35* 34*  GLUCOSE 118* 172* 93 71 110* 59* 45*  BUN 40* 46* 45* 50* 56* 56*  53*  CREATININE 0.96 0.97 0.93 1.02 1.01 1.03 1.13  CALCIUM 9.1 9.2 9.1 9.4 9.0 9.3 8.9  MG 2.3 2.5* 2.5* 2.6* 2.6* 2.6* 2.6*  PHOS 3.8 3.9  --   --  4.4 4.3 4.2   GFR: Estimated Creatinine Clearance: 74.9 mL/min (by C-G formula based on SCr of 1.13 mg/dL). Liver Function Tests: Recent Labs  Lab 03/22/19 0150 03/23/19 0440 03/24/19 0250 03/25/19 0400 03/26/19 0255  AST 19 25 26 27 18   ALT 50* 65* 79* 85* 70*  ALKPHOS 56 62 54 57 57  BILITOT 1.1 1.1 1.0 1.3* 1.3*  PROT 6.3* 7.0 6.0* 6.2* 5.9*  ALBUMIN 3.6 3.8 3.3* 3.5 3.3*   No results for input(s): LIPASE, AMYLASE in the last 168 hours. No results for input(s): AMMONIA in the last 168 hours. Coagulation Profile: No results for input(s): INR, PROTIME in the last 168 hours. Cardiac Enzymes: No results for input(s): CKTOTAL, CKMB, CKMBINDEX, TROPONINI in the last 168 hours. BNP (last 3 results) No results for input(s): PROBNP in the last 8760 hours. HbA1C: No results for input(s): HGBA1C in the last 72 hours. CBG: Recent Labs  Lab 03/25/19 1118 03/25/19 1612  03/25/19 2045 03/26/19 0748 03/26/19 1145  GLUCAP 161* 256* 150* 95 158*   Lipid Profile: No results for input(s): CHOL, HDL, LDLCALC, TRIG, CHOLHDL, LDLDIRECT in the last 72 hours. Thyroid Function Tests: No results for input(s): TSH, T4TOTAL, FREET4, T3FREE, THYROIDAB in the last 72 hours. Anemia Panel: Recent Labs    03/25/19 0400 03/26/19 0255  FERRITIN 525* 540*   Sepsis Labs: No results for input(s): PROCALCITON, LATICACIDVEN in the last 168 hours.  No results found for this or any previous visit (from the past 240 hour(s)).       Radiology Studies: CT CHEST WO CONTRAST  Result Date: 03/24/2019 CLINICAL DATA:  78 year old male with left lung atelectasis. EXAM: CT CHEST WITHOUT CONTRAST TECHNIQUE: Multidetector CT imaging of the chest was performed following the standard protocol without IV contrast. COMPARISON:  Chest CT dated 02/15/2019. FINDINGS: Evaluation of this exam is limited in the absence of intravenous contrast. Evaluation is also limited due to respiratory motion artifact. Cardiovascular: There is mild cardiomegaly. No pericardial effusion. The thoracic aorta and central pulmonary arteries are grossly unremarkable. Mediastinum/Nodes: No hilar or mediastinal adenopathy. The esophagus and the thyroid gland are grossly unremarkable. No mediastinal fluid collection. There has been interval development of small pneumomediastinum. Clinical correlation is recommended. Lungs/Pleura: There is background of emphysema. Diffuse interstitial coarsening and reticular densities primarily involving the lung bases noted which may represent pneumonia. Overall there has been slight interval progression of pulmonary densities since the prior CT. Clinical correlation and follow-up to solution recommended. There is no pleural effusion or pneumothorax. The central airways remain patent. Upper Abdomen: Kaeden Mester 5.5 cm exophytic low attenuating lesion from the right kidney suboptimally characterized,  possibly Donisha Hoch cyst. Musculoskeletal: Degenerative changes of the spine. No acute osseous pathology. IMPRESSION: 1. Interval development of small pneumomediastinum. 2. Diffuse interstitial and airspace densities slightly progressed since the prior CT. Clinical correlation and follow-up to solution recommended. 3. Aortic Atherosclerosis (ICD10-I70.0) and Emphysema (ICD10-J43.9). Electronically Signed   By: Anner Crete M.D.   On: 03/24/2019 19:34   DG Swallowing Func-Speech Pathology  Result Date: 03/26/2019 Objective Swallowing Evaluation: Type of Study: MBS-Modified Barium Swallow Study  Patient Details Name: Justin Atkinson. MRN: [id number] Date of Birth: 11-22-1940 Today's Date: 03/26/2019 Time: SLP Start Time (ACUTE ONLY): 0800 -SLP Stop Time (ACUTE ONLY):  0902 SLP Time Calculation (min) (ACUTE ONLY): 62 min Past Medical History: Past Medical History: Diagnosis Date . Bladder cancer [facility]) 2001  Surgery . Complication of anesthesia 2001  woke up during colonoscopy . COPD (chronic obstructive pulmonary disease) ([place])   "beginning Emphesma" . DDD (degenerative disc disease), cervical  . Diabetes mellitus without complication ([place])  . DJD (degenerative joint disease)  . Glaucoma  . HTN (hypertension)  . Hypercholesterolemia  . Kidney anomaly, congenital   "born with one kidney" . Shortness of breath dyspnea   due to overweight and decreased exercise Past Surgical History: Past Surgical History: Procedure Laterality Date . BLADDER SURGERY  2001  cystoscopic . [place] . COLONOSCOPY W/ POLYPECTOMY   . COLONOSCOPY WITH PROPOFOL N/Carol Theys 05/04/2015  Procedure: COLONOSCOPY WITH PROPOFOL;  Surgeon: Clarene Essex, MD;  Location[facility] ENDOSCOPY;  Service: Endoscopy;  Laterality: N/Solace Wendorff; . EYE SURGERY Bilateral   laser for Glaucoma . HERNIA REPAIR  1960 . INGUINAL HERNIA REPAIR Right 08/01/2012  Procedure: HERNIA REPAIR INGUINAL ADULT;  Surgeon: Earnstine Regal, MD;  Location: [facility] ORS;  Service: General;  Laterality:  Right; . INSERTION OF MESH Right 08/01/2012  Procedure: INSERTION OF MESH;  Surgeon: Earnstine Regal, MD;  Location: [facility] ORS;  Service: General;  Laterality: Right; . TONSILLECTOMY   . VIDEO ASSISTED THORACOSCOPY (VATS)/WEDGE RESECTION Right 02/09/2019  Procedure: VIDEO ASSISTED THORACOSCOPY (VATS)/WEDGE RESECTION;  Surgeon: Lajuana Matte, MD;  Location: [facility];  Service: Thoracic;  Laterality: Right; Marland Kitchen VIDEO BRONCHOSCOPY N/Alhaji Mcneal 02/09/2019  Procedure: VIDEO BRONCHOSCOPY;  Surgeon: Lajuana Matte, MD;  Location: [facility] OR;  Service: Thoracic;  Laterality: N/Malynn Lucy; HPI: Pt is Jordany Russett 78 y.o. male recently admitted s/p VATS with lobe wedge resection (02/09/2019), now admitted 02/25/2019 with worsening cough and SOB; tested (+) COVID-19. [name] oxygen needs were not improving and critical care requested MBS. Other PMH includes COPD/pulmonary fibrosis, cervical DDD, HTN, DM, HLD.  Subjective: pt denies any subjective difficulties swallowing Assessment / Plan / Recommendation CHL IP CLINICAL IMPRESSIONS 03/26/2019 Clinical Impression  Pt has Yonis Carreon moderate pharyngeal dysphagia that appears to be acute on chronic in nature given the appearance of large, bridging osteophytes that protrude his posterior pharyngeal wall and limit full range of epiglottic deflection. Throughout the study pt was on 15L via HFNC, needing intermittent use of NRB for desaturations (lowest SpO2 reading in the 70s after transferring, for which PT was present to assist). Thin liquids are seen entering the laryngeal vestibule as he swallows given incomplete LVC, and although visibility is limited at times given shadow from shoulder, there is defiinitive aspiration noted. Pt only coughed x1 when aspirating Kayln Garceau larger volume, and his cough did not clear his airway effectively. Nectar thick liquids are also penetrated but exit the laryngeal vestibule consistently. Pt has vallecular residue across consistencies, although this was not observed to enter the airway during  testing and was reduced as he subsequent swallows were performed. Kalle Bernath chin tuck did not improve swallowing safety or efficiency (in fact, it may have worsened it), but Asheton Viramontes head turn to the right helped him swallow thin liquids even via large, consecutive straw sips.  Given the silent nature of his aspriation and the primary structural component it is possible that the pt has been aspirating more chronically PTA, although he denies Jakye Mullens h/o dysphagia or PNA. In the setting of acute illness and respiratory decline, I suspect that he may be aspirating more (volume or frequency) and/or he may not be able to compensate  for or tolerate the aspiration that occurs. Extensive education was provided after the study back in his room, at which time I reviewed the above. We discussed options such as continuing thin liquids with Broghan Pannone head turn to the right, needing to try to be very consistent in the use of this strategy. He could also consider Martine Bleecker trial of nectar thick liquids (perhaps with water protocol) if he feels like he will not be able to use the head turn as much as he will need to. At this time, pt would like to try to stay on regular solids and thin liquids with use of strategies. SLP provided signage at his [facility] and in front of him where he can see it to help serve as Amarri Satterly reminder. Will need additional SLP f/u for safety and to maximize swallow function.  SLP Visit Diagnosis Dysphagia, pharyngeal phase (R13.13) Attention and concentration deficit following -- Frontal lobe and executive function deficit following -- Impact on safety and function Moderate aspiration risk   CHL IP TREATMENT RECOMMENDATION 03/26/2019 Treatment Recommendations Therapy as outlined in treatment plan below   Prognosis 03/26/2019 Prognosis for Safe Diet Advancement Fair Barriers to Reach Goals Other (Comment) Barriers/Prognosis Comment -- CHL IP DIET RECOMMENDATION 03/26/2019 SLP Diet Recommendations Regular solids;Thin liquid Liquid Administration via  Cup;Straw Medication Administration Whole meds with puree Compensations Slow rate;Small sips/bites;Other (Comment) Postural Changes Seated upright at 90 degrees;Remain semi-upright after after feeds/meals (Comment)   CHL IP OTHER RECOMMENDATIONS 03/26/2019 Recommended Consults -- Oral Care Recommendations Oral care BID Other Recommendations --   CHL IP FOLLOW UP RECOMMENDATIONS 03/26/2019 Follow up Recommendations Inpatient Rehab   CHL IP FREQUENCY AND DURATION 03/26/2019 Speech Therapy Frequency (ACUTE ONLY) min 2x/week Treatment Duration 2 weeks      CHL IP ORAL PHASE 03/26/2019 Oral Phase WFL Oral - Pudding Teaspoon -- Oral - Pudding Cup -- Oral - Honey Teaspoon -- Oral - Honey Cup -- Oral - Nectar Teaspoon -- Oral - Nectar Cup -- Oral - Nectar Straw -- Oral - Thin Teaspoon -- Oral - Thin Cup -- Oral - Thin Straw -- Oral - Puree -- Oral - Mech Soft -- Oral - Regular -- Oral - Multi-Consistency -- Oral - Pill -- Oral Phase - Comment --  CHL IP PHARYNGEAL PHASE 03/26/2019 Pharyngeal Phase Impaired Pharyngeal- Pudding Teaspoon -- Pharyngeal -- Pharyngeal- Pudding Cup -- Pharyngeal -- Pharyngeal- Honey Teaspoon -- Pharyngeal -- Pharyngeal- Honey Cup -- Pharyngeal -- Pharyngeal- Nectar Teaspoon -- Pharyngeal -- Pharyngeal- Nectar Cup Reduced epiglottic inversion;Penetration/Aspiration during swallow;Pharyngeal residue - valleculae Pharyngeal Material enters airway, remains ABOVE vocal cords then ejected out Pharyngeal- Nectar Straw Reduced epiglottic inversion;Penetration/Aspiration during swallow;Pharyngeal residue - valleculae Pharyngeal Material enters airway, remains ABOVE vocal cords then ejected out Pharyngeal- Thin Teaspoon -- Pharyngeal -- Pharyngeal- Thin Cup Reduced epiglottic inversion;Penetration/Aspiration during swallow;Pharyngeal residue - valleculae Pharyngeal Material enters airway, passes BELOW cords without attempt by patient to eject out (silent aspiration) Pharyngeal- Thin Straw Reduced epiglottic  inversion;Penetration/Aspiration during swallow;Pharyngeal residue - valleculae Pharyngeal Material enters airway, passes BELOW cords without attempt by patient to eject out (silent aspiration) Pharyngeal- Puree Reduced epiglottic inversion;Pharyngeal residue - valleculae Pharyngeal -- Pharyngeal- Mechanical Soft Reduced epiglottic inversion;Pharyngeal residue - valleculae Pharyngeal -- Pharyngeal- Regular -- Pharyngeal -- Pharyngeal- Multi-consistency -- Pharyngeal -- Pharyngeal- Pill -- Pharyngeal -- Pharyngeal Comment --  CHL IP CERVICAL ESOPHAGEAL PHASE 03/26/2019 Cervical Esophageal Phase (No Data) Pudding Teaspoon -- Pudding Cup -- Honey Teaspoon -- Honey Cup -- Nectar Teaspoon -- Nectar Cup -- [employer]  Straw -- Thin Teaspoon -- Thin Cup -- Thin Straw -- Puree -- Mechanical Soft -- Regular -- Multi-consistency -- Pill -- Cervical Esophageal Comment -- 03/26/2019, 9:54 AM  Osie Bond., M.Nicholi Ghuman. CCC-SLP Acute Rehabilitation Services Pager [phone] Office [phone]                  Scheduled Meds: . aspirin EC  81 mg Oral Daily  . enoxaparin (LOVENOX) injection  60 mg Subcutaneous Q24H  . feeding supplement (ENSURE ENLIVE)  237 mL Oral BID BM  . fluticasone  2 spray Each Nare Daily  . furosemide  60 mg Intravenous BID  . guaiFENesin  1,200 mg Oral BID  . insulin aspart  0-20 Units Subcutaneous TID WC  . insulin aspart  8 Units Subcutaneous TID WC  . latanoprost  1 drop Both Eyes QHS  . loratadine  10 mg Oral Daily  . mouth rinse  15 mL Mouth Rinse BID  . multivitamin with minerals  1 tablet Oral Daily  . omega-3 acid ethyl esters  1 g Oral Daily  . pantoprazole  40 mg Oral Daily  . potassium chloride  40 mEq Oral Q4H  . predniSONE  40 mg Oral Q breakfast   Followed by  . [START ON 04/02/2019] predniSONE  20 mg Oral Q breakfast   Followed by  . [START ON 04/09/2019] predniSONE  10 mg Oral Q breakfast  . QUEtiapine  50 mg Oral QHS  . rosuvastatin  10 mg Oral Daily  . timolol  1 drop Left  Eye BID  . umeclidinium-vilanterol  1 puff Inhalation Daily   Continuous Infusions:   LOS: 27 days    Time spent: over 30 min    Fayrene Helper, MD [facility] Pager AMION  If 7PM-7AM, please contact night-coverage [email or web address] Password [email or web address] 03/26/2019, 2:16 PM

## 2019-03-26 NOTE — Progress Notes (Signed)
Physical Therapy Treatment Patient Details Name: Justin Atkinson. MRN: [id number] DOB: 10/08/1940 Today's Date: 03/26/2019    History of Present Illness Pt is a 78 y.o. male recently admitted s/p VATS with lobe wedge resection (02/09/2019), now admitted 02/02/2019 with worsening cough and SOB; tested (+) COVID-19. Other PMH includes COPD/pulmonary fibrosis, cervical DDD, HTN, DM, HLD.    PT Comments    The patient required increased assistance to stand from recliner this visit. Patient reports that he has been up since about 5:30 AM . Patient' at rest on 15 L HFNC SPO2 93%. With any efforts and movement, transfer to MBS chair, SPO2 77%, RR high 30' and patient feels need to use NRB in addition to get SPO2 back to 91% over ~ 4 minutes. Patient left with SP and radiology for MBS. Will assist back to recliner when finished.sign   Follow Up Recommendations  CIR;Supervision for mobility/OOB     Equipment Recommendations  Rolling walker with 5" wheels;Other (comment)    Recommendations for Other Services Rehab consult     Precautions / Restrictions Precautions Precautions: Fall;Other (comment) Precaution Comments: 12L HFNC + 15L NRB, desats quickly down into 70s.     Mobility  Bed Mobility               General bed mobility comments: Pt was OOB in the recliner chair.    Transfers Overall transfer level: Needs assistance Equipment used: 1 person hand held assist Transfers: Sit to/from [facility] Sit to Stand: Mod assist Stand pivot transfers: Mod assist       General transfer comment: mod assist to rise from recliner,  Assist to power up. HHA pivot to MBS chair from low recliner,  small shuffle steps, reaching for the chair. SPO2 77% on HFNC 15L  Ambulation/Gait                 Stairs             Wheelchair Mobility    Modified Rankin (Stroke Patients Only)       Balance     Sitting balance-Leahy Scale: Fair     Standing  balance support: Bilateral upper extremity supported Standing balance-Leahy Scale: Poor Standing balance comment: needs external support in standing                            Cognition Arousal/Alertness: Awake/alert                                            Exercises      General Comments        Pertinent Vitals/Pain Faces Pain Scale: Hurts a little bit Pain Location: generalized Pain Descriptors / Indicators: Discomfort Pain Intervention(s): Monitored during session    Home Living                      Prior Function            PT Goals (current goals can now be found in the care plan section) Progress towards PT goals: Progressing toward goals    Frequency    Min 3X/week      PT Plan Current plan remains appropriate    Co-evaluation              AM-PAC PT "6 Clicks" Mobility  Outcome Measure  Help needed turning from your back to your side while in a flat bed without using bedrails?: A Little Help needed moving from lying on your back to sitting on the side of a flat bed without using bedrails?: A Little Help needed moving to and from a bed to a chair (including a wheelchair)?: A Lot Help needed standing up from a chair using your arms (e.g., wheelchair or bedside chair)?: A Lot Help needed to walk in hospital room?: A Lot Help needed climbing 3-5 steps with a railing? : Total 6 Click Score: 13    End of Session Equipment Utilized During Treatment: Oxygen(used NRB and HFNC) Activity Tolerance: Patient limited by fatigue Patient left: in chair with SP to perform MBS.  Nurse Communication: Mobility status PT Visit Diagnosis: Muscle weakness (generalized) (M62.81);Difficulty in walking, not elsewhere classified (R26.2)     Time: [id number] PT Time Calculation (min) (ACUTE ONLY): 30 min  Charges:  $Therapeutic Activity: 23-47mins                     Tresa Endo PT Acute Rehabilitation Services Pager  [phone] Office [phone]    Claretha Cooper 03/26/2019, 4:08 PM

## 2019-03-27 LAB — CBC WITH DIFFERENTIAL/PLATELET
Abs Immature Granulocytes: 0.04 10*3/uL (ref 0.00–0.07)
Basophils Absolute: 0 10*3/uL (ref 0.0–0.1)
Basophils Relative: 0 %
Eosinophils Absolute: 0.4 10*3/uL (ref 0.0–0.5)
Eosinophils Relative: 4 %
HCT: 48.5 % (ref 39.0–52.0)
Hemoglobin: 15.3 g/dL (ref 13.0–17.0)
Immature Granulocytes: 1 %
Lymphocytes Relative: 8 %
Lymphs Abs: 0.7 10*3/uL (ref 0.7–4.0)
MCH: 30.1 pg (ref 26.0–34.0)
MCHC: 31.5 g/dL (ref 30.0–36.0)
MCV: 95.3 fL (ref 80.0–100.0)
Monocytes Absolute: 0.7 10*3/uL (ref 0.1–1.0)
Monocytes Relative: 8 %
Neutro Abs: 6.7 10*3/uL (ref 1.7–7.7)
Neutrophils Relative %: 79 %
Platelets: 191 10*3/uL (ref 150–400)
RBC: 5.09 MIL/uL (ref 4.22–5.81)
RDW: 14 % (ref 11.5–15.5)
WBC: 8.5 10*3/uL (ref 4.0–10.5)
nRBC: 0 % (ref 0.0–0.2)

## 2019-03-27 LAB — D-DIMER, QUANTITATIVE: D-Dimer, Quant: 0.87 ug/mL-FEU — ABNORMAL HIGH (ref 0.00–0.50)

## 2019-03-27 LAB — COMPREHENSIVE METABOLIC PANEL
ALT: 62 U/L — ABNORMAL HIGH (ref 0–44)
AST: 19 U/L (ref 15–41)
Albumin: 3.3 g/dL — ABNORMAL LOW (ref 3.5–5.0)
Alkaline Phosphatase: 63 U/L (ref 38–126)
Anion gap: 11 (ref 5–15)
BUN: 49 mg/dL — ABNORMAL HIGH (ref 8–23)
CO2: 36 mmol/L — ABNORMAL HIGH (ref 22–32)
Calcium: 9 mg/dL (ref 8.9–10.3)
Chloride: 91 mmol/L — ABNORMAL LOW (ref 98–111)
Creatinine, Ser: 1.12 mg/dL (ref 0.61–1.24)
GFR calc Af Amer: >60 mL/min (ref >60)
GFR calc non Af Amer: >60 mL/min (ref >60)
Glucose, Bld: 66 mg/dL — ABNORMAL LOW (ref 70–99)
Potassium: 3.6 mmol/L (ref 3.5–5.1)
Sodium: 138 mmol/L (ref 135–145)
Total Bilirubin: 1.1 mg/dL (ref 0.3–1.2)
Total Protein: 6.3 g/dL — ABNORMAL LOW (ref 6.5–8.1)

## 2019-03-27 LAB — CORTISOL: Cortisol, Plasma: 21.7 ug/dL

## 2019-03-27 LAB — MAGNESIUM: Magnesium: 2.7 mg/dL — ABNORMAL HIGH (ref 1.7–2.4)

## 2019-03-27 LAB — PHOSPHORUS: Phosphorus: 3.7 mg/dL (ref 2.5–4.6)

## 2019-03-27 LAB — GLUCOSE, CAPILLARY
Glucose-Capillary: 91 mg/dL (ref 70–99)
Glucose-Capillary: 201 mg/dL — ABNORMAL HIGH (ref 70–99)
Glucose-Capillary: 283 mg/dL — ABNORMAL HIGH (ref 70–99)

## 2019-03-27 LAB — FERRITIN: Ferritin: 569 ng/mL — ABNORMAL HIGH (ref 24–336)

## 2019-03-27 LAB — C-REACTIVE PROTEIN: CRP: 3.1 mg/dL — ABNORMAL HIGH (ref <1.0)

## 2019-03-27 MED ORDER — INSULIN ASPART 100 UNIT/ML ~~LOC~~ SOLN
0.0000 [IU] | Freq: Three times a day (TID) | SUBCUTANEOUS | Status: DC
  Administered 2019-03-27: 5 [IU] via SUBCUTANEOUS
  Administered 2019-03-28: 3 [IU] via SUBCUTANEOUS
  Administered 2019-03-28: 2 [IU] via SUBCUTANEOUS
  Administered 2019-03-29: 3 [IU] via SUBCUTANEOUS
  Administered 2019-03-29: 2 [IU] via SUBCUTANEOUS
  Administered 2019-03-30: 3 [IU] via SUBCUTANEOUS
  Administered 2019-03-30 – 2019-03-31 (×3): 2 [IU] via SUBCUTANEOUS
  Administered 2019-04-01 (×2): 1 [IU] via SUBCUTANEOUS
  Administered 2019-04-01: 2 [IU] via SUBCUTANEOUS
  Administered 2019-04-02: 1 [IU] via SUBCUTANEOUS
  Administered 2019-04-03: 2 [IU] via SUBCUTANEOUS
  Administered 2019-04-03 – 2019-04-04 (×2): 1 [IU] via SUBCUTANEOUS
  Administered 2019-04-04: 2 [IU] via SUBCUTANEOUS
  Administered 2019-04-04 – 2019-04-05 (×3): 1 [IU] via SUBCUTANEOUS
  Administered 2019-04-06: 2 [IU] via SUBCUTANEOUS
  Administered 2019-04-06: 1 [IU] via SUBCUTANEOUS

## 2019-03-27 MED ORDER — INSULIN ASPART 100 UNIT/ML ~~LOC~~ SOLN
6.0000 [IU] | Freq: Three times a day (TID) | SUBCUTANEOUS | Status: DC
  Administered 2019-03-27 – 2019-04-03 (×17): 6 [IU] via SUBCUTANEOUS

## 2019-03-27 NOTE — Progress Notes (Addendum)
PROGRESS NOTE    Justin Atkinson.  [id number] DOB: 1940-06-21 DOA: 02/08/2019 PCP: [name], MD  Brief Narrative:  78 year old male with COPD/pulmonary fibrosis, recently diagnosed hypersensitivity pneumonitis following VATS on 11/9, (follows with [name] as an outpatient, recently started on steroids in November), hypertension, type 2 diabetes, hyperlipidemia, was admitted to the hospital on 02/26/2019 with increased shortness of breath and profound hypoxia.  He was diagnosed with COVID-19 initially admitted to the ICU for heated high flow.  Assessment & Plan:   Principal Problem:   Acute respiratory failure with hypoxia (HCC) Active Problems:   Hypertension   Hypercholesterolemia   Benign prostate hyperplasia   ILD (interstitial lung disease) (HCC)   Postinflammatory pulmonary fibrosis (HCC)   HCAP (healthcare-associated pneumonia)   Chronic obstructive pulmonary disease (HCC)   Pneumonia due to COVID-19 virus   Severe sepsis (HCC)   Essential hypertension   Diabetes mellitus type 2, controlled, with complications ([place])   Pulmonary fibrosis (HCC)   Hypersensitivity pneumonitis (HCC)  Acute Hypoxic Respiratory Failure due to Covid-19 Viral Illness  Pneumomediastinum -Patient was admitted to the hospital with hypoxic respiratory failure due to COVID-19 pneumonia.  He finished Tylesha Gibeault course of 5 days of remdesivir, received convalescent plasma on 11/30 as well as Actemra on 11/29. -He was on heated high flow from admission until ~12/7 - 12/8 per my review of chart and previous documentation of vitals.  Charted on 8 L HFNC after my rounds.  Some gradual improvement.  Follow. -continue steroids, prednisone taper per pulmonology -Maintain negative fluid balance (currently net negative 9.8 L), continue Lasix (currently ordered daily - he has LE edema) -OOB, IS, prone as able -CT chest with small pneumomediastinum, supportive care - follow intermittent CXR  COVID-19  Labs  Recent Labs    03/25/19 0400 03/26/19 0255 03/27/19 0550  DDIMER 0.80* 0.78* 0.87*  FERRITIN 525* 540* 569*  CRP 0.7 1.7* 3.1*    Lab Results  Component Value Date   SARSCOV2NAA POSITIVE (Jenifer Struve) 02/17/2019   SARSCOV2NAA NOT DETECTED 02/05/2019   Garibaldi NOT DETECTED 01/09/2019   Pulmonary fibrosis/hypersensitivity pneumonitis -Follows with pulmonology as an outpatient, currently maintained on steroids will likely need Tayshaun Kroh very prolonged steroid taper - Pulm has been reconsulted due to his history of hypersensitivity pneumonitis and prolonged course - appreciate recommendations -> CT chest as above, wean steroids per pulm (was on 20 mg daily outpatient prior to admission) - MBS with silent aspiration (per pulm, can be similar to HSP on path)  Acute on chronic diastolic CHF -No 2D echo in the system as well he had Atlee Kluth stress test in November 2020 with an EF of 52% -echo 12/21 with normal EF, indeterminate LV diastolic parameters (see report) - IVC is normal with > 50% resp var -LE edema persists, some mild improvement today -Continue lasix, increase dose to 60 mg BID  Lower extremity swelling -Patient's appreciates his left leg is more swollen than the right, will obtain venous Doppler (negative) -Continue Lasix as above  HCAP/severe sepsis  -Resolved, completed Kacie Huxtable course of cefepime.  Monitor off antibiotics  Essential hypertension -His blood pressure medications were on hold, now placed on Lasix, monitor  Type 2 diabetes mellitus  Hypoglycemia - AM hypoglycemia - follow AM cortisol.  Low dose SSI, decrease mealtime and follow.  Thrombocytopenia: improved  Elevated Creatinine: follow closely with diuresis, may have to hold lasix in Keilynn Marano day or so  Hypokalemia: replace and follow  DVT prophylaxis: lovenox Code Status: DNR Family  Communication: none at bedside - wife 12/25 Disposition Plan: pending further improvement  Consultants:   none  Procedures:    none  Antimicrobials:  Anti-infectives (From admission, onward)   Start     Dose/Rate Route Frequency Ordered Stop   03/01/19 0800  remdesivir 100 mg in sodium chloride 0.9 % 250 mL IVPB     100 mg 500 mL/hr over 30 Minutes Intravenous Every 24 hours 02/28/19 0823 03/04/19 1216   02/28/19 1000  remdesivir 200 mg in sodium chloride 0.9 % 250 mL IVPB     200 mg 500 mL/hr over 30 Minutes Intravenous Once 02/28/19 0823 02/28/19 1006   02/28/19 0000  vancomycin (VANCOCIN) 1,250 mg in sodium chloride 0.9 % 250 mL IVPB  Status:  Discontinued     1,250 mg 166.7 mL/hr over 90 Minutes Intravenous Every 12 hours 03/01/2019 1639 02/14/2019 2037   02/18/2019 2200  ceFEPIme (MAXIPIME) 2 g in sodium chloride 0.9 % 100 mL IVPB  Status:  Discontinued     2 g 200 mL/hr over 30 Minutes Intravenous Every 8 hours 02/13/2019 1629 03/03/19 1242   02/23/2019 1315  ceFEPIme (MAXIPIME) 2 g in sodium chloride 0.9 % 100 mL IVPB     2 g 200 mL/hr over 30 Minutes Intravenous  Once 02/01/2019 1306 02/28/19 0324   02/10/2019 1315  vancomycin (VANCOCIN) 2,000 mg in sodium chloride 0.9 % 500 mL IVPB     2,000 mg 250 mL/hr over 120 Minutes Intravenous  Once 02/12/2019 1311 02/28/19 0324       Subjective: No new complaints.  Objective: Vitals:   03/27/19 0513 03/27/19 0800 03/27/19 0908 03/27/19 1200  BP:  132/67  104/72  Pulse: 62 72    Resp: 17  18   Temp:  97.7 F (36.5 C)    TempSrc:  Axillary    SpO2: 95%     Weight:      Height:        Intake/Output Summary (Last 24 hours) at 03/27/2019 1351 Last data filed at 03/27/2019 0545 Gross per 24 hour  Intake 900 ml  Output 1450 ml  Net -550 ml   Filed Weights   02/28/19 0812 03/06/19 1607 03/23/19 0900  Weight: 130.7 kg 124.2 kg 118.2 kg    Examination:  General: No acute distress. Cardiovascular: RRR Lungs: Clear to auscultation bilaterally Abdomen: Soft, nontender, nondistended Neurological: Alert and oriented 3. Moves all extremities 4. Cranial  nerves II through XII grossly intact. Skin: Warm and dry. No rashes or lesions. Extremities: LE edema, improving    Data Reviewed: I have personally reviewed following labs and imaging studies  CBC: Recent Labs  Lab 03/23/19 0440 03/24/19 0250 03/25/19 0400 03/26/19 0255 03/27/19 0550  WBC 9.7 8.1 9.3 8.8 8.5  NEUTROABS 8.6* 7.4 7.7 7.1 6.7  HGB 16.4 15.2 15.9 14.7 15.3  HCT 51.3 46.2 48.9 45.7 48.5  MCV 95.5 94.1 94.6 95.0 95.3  PLT 159 135* 171 162 [id number]   Basic Metabolic Panel: Recent Labs  Lab 03/21/19 0101 03/23/19 0440 03/24/19 0250 03/25/19 0400 03/26/19 0255 03/27/19 0550  NA 135 138 135 137 138 138  K 4.1 3.8 4.1 4.2 3.2* 3.6  CL 92* 89* 90* 92* 91* 91*  CO2 29 34* 32 35* 34* 36*  GLUCOSE 172* 71 110* 59* 45* 66*  BUN 46* 50* 56* 56* 53* 49*  CREATININE 0.97 1.02 1.01 1.03 1.13 1.12  CALCIUM 9.2 9.4 9.0 9.3 8.9 9.0  MG 2.5* 2.6* 2.6* 2.6* 2.6*  2.7*  PHOS 3.9  --  4.4 4.3 4.2 3.7   GFR: Estimated Creatinine Clearance: 75.6 mL/min (by C-G formula based on SCr of 1.12 mg/dL). Liver Function Tests: Recent Labs  Lab 03/23/19 0440 03/24/19 0250 03/25/19 0400 03/26/19 0255 03/27/19 0550  AST 25 26 27 18 19   ALT 65* 79* 85* 70* 62*  ALKPHOS 62 54 57 57 63  BILITOT 1.1 1.0 1.3* 1.3* 1.1  PROT 7.0 6.0* 6.2* 5.9* 6.3*  ALBUMIN 3.8 3.3* 3.5 3.3* 3.3*   No results for input(s): LIPASE, AMYLASE in the last 168 hours. No results for input(s): AMMONIA in the last 168 hours. Coagulation Profile: No results for input(s): INR, PROTIME in the last 168 hours. Cardiac Enzymes: No results for input(s): CKTOTAL, CKMB, CKMBINDEX, TROPONINI in the last 168 hours. BNP (last 3 results) No results for input(s): PROBNP in the last 8760 hours. HbA1C: No results for input(s): HGBA1C in the last 72 hours. CBG: Recent Labs  Lab 03/26/19 1145 03/26/19 1637 03/26/19 2040 03/27/19 0823 03/27/19 1232  GLUCAP 158* 211* 163* 91 201*   Lipid Profile: No results for  input(s): CHOL, HDL, LDLCALC, TRIG, CHOLHDL, LDLDIRECT in the last 72 hours. Thyroid Function Tests: No results for input(s): TSH, T4TOTAL, FREET4, T3FREE, THYROIDAB in the last 72 hours. Anemia Panel: Recent Labs    03/26/19 0255 03/27/19 0550  FERRITIN 540* 569*   Sepsis Labs: No results for input(s): PROCALCITON, LATICACIDVEN in the last 168 hours.  No results found for this or any previous visit (from the past 240 hour(s)).       Radiology Studies: DG Swallowing Func-Speech Pathology  Result Date: 03/26/2019 Objective Swallowing Evaluation: Type of Study: MBS-Modified Barium Swallow Study  Patient Details Name: Jerett Talavera. MRN: [id number] Date of Birth: [birth year] Today's Date: 03/26/2019 Time: SLP Start Time (ACUTE ONLY): 0800 -SLP Stop Time (ACUTE ONLY): 0902 SLP Time Calculation (min) (ACUTE ONLY): 62 min Past Medical History: Past Medical History: Diagnosis Date . Bladder cancer [facility]) 2001  Surgery . Complication of anesthesia 2001  woke up during colonoscopy . COPD (chronic obstructive pulmonary disease) ([place])   "beginning Emphesma" . DDD (degenerative disc disease), cervical  . Diabetes mellitus without complication ([place])  . DJD (degenerative joint disease)  . Glaucoma  . HTN (hypertension)  . Hypercholesterolemia  . Kidney anomaly, congenital   "born with one kidney" . Shortness of breath dyspnea   due to overweight and decreased exercise Past Surgical History: Past Surgical History: Procedure Laterality Date . BLADDER SURGERY  2001  cystoscopic . [place] . COLONOSCOPY W/ POLYPECTOMY   . COLONOSCOPY WITH PROPOFOL N/Vianne Grieshop 05/04/2015  Procedure: COLONOSCOPY WITH PROPOFOL;  Surgeon: Clarene Essex, MD;  Location: [facility] ENDOSCOPY;  Service: Endoscopy;  Laterality: N/Alexanderjames Berg; . EYE SURGERY Bilateral   laser for Glaucoma . HERNIA REPAIR  1960 . INGUINAL HERNIA REPAIR Right 08/01/2012  Procedure: HERNIA REPAIR INGUINAL ADULT;  Surgeon: Earnstine Regal, MD;  Location: [facility] ORS;  Service:  General;  Laterality: Right; . INSERTION OF MESH Right 08/01/2012  Procedure: INSERTION OF MESH;  Surgeon: Earnstine Regal, MD;  Location: [facility] ORS;  Service: General;  Laterality: Right; . TONSILLECTOMY   . VIDEO ASSISTED THORACOSCOPY (VATS)/WEDGE RESECTION Right 02/09/2019  Procedure: VIDEO ASSISTED THORACOSCOPY (VATS)/WEDGE RESECTION;  Surgeon: Lajuana Matte, MD;  Location: [place];  Service: Thoracic;  Laterality: Right; Marland Kitchen VIDEO BRONCHOSCOPY N/Eames Dibiasio 02/09/2019  Procedure: VIDEO BRONCHOSCOPY;  Surgeon: Lajuana Matte, MD;  Location: [place];  Service:  Thoracic;  Laterality: N/Lyrica Mcclarty; HPI: Pt is Cabrini Ruggieri 78 y.o. male recently admitted s/p VATS with lobe wedge resection (02/09/2019), now admitted 02/06/2019 with worsening cough and SOB; tested (+) COVID-19. [name] oxygen needs were not improving and critical care requested MBS. Other PMH includes COPD/pulmonary fibrosis, cervical DDD, HTN, DM, HLD.  Subjective: pt denies any subjective difficulties swallowing Assessment / Plan / Recommendation CHL IP CLINICAL IMPRESSIONS 03/26/2019 Clinical Impression  Pt has Deston Bilyeu moderate pharyngeal dysphagia that appears to be acute on chronic in nature given the appearance of large, bridging osteophytes that protrude his posterior pharyngeal wall and limit full range of epiglottic deflection. Throughout the study pt was on 15L via HFNC, needing intermittent use of NRB for desaturations (lowest SpO2 reading in the 70s after transferring, for which PT was present to assist). Thin liquids are seen entering the laryngeal vestibule as he swallows given incomplete LVC, and although visibility is limited at times given shadow from shoulder, there is defiinitive aspiration noted. Pt only coughed x1 when aspirating Tonesha Tsou larger volume, and his cough did not clear his airway effectively. Nectar thick liquids are also penetrated but exit the laryngeal vestibule consistently. Pt has vallecular residue across consistencies, although this was not observed to enter  the airway during testing and was reduced as he subsequent swallows were performed. Haylea Schlichting chin tuck did not improve swallowing safety or efficiency (in fact, it may have worsened it), but Jimmi Sidener head turn to the right helped him swallow thin liquids even via large, consecutive straw sips.  Given the silent nature of his aspriation and the primary structural component it is possible that the pt has been aspirating more chronically PTA, although he denies Damin Salido h/o dysphagia or PNA. In the setting of acute illness and respiratory decline, I suspect that he may be aspirating more (volume or frequency) and/or he may not be able to compensate for or tolerate the aspiration that occurs. Extensive education was provided after the study back in his room, at which time I reviewed the above. We discussed options such as continuing thin liquids with Joyceann Kruser head turn to the right, needing to try to be very consistent in the use of this strategy. He could also consider Stanley Lyness trial of nectar thick liquids (perhaps with water protocol) if he feels like he will not be able to use the head turn as much as he will need to. At this time, pt would like to try to stay on regular solids and thin liquids with use of strategies. SLP provided signage at his [facility] and in front of him where he can see it to help serve as Param Capri reminder. Will need additional SLP f/u for safety and to maximize swallow function.  SLP Visit Diagnosis Dysphagia, pharyngeal phase (R13.13) Attention and concentration deficit following -- Frontal lobe and executive function deficit following -- Impact on safety and function Moderate aspiration risk   CHL IP TREATMENT RECOMMENDATION 03/26/2019 Treatment Recommendations Therapy as outlined in treatment plan below   Prognosis 03/26/2019 Prognosis for Safe Diet Advancement Fair Barriers to Reach Goals Other (Comment) Barriers/Prognosis Comment -- CHL IP DIET RECOMMENDATION 03/26/2019 SLP Diet Recommendations Regular solids;Thin liquid Liquid  Administration via Cup;Straw Medication Administration Whole meds with puree Compensations Slow rate;Small sips/bites;Other (Comment) Postural Changes Seated upright at 90 degrees;Remain semi-upright after after feeds/meals (Comment)   CHL IP OTHER RECOMMENDATIONS 03/26/2019 Recommended Consults -- Oral Care Recommendations Oral care BID Other Recommendations --   CHL IP FOLLOW UP RECOMMENDATIONS 03/26/2019 Follow up Recommendations Inpatient Rehab  CHL IP FREQUENCY AND DURATION 03/26/2019 Speech Therapy Frequency (ACUTE ONLY) min 2x/week Treatment Duration 2 weeks      CHL IP ORAL PHASE 03/26/2019 Oral Phase WFL Oral - Pudding Teaspoon -- Oral - Pudding Cup -- Oral - Honey Teaspoon -- Oral - Honey Cup -- Oral - Nectar Teaspoon -- Oral - Nectar Cup -- Oral - Nectar Straw -- Oral - Thin Teaspoon -- Oral - Thin Cup -- Oral - Thin Straw -- Oral - Puree -- Oral - Mech Soft -- Oral - Regular -- Oral - Multi-Consistency -- Oral - Pill -- Oral Phase - Comment --  CHL IP PHARYNGEAL PHASE 03/26/2019 Pharyngeal Phase Impaired Pharyngeal- Pudding Teaspoon -- Pharyngeal -- Pharyngeal- Pudding Cup -- Pharyngeal -- Pharyngeal- Honey Teaspoon -- Pharyngeal -- Pharyngeal- Honey Cup -- Pharyngeal -- Pharyngeal- Nectar Teaspoon -- Pharyngeal -- Pharyngeal- Nectar Cup Reduced epiglottic inversion;Penetration/Aspiration during swallow;Pharyngeal residue - valleculae Pharyngeal Material enters airway, remains ABOVE vocal cords then ejected out Pharyngeal- Nectar Straw Reduced epiglottic inversion;Penetration/Aspiration during swallow;Pharyngeal residue - valleculae Pharyngeal Material enters airway, remains ABOVE vocal cords then ejected out Pharyngeal- Thin Teaspoon -- Pharyngeal -- Pharyngeal- Thin Cup Reduced epiglottic inversion;Penetration/Aspiration during swallow;Pharyngeal residue - valleculae Pharyngeal Material enters airway, passes BELOW cords without attempt by patient to eject out (silent aspiration) Pharyngeal- Thin Straw  Reduced epiglottic inversion;Penetration/Aspiration during swallow;Pharyngeal residue - valleculae Pharyngeal Material enters airway, passes BELOW cords without attempt by patient to eject out (silent aspiration) Pharyngeal- Puree Reduced epiglottic inversion;Pharyngeal residue - valleculae Pharyngeal -- Pharyngeal- Mechanical Soft Reduced epiglottic inversion;Pharyngeal residue - valleculae Pharyngeal -- Pharyngeal- Regular -- Pharyngeal -- Pharyngeal- Multi-consistency -- Pharyngeal -- Pharyngeal- Pill -- Pharyngeal -- Pharyngeal Comment --  CHL IP CERVICAL ESOPHAGEAL PHASE 03/26/2019 Cervical Esophageal Phase (No Data) Pudding Teaspoon -- Pudding Cup -- Honey Teaspoon -- Honey Cup -- Nectar Teaspoon -- Nectar Cup -- Nectar Straw -- Thin Teaspoon -- Thin Cup -- Thin Straw -- Puree -- Mechanical Soft -- Regular -- Multi-consistency -- Pill -- Cervical Esophageal Comment -- 03/26/2019, 9:54 AM  Osie Bond., M.Daysi Boggan. CCC-SLP Acute Rehabilitation Services Pager [phone] Office [phone]                  Scheduled Meds: . aspirin EC  81 mg Oral Daily  . enoxaparin (LOVENOX) injection  60 mg Subcutaneous Q24H  . feeding supplement (ENSURE ENLIVE)  237 mL Oral BID BM  . fluticasone  2 spray Each Nare Daily  . furosemide  60 mg Intravenous BID  . guaiFENesin  1,200 mg Oral BID  . insulin aspart  0-20 Units Subcutaneous TID WC  . insulin aspart  8 Units Subcutaneous TID WC  . latanoprost  1 drop Both Eyes QHS  . loratadine  10 mg Oral Daily  . mouth rinse  15 mL Mouth Rinse BID  . multivitamin with minerals  1 tablet Oral Daily  . omega-3 acid ethyl esters  1 g Oral Daily  . pantoprazole  40 mg Oral Daily  . predniSONE  40 mg Oral Q breakfast   Followed by  . [START ON 04/02/2019] predniSONE  20 mg Oral Q breakfast   Followed by  . [START ON 04/09/2019] predniSONE  10 mg Oral Q breakfast  . QUEtiapine  50 mg Oral QHS  . rosuvastatin  10 mg Oral Daily  . timolol  1 drop Left Eye BID  .  umeclidinium-vilanterol  1 puff Inhalation Daily   Continuous Infusions:   LOS: 28 days    Time spent:  over 30 min    Fayrene Helper, MD [facility] Pager AMION  If 7PM-7AM, please contact night-coverage [email or web address] Password [email or web address] 03/27/2019, 1:51 PM

## 2019-03-28 LAB — COMPREHENSIVE METABOLIC PANEL
ALT: 64 U/L — ABNORMAL HIGH (ref 0–44)
AST: 21 U/L (ref 15–41)
Albumin: 3.1 g/dL — ABNORMAL LOW (ref 3.5–5.0)
Alkaline Phosphatase: 62 U/L (ref 38–126)
Anion gap: 13 (ref 5–15)
BUN: 46 mg/dL — ABNORMAL HIGH (ref 8–23)
CO2: 33 mmol/L — ABNORMAL HIGH (ref 22–32)
Calcium: 8.9 mg/dL (ref 8.9–10.3)
Chloride: 92 mmol/L — ABNORMAL LOW (ref 98–111)
Creatinine, Ser: 1.08 mg/dL (ref 0.61–1.24)
GFR calc Af Amer: >60 mL/min (ref >60)
GFR calc non Af Amer: >60 mL/min (ref >60)
Glucose, Bld: 85 mg/dL (ref 70–99)
Potassium: 3.6 mmol/L (ref 3.5–5.1)
Sodium: 138 mmol/L (ref 135–145)
Total Bilirubin: 0.9 mg/dL (ref 0.3–1.2)
Total Protein: 6 g/dL — ABNORMAL LOW (ref 6.5–8.1)

## 2019-03-28 LAB — CBC WITH DIFFERENTIAL/PLATELET
Abs Immature Granulocytes: 0.05 10*3/uL (ref 0.00–0.07)
Basophils Absolute: 0 10*3/uL (ref 0.0–0.1)
Basophils Relative: 0 %
Eosinophils Absolute: 0.5 10*3/uL (ref 0.0–0.5)
Eosinophils Relative: 6 %
HCT: 46 % (ref 39.0–52.0)
Hemoglobin: 14.9 g/dL (ref 13.0–17.0)
Immature Granulocytes: 1 %
Lymphocytes Relative: 9 %
Lymphs Abs: 0.8 10*3/uL (ref 0.7–4.0)
MCH: 30.8 pg (ref 26.0–34.0)
MCHC: 32.4 g/dL (ref 30.0–36.0)
MCV: 95.2 fL (ref 80.0–100.0)
Monocytes Absolute: 0.7 10*3/uL (ref 0.1–1.0)
Monocytes Relative: 8 %
Neutro Abs: 6.7 10*3/uL (ref 1.7–7.7)
Neutrophils Relative %: 76 %
Platelets: 181 10*3/uL (ref 150–400)
RBC: 4.83 MIL/uL (ref 4.22–5.81)
RDW: 13.9 % (ref 11.5–15.5)
WBC: 8.9 10*3/uL (ref 4.0–10.5)
nRBC: 0 % (ref 0.0–0.2)

## 2019-03-28 LAB — GLUCOSE, CAPILLARY
Glucose-Capillary: 99 mg/dL (ref 70–99)
Glucose-Capillary: 195 mg/dL — ABNORMAL HIGH (ref 70–99)
Glucose-Capillary: 180 mg/dL — ABNORMAL HIGH (ref 70–99)
Glucose-Capillary: 187 mg/dL — ABNORMAL HIGH (ref 70–99)

## 2019-03-28 LAB — FERRITIN: Ferritin: 608 ng/mL — ABNORMAL HIGH (ref 24–336)

## 2019-03-28 LAB — D-DIMER, QUANTITATIVE: D-Dimer, Quant: 0.59 ug/mL-FEU — ABNORMAL HIGH (ref 0.00–0.50)

## 2019-03-28 LAB — MAGNESIUM: Magnesium: 2.5 mg/dL — ABNORMAL HIGH (ref 1.7–2.4)

## 2019-03-28 LAB — C-REACTIVE PROTEIN: CRP: 2 mg/dL — ABNORMAL HIGH (ref <1.0)

## 2019-03-28 LAB — PHOSPHORUS: Phosphorus: 3.6 mg/dL (ref 2.5–4.6)

## 2019-03-28 MED ORDER — POTASSIUM CHLORIDE CRYS ER 20 MEQ PO TBCR
40.0000 meq | EXTENDED_RELEASE_TABLET | ORAL | Status: AC
  Administered 2019-03-28 (×2): 40 meq via ORAL
  Filled 2019-03-28 (×2): qty 2

## 2019-03-28 NOTE — Progress Notes (Addendum)
PROGRESS NOTE    Justin Atkinson.  [id number] DOB: 1941-01-05 DOA: 02/26/2019 PCP: [name], MD  Brief Narrative:  78 year old male with COPD/pulmonary fibrosis, recently diagnosed hypersensitivity pneumonitis following VATS on 11/9, (follows with [name] as an outpatient, recently started on steroids in November), hypertension, type 2 diabetes, hyperlipidemia, was admitted to the hospital on 02/20/2019 with increased shortness of breath and profound hypoxia.  He was diagnosed with COVID-19 initially admitted to the ICU for heated high flow.  He's now in the progressive care unit.  His oxygen requirement has been persistently high since coming off heated high flow, requiring NRB and [place] most of the time.  Pulmonology was c/s and recommended steroid taper and MBS which reviewed silent aspiration.  Speech is following and has provided recommendations.  Receiving lasix for lower extremity edema.  Slow improvement.  Assessment & Plan:   Principal Problem:   Acute respiratory failure with hypoxia (HCC) Active Problems:   Hypertension   Hypercholesterolemia   Benign prostate hyperplasia   ILD (interstitial lung disease) (HCC)   Postinflammatory pulmonary fibrosis (HCC)   HCAP (healthcare-associated pneumonia)   Chronic obstructive pulmonary disease (HCC)   Pneumonia due to COVID-19 virus   Severe sepsis (HCC)   Essential hypertension   Diabetes mellitus type 2, controlled, with complications ([place])   Pulmonary fibrosis (HCC)   Hypersensitivity pneumonitis (HCC)  Acute Hypoxic Respiratory Failure due to Covid-19 Viral Illness  Pneumomediastinum -Patient was admitted to the hospital with hypoxic respiratory failure due to COVID-19 pneumonia.  He finished Destani Wamser course of 5 days of remdesivir, received convalescent plasma on 11/30 as well as Actemra on 11/29. -He was on heated high flow from admission until ~12/7 - 12/8 per my review of chart and previous documentation of vitals.  10 L  HFNC this today.  Slightly better, though NRB is not charted and he had this with him when I was seeing him at the bedside, will discuss with nursing. -continue steroids, prednisone taper per pulmonology -Maintain negative fluid balance (currently net negative 12.1 L), continue Lasix (currently ordered daily - he has LE edema) -OOB, IS, prone as able -CT chest with small pneumomediastinum, supportive care - follow intermittent CXR  COVID-19 Labs  Recent Labs    03/26/19 0255 03/27/19 0550 03/28/19 0345  DDIMER 0.78* 0.87* 0.59*  FERRITIN 540* 569* 608*  CRP 1.7* 3.1* 2.0*    Lab Results  Component Value Date   SARSCOV2NAA POSITIVE ([place]) 02/05/2019   [place] NOT DETECTED 02/05/2019   [place] NOT DETECTED 01/09/2019   Pulmonary fibrosis/hypersensitivity pneumonitis -Follows with pulmonology as an outpatient, currently maintained on steroids will likely need Tyneka Scafidi very prolonged steroid taper - Pulm has been reconsulted due to his history of hypersensitivity pneumonitis and prolonged course - appreciate recommendations -> CT chest as above, wean steroids per pulm (taper down to 10 mg over 2 weeks) - MBS with silent aspiration (per pulm, can be similar to HSP on path), appreciate speech recs  Acute on chronic diastolic CHF -No 2D echo in the system as well he had Jenah Vanasten stress test in November 2020 with an EF of 52% -echo 12/21 with normal EF, indeterminate LV diastolic parameters (see report) - IVC is normal with > 50% resp var -LE edema persists, some mild improvement today -Continue lasix 60 mg BID  Lower extremity swelling -Patient's appreciates his left leg is more swollen than the right, will obtain venous Doppler (negative) -Continue Lasix as above  HCAP/severe sepsis  -Resolved, completed  Qamar Rosman course of cefepime.  Monitor off antibiotics  Essential hypertension -His blood pressure medications were on hold, now placed on Lasix, monitor  Type 2 diabetes mellitus   Hypoglycemia - AM hypoglycemia, improved this morning - follow AM cortisol - discontinued for some reason, but with resolution will continue to monitor.  Low dose SSI, decrease mealtime and follow.  Thrombocytopenia: improved  Elevated Creatinine: follow closely with diuresis, may have to hold lasix in Elvie [place]omo day or so  Hypokalemia: replace and follow  DVT prophylaxis: lovenox Code Status: DNR Family Communication: none at bedside - wife 12/25 Disposition Plan: pending further improvement  Consultants:   none  Procedures:   none  Antimicrobials:  Anti-infectives (From admission, onward)   Start     Dose/Rate Route Frequency Ordered Stop   03/01/19 0800  remdesivir 100 mg in sodium chloride 0.9 % 250 mL IVPB     100 mg 500 mL/hr over 30 Minutes Intravenous Every 24 hours 02/28/19 0823 03/04/19 1216   02/28/19 1000  remdesivir 200 mg in sodium chloride 0.9 % 250 mL IVPB     200 mg 500 mL/hr over 30 Minutes Intravenous Once 02/28/19 0823 02/28/19 1006   02/28/19 0000  vancomycin (VANCOCIN) 1,250 mg in sodium chloride 0.9 % 250 mL IVPB  Status:  Discontinued     1,250 mg 166.7 mL/hr over 90 Minutes Intravenous Every 12 hours 02/04/2019 1639 02/14/2019 2037   02/18/2019 2200  ceFEPIme (MAXIPIME) 2 g in sodium chloride 0.9 % 100 mL IVPB  Status:  Discontinued     2 g 200 mL/hr over 30 Minutes Intravenous Every 8 hours 02/19/2019 1629 03/03/19 1242   02/21/2019 1315  ceFEPIme (MAXIPIME) 2 g in sodium chloride 0.9 % 100 mL IVPB     2 g 200 mL/hr over 30 Minutes Intravenous  Once 02/12/2019 1306 02/28/19 0324   02/01/2019 1315  vancomycin (VANCOCIN) 2,000 mg in sodium chloride 0.9 % 500 mL IVPB     2,000 mg 250 mL/hr over 120 Minutes Intravenous  Once 02/19/2019 1311 02/28/19 0324       Subjective: No new complaints today Feels about the same  Objective: Vitals:   03/27/19 2249 03/27/19 2300 03/28/19 0607 03/28/19 0800  BP:    108/83  Pulse: 65 62 68 69  Resp: 20 (!) 21 20 (!) 22  Temp:     97.7 F (36.5 C)  TempSrc:    Oral  SpO2: 98% 99% 98% 97%  Weight:      Height:        Intake/Output Summary (Last 24 hours) at 03/28/2019 1823 Last data filed at 03/28/2019 1800 Gross per 24 hour  Intake 1540 ml  Output 4225 ml  Net -2685 ml   Filed Weights   02/28/19 0812 03/06/19 1607 03/23/19 0900  Weight: 130.7 kg 124.2 kg 118.2 kg    Examination:  General: No acute distress. Cardiovascular: RRR Lungs: unlabored, with [place] and NRB, no adventitious lung sounds Abdomen: Soft, nontender, nondistended Neurological: Alert and oriented 3. Moves all extremities 4 . Cranial nerves II through XII grossly intact. Skin: Warm and dry. No rashes or lesions. Extremities: No clubbing or cyanosis. Bilateral LE edema.   Data Reviewed: I have personally reviewed following labs and imaging studies  CBC: Recent Labs  Lab 03/24/19 0250 03/25/19 0400 03/26/19 0255 03/27/19 0550 03/28/19 0345  WBC 8.1 9.3 8.8 8.5 8.9  NEUTROABS 7.4 7.7 7.1 6.7 6.7  HGB 15.2 15.9 14.7 15.3 14.9  HCT 46.2 48.9  45.7 48.5 46.0  MCV 94.1 94.6 95.0 95.3 95.2  PLT 135* 171 162 191 0000000   Basic Metabolic Panel: Recent Labs  Lab 03/24/19 0250 03/25/19 0400 03/26/19 0255 03/27/19 0550 03/28/19 0345  NA 135 137 138 138 138  K 4.1 4.2 3.2* 3.6 3.6  CL 90* 92* 91* 91* 92*  CO2 32 35* 34* 36* 33*  GLUCOSE 110* 59* 45* 66* 85  BUN 56* 56* 53* 49* 46*  CREATININE 1.01 1.03 1.13 1.12 1.08  CALCIUM 9.0 9.3 8.9 9.0 8.9  MG 2.6* 2.6* 2.6* 2.7* 2.5*  PHOS 4.4 4.3 4.2 3.7 3.6   GFR: Estimated Creatinine Clearance: 78.4 mL/min (by C-G formula based on SCr of 1.08 mg/dL). Liver Function Tests: Recent Labs  Lab 03/24/19 0250 03/25/19 0400 03/26/19 0255 03/27/19 0550 03/28/19 0345  AST 26 27 18 19 21   ALT 79* 85* 70* 62* 64*  ALKPHOS 54 57 57 63 62  BILITOT 1.0 1.3* 1.3* 1.1 0.9  PROT 6.0* 6.2* 5.9* 6.3* 6.0*  ALBUMIN 3.3* 3.5 3.3* 3.3* 3.1*   No results for input(s): LIPASE, AMYLASE in the  last 168 hours. No results for input(s): AMMONIA in the last 168 hours. Coagulation Profile: No results for input(s): INR, PROTIME in the last 168 hours. Cardiac Enzymes: No results for input(s): CKTOTAL, CKMB, CKMBINDEX, TROPONINI in the last 168 hours. BNP (last 3 results) No results for input(s): PROBNP in the last 8760 hours. HbA1C: No results for input(s): HGBA1C in the last 72 hours. CBG: Recent Labs  Lab 03/27/19 1232 03/27/19 1557 03/28/19 0759 03/28/19 1140 03/28/19 1637  GLUCAP 201* 283* 99 195* 180*   Lipid Profile: No results for input(s): CHOL, HDL, LDLCALC, TRIG, CHOLHDL, LDLDIRECT in the last 72 hours. Thyroid Function Tests: No results for input(s): TSH, T4TOTAL, FREET4, T3FREE, THYROIDAB in the last 72 hours. Anemia Panel: Recent Labs    03/27/19 0550 03/28/19 0345  FERRITIN 569* 608*   Sepsis Labs: No results for input(s): PROCALCITON, LATICACIDVEN in the last 168 hours.  No results found for this or any previous visit (from the past 240 hour(s)).       Radiology Studies: No results found.      Scheduled Meds: . aspirin EC  81 mg Oral Daily  . enoxaparin (LOVENOX) injection  60 mg Subcutaneous Q24H  . feeding supplement (ENSURE ENLIVE)  237 mL Oral BID BM  . fluticasone  2 spray Each Nare Daily  . furosemide  60 mg Intravenous BID  . guaiFENesin  1,200 mg Oral BID  . insulin aspart  0-9 Units Subcutaneous TID WC  . insulin aspart  6 Units Subcutaneous TID WC  . latanoprost  1 drop Both Eyes QHS  . loratadine  10 mg Oral Daily  . mouth rinse  15 mL Mouth Rinse BID  . multivitamin with minerals  1 tablet Oral Daily  . omega-3 acid ethyl esters  1 g Oral Daily  . pantoprazole  40 mg Oral Daily  . predniSONE  40 mg Oral Q breakfast   Followed by  . [START ON 04/02/2019] predniSONE  20 mg Oral Q breakfast   Followed by  . [START ON 04/09/2019] predniSONE  10 mg Oral Q breakfast  . QUEtiapine  50 mg Oral QHS  . rosuvastatin  10 mg Oral  Daily  . timolol  1 drop Left Eye BID  . umeclidinium-vilanterol  1 puff Inhalation Daily   Continuous Infusions:   LOS: 29 days    Time spent:  over 30 min    Fayrene Helper, MD [facility] Pager AMION  If 7PM-7AM, please contact night-coverage [email or web address] Password [facility] 03/28/2019, 6:23 PM

## 2019-03-29 ENCOUNTER — Inpatient Hospital Stay (HOSPITAL_COMMUNITY): Payer: Medicare Other

## 2019-03-29 LAB — PHOSPHORUS: Phosphorus: 2.7 mg/dL (ref 2.5–4.6)

## 2019-03-29 LAB — CBC WITH DIFFERENTIAL/PLATELET
Abs Immature Granulocytes: 0.06 10*3/uL (ref 0.00–0.07)
Basophils Absolute: 0 10*3/uL (ref 0.0–0.1)
Basophils Relative: 0 %
Eosinophils Absolute: 0.4 10*3/uL (ref 0.0–0.5)
Eosinophils Relative: 5 %
HCT: 44.3 % (ref 39.0–52.0)
Hemoglobin: 14.1 g/dL (ref 13.0–17.0)
Immature Granulocytes: 1 %
Lymphocytes Relative: 10 %
Lymphs Abs: 0.8 10*3/uL (ref 0.7–4.0)
MCH: 30.4 pg (ref 26.0–34.0)
MCHC: 31.8 g/dL (ref 30.0–36.0)
MCV: 95.5 fL (ref 80.0–100.0)
Monocytes Absolute: 0.8 10*3/uL (ref 0.1–1.0)
Monocytes Relative: 9 %
Neutro Abs: 6.2 10*3/uL (ref 1.7–7.7)
Neutrophils Relative %: 75 %
Platelets: 193 10*3/uL (ref 150–400)
RBC: 4.64 MIL/uL (ref 4.22–5.81)
RDW: 13.9 % (ref 11.5–15.5)
WBC: 8.2 10*3/uL (ref 4.0–10.5)
nRBC: 0 % (ref 0.0–0.2)

## 2019-03-29 LAB — COMPREHENSIVE METABOLIC PANEL
ALT: 51 U/L — ABNORMAL HIGH (ref 0–44)
AST: 20 U/L (ref 15–41)
Albumin: 2.9 g/dL — ABNORMAL LOW (ref 3.5–5.0)
Alkaline Phosphatase: 57 U/L (ref 38–126)
Anion gap: 9 (ref 5–15)
BUN: 43 mg/dL — ABNORMAL HIGH (ref 8–23)
CO2: 33 mmol/L — ABNORMAL HIGH (ref 22–32)
Calcium: 8.9 mg/dL (ref 8.9–10.3)
Chloride: 92 mmol/L — ABNORMAL LOW (ref 98–111)
Creatinine, Ser: 1 mg/dL (ref 0.61–1.24)
GFR calc Af Amer: >60 mL/min (ref >60)
GFR calc non Af Amer: >60 mL/min (ref >60)
Glucose, Bld: 108 mg/dL — ABNORMAL HIGH (ref 70–99)
Potassium: 4.8 mmol/L (ref 3.5–5.1)
Sodium: 134 mmol/L — ABNORMAL LOW (ref 135–145)
Total Bilirubin: 0.8 mg/dL (ref 0.3–1.2)
Total Protein: 5.8 g/dL — ABNORMAL LOW (ref 6.5–8.1)

## 2019-03-29 LAB — BRAIN NATRIURETIC PEPTIDE: B Natriuretic Peptide: 654.4 pg/mL — ABNORMAL HIGH (ref 0.0–100.0)

## 2019-03-29 LAB — D-DIMER, QUANTITATIVE: D-Dimer, Quant: 0.58 ug/mL-FEU — ABNORMAL HIGH (ref 0.00–0.50)

## 2019-03-29 LAB — GLUCOSE, CAPILLARY
Glucose-Capillary: 108 mg/dL — ABNORMAL HIGH (ref 70–99)
Glucose-Capillary: 193 mg/dL — ABNORMAL HIGH (ref 70–99)
Glucose-Capillary: 226 mg/dL — ABNORMAL HIGH (ref 70–99)
Glucose-Capillary: 192 mg/dL — ABNORMAL HIGH (ref 70–99)

## 2019-03-29 LAB — FERRITIN: Ferritin: 541 ng/mL — ABNORMAL HIGH (ref 24–336)

## 2019-03-29 LAB — C-REACTIVE PROTEIN: CRP: 2.5 mg/dL — ABNORMAL HIGH (ref <1.0)

## 2019-03-29 LAB — MAGNESIUM: Magnesium: 2.6 mg/dL — ABNORMAL HIGH (ref 1.7–2.4)

## 2019-03-29 NOTE — Progress Notes (Signed)
PROGRESS NOTE    Justin Atkinson.  [id number] DOB: 09/12/1940 DOA: 02/14/2019 PCP: [name], MD   Brief Narrative:  78 year old  WM PMHx COPD/pulmonary fibrosis recently diagnosed hypersensitivity pneumonitis (had VATS wedge resection on 11/9;follows with Dr. Vaughan Browner) who was recently started on steroid on 11/18, HTN, diabetes type 2 uncontrolled with complication, HLD, and DJD   Presented with increasing shortness of breath with nasal congestion, cough for about 10 days. Also noticed that his oxygen dropped to 89% on room air. Called the pulmonary office who asked him to come to the ED. In the ED he is hypoxicat80%, tachypneic and mildly hypertensive. CT angiogram of the chest was negative for PE and showed bilateral diffuse groundglass opacity. Admitted to ICU on high flow started on IV Solu-Medrol. COVID-19 test returned as positive. In the ED he had elevated inflammatory markers including LDH, ferritin, CRP, lactic acid and D-dimer. Patient remains on high flow oxygen. 03/01/2019: Remains 100% high flow nasal cannula oxygen.  Transferring to [place] to continue treatment.  NOTE review of Dr. Vaughan Browner PCCM note 02/18/2019 patient was sent home on 2 L O2, pulmonary fibrosis, RIGHT VATS/11/9 performed showed hypersensitivity pneumonitis   Subjective: 12/27 last 24 hours hypothermic 36.4 C, A/O x4, negative CP, negative abdominal pain.  States he feels stronger and less winded.   Assessment & Plan:   Principal Problem:   Acute respiratory failure with hypoxia (HCC) Active Problems:   Hypertension   Hypercholesterolemia   Benign prostate hyperplasia   ILD (interstitial lung disease) (HCC)   Postinflammatory pulmonary fibrosis (HCC)   HCAP (healthcare-associated pneumonia)   Chronic obstructive pulmonary disease ([place])   Pneumonia due to COVID-19 virus   Severe sepsis ([place])   Essential hypertension   Diabetes mellitus type 2, controlled, with  complications ([place])   Pulmonary fibrosis (HCC)   Hypersensitivity pneumonitis ([place])   Covid pneumonia/acute respiratory failure with hypoxia COVID-19 Labs  Recent Labs    03/27/19 0550 03/28/19 0345 03/29/19 0335  DDIMER 0.87* 0.59* 0.58*  FERRITIN 569* 608* 541*  CRP 3.1* 2.0* 2.5*    Lab Results  Component Value Date   SARSCOV2NAA POSITIVE (A) 02/16/2019   SARSCOV2NAA NOT DETECTED 02/05/2019   SARSCOV2NAA NOT DETECTED 01/09/2019   -12/14 increase Solu-Medrol 60 mg BID--> 12/24 prednisone steroid taper started -Remdesivir per pharmacy protocol -11/29 Actemra x1 dose -11/30 Covid Convalescent Plasma x1  -Albuterol PRN -Flutter valve -Incentive spirometry -Titrate O2 to maintain SPO2 88-93% -Prone patient 16 hours/day; if patient cannot tolerate prone at least 2 to 3 hours per shift -Xanax PRN anxiety have decreased dose -Morphine PRN air hunger have decreased dose  HCAP/severe sepsis -Trend procalcitonin Results for VIVIN, FLY (MRN [id number]) as of 03/01/2019 18:22  Ref. Range 02/01/2019 11:47 02/28/2019 07:57 03/01/2019 03:33  Procalcitonin Latest Units: ng/mL 0.13 <0.10 <0.10  -Completed cefepime -12/10 sputum; normal respiratory flora  Pulmonary fibrosis/Hypersensitivity Pneumonitis -Per Dr. Vaughan Browner PCCM note 11/9 worsening pulmonary fibrosis s  -11/9 RIGHT VATS; per Dr. Vaughan Browner PCCM note showed hypersensitivity pneumonitis  -See Covid pneumonia  Hypertension -BP medication discontinued patient's BP has been running on the low side. -Lasix 60 mg BID  HLD -Crestor 10 mg daily  Diabetes type 2 controlled with complication -123456 hemoglobin A1c= 7.0 -Sensitive SSI -NovoLog 6 units qac  Insomnia -Patient on Seroquel 50 mg qhs   Daytime sleepiness -Slowly decreasing sedating medication benzodiazepine, opiates, Haldol    DVT prophylaxis: Lovenox  Code Status: Full Family Communication: 12/15 spoke  with daughter and wife counseled him on plan  of care answered all questions.  [name] his wife request that we contact [name] (daughter) as [place] [phone] Disposition Plan: TBD   Consultants:    Procedures/Significant Events:  11/30 Covid Convalescent Plasma x1    I have personally reviewed and interpreted all radiology studies and my findings are as above.  VENTILATOR SETTINGS: HFNC 12/27 flow rate; 10 L/min FiO2;  SPO2; 99%    Cultures 11/27 SARS coronavirus positive 11/29 influenza A/B negative 12/10 sputum normal respiratory flora    Antimicrobials: Anti-infectives (From admission, onward)   Start     Dose/Rate Stop   03/01/19 0800  remdesivir 100 mg in sodium chloride 0.9 % 250 mL IVPB     100 mg 500 mL/hr over 30 Minutes 03/04/19 1216   02/28/19 1000  remdesivir 200 mg in sodium chloride 0.9 % 250 mL IVPB     200 mg 500 mL/hr over 30 Minutes 02/28/19 1006   02/28/19 0000  vancomycin (VANCOCIN) 1,250 mg in sodium chloride 0.9 % 250 mL IVPB  Status:  Discontinued     1,250 mg 166.7 mL/hr over 90 Minutes 02/07/2019 2037   02/16/2019 2200  ceFEPIme (MAXIPIME) 2 g in sodium chloride 0.9 % 100 mL IVPB  Status:  Discontinued     2 g 200 mL/hr over 30 Minutes 03/03/19 1242   02/23/2019 1315  ceFEPIme (MAXIPIME) 2 g in sodium chloride 0.9 % 100 mL IVPB     2 g 200 mL/hr over 30 Minutes 02/28/19 0324   02/28/2019 1315  vancomycin (VANCOCIN) 2,000 mg in sodium chloride 0.9 % 500 mL IVPB     2,000 mg 250 mL/hr over 120 Minutes 02/28/19 0324       Devices    LINES / TUBES:      Continuous Infusions:    Objective: Vitals:   03/28/19 0607 03/28/19 0800 03/28/19 2145 03/29/19 0340  BP:  108/83 119/75 121/78  Pulse: 68 69 67 (!) 57  Resp: 20 (!) 22  14  Temp:  97.7 F (36.5 C) (!) 97.3 F (36.3 C) (!) 97.5 F (36.4 C)  TempSrc:  Oral Oral Oral  SpO2: 98% 97% 100% 100%  Weight:      Height:        Intake/Output Summary (Last 24 hours) at 03/29/2019 [id number] Last data filed at 03/28/2019 2000 Gross  per 24 hour  Intake 650 ml  Output 1250 ml  Net -600 ml   Filed Weights   02/28/19 0812 03/06/19 1607 03/23/19 0900  Weight: 130.7 kg 124.2 kg 118.2 kg   Physical Exam:  General: A/O x4, positive acute respiratory distress Eyes: negative scleral hemorrhage, negative anisocoria, negative icterus ENT: Negative Runny nose, negative gingival bleeding, Neck:  Negative scars, masses, torticollis, lymphadenopathy, JVD Lungs: Clear to auscultation bilaterally without wheezes or crackles Cardiovascular: Regular rate and rhythm without murmur gallop or rub normal S1 and S2 Abdomen: negative abdominal pain, nondistended, positive soft, bowel sounds, no rebound, no ascites, no appreciable mass Extremities: No significant cyanosis, clubbing, or edema bilateral lower extremities Skin: Negative rashes, lesions, ulcers Psychiatric:  Negative depression, negative anxiety, negative fatigue, negative mania  Central nervous system:  Cranial nerves II through XII intact, tongue/uvula midline, all extremities muscle strength 5/5, sensation intact throughout, negative dysarthria, negative expressive aphasia, negative receptive aphasia.    Data Reviewed: Care during the described time interval was provided by me .  I have reviewed this patient's available data, including medical history,  events of note, physical examination, and all test results as part of my evaluation.   CBC: Recent Labs  Lab 03/25/19 0400 03/26/19 0255 03/27/19 0550 03/28/19 0345 03/29/19 0335  WBC 9.3 8.8 8.5 8.9 8.2  NEUTROABS 7.7 7.1 6.7 6.7 6.2  HGB 15.9 14.7 15.3 14.9 14.1  HCT 48.9 45.7 48.5 46.0 44.3  MCV 94.6 95.0 95.3 95.2 95.5  PLT 171 162 191 181 0000000   Basic Metabolic Panel: Recent Labs  Lab 03/25/19 0400 03/26/19 0255 03/27/19 0550 03/28/19 0345 03/29/19 0335  NA 137 138 138 138 134*  K 4.2 3.2* 3.6 3.6 4.8  CL 92* 91* 91* 92* 92*  CO2 35* 34* 36* 33* 33*  GLUCOSE 59* 45* 66* 85 108*  BUN 56* 53* 49* 46*  43*  CREATININE 1.03 1.13 1.12 1.08 1.00  CALCIUM 9.3 8.9 9.0 8.9 8.9  MG 2.6* 2.6* 2.7* 2.5* 2.6*  PHOS 4.3 4.2 3.7 3.6 2.7   GFR: Estimated Creatinine Clearance: 84.6 mL/min (by C-G formula based on SCr of 1 mg/dL). Liver Function Tests: Recent Labs  Lab 03/25/19 0400 03/26/19 0255 03/27/19 0550 03/28/19 0345 03/29/19 0335  AST 27 18 19 21 20   ALT 85* 70* 62* 64* 51*  ALKPHOS 57 57 63 62 57  BILITOT 1.3* 1.3* 1.1 0.9 0.8  PROT 6.2* 5.9* 6.3* 6.0* 5.8*  ALBUMIN 3.5 3.3* 3.3* 3.1* 2.9*   No results for input(s): LIPASE, AMYLASE in the last 168 hours. No results for input(s): AMMONIA in the last 168 hours. Coagulation Profile: No results for input(s): INR, PROTIME in the last 168 hours. Cardiac Enzymes: No results for input(s): CKTOTAL, CKMB, CKMBINDEX, TROPONINI in the last 168 hours. BNP (last 3 results) No results for input(s): PROBNP in the last 8760 hours. HbA1C: No results for input(s): HGBA1C in the last 72 hours. CBG: Recent Labs  Lab 03/28/19 0759 03/28/19 1140 03/28/19 1637 03/28/19 2141 03/29/19 0757  GLUCAP 99 195* 180* 187* 108*   Lipid Profile: No results for input(s): CHOL, HDL, LDLCALC, TRIG, CHOLHDL, LDLDIRECT in the last 72 hours. Thyroid Function Tests: No results for input(s): TSH, T4TOTAL, FREET4, T3FREE, THYROIDAB in the last 72 hours. Anemia Panel: Recent Labs    03/28/19 0345 03/29/19 0335  FERRITIN 608* 541*   Urine analysis:    Component Value Date/Time   COLORURINE YELLOW 02/16/2019 1915   APPEARANCEUR CLEAR 02/08/2019 1915   LABSPEC >1.046 (H) 02/17/2019 1915   PHURINE 6.0 03/01/2019 1915   GLUCOSEU NEGATIVE 02/01/2019 1915   HGBUR NEGATIVE 02/22/2019 1915   [place] NEGATIVE 02/28/2019 [place] 02/26/2019 1915   PROTEINUR NEGATIVE 02/08/2019 1915   NITRITE NEGATIVE 02/09/2019 1915   LEUKOCYTESUR NEGATIVE 02/13/2019 1915   Sepsis Labs: @LABRCNTIP (procalcitonin:4,lacticidven:4)  ) No results found  for this or any previous visit (from the past 240 hour(s)).       Radiology Studies: DG CHEST PORT 1 VIEW  Result Date: 03/29/2019 CLINICAL DATA:  Hypoxia EXAM: PORTABLE CHEST 1 VIEW COMPARISON:  March 21, 2019 FINDINGS: The mediastinal contour and cardiac silhouette are stable. The heart size is enlarged. Increased pulmonary interstitium with patchy opacities are identified throughout the left lung and in the right mid and lower lung worsened compared prior exam. The bony structures are stable. IMPRESSION: Increased pulmonary interstitium with patchy opacities are identified throughout the left lung and in the right mid and lower lung worsened compared prior exam, superimposed pneumonia is not excluded. Electronically Signed   By: Mallie Darting.D.  On: 03/29/2019 08:10        Scheduled Meds: . aspirin EC  81 mg Oral Daily  . enoxaparin (LOVENOX) injection  60 mg Subcutaneous Q24H  . feeding supplement (ENSURE ENLIVE)  237 mL Oral BID BM  . fluticasone  2 spray Each Nare Daily  . furosemide  60 mg Intravenous BID  . guaiFENesin  1,200 mg Oral BID  . insulin aspart  0-9 Units Subcutaneous TID WC  . insulin aspart  6 Units Subcutaneous TID WC  . latanoprost  1 drop Both Eyes QHS  . loratadine  10 mg Oral Daily  . mouth rinse  15 mL Mouth Rinse BID  . multivitamin with minerals  1 tablet Oral Daily  . omega-3 acid ethyl esters  1 g Oral Daily  . pantoprazole  40 mg Oral Daily  . predniSONE  40 mg Oral Q breakfast   Followed by  . [START ON 04/02/2019] predniSONE  20 mg Oral Q breakfast   Followed by  . [START ON 04/09/2019] predniSONE  10 mg Oral Q breakfast  . QUEtiapine  50 mg Oral QHS  . rosuvastatin  10 mg Oral Daily  . timolol  1 drop Left Eye BID  . umeclidinium-vilanterol  1 puff Inhalation Daily   Continuous Infusions:    LOS: 30 days   The patient is critically ill with multiple organ systems failure and requires high complexity decision making for assessment  and support, frequent evaluation and titration of therapies, application of advanced monitoring technologies and extensive interpretation of multiple databases. Critical Care Time devoted to patient care services described in this note  Time spent: 40 minutes     Miasia Crabtree, Geraldo Docker, MD [facility] Pager [phone]  If 7PM-7AM, please contact night-coverage [email or web address] Password [email or web address] 03/29/2019, 8:22 AM

## 2019-03-29 NOTE — Progress Notes (Signed)
Patient ambulated (2 person assist) back to bed. Pre oxygenated with NRB due to 02 drop when ambulating. During transfer patient found in dry soiled underwear. Patient stated that could be from when he uses the urinal. After RN assisted patient back to bed she removed them and found excoriated skin due to the moisture. Peri care completed and moisture berrier cream applied. Patient refused full bath at this time. Complete linen change done.

## 2019-03-30 DIAGNOSIS — E876 Hypokalemia: Secondary | ICD-10-CM | POA: Diagnosis present

## 2019-03-30 DIAGNOSIS — R5381 Other malaise: Secondary | ICD-10-CM | POA: Diagnosis present

## 2019-03-30 LAB — GLUCOSE, CAPILLARY
Glucose-Capillary: 185 mg/dL — ABNORMAL HIGH (ref 70–99)
Glucose-Capillary: 110 mg/dL — ABNORMAL HIGH (ref 70–99)
Glucose-Capillary: 171 mg/dL — ABNORMAL HIGH (ref 70–99)
Glucose-Capillary: 198 mg/dL — ABNORMAL HIGH (ref 70–99)
Glucose-Capillary: 216 mg/dL — ABNORMAL HIGH (ref 70–99)
Glucose-Capillary: 239 mg/dL — ABNORMAL HIGH (ref 70–99)
Glucose-Capillary: 218 mg/dL — ABNORMAL HIGH (ref 70–99)

## 2019-03-30 LAB — COMPREHENSIVE METABOLIC PANEL
ALT: 51 U/L — ABNORMAL HIGH (ref 0–44)
AST: 24 U/L (ref 15–41)
Albumin: 3.2 g/dL — ABNORMAL LOW (ref 3.5–5.0)
Alkaline Phosphatase: 63 U/L (ref 38–126)
Anion gap: 13 (ref 5–15)
BUN: 38 mg/dL — ABNORMAL HIGH (ref 8–23)
CO2: 34 mmol/L — ABNORMAL HIGH (ref 22–32)
Calcium: 8.8 mg/dL — ABNORMAL LOW (ref 8.9–10.3)
Chloride: 90 mmol/L — ABNORMAL LOW (ref 98–111)
Creatinine, Ser: 0.96 mg/dL (ref 0.61–1.24)
GFR calc Af Amer: >60 mL/min (ref >60)
GFR calc non Af Amer: >60 mL/min (ref >60)
Glucose, Bld: 174 mg/dL — ABNORMAL HIGH (ref 70–99)
Potassium: 3.4 mmol/L — ABNORMAL LOW (ref 3.5–5.1)
Sodium: 137 mmol/L (ref 135–145)
Total Bilirubin: 0.9 mg/dL (ref 0.3–1.2)
Total Protein: 6.1 g/dL — ABNORMAL LOW (ref 6.5–8.1)

## 2019-03-30 LAB — CBC WITH DIFFERENTIAL/PLATELET
Abs Immature Granulocytes: 0.08 10*3/uL — ABNORMAL HIGH (ref 0.00–0.07)
Basophils Absolute: 0 10*3/uL (ref 0.0–0.1)
Basophils Relative: 0 %
Eosinophils Absolute: 0.3 10*3/uL (ref 0.0–0.5)
Eosinophils Relative: 2 %
HCT: 45.1 % (ref 39.0–52.0)
Hemoglobin: 14.8 g/dL (ref 13.0–17.0)
Immature Granulocytes: 1 %
Lymphocytes Relative: 3 %
Lymphs Abs: 0.4 10*3/uL — ABNORMAL LOW (ref 0.7–4.0)
MCH: 31.2 pg (ref 26.0–34.0)
MCHC: 32.8 g/dL (ref 30.0–36.0)
MCV: 94.9 fL (ref 80.0–100.0)
Monocytes Absolute: 0.7 10*3/uL (ref 0.1–1.0)
Monocytes Relative: 5 %
Neutro Abs: 11.2 10*3/uL — ABNORMAL HIGH (ref 1.7–7.7)
Neutrophils Relative %: 89 %
Platelets: 229 10*3/uL (ref 150–400)
RBC: 4.75 MIL/uL (ref 4.22–5.81)
RDW: 14 % (ref 11.5–15.5)
WBC: 12.6 10*3/uL — ABNORMAL HIGH (ref 4.0–10.5)
nRBC: 0 % (ref 0.0–0.2)

## 2019-03-30 LAB — D-DIMER, QUANTITATIVE: D-Dimer, Quant: 0.73 ug/mL-FEU — ABNORMAL HIGH (ref 0.00–0.50)

## 2019-03-30 LAB — MAGNESIUM: Magnesium: 2.2 mg/dL (ref 1.7–2.4)

## 2019-03-30 LAB — C-REACTIVE PROTEIN: CRP: 2.9 mg/dL — ABNORMAL HIGH (ref <1.0)

## 2019-03-30 LAB — FERRITIN: Ferritin: 521 ng/mL — ABNORMAL HIGH (ref 24–336)

## 2019-03-30 LAB — PHOSPHORUS: Phosphorus: 2.7 mg/dL (ref 2.5–4.6)

## 2019-03-30 MED ORDER — POTASSIUM CHLORIDE CRYS ER 20 MEQ PO TBCR
40.0000 meq | EXTENDED_RELEASE_TABLET | Freq: Two times a day (BID) | ORAL | Status: AC
  Administered 2019-03-30 (×2): 40 meq via ORAL
  Filled 2019-03-30 (×2): qty 2

## 2019-03-30 MED ORDER — FUROSEMIDE 10 MG/ML IJ SOLN
60.0000 mg | Freq: Every day | INTRAMUSCULAR | Status: DC
  Administered 2019-03-30 – 2019-03-31 (×2): 60 mg via INTRAVENOUS
  Filled 2019-03-30: qty 6

## 2019-03-30 NOTE — Progress Notes (Signed)
  Speech Language Pathology Treatment: Dysphagia  Patient Details Name: Justin Atkinson. MRN: [id number] DOB: 12/07/1940 Today's Date: 03/30/2019 Time: [id number] SLP Time Calculation (min) (ACUTE ONLY): 16 min  Assessment / Plan / Recommendation Clinical Impression  Pt seen for dysphagia goal of safety with recommendations and awareness and implementation of recommendations after MBS 12/24. Without prompting with introduction of this therapist he immediately stated "I have been turning my head to the right when I swallow" and pointed to sign on walls in front and behind him. He was currently on the bedpan and had been for sometime waiting for "the wave to hit" therefore unable to sit up and observe consumption of po's. Reviewed results of MBS and findings of osteophytes etc. Reminded/educated pt re: controllable factors such as upright positioning, taking rest breaks during meals, no talking while eating for safe consumption. He reported coughing some if not careful. Will continue efforts to observe with food/liquids.   HPI HPI: Pt is a 78 y.o. male recently admitted s/p VATS with lobe wedge resection (02/09/2019), now admitted 02/03/2019 with worsening cough and SOB; tested (+) COVID-19. [name] oxygen needs were not improving and critical care requested MBS. Other PMH includes COPD/pulmonary fibrosis, cervical DDD, HTN, DM, HLD.      SLP Plan  Continue with current plan of care       Recommendations  Diet recommendations: Regular;Thin liquid Liquids provided via: Straw;Cup Medication Administration: Whole meds with puree Supervision: Patient able to self feed;Intermittent supervision to cue for compensatory strategies Compensations: Slow rate;Small sips/bites;Other (Comment)(right head turn) Postural Changes and/or Swallow Maneuvers: Seated upright 90 degrees;Head turn right during swallow                Oral Care Recommendations: Oral care BID Follow up Recommendations: Inpatient  Rehab SLP Visit Diagnosis: Dysphagia, pharyngeal phase (R13.13) Plan: Continue with current plan of care       GO                Houston Siren 03/30/2019, 3:30 PM  Orbie Pyo Colvin Caroli.Ed Risk analyst [phone] Office [phone]

## 2019-03-30 NOTE — Progress Notes (Signed)
PROGRESS NOTE    Justin Atkinson.  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 Vaughan Browner) who was recently started on steroid on 11/18, HTN, diabetes type 2 uncontrolled with complication, HLD, and DJD   Presented with increasing shortness of breath with nasal congestion, cough for about 10 days. Also noticed that his oxygen dropped to 89% on room air. Called the pulmonary office who asked him to come to the ED. In the ED he is hypoxicat80%, tachypneic and mildly hypertensive. CT angiogram of the chest was negative for PE and showed bilateral diffuse groundglass opacity. Admitted to ICU on high flow started on IV Solu-Medrol. COVID-19 test returned as positive. In the ED he had elevated inflammatory markers including LDH, ferritin, CRP, lactic acid and D-dimer. Patient remains on high flow oxygen. 03/01/2019: Remains 100% high flow nasal cannula oxygen.  Transferring to [place] to continue treatment.  NOTE review of Dr. Vaughan Browner PCCM note 02/18/2019 patient was sent home on 2 L O2, pulmonary fibrosis, RIGHT VATS/11/9 performed showed hypersensitivity pneumonitis   Subjective: 12/28 last 24 hours afebrile A/O x4, negative CP, negative abdominal pain positive extreme weakness (unable to ambulate to bedside commode).   Assessment & Plan:   Principal Problem:   Acute respiratory failure with hypoxia (HCC) Active Problems:   Hypertension   Hypercholesterolemia   Benign prostate hyperplasia   ILD (interstitial lung disease) (HCC)   Postinflammatory pulmonary fibrosis (HCC)   HCAP (healthcare-associated pneumonia)   Chronic obstructive pulmonary disease ([place])   Pneumonia due to COVID-19 virus   Severe sepsis ([place])   Essential hypertension   Diabetes mellitus type 2, controlled, with  complications ([place])   Pulmonary fibrosis (HCC)   Hypersensitivity pneumonitis ([place])   Hypokalemia   Physical deconditioning   Covid pneumonia/acute respiratory failure with hypoxia COVID-19 Labs  Recent Labs    03/28/19 0345 03/29/19 0335 03/30/19 1150  DDIMER 0.59* 0.58* 0.73*  FERRITIN 608* 541* 521*  CRP 2.0* 2.5* 2.9*    Lab Results  Component Value Date   SARSCOV2NAA POSITIVE (A) 02/01/2019   SARSCOV2NAA NOT DETECTED 02/05/2019   SARSCOV2NAA NOT DETECTED 01/09/2019   -12/14 increase Solu-Medrol 60 mg BID--> 12/24 prednisone steroid taper started -Remdesivir per pharmacy protocol -11/29 Actemra x1 dose -11/30 Covid Convalescent Plasma x1  -Albuterol PRN -Flutter valve -Incentive spirometry -Titrate O2 to maintain SPO2 88-93% -Prone patient 16 hours/day; if patient cannot tolerate prone at least 2 to 3 hours per shift -Xanax PRN anxiety have decreased dose -Morphine PRN air hunger have decreased dose  HCAP/severe sepsis -Trend procalcitonin Results for Justin, Atkinson (MRN [id number]) as of 03/01/2019 18:22  Ref. Range 02/17/2019 11:47 02/28/2019 07:57 03/01/2019 03:33  Procalcitonin Latest Units: ng/mL 0.13 <0.10 <0.10  -Completed cefepime -12/10 sputum; normal respiratory flora  Pulmonary fibrosis/Hypersensitivity Pneumonitis -Per Dr. Vaughan Browner PCCM note 11/9 worsening pulmonary fibrosis s  -11/9 RIGHT VATS; per Dr. Vaughan Browner PCCM note showed hypersensitivity pneumonitis  -See Covid pneumonia  Hypertension -BP medication discontinued patient's BP has been running on the low side. -Decrease Lasix 60 mg daily   HLD -Crestor 10 mg daily  Diabetes type 2 controlled with complication -123456 hemoglobin A1c= 7.0 -Sensitive SSI -NovoLog 6 units qac  Insomnia -Patient on Seroquel 50 mg qhs   Daytime sleepiness -Resolved  Hypokalemia -Potassium goal> 4 -k-Dur 40 mEq BID x2 doses  Deconditioning -All of patient's respiratory status slowly improving  patient is becoming really deconditioned.    DVT prophylaxis: Lovenox  Code Status: Full Family Communication: 12/15 spoke with daughter and wife counseled him on plan of care answered all questions.  [name] his wife request that we contact [name] (daughter) as [place] [phone] Disposition Plan: TBD   Consultants:    Procedures/Significant Events:  11/30 Covid Convalescent Plasma x1    I have personally reviewed and interpreted all radiology studies and my findings are as above.  VENTILATOR SETTINGS: HFNC + NRB 12/28 flow rate; 10 L/min FiO2;  SPO2; 85%    Cultures 11/27 SARS coronavirus positive 11/29 influenza A/B negative 12/10 sputum normal respiratory flora    Antimicrobials: Anti-infectives (From admission, onward)   Start     Dose/Rate Stop   03/01/19 0800  remdesivir 100 mg in sodium chloride 0.9 % 250 mL IVPB     100 mg 500 mL/hr over 30 Minutes 03/04/19 1216   02/28/19 1000  remdesivir 200 mg in sodium chloride 0.9 % 250 mL IVPB     200 mg 500 mL/hr over 30 Minutes 02/28/19 1006   02/28/19 0000  vancomycin (VANCOCIN) 1,250 mg in sodium chloride 0.9 % 250 mL IVPB  Status:  Discontinued     1,250 mg 166.7 mL/hr over 90 Minutes 03/02/2019 2037   02/09/2019 2200  ceFEPIme (MAXIPIME) 2 g in sodium chloride 0.9 % 100 mL IVPB  Status:  Discontinued     2 g 200 mL/hr over 30 Minutes 03/03/19 1242   02/02/2019 1315  ceFEPIme (MAXIPIME) 2 g in sodium chloride 0.9 % 100 mL IVPB     2 g 200 mL/hr over 30 Minutes 02/28/19 0324   02/11/2019 1315  vancomycin (VANCOCIN) 2,000 mg in sodium chloride 0.9 % 500 mL IVPB     2,000 mg 250 mL/hr over 120 Minutes 02/28/19 0324       Devices    LINES / TUBES:      Continuous Infusions:    Objective: Vitals:   03/30/19 1445 03/30/19 1500 03/30/19 1515 03/30/19 1530  BP:      Pulse: 82 92 87 90  Resp: (!) 21 (!) 24 18 (!) 24  Temp:      TempSrc:      SpO2: 92% 93% (!) 88% (!) 89%  Weight:      Height:         Intake/Output Summary (Last 24 hours) at 03/30/2019 1542 Last data filed at 03/30/2019 1200 Gross per 24 hour  Intake 1194 ml  Output 3175 ml  Net -1981 ml   Filed Weights   02/28/19 0812 03/06/19 1607 03/23/19 0900  Weight: 130.7 kg 124.2 kg 118.2 kg   Physical Exam:  General: A/O x4, positive acute respiratory distress Eyes: negative scleral hemorrhage, negative anisocoria, negative icterus ENT: Negative Runny nose, negative gingival bleeding, Neck:  Negative scars, masses, torticollis, lymphadenopathy, JVD Lungs: Clear to auscultation bilaterally without wheezes or crackles Cardiovascular: Regular rate and rhythm without murmur gallop or rub normal S1 and S2 Abdomen: negative abdominal pain, nondistended, positive soft, bowel sounds, no rebound, no ascites, no appreciable mass Extremities: No significant cyanosis, clubbing, or edema bilateral lower extremities Skin: Negative rashes, lesions, ulcers Psychiatric:  Negative depression, negative anxiety, negative fatigue, negative mania  Central nervous system:  Cranial nerves II through XII intact, tongue/uvula midline, all extremities muscle strength 5/5, sensation intact throughout, negative dysarthria, negative expressive aphasia, negative receptive aphasia.    Data Reviewed: Care  during the described time interval was provided by me .  I have reviewed this patient's available data, including medical history, events of note, physical examination, and all test results as part of my evaluation.   CBC: Recent Labs  Lab 03/26/19 0255 03/27/19 0550 03/28/19 0345 03/29/19 0335 03/30/19 1150  WBC 8.8 8.5 8.9 8.2 12.6*  NEUTROABS 7.1 6.7 6.7 6.2 11.2*  HGB 14.7 15.3 14.9 14.1 14.8  HCT 45.7 48.5 46.0 44.3 45.1  MCV 95.0 95.3 95.2 95.5 94.9  PLT 162 191 181 193 [id number]   Basic Metabolic Panel: Recent Labs  Lab 03/26/19 0255 03/27/19 0550 03/28/19 0345 03/29/19 0335 03/30/19 1150  NA 138 138 138 134* 137  K 3.2* 3.6  3.6 4.8 3.4*  CL 91* 91* 92* 92* 90*  CO2 34* 36* 33* 33* 34*  GLUCOSE 45* 66* 85 108* 174*  BUN 53* 49* 46* 43* 38*  CREATININE 1.13 1.12 1.08 1.00 0.96  CALCIUM 8.9 9.0 8.9 8.9 8.8*  MG 2.6* 2.7* 2.5* 2.6* 2.2  PHOS 4.2 3.7 3.6 2.7 2.7   GFR: Estimated Creatinine Clearance: 88.1 mL/min (by C-G formula based on SCr of 0.96 mg/dL). Liver Function Tests: Recent Labs  Lab 03/26/19 0255 03/27/19 0550 03/28/19 0345 03/29/19 0335 03/30/19 1150  AST 18 19 21 20 24   ALT 70* 62* 64* 51* 51*  ALKPHOS 57 63 62 57 63  BILITOT 1.3* 1.1 0.9 0.8 0.9  PROT 5.9* 6.3* 6.0* 5.8* 6.1*  ALBUMIN 3.3* 3.3* 3.1* 2.9* 3.2*   No results for input(s): LIPASE, AMYLASE in the last 168 hours. No results for input(s): AMMONIA in the last 168 hours. Coagulation Profile: No results for input(s): INR, PROTIME in the last 168 hours. Cardiac Enzymes: No results for input(s): CKTOTAL, CKMB, CKMBINDEX, TROPONINI in the last 168 hours. BNP (last 3 results) No results for input(s): PROBNP in the last 8760 hours. HbA1C: No results for input(s): HGBA1C in the last 72 hours. CBG: Recent Labs  Lab 03/29/19 1238 03/29/19 1634 03/29/19 1954 03/30/19 0748 03/30/19 1151  GLUCAP 193* 226* 192* 110* 171*   Lipid Profile: No results for input(s): CHOL, HDL, LDLCALC, TRIG, CHOLHDL, LDLDIRECT in the last 72 hours. Thyroid Function Tests: No results for input(s): TSH, T4TOTAL, FREET4, T3FREE, THYROIDAB in the last 72 hours. Anemia Panel: Recent Labs    03/29/19 0335 03/30/19 1150  FERRITIN 541* 521*   Urine analysis:    Component Value Date/Time   COLORURINE YELLOW 03/01/2019 1915   APPEARANCEUR CLEAR 02/07/2019 1915   LABSPEC >1.046 (H) 02/28/2019 1915   PHURINE 6.0 02/04/2019 1915   GLUCOSEU NEGATIVE 02/10/2019 1915   HGBUR NEGATIVE 02/13/2019 1915   [place] NEGATIVE 02/19/2019 [place] 02/15/2019 1915   PROTEINUR NEGATIVE 02/05/2019 1915   NITRITE NEGATIVE 02/24/2019 1915    LEUKOCYTESUR NEGATIVE 02/01/2019 1915   Sepsis Labs: @LABRCNTIP (procalcitonin:4,lacticidven:4)  ) No results found for this or any previous visit (from the past 240 hour(s)).       Radiology Studies: DG CHEST PORT 1 VIEW  Result Date: 03/29/2019 CLINICAL DATA:  Hypoxia EXAM: PORTABLE CHEST 1 VIEW COMPARISON:  March 21, 2019 FINDINGS: The mediastinal contour and cardiac silhouette are stable. The heart size is enlarged. Increased pulmonary interstitium with patchy opacities are identified throughout the left lung and in the right mid and lower lung worsened compared prior exam. The bony structures are stable. IMPRESSION: Increased pulmonary interstitium with patchy opacities are identified throughout the left lung and in the right mid  and lower lung worsened compared prior exam, superimposed pneumonia is not excluded. Electronically Signed   By: Abelardo Diesel M.D.   On: 03/29/2019 08:10        Scheduled Meds: . aspirin EC  81 mg Oral Daily  . enoxaparin (LOVENOX) injection  60 mg Subcutaneous Q24H  . feeding supplement (ENSURE ENLIVE)  237 mL Oral BID BM  . fluticasone  2 spray Each Nare Daily  . furosemide  60 mg Intravenous Daily  . guaiFENesin  1,200 mg Oral BID  . insulin aspart  0-9 Units Subcutaneous TID WC  . insulin aspart  6 Units Subcutaneous TID WC  . latanoprost  1 drop Both Eyes QHS  . loratadine  10 mg Oral Daily  . mouth rinse  15 mL Mouth Rinse BID  . multivitamin with minerals  1 tablet Oral Daily  . omega-3 acid ethyl esters  1 g Oral Daily  . pantoprazole  40 mg Oral Daily  . potassium chloride  40 mEq Oral BID  . predniSONE  40 mg Oral Q breakfast   Followed by  . [START ON 04/02/2019] predniSONE  20 mg Oral Q breakfast   Followed by  . [START ON 04/09/2019] predniSONE  10 mg Oral Q breakfast  . QUEtiapine  50 mg Oral QHS  . rosuvastatin  10 mg Oral Daily  . timolol  1 drop Left Eye BID  . umeclidinium-vilanterol  1 puff Inhalation Daily    Continuous Infusions:    LOS: 31 days   The patient is critically ill with multiple organ systems failure and requires high complexity decision making for assessment and support, frequent evaluation and titration of therapies, application of advanced monitoring technologies and extensive interpretation of multiple databases. Critical Care Time devoted to patient care services described in this note  Time spent: 40 minutes     Rifky Lapre, Geraldo Docker, MD [facility] Pager [phone]  If 7PM-7AM, please contact night-coverage [email or web address] Password [facility] 03/30/2019, 3:42 PM

## 2019-03-30 NOTE — Progress Notes (Signed)
PT Cancellation Note  Patient Details Name: Justin Atkinson. MRN: [id number] DOB: 1941/02/05   Cancelled Treatment:    Reason Eval/Treat Not Completed: Patient at procedure or test/unavailable  Attempted to see twice. Patient working with SLP.    Arby Barrette, PT Pager [phone]   Rexanne Mano 03/30/2019, 3:26 PM

## 2019-03-31 LAB — CBC WITH DIFFERENTIAL/PLATELET
Abs Immature Granulocytes: 0.07 10*3/uL (ref 0.00–0.07)
Basophils Absolute: 0 10*3/uL (ref 0.0–0.1)
Basophils Relative: 0 %
Eosinophils Absolute: 0.3 10*3/uL (ref 0.0–0.5)
Eosinophils Relative: 3 %
HCT: 42.3 % (ref 39.0–52.0)
Hemoglobin: 13.6 g/dL (ref 13.0–17.0)
Immature Granulocytes: 1 %
Lymphocytes Relative: 8 %
Lymphs Abs: 0.8 10*3/uL (ref 0.7–4.0)
MCH: 31 pg (ref 26.0–34.0)
MCHC: 32.2 g/dL (ref 30.0–36.0)
MCV: 96.4 fL (ref 80.0–100.0)
Monocytes Absolute: 0.9 10*3/uL (ref 0.1–1.0)
Monocytes Relative: 9 %
Neutro Abs: 7.9 10*3/uL — ABNORMAL HIGH (ref 1.7–7.7)
Neutrophils Relative %: 79 %
Platelets: 199 10*3/uL (ref 150–400)
RBC: 4.39 MIL/uL (ref 4.22–5.81)
RDW: 14 % (ref 11.5–15.5)
WBC: 9.9 10*3/uL (ref 4.0–10.5)
nRBC: 0 % (ref 0.0–0.2)

## 2019-03-31 LAB — C-REACTIVE PROTEIN: CRP: 4.6 mg/dL — ABNORMAL HIGH (ref <1.0)

## 2019-03-31 LAB — LACTIC ACID, PLASMA
Lactic Acid, Venous: 1.3 mmol/L (ref 0.5–1.9)
Lactic Acid, Venous: 1.6 mmol/L (ref 0.5–1.9)

## 2019-03-31 LAB — COMPREHENSIVE METABOLIC PANEL
ALT: 45 U/L — ABNORMAL HIGH (ref 0–44)
AST: 20 U/L (ref 15–41)
Albumin: 3.2 g/dL — ABNORMAL LOW (ref 3.5–5.0)
Alkaline Phosphatase: 62 U/L (ref 38–126)
Anion gap: 10 (ref 5–15)
BUN: 36 mg/dL — ABNORMAL HIGH (ref 8–23)
CO2: 35 mmol/L — ABNORMAL HIGH (ref 22–32)
Calcium: 8.9 mg/dL (ref 8.9–10.3)
Chloride: 94 mmol/L — ABNORMAL LOW (ref 98–111)
Creatinine, Ser: 0.95 mg/dL (ref 0.61–1.24)
GFR calc Af Amer: >60 mL/min (ref >60)
GFR calc non Af Amer: >60 mL/min (ref >60)
Glucose, Bld: 106 mg/dL — ABNORMAL HIGH (ref 70–99)
Potassium: 4.3 mmol/L (ref 3.5–5.1)
Sodium: 139 mmol/L (ref 135–145)
Total Bilirubin: 1 mg/dL (ref 0.3–1.2)
Total Protein: 5.7 g/dL — ABNORMAL LOW (ref 6.5–8.1)

## 2019-03-31 LAB — GLUCOSE, CAPILLARY
Glucose-Capillary: 114 mg/dL — ABNORMAL HIGH (ref 70–99)
Glucose-Capillary: 162 mg/dL — ABNORMAL HIGH (ref 70–99)
Glucose-Capillary: 152 mg/dL — ABNORMAL HIGH (ref 70–99)
Glucose-Capillary: 211 mg/dL — ABNORMAL HIGH (ref 70–99)

## 2019-03-31 LAB — PROCALCITONIN: Procalcitonin: <0.1 ng/mL

## 2019-03-31 LAB — PHOSPHORUS: Phosphorus: 3.4 mg/dL (ref 2.5–4.6)

## 2019-03-31 LAB — D-DIMER, QUANTITATIVE: D-Dimer, Quant: 0.72 ug/mL-FEU — ABNORMAL HIGH (ref 0.00–0.50)

## 2019-03-31 LAB — MAGNESIUM: Magnesium: 2.3 mg/dL (ref 1.7–2.4)

## 2019-03-31 LAB — FERRITIN: Ferritin: 529 ng/mL — ABNORMAL HIGH (ref 24–336)

## 2019-03-31 MED ORDER — DULOXETINE HCL 20 MG PO CPEP
20.0000 mg | ORAL_CAPSULE | Freq: Every day | ORAL | Status: DC
  Administered 2019-03-31 – 2019-04-06 (×7): 20 mg via ORAL
  Filled 2019-03-31 (×8): qty 1

## 2019-03-31 MED ORDER — ALPRAZOLAM 0.5 MG PO TABS
0.5000 mg | ORAL_TABLET | Freq: Two times a day (BID) | ORAL | Status: DC | PRN
  Administered 2019-04-01 – 2019-04-05 (×4): 0.5 mg via ORAL
  Filled 2019-03-31 (×4): qty 1

## 2019-03-31 NOTE — Progress Notes (Signed)
Physical Therapy Treatment Patient Details Name: Justin Atkinson. MRN: [id number] DOB: Dec 30, 1940 Today's Date: 03/31/2019    History of Present Illness Pt is a 78 y.o. male recently admitted s/p VATS with lobe wedge resection (02/09/2019), now admitted 02/19/2019 with worsening cough and SOB; tested (+) COVID-19. Other PMH includes COPD/pulmonary fibrosis, cervical DDD, HTN, DM, HLD.    PT Comments    Pt needed two people to get him out of lower recliner chair and back into bed after being in the chair for 2 hours using the sara steady.  Pt visibly fatigued and desats similar to earlier session today (see details below).  He remains very motivated to get better and to rehab.  I was able to reach out to the chaplin for some spiritual support and he should be here this PM.  PT will continue to follow acutely for safe mobility progression.   Follow Up Recommendations  CIR;Supervision for mobility/OOB     Equipment Recommendations  Rolling walker with 5" wheels;Other (comment)(likely post acute O2)    Recommendations for Other Services   NA     Precautions / Restrictions Precautions Precautions: Fall;Other (comment) Precaution Comments: O2 sats 75% with mobility, 85% at rest    Mobility  Bed Mobility Overal bed mobility: Needs Assistance Bed Mobility: Sit to Sidelying     Supine to sit: Mod assist;HOB elevated   Sit to sidelying: Mod assist;+2 for physical assistance General bed mobility comments: Two person mod assist to go from sitting to sidelying one assist at his head, one at his feet.   Transfers Overall transfer level: Needs assistance   Transfers: Sit to/from Stand;Stand Pivot Transfers Sit to Stand: +2 physical assistance;Mod assist Stand pivot transfers: From elevated surface(seated on steady)       General transfer comment: Attempted x 2 to get up from chair using momentum and one person assist, but unable, so called RN tech into room and we were able to do it  with second person, sat on steady for transfer back to bed.  Pt was OOB in the chair for two hours and he verbalized wanting to get back up to the chair for his supper.      Balance Overall balance assessment: Needs assistance Sitting-balance support: Feet supported;Bilateral upper extremity supported Sitting balance-Leahy Scale: Fair     Standing balance support: Bilateral upper extremity supported Standing balance-Leahy Scale: Poor Standing balance comment: needs external support in standing                            Cognition Arousal/Alertness: Awake/alert Behavior During Therapy: Anxious(fearful.  Per RN MD orderd something for anxiety/depression) Overall Cognitive Status: Within Functional Limits for tasks assessed                                 General Comments: reporting fatigue      Exercises Other Exercises Other Exercises: IS and flutter valve 3reps, 3 sets with recovery inbetween.  Max observed IS 750 mL cues for slower breath Other Exercises: Pt reports compliance with bed exercises    General Comments General comments (skin integrity, edema, etc.): I told pt that the chaplin had been contacted and would be in to see him this afternoon for moral/spiritual support.  Pt desated to the 70s on 15 L NRB and 15L HFNC per pedi earlobe probe.  He took ~8 mins to recover  to 85% with rest, RR in the 30s, mouth and belly breathing.       Pertinent Vitals/Pain Pain Assessment: Faces Faces Pain Scale: Hurts little more Pain Location: per pt buttocks is starting to hurt Pain Descriptors / Indicators: Discomfort;Grimacing;Guarding Pain Intervention(s): Limited activity within patient's tolerance;Monitored during session;Repositioned           PT Goals (current goals can now be found in the care plan section) Acute Rehab PT Goals Patient Stated Goal: to not die and to do everything he can within his power to get stronger Progress towards PT goals:  Progressing toward goals(pt very motivated)    Frequency    Min 3X/week      PT Plan Current plan remains appropriate       AM-PAC PT "6 Clicks" Mobility   Outcome Measure  Help needed turning from your back to your side while in a flat bed without using bedrails?: A Lot Help needed moving from lying on your back to sitting on the side of a flat bed without using bedrails?: A Lot Help needed moving to and from a bed to a chair (including a wheelchair)?: A Lot Help needed standing up from a chair using your arms (e.g., wheelchair or bedside chair)?: A Lot Help needed to walk in hospital room?: Total Help needed climbing 3-5 steps with a railing? : Total 6 Click Score: 10    End of Session Equipment Utilized During Treatment: Oxygen;Gait belt Activity Tolerance: Patient limited by fatigue;Patient limited by pain Patient left: in bed;with call bell/phone within reach   PT Visit Diagnosis: Muscle weakness (generalized) (M62.81);Difficulty in walking, not elsewhere classified (R26.2)     Time: 1400-1430 PT Time Calculation (min) (ACUTE ONLY): 30 min  Charges:  $Therapeutic Exercise: 8-22 mins $Therapeutic Activity: 23-37 mins                    Verdene Lennert, PT, DPT  Acute Rehabilitation [phone] pager #[phone] office  @ [place]: [phone]    03/31/2019, 2:41 PM

## 2019-03-31 NOTE — Progress Notes (Signed)
Inpatient Rehabilitation Admissions Coordinator  I spoke with therapy, Wells Guiles as well as Dr. Sherral Hammers concerning medical readiness for an inpt rehab /CIR admit. Dr. Sherral Hammers feels pt medically ready. I contacted [name] wife, patient and daughter, [name], by phone to discuss goals and expectations of an inpt rehab admit. They all are in agreement when felt pt ready. Dr. Naaman Plummer and I are discussing timing of admit pending further discussions with medical , nursing and therapy staff at [facility]. I will follow up tomorrow.  Danne Baxter, RN, MSN Rehab Admissions Coordinator [phone] 03/31/2019 4:21 PM

## 2019-03-31 NOTE — Progress Notes (Signed)
Physical Therapy Treatment Patient Details Name: Justin Atkinson. MRN: [id number] DOB: 05/15/1940 Today's Date: 03/31/2019    History of Present Illness Pt is a 78 y.o. male recently admitted s/p VATS with lobe wedge resection (02/09/2019), now admitted 02/26/2019 with worsening cough and SOB; tested (+) COVID-19. Other PMH includes COPD/pulmonary fibrosis, cervical DDD, HTN, DM, HLD.    PT Comments    Pt is weaker and more debilitated since our last session (12/24).  He needed the support of the steady standing frame to get up OOB today (was transferring with RW, standing and marching with RW).  He is max on HFNC and NRB both at 15L with allowable decreased sats.  He sits at 85-90 at rest and desats into the 70s with mobility.  He would benefit from as much daily therapy as we can provide (will attempt to coordinate with OT for separate days) as he is motivated.   Follow Up Recommendations  CIR;Supervision for mobility/OOB     Equipment Recommendations  Rolling walker with 5" wheels;Other (comment)    Recommendations for Other Services   Chaplin visits     Precautions / Restrictions Precautions Precautions: Fall;Other (comment) Precaution Comments: O2 sats 75% with mobility, 85% at rest    Mobility  Bed Mobility Overal bed mobility: Needs Assistance Bed Mobility: Supine to Sit     Supine to sit: Mod assist;HOB elevated     General bed mobility comments: Mod assist to support trunk mostly to get to sitting.    Transfers Overall transfer level: Needs assistance   Transfers: Sit to/from Stand;Stand Pivot Transfers Sit to Stand: From elevated surface;Mod assist Stand pivot transfers: From elevated surface(seated on steady standing frame)       General transfer comment: Mod assist to stand from elevated bed to steady standing frame.  Transferred seated and stood with min assist from higher steady seat.             Cognition Arousal/Alertness: Awake/alert Behavior  During Therapy: Anxious(fearful) Overall Cognitive Status: Within Functional Limits for tasks assessed                                 General Comments: Asking MD if he is going to die, wants to do everything within his power to get better      Exercises Other Exercises Other Exercises: IS and flutter valve 3reps, 3 sets with recovery inbetween.  Max observed IS 750 mL cues for slower breath Other Exercises: Pt reports compliance with bed exercises    General Comments General comments (skin integrity, edema, etc.): Pt sitting at mid to upper 80s at rest on max HFNC and max NRB mask.  Decreased to the 70s during transfer with 5-8 mins of seated recovery.        Pertinent Vitals/Pain Pain Assessment: Faces Faces Pain Scale: Hurts little more Pain Location: per pt buttocks is starting to hurt           PT Goals (current goals can now be found in the care plan section) Acute Rehab PT Goals Patient Stated Goal: to not die Progress towards PT goals: Not progressing toward goals - comment(weaker since last session)    Frequency    Min 3X/week      PT Plan Current plan remains appropriate       AM-PAC PT "6 Clicks" Mobility   Outcome Measure  Help needed turning from your back to your side  while in a flat bed without using bedrails?: A Lot Help needed moving from lying on your back to sitting on the side of a flat bed without using bedrails?: A Lot Help needed moving to and from a bed to a chair (including a wheelchair)?: A Lot Help needed standing up from a chair using your arms (e.g., wheelchair or bedside chair)?: A Lot Help needed to walk in hospital room?: Total Help needed climbing 3-5 steps with a railing? : Total 6 Click Score: 10    End of Session Equipment Utilized During Treatment: Oxygen;Gait belt Activity Tolerance: Patient limited by fatigue Patient left: in chair;with call bell/phone within reach   PT Visit Diagnosis: Muscle weakness  (generalized) (M62.81);Difficulty in walking, not elsewhere classified (R26.2)     Time: [id number] PT Time Calculation (min) (ACUTE ONLY): 63 min  Charges:  $Therapeutic Exercise: 8-22 mins $Therapeutic Activity: 38-52 mins                    Verdene Lennert, PT, DPT  Acute Rehabilitation [phone] pager #[phone] office  @ [facility]: [phone]   03/31/2019, 12:29 PM

## 2019-03-31 NOTE — Progress Notes (Signed)
Nutrition Follow up  DOCUMENTATION CODES:   Obesity unspecified  INTERVENTION:   Liberalize diet to regular    Continue Ensure Enlive po BID, each supplement provides 350 kcal and 20 grams of protein.  Continue Magic cup BID with lunch and dinner, each supplement provides 290 kcal and 9 grams of protein, automatically on meal trays to optimize nutritional intake.   Continue MVI with minerals daily.  NUTRITION DIAGNOSIS:   Increased nutrient needs related to acute illness(COVID-19) as evidenced by estimated needs.  Ongoing  GOAL:   Patient will meet greater than or equal to 90% of their needs   Progressing   MONITOR:   PO intake, Supplement acceptance, Labs, Weight trends  REASON FOR ASSESSMENT:   Consult Assessment of nutrition requirement/status  ASSESSMENT:   78 yo male admitted with acute on chronic hypoxic respiratory failure. Tested positive for COVID-19. PMH includes hypersensitivity pneumonitis, COPD, pulmonary fibrosis, DM-2, HTN, HLD, DJD.   RD working remotely.  Meal completion charted as 25-75% for pt's last eight meals (avergae 53%). Refused Ensure twice today.   Spoke with RN who reports pt's appetite has declined likely related to depression and taste change. He consumed eggs, sausage, with coffee for breakfast and the meat from his lunch tray. Pt reports his breakfast was set in front of him, someone was supposed to set him up to eat, but never did. Pt upset about his meal being cold. Pt to be started on medication for depression. Continue to encourage supplements and meals. Consider nutrition support if intake declines further.   Admission weight: 130.7 kg  Current weight: 118.2 kg   I/O: -14, 700 ml since 12/15  UOP: 1,275 ml x 24 hrs   Medications: 60 mg lasix daily, SS novolog, MVI with minerals, prednisone Labs: CBG [id number]  Diet Order:   Diet Order            Diet heart healthy/carb modified Room service appropriate? Yes; Fluid  consistency: Thin  Diet effective now              EDUCATION NEEDS:   Not appropriate for education at this time  Skin:  Skin Assessment: Reviewed RN Assessment  Last BM:  12/29  Height:   Ht Readings from Last 1 Encounters:  02/28/19 6\' 1"  (1.854 m)    Weight:   Wt Readings from Last 1 Encounters:  03/23/19 118.2 kg    Ideal Body Weight:  83.6 kg  BMI:  Body mass index is 34.38 kg/m.  Estimated Nutritional Needs:   Kcal:  2400-2600  Protein:  125-150 gm  Fluid:  >/= 2.4 L   Mariana Single RD, LDN Clinical Nutrition Pager # [phone]

## 2019-03-31 NOTE — Progress Notes (Addendum)
Spiritual care received referral via page from PT.  Request for spiritual support, pt experiencing distress around lengthy hospitalization, course of illness.   Provided pastoral support and prayers with Mr Rosser at bedside.  He speaks with chaplain about his relationship with spouse (married 63 years).  She has been battling MS for a number of years and is bound to a wheelchair.  Mr Melgarejo expresses anxiety around course of hospitalization - noting desire to be home with her.   He also expresses some anger at self - stating he had a lung biopsy earlier and feels contracting COVID is connected to this - - he states he wishes he had stopped to reflect on this decision prior to going forward with biopsy.  However, notes he is working to remain positive, do everything he needs in order to move forward.   His faith figures prominently in his coping.  Though raised [place], he is episcopal along with his spouse.  He shared prayers for courage, strength, wisdom, patience.  He requests spiritual care contact his daughter, [name] and spouse and provide support as well as inform them that he is being supported at [facility].   This chaplain will follow up for continued support.    Thank you for consult.      Jerene Pitch, MDiv, [facility]  Lead Clinical Chaplain  [facility]    Clinical time: 1.25 hours

## 2019-03-31 NOTE — Progress Notes (Signed)
Wife called, she is updated, all questions answered.

## 2019-03-31 NOTE — Progress Notes (Signed)
PROGRESS NOTE    Justin Atkinson.  [id number] DOB: [birth year] DOA: 02/12/2019 PCP: [name], MD   Brief Narrative:  78 year old  WM PMHx COPD/pulmonary fibrosis recently diagnosed hypersensitivity pneumonitis (had VATS wedge resection on 11/9;follows with Dr. Vaughan Browner) who was recently started on steroid on 11/18, HTN, diabetes type 2 uncontrolled with complication, HLD, and DJD   Presented with increasing shortness of breath with nasal congestion, cough for about 10 days. Also noticed that his oxygen dropped to 89% on room air. Called the pulmonary office who asked him to come to the ED. In the ED he is hypoxicat80%, tachypneic and mildly hypertensive. CT angiogram of the chest was negative for PE and showed bilateral diffuse groundglass opacity. Admitted to ICU on high flow started on IV Solu-Medrol. COVID-19 test returned as positive. In the ED he had elevated inflammatory markers including LDH, ferritin, CRP, lactic acid and D-dimer. Patient remains on high flow oxygen. 03/01/2019: Remains 100% high flow nasal cannula oxygen.  Transferring to [place] to continue treatment.  NOTE review of Dr. Vaughan Browner PCCM note 02/18/2019 patient was sent home on 2 L O2, pulmonary fibrosis, RIGHT VATS/11/9 performed showed hypersensitivity pneumonitis   Subjective: 12/29 last 24 hours afebrile negative CP, negative abdominal pain.  Positive depression (asks is going to die).  Positive S OB    Assessment & Plan:   Principal Problem:   Acute respiratory failure with hypoxia (HCC) Active Problems:   Hypertension   Hypercholesterolemia   Benign prostate hyperplasia   ILD (interstitial lung disease) (HCC)   Postinflammatory pulmonary fibrosis (HCC)   HCAP (healthcare-associated pneumonia)   Chronic obstructive pulmonary disease ([place])   Pneumonia due to COVID-19 virus   Severe sepsis ([place])   Essential hypertension   Diabetes mellitus type 2, controlled, with  complications ([place])   Pulmonary fibrosis (HCC)   Hypersensitivity pneumonitis (HCC)   Hypokalemia   Physical deconditioning   Covid pneumonia/acute respiratory failure with hypoxia COVID-19 Labs  Recent Labs    03/29/19 0335 03/30/19 1150 03/31/19 0247  DDIMER 0.58* 0.73* 0.72*  FERRITIN 541* 521* 529*  CRP 2.5* 2.9* 4.6*    Lab Results  Component Value Date   SARSCOV2NAA POSITIVE (A) 02/12/2019   SARSCOV2NAA NOT DETECTED 02/05/2019   SARSCOV2NAA NOT DETECTED 01/09/2019   -12/14 increase Solu-Medrol 60 mg BID--> 12/24 prednisone steroid taper started -Remdesivir per pharmacy protocol -11/29 Actemra x1 dose -11/30 Covid Convalescent Plasma x1  -Albuterol PRN -Flutter valve -Incentive spirometry -Titrate O2 to maintain SPO2 88-93% -Prone patient 16 hours/day; if patient cannot tolerate prone at least 2 to 3 hours per shift -Morphine PRN air hunger have decreased dose  HCAP/severe sepsis -Trend procalcitonin Results for RALEN, KAMPE (MRN [id number]) as of 03/01/2019 18:22  Ref. Range 02/13/2019 11:47 02/28/2019 07:57 03/01/2019 03:33  Procalcitonin Latest Units: ng/mL 0.13 <0.10 <0.10  -Completed cefepime -12/10 sputum; normal respiratory flora -12/29 PCXR shows worsening infiltrates; procalcitonin/lactic acid pending  Pulmonary fibrosis/Hypersensitivity Pneumonitis -Per Dr. Vaughan Browner PCCM note 11/9 worsening pulmonary fibrosis s  -11/9 RIGHT VATS; per Dr. Vaughan Browner PCCM note showed hypersensitivity pneumonitis  -See Covid pneumonia  Hypertension -BP medication discontinued patient's BP has been running on the low side. -12/29 Lasix 60 mg daily (hold) pressure on the soft side    HLD -Crestor 10 mg daily  Diabetes type 2 controlled with complication -123456 hemoglobin A1c= 7.0 -Sensitive SSI -NovoLog 6 units qac  Anxiety/Depression -12/29 increase Xanax 0.5 mg BID PRN -12/29 Cymbalta 20 mg daily  Insomnia -Patient on Seroquel 50 mg qhs   Daytime  sleepiness -Resolved  Hypokalemia -Potassium goal> 4  Deconditioning -All of patient's respiratory status slowly improving patient is becoming really deconditioned.    DVT prophylaxis: Lovenox  Code Status: Full Family Communication: 12/15 spoke with daughter and wife counseled him on plan of care answered all questions.  [name] his wife request that we contact [name] (daughter) as [place] [phone] Disposition Plan: TBD   Consultants:    Procedures/Significant Events:  11/30 Covid Convalescent Plasma x1  12/22 CT chest Wo contrast;-interval development of small pneumomediastinum. -Diffuse interstitial and airspace densities slightly progressed since the prior CT. Clinical correlation and follow-up to solution recommended. -Aortic Atherosclerosis and Emphysema 12/27 PCXR;-increased pulmonary interstitium with patchy opacities are identified throughout the left lung and in the right mid and lower lung worsened compared prior exam, -Superimposed pneumonia is not excluded.     I have personally reviewed and interpreted all radiology studies and my findings are as above.  VENTILATOR SETTINGS: HFNC + NRB 12/29 flow rate; 15 L/min FiO2;  SPO2; 93%    Cultures 11/27 SARS coronavirus positive 11/29 influenza A/B negative 12/10 sputum normal respiratory flora    Antimicrobials: Anti-infectives (From admission, onward)   Start     Dose/Rate Stop   03/01/19 0800  remdesivir 100 mg in sodium chloride 0.9 % 250 mL IVPB     100 mg 500 mL/hr over 30 Minutes 03/04/19 1216   02/28/19 1000  remdesivir 200 mg in sodium chloride 0.9 % 250 mL IVPB     200 mg 500 mL/hr over 30 Minutes 02/28/19 1006   02/28/19 0000  vancomycin (VANCOCIN) 1,250 mg in sodium chloride 0.9 % 250 mL IVPB  Status:  Discontinued     1,250 mg 166.7 mL/hr over 90 Minutes 02/18/2019 2037   02/07/2019 2200  ceFEPIme (MAXIPIME) 2 g in sodium chloride 0.9 % 100 mL IVPB  Status:  Discontinued     2 g 200 mL/hr  over 30 Minutes 03/03/19 1242   02/18/2019 1315  ceFEPIme (MAXIPIME) 2 g in sodium chloride 0.9 % 100 mL IVPB     2 g 200 mL/hr over 30 Minutes 02/28/19 0324   02/22/2019 1315  vancomycin (VANCOCIN) 2,000 mg in sodium chloride 0.9 % 500 mL IVPB     2,000 mg 250 mL/hr over 120 Minutes 02/28/19 0324       Devices    LINES / TUBES:      Continuous Infusions:    Objective: Vitals:   03/31/19 0400 03/31/19 0500 03/31/19 0600 03/31/19 0743  BP: 122/79   (!) 101/44  Pulse: 64 69 69 73  Resp: (!) 22 (!) 23 (!) 23 (!) 22  Temp: 97.6 F (36.4 C)   98.1 F (36.7 C)  TempSrc: Axillary   Oral  SpO2: 94% 93% 95% 93%  Weight:      Height:        Intake/Output Summary (Last 24 hours) at 03/31/2019 0834 Last data filed at 03/30/2019 2200 Gross per 24 hour  Intake -  Output 1275 ml  Net -1275 ml   Filed Weights   02/28/19 0812 03/06/19 1607 03/23/19 0900  Weight: 130.7 kg 124.2 kg 118.2 kg   Physical Exam:  General: A/O x4, positive acute respiratory distress Eyes: negative scleral hemorrhage, negative anisocoria, negative icterus ENT: Negative Runny nose, negative gingival bleeding, Neck:  Negative scars, masses, torticollis, lymphadenopathy, JVD Lungs: Tachypneic, diffuse crackles,without wheezes or crackles Cardiovascular: Regular rate and rhythm without  murmur gallop or rub normal S1 and S2 Abdomen: negative abdominal pain, nondistended, positive soft, bowel sounds, no rebound, no ascites, no appreciable mass Extremities: No significant cyanosis, clubbing, or edema bilateral lower extremities Skin: Negative rashes, lesions, ulcers Psychiatric:  Negative depression, negative anxiety, negative fatigue, negative mania  Central nervous system:  Cranial nerves II through XII intact, tongue/uvula midline, all extremities muscle strength 5/5, sensation intact throughout, negative dysarthria, negative expressive aphasia, negative receptive aphasia.    Data Reviewed: Care during  the described time interval was provided by me .  I have reviewed this patient's available data, including medical history, events of note, physical examination, and all test results as part of my evaluation.   CBC: Recent Labs  Lab 03/27/19 0550 03/28/19 0345 03/29/19 0335 03/30/19 1150 03/31/19 0247  WBC 8.5 8.9 8.2 12.6* 9.9  NEUTROABS 6.7 6.7 6.2 11.2* 7.9*  HGB 15.3 14.9 14.1 14.8 13.6  HCT 48.5 46.0 44.3 45.1 42.3  MCV 95.3 95.2 95.5 94.9 96.4  PLT 191 181 193 229 [id number]   Basic Metabolic Panel: Recent Labs  Lab 03/27/19 0550 03/28/19 0345 03/29/19 0335 03/30/19 1150 03/31/19 0247  NA 138 138 134* 137 139  K 3.6 3.6 4.8 3.4* 4.3  CL 91* 92* 92* 90* 94*  CO2 36* 33* 33* 34* 35*  GLUCOSE 66* 85 108* 174* 106*  BUN 49* 46* 43* 38* 36*  CREATININE 1.12 1.08 1.00 0.96 0.95  CALCIUM 9.0 8.9 8.9 8.8* 8.9  MG 2.7* 2.5* 2.6* 2.2 2.3  PHOS 3.7 3.6 2.7 2.7 3.4   GFR: Estimated Creatinine Clearance: 89.1 mL/min (by C-G formula based on SCr of 0.95 mg/dL). Liver Function Tests: Recent Labs  Lab 03/27/19 0550 03/28/19 0345 03/29/19 0335 03/30/19 1150 03/31/19 0247  AST 19 21 20 24 20   ALT 62* 64* 51* 51* 45*  ALKPHOS 63 62 57 63 62  BILITOT 1.1 0.9 0.8 0.9 1.0  PROT 6.3* 6.0* 5.8* 6.1* 5.7*  ALBUMIN 3.3* 3.1* 2.9* 3.2* 3.2*   No results for input(s): LIPASE, AMYLASE in the last 168 hours. No results for input(s): AMMONIA in the last 168 hours. Coagulation Profile: No results for input(s): INR, PROTIME in the last 168 hours. Cardiac Enzymes: No results for input(s): CKTOTAL, CKMB, CKMBINDEX, TROPONINI in the last 168 hours. BNP (last 3 results) No results for input(s): PROBNP in the last 8760 hours. HbA1C: No results for input(s): HGBA1C in the last 72 hours. CBG: Recent Labs  Lab 03/30/19 1525 03/30/19 1606 03/30/19 1823 03/30/19 1958 03/31/19 0740  GLUCAP 198* 216* 239* 218* 114*   Lipid Profile: No results for input(s): CHOL, HDL, LDLCALC, TRIG,  CHOLHDL, LDLDIRECT in the last 72 hours. Thyroid Function Tests: No results for input(s): TSH, T4TOTAL, FREET4, T3FREE, THYROIDAB in the last 72 hours. Anemia Panel: Recent Labs    03/30/19 1150 03/31/19 0247  FERRITIN 521* 529*   Urine analysis:    Component Value Date/Time   COLORURINE YELLOW 02/02/2019 1915   APPEARANCEUR CLEAR 02/09/2019 1915   LABSPEC >1.046 (H) 02/22/2019 1915   PHURINE 6.0 02/03/2019 1915   GLUCOSEU NEGATIVE 02/02/2019 1915   HGBUR NEGATIVE 02/01/2019 1915   [place] NEGATIVE 02/07/2019 [place] 02/09/2019 1915   PROTEINUR NEGATIVE 02/26/2019 1915   NITRITE NEGATIVE 02/18/2019 1915   LEUKOCYTESUR NEGATIVE 02/13/2019 1915   Sepsis Labs: @LABRCNTIP (procalcitonin:4,lacticidven:4)  ) No results found for this or any previous visit (from the past 240 hour(s)).       Radiology Studies:  No results found.      Scheduled Meds: . aspirin EC  81 mg Oral Daily  . enoxaparin (LOVENOX) injection  60 mg Subcutaneous Q24H  . feeding supplement (ENSURE ENLIVE)  237 mL Oral BID BM  . fluticasone  2 spray Each Nare Daily  . furosemide  60 mg Intravenous Daily  . guaiFENesin  1,200 mg Oral BID  . insulin aspart  0-9 Units Subcutaneous TID WC  . insulin aspart  6 Units Subcutaneous TID WC  . latanoprost  1 drop Both Eyes QHS  . loratadine  10 mg Oral Daily  . mouth rinse  15 mL Mouth Rinse BID  . multivitamin with minerals  1 tablet Oral Daily  . omega-3 acid ethyl esters  1 g Oral Daily  . pantoprazole  40 mg Oral Daily  . predniSONE  40 mg Oral Q breakfast   Followed by  . [START ON 04/02/2019] predniSONE  20 mg Oral Q breakfast   Followed by  . [START ON 04/09/2019] predniSONE  10 mg Oral Q breakfast  . QUEtiapine  50 mg Oral QHS  . rosuvastatin  10 mg Oral Daily  . timolol  1 drop Left Eye BID  . umeclidinium-vilanterol  1 puff Inhalation Daily   Continuous Infusions:    LOS: 32 days   The patient is critically ill with  multiple organ systems failure and requires high complexity decision making for assessment and support, frequent evaluation and titration of therapies, application of advanced monitoring technologies and extensive interpretation of multiple databases. Critical Care Time devoted to patient care services described in this note  Time spent: 40 minutes     Hang Ammon, Geraldo Docker, MD [facility] Pager [phone]  If 7PM-7AM, please contact night-coverage [email or web address] Password [facility] 03/31/2019, 8:34 AM

## 2019-04-01 LAB — CBC WITH DIFFERENTIAL/PLATELET
Abs Immature Granulocytes: 0.07 10*3/uL (ref 0.00–0.07)
Basophils Absolute: 0 10*3/uL (ref 0.0–0.1)
Basophils Relative: 0 %
Eosinophils Absolute: 0.2 10*3/uL (ref 0.0–0.5)
Eosinophils Relative: 2 %
HCT: 38.4 % — ABNORMAL LOW (ref 39.0–52.0)
Hemoglobin: 12.3 g/dL — ABNORMAL LOW (ref 13.0–17.0)
Immature Granulocytes: 1 %
Lymphocytes Relative: 6 %
Lymphs Abs: 0.6 10*3/uL — ABNORMAL LOW (ref 0.7–4.0)
MCH: 30.4 pg (ref 26.0–34.0)
MCHC: 32 g/dL (ref 30.0–36.0)
MCV: 95 fL (ref 80.0–100.0)
Monocytes Absolute: 0.7 10*3/uL (ref 0.1–1.0)
Monocytes Relative: 8 %
Neutro Abs: 8 10*3/uL — ABNORMAL HIGH (ref 1.7–7.7)
Neutrophils Relative %: 83 %
Platelets: 196 10*3/uL (ref 150–400)
RBC: 4.04 MIL/uL — ABNORMAL LOW (ref 4.22–5.81)
RDW: 14 % (ref 11.5–15.5)
WBC: 9.7 10*3/uL (ref 4.0–10.5)
nRBC: 0 % (ref 0.0–0.2)

## 2019-04-01 LAB — GLUCOSE, CAPILLARY
Glucose-Capillary: 134 mg/dL — ABNORMAL HIGH (ref 70–99)
Glucose-Capillary: 147 mg/dL — ABNORMAL HIGH (ref 70–99)
Glucose-Capillary: 169 mg/dL — ABNORMAL HIGH (ref 70–99)
Glucose-Capillary: 138 mg/dL — ABNORMAL HIGH (ref 70–99)

## 2019-04-01 LAB — COMPREHENSIVE METABOLIC PANEL
ALT: 38 U/L (ref 0–44)
AST: 22 U/L (ref 15–41)
Albumin: 2.7 g/dL — ABNORMAL LOW (ref 3.5–5.0)
Alkaline Phosphatase: 64 U/L (ref 38–126)
Anion gap: 9 (ref 5–15)
BUN: 33 mg/dL — ABNORMAL HIGH (ref 8–23)
CO2: 34 mmol/L — ABNORMAL HIGH (ref 22–32)
Calcium: 8.8 mg/dL — ABNORMAL LOW (ref 8.9–10.3)
Chloride: 95 mmol/L — ABNORMAL LOW (ref 98–111)
Creatinine, Ser: 0.83 mg/dL (ref 0.61–1.24)
GFR calc Af Amer: >60 mL/min (ref >60)
GFR calc non Af Amer: >60 mL/min (ref >60)
Glucose, Bld: 139 mg/dL — ABNORMAL HIGH (ref 70–99)
Potassium: 3.8 mmol/L (ref 3.5–5.1)
Sodium: 138 mmol/L (ref 135–145)
Total Bilirubin: 1 mg/dL (ref 0.3–1.2)
Total Protein: 5.3 g/dL — ABNORMAL LOW (ref 6.5–8.1)

## 2019-04-01 LAB — D-DIMER, QUANTITATIVE: D-Dimer, Quant: 0.68 ug/mL-FEU — ABNORMAL HIGH (ref 0.00–0.50)

## 2019-04-01 LAB — PHOSPHORUS: Phosphorus: 3.6 mg/dL (ref 2.5–4.6)

## 2019-04-01 LAB — MAGNESIUM: Magnesium: 2.2 mg/dL (ref 1.7–2.4)

## 2019-04-01 LAB — FERRITIN: Ferritin: 488 ng/mL — ABNORMAL HIGH (ref 24–336)

## 2019-04-01 LAB — C-REACTIVE PROTEIN: CRP: 8.5 mg/dL — ABNORMAL HIGH (ref <1.0)

## 2019-04-01 MED ORDER — INSULIN GLARGINE 100 UNIT/ML ~~LOC~~ SOLN
8.0000 [IU] | Freq: Every day | SUBCUTANEOUS | Status: DC
  Administered 2019-04-01 – 2019-04-06 (×6): 8 [IU] via SUBCUTANEOUS
  Filled 2019-04-01 (×6): qty 0.08

## 2019-04-01 MED ORDER — PREDNISONE 20 MG PO TABS
50.0000 mg | ORAL_TABLET | Freq: Two times a day (BID) | ORAL | Status: DC
  Administered 2019-04-01 – 2019-04-02 (×3): 50 mg via ORAL
  Filled 2019-04-01 (×3): qty 1

## 2019-04-01 NOTE — Progress Notes (Signed)
Wife and daughter called, they are updated. All questions answered.

## 2019-04-01 NOTE — Progress Notes (Signed)
MESSAGE TO ON CALL REPORTING PT SATURATIONS ARE SUSTAINING IN 65-75% AND LOOSING RESERVE. WRITER HAS INTERVENED SINCE START OF SHIFT. AWAITNG MD ADVICE AT THIS TIME

## 2019-04-01 NOTE — Progress Notes (Signed)
Chaplain spoke with Mr Marn daughter, [name], at his request.  Provided support via phone.  [name] expressed appreciation that spiritual care is supporting Mr Khalid.  She is aware of methods for contacting spiritual care for support while Mr Hagedorn is in hospital.  She notes that he may move to inpatient rehab at [facility].  Chaplain assured availability of support at that facility as well.  Will follow up with Mr Jakob for continued support in [place].      Jerene Pitch, MDiv, [facility]  Lead Clinical Chaplain  [facility] [place].

## 2019-04-01 NOTE — Progress Notes (Signed)
Inpatient Rehabilitation Admissions Coordinator  Discussed with therapy and Nursing at [facility]. Patient not ready for admit to inpt rehab. I will follow his progress and assist with arranging inpt rehab admit .  Danne Baxter, RN, MSN Rehab Admissions Coordinator [phone] 04/01/2019 11:28 AM

## 2019-04-01 NOTE — Progress Notes (Signed)
04/01/2019 Pt looks a bit better this PM.  He is still maintaining low to mid 80s at rest on 15L HFNC and 15L NRB, however, he only dropped to the low 70s and his time to recovery was 6-8 mins instead of 12 mins compared to this AM's session.  We were also able to get out of the recliner chair with one person mod assist instead of 2 person mod assist he needed yesterday.  He sat up three hours today (2 hours yesterday).  Justin Atkinson is working very hard to do everything he can to help himself get better.  PT will continue to follow acutely for safe mobility progression.    04/01/19 1656  PT Visit Information  Last PT Received On 04/01/19  Assistance Needed +2  History of Present Illness Pt is a 78 y.o. male recently admitted s/p VATS with lobe wedge resection (02/09/2019), now admitted 02/22/2019 with worsening cough and SOB; tested (+) COVID-19. Other PMH includes COPD/pulmonary fibrosis, cervical DDD, HTN, DM, HLD.  Subjective Data  Patient Stated Goal to not die and to do everything he can within his power to get stronger  Precautions  Precautions Fall;Other (comment)  Precaution Comments O2 sats 75% with mobility, 85% at rest per MD parameters  Pain Assessment  Pain Assessment Faces  Faces Pain Scale 4  Pain Location generalized  Pain Descriptors / Indicators Discomfort;Grimacing;Guarding  Pain Intervention(s) Limited activity within patient's tolerance;Monitored during session;Repositioned  Cognition  Arousal/Alertness Awake/alert  Behavior During Therapy Anxious  Overall Cognitive Status Within Functional Limits for tasks assessed  General Comments Pt much more alert this PM.  Does not look as tired as he did in the AM.  Justin Atkinson he had a good day up in the chair and was up an hour longer than yesterday (3 hours today).    Bed Mobility  Overal bed mobility Needs Assistance  Bed Mobility Sit to Sidelying  Sit to sidelying Mod assist;HOB elevated  General bed mobility comments Mod assist to  help pt bring his legs up into bed into L sidelying.  Pt using the railing and momentum to help.   Transfers  Overall transfer level Needs assistance  Transfer via [place] to/from [employer] Transfers  Sit to Stand Mod assist;From elevated surface  Stand pivot transfers From elevated surface (seated on the steady)  General transfer comment I elevated pt's chair to be higher today and he was able to stand to the steady with just one peron mod assist after being up compared to yesterday's two person mod assist.  His recovery time after mobility was also shorter 6-8 mins to get back into the low 80s compared to this morning's 12 mins.  He continues to belly breathe with exertion and drop to the upper 60s and low 70s when he moves.  He remains on 15L HFNC and 15 L NRB.  He continues to work very hard on his recovery and is encouraged by the PM's improvements.    Balance  Overall balance assessment Needs assistance  Sitting-balance support Feet supported;Bilateral upper extremity supported  Sitting balance-Leahy Scale Fair  Sitting balance - Comments we did not stay EOB long before returning to sidelying in the bed.   Standing balance support Bilateral upper extremity supported  Standing balance-Leahy Scale Poor  Standing balance comment Needs external support in standing.   General Comments  General comments (skin integrity, edema, etc.) Pt reports he finds if he puts the NRB mask over his  mouth only it is better than his nose and his mouth.   PT - End of Session  Equipment Utilized During Treatment Oxygen;Gait belt  Activity Tolerance Patient limited by fatigue  Patient left in bed;with call bell/phone within reach  Nurse Communication Mobility status   PT - Assessment/Plan  PT Plan Current plan remains appropriate  PT Visit Diagnosis Muscle weakness (generalized) (M62.81);Difficulty in walking, not elsewhere classified (R26.2)  PT Frequency (ACUTE ONLY) Min  3X/week  Follow Up Recommendations CIR;Supervision for mobility/OOB  PT equipment Rolling walker with 5" wheels;3in1 (PT);Other (comment) (home O2)  AM-PAC PT "6 Clicks" Mobility Outcome Measure (Version 2)  Help needed turning from your back to your side while in a flat bed without using bedrails? 3  Help needed moving from lying on your back to sitting on the side of a flat bed without using bedrails? 2  Help needed moving to and from a bed to a chair (including a wheelchair)? 2  Help needed standing up from a chair using your arms (e.g., wheelchair or bedside chair)? 2  Help needed to walk in hospital room? 1  Help needed climbing 3-5 steps with a railing?  1  6 Click Score 11  Consider Recommendation of Discharge To: CIR/SNF/LTACH  PT Goal Progression  Progress towards PT goals Progressing toward goals  PT Time Calculation  PT Start Time (ACUTE ONLY) 1403  PT Stop Time (ACUTE ONLY) 1422  PT Time Calculation (min) (ACUTE ONLY) 19 min  PT General Charges  $$ ACUTE PT VISIT 1 Visit  PT Treatments  $Therapeutic Activity 8-22 mins    Verdene Lennert, PT, DPT  Acute Rehabilitation [phone] pager #[phone] office  @ [facility]: [phone]

## 2019-04-01 NOTE — Progress Notes (Signed)
PROGRESS NOTE    Justin Atkinson.  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 Vaughan Browner) who was recently started on steroid on 11/18, HTN, diabetes type 2 uncontrolled with complication, HLD, and DJD   Presented with increasing shortness of breath with nasal congestion, cough for about 10 days. Also noticed that his oxygen dropped to 89% on room air. Called the pulmonary office who asked him to come to the ED. In the ED he is hypoxicat80%, tachypneic and mildly hypertensive. CT angiogram of the chest was negative for PE and showed bilateral diffuse groundglass opacity. Admitted to ICU on high flow started on IV Solu-Medrol. COVID-19 test returned as positive. In the ED he had elevated inflammatory markers including LDH, ferritin, CRP, lactic acid and D-dimer. Patient remains on high flow oxygen. 03/01/2019: Remains 100% high flow nasal cannula oxygen.  Transferring to [place] to continue treatment.  NOTE review of Dr. Vaughan Browner PCCM note 02/18/2019 patient was sent home on 2 L O2, pulmonary fibrosis, RIGHT VATS/11/9 performed showed hypersensitivity pneumonitis   Subjective: 12/30 last 24 hours afebrile, negative abdominal pain.  Positive S OB   Assessment & Plan:   Principal Problem:   Acute respiratory failure with hypoxia (HCC) Active Problems:   Hypertension   Hypercholesterolemia   Benign prostate hyperplasia   ILD (interstitial lung disease) (HCC)   Postinflammatory pulmonary fibrosis (HCC)   HCAP (healthcare-associated pneumonia)   Chronic obstructive pulmonary disease ([place])   Pneumonia due to COVID-19 virus   Severe sepsis ([place])   Essential hypertension   Diabetes mellitus type 2, controlled, with complications ([place])   Pulmonary fibrosis (HCC)   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HCC)   Hypokalemia   Physical deconditioning   Covid pneumonia/acute respiratory failure with hypoxia COVID-19 Labs  Recent Labs    03/30/19 1150 03/31/19 0247 04/01/19 0135  DDIMER 0.73* 0.72* 0.68*  FERRITIN 521* 529* 488*  CRP 2.9* 4.6* 8.5*    Lab Results  Component Value Date   SARSCOV2NAA POSITIVE (A) 02/23/2019   SARSCOV2NAA NOT DETECTED 02/05/2019   SARSCOV2NAA NOT DETECTED 01/09/2019   -12/14 increase Solu-Medrol 60 mg BID--> 12/24 prednisone steroid taper started -Remdesivir per pharmacy protocol -11/29 Actemra x1 dose -11/30 Covid Convalescent Plasma x1  -Albuterol PRN -Flutter valve -Incentive spirometry -Titrate O2 to maintain SPO2 88-93% -Prone patient 16 hours/day; if patient cannot tolerate prone at least 2 to 3 hours per shift -Morphine PRN air hunger have decreased dose -12/30 DC prednisone taper.  Prednisone 50 mg BID  HCAP/severe sepsis -Trend procalcitonin Results for JAILEN, KREISER (MRN [id number]) as of 03/01/2019 18:22  Ref. Range 02/15/2019 11:47 02/28/2019 07:57 03/01/2019 03:33  Procalcitonin Latest Units: ng/mL 0.13 <0.10 <0.10  -Completed cefepime -12/10 sputum; normal respiratory flora -12/29 PCXR shows worsening infiltrates; procalcitonin/lactic acid WNL  Pulmonary fibrosis/Hypersensitivity Pneumonitis -Per Dr. Vaughan Browner PCCM note 11/9 worsening pulmonary fibrosis s  -11/9 RIGHT VATS; per Dr. Vaughan Browner PCCM note showed hypersensitivity pneumonitis  -See Covid pneumonia  Hypertension -BP medication discontinued patient's BP has been running on the low side. -12/29 Lasix 60 mg daily (hold) pressure on the soft side   -In and out -13.5 L -Daily weight  HLD -Crestor 10 mg daily  Diabetes type 2 controlled with complication -123456 hemoglobin A1c= 7.0 -Sensitive SSI -NovoLog 6 units qac -12/30 Lantus 8 units daily  Anxiety/Depression -12/29 increase  Xanax 0.5 mg BID PRN -12/29 Cymbalta 20 mg  daily  Insomnia -Patient on Seroquel 50 mg qhs   Daytime sleepiness -Resolved  Hypokalemia -Potassium goal> 4  Deconditioning -All of patient's respiratory status slowly improving patient is becoming really deconditioned.    DVT prophylaxis: Lovenox  Code Status: Full Family Communication: 12/15 spoke with daughter and wife counseled him on plan of care answered all questions.  [name] his wife request that we contact [name] (daughter) as [place] [phone] Disposition Plan: TBD   Consultants:    Procedures/Significant Events:  11/30 Covid Convalescent Plasma x1  12/22 CT chest Wo contrast;-interval development of small pneumomediastinum. -Diffuse interstitial and airspace densities slightly progressed since the prior CT. Clinical correlation and follow-up to solution recommended. -Aortic Atherosclerosis and Emphysema 12/27 PCXR;-increased pulmonary interstitium with patchy opacities are identified throughout the left lung and in the right mid and lower lung worsened compared prior exam, -Superimposed pneumonia is not excluded.     I have personally reviewed and interpreted all radiology studies and my findings are as above.  VENTILATOR SETTINGS: HFNC + NRB 12/30 flow rate; 15 L/min FiO2; 100% SPO2; 89%    Cultures 11/27 SARS coronavirus positive 11/29 influenza A/B negative 12/10 sputum normal respiratory flora    Antimicrobials: Anti-infectives (From admission, onward)   Start     Dose/Rate Stop   03/01/19 0800  remdesivir 100 mg in sodium chloride 0.9 % 250 mL IVPB     100 mg 500 mL/hr over 30 Minutes 03/04/19 1216   02/28/19 1000  remdesivir 200 mg in sodium chloride 0.9 % 250 mL IVPB     200 mg 500 mL/hr over 30 Minutes 02/28/19 1006   02/28/19 0000  vancomycin (VANCOCIN) 1,250 mg in sodium chloride 0.9 % 250 mL IVPB  Status:  Discontinued     1,250 mg 166.7 mL/hr over 90 Minutes 02/26/2019 2037   02/08/2019 2200  ceFEPIme (MAXIPIME) 2 g in sodium  chloride 0.9 % 100 mL IVPB  Status:  Discontinued     2 g 200 mL/hr over 30 Minutes 03/03/19 1242   02/25/2019 1315  ceFEPIme (MAXIPIME) 2 g in sodium chloride 0.9 % 100 mL IVPB     2 g 200 mL/hr over 30 Minutes 02/28/19 0324   02/06/2019 1315  vancomycin (VANCOCIN) 2,000 mg in sodium chloride 0.9 % 500 mL IVPB     2,000 mg 250 mL/hr over 120 Minutes 02/28/19 0324       Devices    LINES / TUBES:      Continuous Infusions:    Objective: Vitals:   03/31/19 1955 04/01/19 0004 04/01/19 0435 04/01/19 0745  BP: 117/63 (!) 114/56 116/62 113/64  Pulse: 95 69 72 74  Resp: (!) 27 19 (!) 25 (!) 25  Temp: 98.1 F (36.7 C) 98.7 F (37.1 C) (!) 97.1 F (36.2 C) (!) 97.5 F (36.4 C)  TempSrc: Axillary Axillary Axillary Axillary  SpO2: (!) 87% 94% (!) 82% (!) 83%  Weight:      Height:        Intake/Output Summary (Last 24 hours) at 04/01/2019 0911 Last data filed at 04/01/2019 0900 Gross per 24 hour  Intake 960 ml  Output 1550 ml  Net -590 ml   Filed Weights   02/28/19 0812 03/06/19 1607 03/23/19 0900  Weight: 130.7 kg 124.2 kg 118.2 kg   Physical Exam:  General: A/O x4, positive acute respiratory distress Eyes: negative scleral hemorrhage, negative anisocoria, negative icterus ENT: Negative Runny nose, negative gingival  bleeding, Neck:  Negative scars, masses, torticollis, lymphadenopathy, JVD Lungs: Clear to auscultation bilaterally without wheezes or crackles Cardiovascular: Regular rate and rhythm without murmur gallop or rub normal S1 and S2 Abdomen: negative abdominal pain, nondistended, positive soft, bowel sounds, no rebound, no ascites, no appreciable mass Extremities: No significant cyanosis, clubbing, or edema bilateral lower extremities Skin: Negative rashes, lesions, ulcers Psychiatric:  Negative depression, negative anxiety, negative fatigue, negative mania  Central nervous system:  Cranial nerves II through XII intact, tongue/uvula midline, all extremities  muscle strength 5/5, sensation intact throughout, negative dysarthria, negative expressive aphasia, negative receptive aphasia.     Data Reviewed: Care during the described time interval was provided by me .  I have reviewed this patient's available data, including medical history, events of note, physical examination, and all test results as part of my evaluation.   CBC: Recent Labs  Lab 03/28/19 0345 03/29/19 0335 03/30/19 1150 03/31/19 0247 04/01/19 0135  WBC 8.9 8.2 12.6* 9.9 9.7  NEUTROABS 6.7 6.2 11.2* 7.9* 8.0*  HGB 14.9 14.1 14.8 13.6 12.3*  HCT 46.0 44.3 45.1 42.3 38.4*  MCV 95.2 95.5 94.9 96.4 95.0  PLT 181 193 229 199 [id number]   Basic Metabolic Panel: Recent Labs  Lab 03/28/19 0345 03/29/19 0335 03/30/19 1150 03/31/19 0247 04/01/19 0135  NA 138 134* 137 139 138  K 3.6 4.8 3.4* 4.3 3.8  CL 92* 92* 90* 94* 95*  CO2 33* 33* 34* 35* 34*  GLUCOSE 85 108* 174* 106* 139*  BUN 46* 43* 38* 36* 33*  CREATININE 1.08 1.00 0.96 0.95 0.83  CALCIUM 8.9 8.9 8.8* 8.9 8.8*  MG 2.5* 2.6* 2.2 2.3 2.2  PHOS 3.6 2.7 2.7 3.4 3.6   GFR: Estimated Creatinine Clearance: 102 mL/min (by C-G formula based on SCr of 0.83 mg/dL). Liver Function Tests: Recent Labs  Lab 03/28/19 0345 03/29/19 0335 03/30/19 1150 03/31/19 0247 04/01/19 0135  AST 21 20 24 20 22   ALT 64* 51* 51* 45* 38  ALKPHOS 62 57 63 62 64  BILITOT 0.9 0.8 0.9 1.0 1.0  PROT 6.0* 5.8* 6.1* 5.7* 5.3*  ALBUMIN 3.1* 2.9* 3.2* 3.2* 2.7*   No results for input(s): LIPASE, AMYLASE in the last 168 hours. No results for input(s): AMMONIA in the last 168 hours. Coagulation Profile: No results for input(s): INR, PROTIME in the last 168 hours. Cardiac Enzymes: No results for input(s): CKTOTAL, CKMB, CKMBINDEX, TROPONINI in the last 168 hours. BNP (last 3 results) No results for input(s): PROBNP in the last 8760 hours. HbA1C: No results for input(s): HGBA1C in the last 72 hours. CBG: Recent Labs  Lab 03/31/19 0740  03/31/19 1142 03/31/19 1628 03/31/19 1954 04/01/19 0800  GLUCAP 114* 162* 152* 211* 134*   Lipid Profile: No results for input(s): CHOL, HDL, LDLCALC, TRIG, CHOLHDL, LDLDIRECT in the last 72 hours. Thyroid Function Tests: No results for input(s): TSH, T4TOTAL, FREET4, T3FREE, THYROIDAB in the last 72 hours. Anemia Panel: Recent Labs    03/31/19 0247 04/01/19 0135  FERRITIN 529* 488*   Urine analysis:    Component Value Date/Time   COLORURINE YELLOW 02/26/2019 1915   APPEARANCEUR CLEAR 02/23/2019 1915   LABSPEC >1.046 (H) 02/28/2019 1915   PHURINE 6.0 02/22/2019 1915   GLUCOSEU NEGATIVE 02/05/2019 1915   HGBUR NEGATIVE 02/18/2019 1915   [place] NEGATIVE 02/15/2019 [place] NEGATIVE 02/04/2019 1915   PROTEINUR NEGATIVE 02/26/2019 1915   NITRITE NEGATIVE 03/02/2019 1915   LEUKOCYTESUR NEGATIVE 02/10/2019 1915   Sepsis Labs: @  LABRCNTIP(procalcitonin:4,lacticidven:4)  ) No results found for this or any previous visit (from the past 240 hour(s)).       Radiology Studies: No results found.      Scheduled Meds: . aspirin EC  81 mg Oral Daily  . DULoxetine  20 mg Oral Daily  . enoxaparin (LOVENOX) injection  60 mg Subcutaneous Q24H  . feeding supplement (ENSURE ENLIVE)  237 mL Oral BID BM  . fluticasone  2 spray Each Nare Daily  . guaiFENesin  1,200 mg Oral BID  . insulin aspart  0-9 Units Subcutaneous TID WC  . insulin aspart  6 Units Subcutaneous TID WC  . latanoprost  1 drop Both Eyes QHS  . loratadine  10 mg Oral Daily  . mouth rinse  15 mL Mouth Rinse BID  . multivitamin with minerals  1 tablet Oral Daily  . omega-3 acid ethyl esters  1 g Oral Daily  . pantoprazole  40 mg Oral Daily  . [START ON 04/02/2019] predniSONE  20 mg Oral Q breakfast   Followed by  . [START ON 04/09/2019] predniSONE  10 mg Oral Q breakfast  . QUEtiapine  50 mg Oral QHS  . rosuvastatin  10 mg Oral Daily  . timolol  1 drop Left Eye BID  . umeclidinium-vilanterol  1  puff Inhalation Daily   Continuous Infusions:    LOS: 33 days   The patient is critically ill with multiple organ systems failure and requires high complexity decision making for assessment and support, frequent evaluation and titration of therapies, application of advanced monitoring technologies and extensive interpretation of multiple databases. Critical Care Time devoted to patient care services described in this note  Time spent: 40 minutes     Jadrien Narine, Geraldo Docker, MD [facility] Pager [phone]  If 7PM-7AM, please contact night-coverage [email or web address] Password [email or web address] 04/01/2019, 9:11 AM

## 2019-04-01 NOTE — Progress Notes (Signed)
Physical Therapy Treatment Patient Details Name: Justin Atkinson. MRN: [id number] DOB: January 14, 1941 Today's Date: 04/01/2019    History of Present Illness Pt is a 78 y.o. male recently admitted s/p VATS with lobe wedge resection (02/09/2019), now admitted 02/17/2019 with worsening cough and SOB; tested (+) COVID-19. Other PMH includes COPD/pulmonary fibrosis, cervical DDD, HTN, DM, HLD.    PT Comments    Pt resting (80%) and mobilizing (low 60s) O2 sats were not as good today compared to yesterday.  He also spent much longer recovering his sats with mobility OOB to chair with the standing frame taking >12 mins to regain sats in the low 80s.  He continues to work very hard on LE exercises to tolerance and breathing exercises requiring rest breaks every few repetitions.  He is excited about the idea of inpatient rehab.  He did have quite a bit of belly breathing with exertion and recovery today.  He looked better at the end of the session when I left him (still sating 80-82% at rest on 15l HFNC and 15L NRB).  PT will continue to work with pt to build tolerance for possible d/c to CIR soon.     Follow Up Recommendations  CIR;Supervision for mobility/OOB     Equipment Recommendations  Rolling walker with 5" wheels;3in1 (PT);Other (comment)(likely home O2)    Recommendations for Other Services Rehab consult     Precautions / Restrictions Precautions Precautions: Fall;Other (comment) Precaution Comments: O2 sats 75% with mobility, 85% at rest per MD parameters    Mobility  Bed Mobility Overal bed mobility: Needs Assistance Bed Mobility: Rolling;Sidelying to Sit Rolling: Modified independent (Device/Increase time) Sidelying to sit: Mod assist       General bed mobility comments: Pt able to reach over to railing to get himself in sidelying, mod assist to help boost trunk up to sitting EOB.  Sat EOB for ~10 mins attempting to recover sats (started at 84% at rest on 15 L NRB, 15L HFNC), He  was only able to recover to 80% in sitting so we went ahead and transferred to the chair.    Transfers Overall transfer level: Needs assistance   Transfers: Sit to/from Stand;Stand Pivot Transfers Sit to Stand: Mod assist;From elevated surface Stand pivot transfers: From elevated surface(seated on the steady)       General transfer comment: Pt lighter mod assist to come to standing today, likely as he knew what to expect from using the standing frame yesterday and hopefully because of better strength from two days of therapy in a row.  Pt sat to transfer on the steady and came up again from the higher seat with min assist.          Balance Overall balance assessment: Needs assistance Sitting-balance support: Feet supported;Bilateral upper extremity supported Sitting balance-Leahy Scale: Fair     Standing balance support: Bilateral upper extremity supported Standing balance-Leahy Scale: Poor Standing balance comment: Needs external support in standing.                             Cognition Arousal/Alertness: Lethargic(pt looks exhausted) Behavior During Therapy: Anxious Overall Cognitive Status: Within Functional Limits for tasks assessed                                        Exercises General Exercises - Lower Extremity Ankle Circles/Pumps:  AROM;Both;10 reps Long Arc Quad: AROM;Both;5 reps(2 sets, 5 reps each resting between) Hip Flexion/Marching: AROM;Both;5 reps(2 sets 5 reps each resting between sets) Other Exercises Other Exercises: IS and flutter valve 3 sets of 3 reps each with rest break inbetween.  Pt able to pull 600 mL max inpired volume on IS today with cues for a slower breath (he does fast and short to try to get power).     General Comments General comments (skin integrity, edema, etc.): After effort of tansfer to the chair and O2 sats dropping into the 60s, pt tool >12 mins to recover back to 80% with (+) belly breathing.         Pertinent Vitals/Pain Pain Assessment: Faces Faces Pain Scale: Hurts little more Pain Location: generalized Pain Descriptors / Indicators: Discomfort;Grimacing;Guarding Pain Intervention(s): Limited activity within patient's tolerance;Monitored during session;Repositioned           PT Goals (current goals can now be found in the care plan section) Acute Rehab PT Goals Patient Stated Goal: to not die and to do everything he can within his power to get stronger Progress towards PT goals: Not progressing toward goals - comment(needed more recovery time and lower resting sats today. )    Frequency    Min 3X/week      PT Plan Current plan remains appropriate       AM-PAC PT "6 Clicks" Mobility   Outcome Measure  Help needed turning from your back to your side while in a flat bed without using bedrails?: A Little Help needed moving from lying on your back to sitting on the side of a flat bed without using bedrails?: A Lot Help needed moving to and from a bed to a chair (including a wheelchair)?: A Lot Help needed standing up from a chair using your arms (e.g., wheelchair or bedside chair)?: A Lot Help needed to walk in hospital room?: Total Help needed climbing 3-5 steps with a railing? : Total 6 Click Score: 11    End of Session Equipment Utilized During Treatment: Oxygen;Gait belt Activity Tolerance: Patient limited by fatigue Patient left: in chair;with call bell/phone within reach Nurse Communication: Mobility status;Need for lift equipment PT Visit Diagnosis: Muscle weakness (generalized) (M62.81);Difficulty in walking, not elsewhere classified (R26.2)     Time: [id number] PT Time Calculation (min) (ACUTE ONLY): 61 min  Charges:  $Therapeutic Exercise: 23-37 mins $Therapeutic Activity: 23-37 mins                    Verdene Lennert, PT, DPT  Acute Rehabilitation [phone] pager #[phone] office  @ [facility]: [phone]   04/01/2019, 4:53  PM

## 2019-04-02 ENCOUNTER — Inpatient Hospital Stay (HOSPITAL_COMMUNITY): Payer: Medicare Other

## 2019-04-02 DIAGNOSIS — J690 Pneumonitis due to inhalation of food and vomit: Secondary | ICD-10-CM

## 2019-04-02 DIAGNOSIS — R1310 Dysphagia, unspecified: Secondary | ICD-10-CM

## 2019-04-02 LAB — CBC WITH DIFFERENTIAL/PLATELET
Abs Immature Granulocytes: 0.07 10*3/uL (ref 0.00–0.07)
Basophils Absolute: 0 10*3/uL (ref 0.0–0.1)
Basophils Relative: 0 %
Eosinophils Absolute: 0 10*3/uL (ref 0.0–0.5)
Eosinophils Relative: 0 %
HCT: 40.7 % (ref 39.0–52.0)
Hemoglobin: 12.9 g/dL — ABNORMAL LOW (ref 13.0–17.0)
Immature Granulocytes: 1 %
Lymphocytes Relative: 3 %
Lymphs Abs: 0.3 10*3/uL — ABNORMAL LOW (ref 0.7–4.0)
MCH: 30.9 pg (ref 26.0–34.0)
MCHC: 31.7 g/dL (ref 30.0–36.0)
MCV: 97.4 fL (ref 80.0–100.0)
Monocytes Absolute: 0.5 10*3/uL (ref 0.1–1.0)
Monocytes Relative: 5 %
Neutro Abs: 9.2 10*3/uL — ABNORMAL HIGH (ref 1.7–7.7)
Neutrophils Relative %: 91 %
Platelets: 190 10*3/uL (ref 150–400)
RBC: 4.18 MIL/uL — ABNORMAL LOW (ref 4.22–5.81)
RDW: 14.2 % (ref 11.5–15.5)
WBC: 10.1 10*3/uL (ref 4.0–10.5)
nRBC: 0.2 % (ref 0.0–0.2)

## 2019-04-02 LAB — GLUCOSE, CAPILLARY
Glucose-Capillary: 120 mg/dL — ABNORMAL HIGH (ref 70–99)
Glucose-Capillary: 110 mg/dL — ABNORMAL HIGH (ref 70–99)
Glucose-Capillary: 145 mg/dL — ABNORMAL HIGH (ref 70–99)

## 2019-04-02 LAB — COMPREHENSIVE METABOLIC PANEL
ALT: 35 U/L (ref 0–44)
AST: 22 U/L (ref 15–41)
Albumin: 2.8 g/dL — ABNORMAL LOW (ref 3.5–5.0)
Alkaline Phosphatase: 79 U/L (ref 38–126)
Anion gap: 12 (ref 5–15)
BUN: 32 mg/dL — ABNORMAL HIGH (ref 8–23)
CO2: 34 mmol/L — ABNORMAL HIGH (ref 22–32)
Calcium: 9.2 mg/dL (ref 8.9–10.3)
Chloride: 92 mmol/L — ABNORMAL LOW (ref 98–111)
Creatinine, Ser: 0.68 mg/dL (ref 0.61–1.24)
GFR calc Af Amer: >60 mL/min (ref >60)
GFR calc non Af Amer: >60 mL/min (ref >60)
Glucose, Bld: 104 mg/dL — ABNORMAL HIGH (ref 70–99)
Potassium: 4.2 mmol/L (ref 3.5–5.1)
Sodium: 138 mmol/L (ref 135–145)
Total Bilirubin: 0.8 mg/dL (ref 0.3–1.2)
Total Protein: 5.8 g/dL — ABNORMAL LOW (ref 6.5–8.1)

## 2019-04-02 LAB — LACTIC ACID, PLASMA
Lactic Acid, Venous: 1.5 mmol/L (ref 0.5–1.9)
Lactic Acid, Venous: 1.1 mmol/L (ref 0.5–1.9)

## 2019-04-02 LAB — D-DIMER, QUANTITATIVE: D-Dimer, Quant: 0.54 ug/mL-FEU — ABNORMAL HIGH (ref 0.00–0.50)

## 2019-04-02 LAB — C-REACTIVE PROTEIN: CRP: 20 mg/dL — ABNORMAL HIGH (ref <1.0)

## 2019-04-02 LAB — FERRITIN: Ferritin: 561 ng/mL — ABNORMAL HIGH (ref 24–336)

## 2019-04-02 LAB — MAGNESIUM: Magnesium: 2.4 mg/dL (ref 1.7–2.4)

## 2019-04-02 LAB — PROCALCITONIN: Procalcitonin: <0.1 ng/mL

## 2019-04-02 LAB — PHOSPHORUS: Phosphorus: 4.6 mg/dL (ref 2.5–4.6)

## 2019-04-02 MED ORDER — ALBUMIN HUMAN 25 % IV SOLN
50.0000 g | Freq: Once | INTRAVENOUS | Status: AC
  Administered 2019-04-02: 12.5 g via INTRAVENOUS
  Filled 2019-04-02: qty 50

## 2019-04-02 MED ORDER — FUROSEMIDE 10 MG/ML IJ SOLN
60.0000 mg | Freq: Once | INTRAMUSCULAR | Status: AC
  Administered 2019-04-02: 60 mg via INTRAVENOUS
  Filled 2019-04-02: qty 6

## 2019-04-02 MED ORDER — METHYLPREDNISOLONE SODIUM SUCC 40 MG IJ SOLR
40.0000 mg | Freq: Every day | INTRAMUSCULAR | Status: DC
  Administered 2019-04-02 – 2019-04-06 (×5): 40 mg via INTRAVENOUS
  Filled 2019-04-02 (×5): qty 1

## 2019-04-02 MED ORDER — PREDNISONE 20 MG PO TABS: 50.0000 mg | ORAL_TABLET | Freq: Every day | ORAL | Status: DC

## 2019-04-02 MED ORDER — SODIUM CHLORIDE 0.9 % IV SOLN
3.0000 g | Freq: Four times a day (QID) | INTRAVENOUS | Status: DC
  Administered 2019-04-02 – 2019-04-03 (×3): 3 g via INTRAVENOUS
  Filled 2019-04-02 (×3): qty 8

## 2019-04-02 NOTE — Progress Notes (Deleted)
SLP Cancellation Note  Patient Details Name: Justin Atkinson. MRN: [id number] DOB: [birth year]   Cancelled treatment:        SLP planned to see pt today for dysphagia and notified by PT that is has started coughing every time he swallows despite using right head turn, not present during prior session. Respiratory needs have increased from 15 L HFNC to needing heated HFNC to 40 L in addition to non rebreather.  Assisted to reposition on back, up in bed and head of bed raised. HFCN and non rebreather became tangled and HFNC out of nostrils for 5 seconds to detangle and sats fell briefly to 53%, slowly rising to low-mid 60's. RN arrived to monitor. Pt able to turn once again to side and O2 rising to high 70's- ST left and pt with nursing. Notified Dr. Sherral Hammers; recommend pt be NPO until when/if respiratory status improves. Will follow.   Houston Siren 04/02/2019, 1:27 PM

## 2019-04-02 NOTE — Progress Notes (Signed)
PROGRESS NOTE    Justin Atkinson.  [id number] DOB: [birth year] DOA: 03/01/2019 PCP: [name], MD   Brief Narrative:  78 year old  WM PMHx COPD/pulmonary fibrosis recently diagnosed hypersensitivity pneumonitis (had VATS wedge resection on 11/9;follows with Dr. Vaughan Browner) who was recently started on steroid on 11/18, HTN, diabetes type 2 uncontrolled with complication, HLD, and DJD   Presented with increasing shortness of breath with nasal congestion, cough for about 10 days. Also noticed that his oxygen dropped to 89% on room air. Called the pulmonary office who asked him to come to the ED. In the ED he is hypoxicat80%, tachypneic and mildly hypertensive. CT angiogram of the chest was negative for PE and showed bilateral diffuse groundglass opacity. Admitted to ICU on high flow started on IV Solu-Medrol. COVID-19 test returned as positive. In the ED he had elevated inflammatory markers including LDH, ferritin, CRP, lactic acid and D-dimer. Patient remains on high flow oxygen. 03/01/2019: Remains 100% high flow nasal cannula oxygen.  Transferring to [place] to continue treatment.  NOTE review of Dr. Vaughan Browner PCCM note 02/18/2019 patient was sent home on 2 L O2, pulmonary fibrosis, RIGHT VATS/11/9 performed showed hypersensitivity pneumonitis   Subjective: 12/31 last 24 hours afebrile negative CP, negative abdominal pain.  Increased WOB/S OB       Assessment & Plan:   Principal Problem:   Acute respiratory failure with hypoxia (HCC) Active Problems:   Hypertension   Hypercholesterolemia   Benign prostate hyperplasia   ILD (interstitial lung disease) (HCC)   Postinflammatory pulmonary fibrosis (HCC)   HCAP (healthcare-associated pneumonia)   Chronic obstructive pulmonary disease (HCC)   Pneumonia due to COVID-19 virus   Severe sepsis ([place])   Essential hypertension   Diabetes mellitus type 2, controlled, with complications ([place])   Pulmonary fibrosis  (HCC)   Hypersensitivity pneumonitis (HCC)   Hypokalemia   Physical deconditioning   Aspiration pneumonia of both lower lobes due to gastric secretions (HCC)   Dysphagia   Covid pneumonia/acute respiratory failure with hypoxia COVID-19 Labs  Recent Labs    03/31/19 0247 04/01/19 0135 04/02/19 0125  DDIMER 0.72* 0.68* 0.54*  FERRITIN 529* 488* 561*  CRP 4.6* 8.5* 20.0*    Lab Results  Component Value Date   SARSCOV2NAA POSITIVE (A) 02/26/2019   SARSCOV2NAA NOT DETECTED 02/05/2019   SARSCOV2NAA NOT DETECTED 01/09/2019   -12/14 increase Solu-Medrol 60 mg BID--> 12/24 prednisone steroid taper started -Remdesivir per pharmacy protocol -11/29 Actemra x1 dose -11/30 Covid Convalescent Plasma x1  -Albuterol PRN -Flutter valve -Incentive spirometry -Titrate O2 to maintain SPO2 88-93% -Prone patient 16 hours/day; if patient cannot tolerate prone at least 2 to 3 hours per shift -Morphine PRN air hunger have decreased dose -12/30 DC prednisone taper.  -12/31 Solu-Medrol 40 mg daily -See aspiration pneumonia  HCAP/severe sepsis -Trend procalcitonin Results for AVIR, WUNSCH (MRN [id number]) as of 03/01/2019 18:22  Ref. Range 02/26/2019 11:47 02/28/2019 07:57 03/01/2019 03:33  Procalcitonin Latest Units: ng/mL 0.13 <0.10 <0.10  -Completed cefepime -12/10 sputum; normal respiratory flora -12/29 PCXR shows worsening infiltrates; procalcitonin/lactic acid WNL  Aspiration pneumonia -Patient now n.p.o. -12/31 start Unasyn complete 7-day course -12/31 patient now on [place], secondary to decompensating today  Pulmonary edema -See hypertension  Pulmonary fibrosis/Hypersensitivity Pneumonitis -Per Dr. Vaughan Browner PCCM note 11/9 worsening pulmonary fibrosis s  -11/9 RIGHT VATS; per Dr. Vaughan Browner PCCM note showed hypersensitivity pneumonitis  -See Covid pneumonia  Hypertension -BP medication discontinued patient's BP has been running on the low side. -  In and out -13.5 L -Daily  weight -12/31 Albumin 50 g + Lasix 60 mg  HLD -Crestor 10 mg daily  Diabetes type 2 controlled with complication -123456 hemoglobin A1c= 7.0 -Sensitive SSI -NovoLog 6 units qac -12/30 Lantus 8 units daily  Anxiety/Depression -12/29 increase Xanax 0.5 mg BID PRN -12/29 Cymbalta 20 mg daily  Dysphagia -Patient had been on dysphagia diet, now n.p.o. secondary to aspiration  Insomnia -Patient on Seroquel 50 mg qhs   Daytime sleepiness -Resolved  Hypokalemia -Potassium goal> 4  Glaucoma -Per daughter [name] patient has some type of glaucoma may be in his chart under my chart   Deconditioning -All of patient's respiratory status slowly improving patient is becoming really deconditioned.    DVT prophylaxis: Lovenox  Code Status: Full Family Communication: 12/31 spoke with daughter and wife counseled him on plan of care answered all questions.  [name] his wife request that we contact [name] (daughter) as [place] [phone] Disposition Plan: TBD   Consultants:    Procedures/Significant Events:  11/30 Covid Convalescent Plasma x1  12/22 CT chest Wo contrast;-interval development of small pneumomediastinum. -Diffuse interstitial and airspace densities slightly progressed since the prior CT. Clinical correlation and follow-up to solution recommended. -Aortic Atherosclerosis and Emphysema 12/27 PCXR;-increased pulmonary interstitium with patchy opacities are identified throughout the left lung and in the right mid and lower lung worsened compared prior exam, -Superimposed pneumonia is not excluded.     I have personally reviewed and interpreted all radiology studies and my findings are as above.  VENTILATOR SETTINGS: HFNC + NRB 12/31 flow rate; 15 L/min FiO2;  SPO2; 81%    Cultures 11/27 SARS coronavirus positive 11/29 influenza A/B negative 12/10 sputum normal respiratory flora    Antimicrobials: Anti-infectives (From admission, onward)   Start      Dose/Rate Stop   03/01/19 0800  remdesivir 100 mg in sodium chloride 0.9 % 250 mL IVPB     100 mg 500 mL/hr over 30 Minutes 03/04/19 1216   02/28/19 1000  remdesivir 200 mg in sodium chloride 0.9 % 250 mL IVPB     200 mg 500 mL/hr over 30 Minutes 02/28/19 1006   02/28/19 0000  vancomycin (VANCOCIN) 1,250 mg in sodium chloride 0.9 % 250 mL IVPB  Status:  Discontinued     1,250 mg 166.7 mL/hr over 90 Minutes 02/28/2019 2037   02/01/2019 2200  ceFEPIme (MAXIPIME) 2 g in sodium chloride 0.9 % 100 mL IVPB  Status:  Discontinued     2 g 200 mL/hr over 30 Minutes 03/03/19 1242   03/02/2019 1315  ceFEPIme (MAXIPIME) 2 g in sodium chloride 0.9 % 100 mL IVPB     2 g 200 mL/hr over 30 Minutes 02/28/19 0324   02/04/2019 1315  vancomycin (VANCOCIN) 2,000 mg in sodium chloride 0.9 % 500 mL IVPB     2,000 mg 250 mL/hr over 120 Minutes 02/28/19 0324       Devices    LINES / TUBES:      Continuous Infusions: . ampicillin-sulbactam (UNASYN) IV       Objective: Vitals:   04/02/19 0751 04/02/19 0900 04/02/19 1154 04/02/19 1600  BP: (!) 121/59 (!) 134/54 (!) 95/56 121/63  Pulse: 86 89 76 80  Resp: (!) 24 (!) 27 (!) 27 (!) 26  Temp: 98 F (36.7 C)  (!) 97.4 F (36.3 C) 97.7 F (36.5 C)  TempSrc: Oral  Axillary Axillary  SpO2: (!) 81% (!) 82% (!) 86% (!) 86%  Weight:  Height:        Intake/Output Summary (Last 24 hours) at 04/02/2019 1915 Last data filed at 04/02/2019 1600 Gross per 24 hour  Intake --  Output 1450 ml  Net -1450 ml   Filed Weights   03/06/19 1607 03/23/19 0900 04/01/19 1213  Weight: 124.2 kg 118.2 kg 117.2 kg   Physical Exam:  General: A/O x4, increasing positive  acute respiratory distress Eyes: negative scleral hemorrhage, negative anisocoria, negative icterus ENT: Negative Runny nose, negative gingival bleeding, Neck:  Negative scars, masses, torticollis, lymphadenopathy, JVD Lungs: Tachypneic, decreased breath sounds bilaterally without wheezes or  crackles Cardiovascular: Tachycardic, without murmur gallop or rub normal S1 and S2 Abdomen: negative abdominal pain, nondistended, positive soft, bowel sounds, no rebound, no ascites, no appreciable mass Extremities: No significant cyanosis, clubbing, or edema bilateral lower extremities Skin: Negative rashes, lesions, ulcers Psychiatric:  Negative depression, negative anxiety, negative fatigue, negative mania  Central nervous system:  Cranial nerves II through XII intact, tongue/uvula midline, all extremities muscle strength 5/5, sensation intact throughout, negative dysarthria, negative expressive aphasia, negative receptive aphasia.     Data Reviewed: Care during the described time interval was provided by me .  I have reviewed this patient's available data, including medical history, events of note, physical examination, and all test results as part of my evaluation.   CBC: Recent Labs  Lab 03/29/19 0335 03/30/19 1150 03/31/19 0247 04/01/19 0135 04/02/19 0125  WBC 8.2 12.6* 9.9 9.7 10.1  NEUTROABS 6.2 11.2* 7.9* 8.0* 9.2*  HGB 14.1 14.8 13.6 12.3* 12.9*  HCT 44.3 45.1 42.3 38.4* 40.7  MCV 95.5 94.9 96.4 95.0 97.4  PLT 193 229 199 196 [id number]   Basic Metabolic Panel: Recent Labs  Lab 03/29/19 0335 03/30/19 1150 03/31/19 0247 04/01/19 0135 04/02/19 0125  NA 134* 137 139 138 138  K 4.8 3.4* 4.3 3.8 4.2  CL 92* 90* 94* 95* 92*  CO2 33* 34* 35* 34* 34*  GLUCOSE 108* 174* 106* 139* 104*  BUN 43* 38* 36* 33* 32*  CREATININE 1.00 0.96 0.95 0.83 0.68  CALCIUM 8.9 8.8* 8.9 8.8* 9.2  MG 2.6* 2.2 2.3 2.2 2.4  PHOS 2.7 2.7 3.4 3.6 4.6   GFR: Estimated Creatinine Clearance: 105.3 mL/min (by C-G formula based on SCr of 0.68 mg/dL). Liver Function Tests: Recent Labs  Lab 03/29/19 0335 03/30/19 1150 03/31/19 0247 04/01/19 0135 04/02/19 0125  AST 20 24 20 22 22   ALT 51* 51* 45* 38 35  ALKPHOS 57 63 62 64 79  BILITOT 0.8 0.9 1.0 1.0 0.8  PROT 5.8* 6.1* 5.7* 5.3* 5.8*   ALBUMIN 2.9* 3.2* 3.2* 2.7* 2.8*   No results for input(s): LIPASE, AMYLASE in the last 168 hours. No results for input(s): AMMONIA in the last 168 hours. Coagulation Profile: No results for input(s): INR, PROTIME in the last 168 hours. Cardiac Enzymes: No results for input(s): CKTOTAL, CKMB, CKMBINDEX, TROPONINI in the last 168 hours. BNP (last 3 results) No results for input(s): PROBNP in the last 8760 hours. HbA1C: No results for input(s): HGBA1C in the last 72 hours. CBG: Recent Labs  Lab 04/01/19 1556 04/01/19 2030 04/02/19 0731 04/02/19 1152 04/02/19 1623  GLUCAP 169* 138* 120* 110* 145*   Lipid Profile: No results for input(s): CHOL, HDL, LDLCALC, TRIG, CHOLHDL, LDLDIRECT in the last 72 hours. Thyroid Function Tests: No results for input(s): TSH, T4TOTAL, FREET4, T3FREE, THYROIDAB in the last 72 hours. Anemia Panel: Recent Labs    04/01/19 0135 04/02/19 0125  FERRITIN  488* 561*   Urine analysis:    Component Value Date/Time   COLORURINE YELLOW 02/16/2019 1915   APPEARANCEUR CLEAR 02/21/2019 1915   LABSPEC >1.046 (H) 02/24/2019 1915   PHURINE 6.0 02/20/2019 1915   GLUCOSEU NEGATIVE 02/26/2019 1915   HGBUR NEGATIVE 02/02/2019 [place] NEGATIVE 02/22/2019 [place] NEGATIVE 02/24/2019 [place] NEGATIVE 02/02/2019 1915   NITRITE NEGATIVE 02/26/2019 1915   LEUKOCYTESUR NEGATIVE 02/19/2019 1915   Sepsis Labs: @LABRCNTIP (procalcitonin:4,lacticidven:4)  ) No results found for this or any previous visit (from the past 240 hour(s)).       Radiology Studies: DG CHEST PORT 1 VIEW  Result Date: 04/02/2019 CLINICAL DATA:  Shortness of breath, history of COVID-19 positivity 1 month ago EXAM: PORTABLE CHEST 1 VIEW COMPARISON:  03/29/2019 FINDINGS: Cardiac shadow is stable. Increasing parenchymal opacity throughout both lungs is noted when compared with the prior exam. No sizable effusion is noted. No bony abnormality is seen. IMPRESSION:  Diffuse increased parenchymal opacities bilaterally likely related to a combination of pulmonary edema and possibly superimposed pneumonia. Electronically Signed   By: Inez Catalina M.D.   On: 04/02/2019 10:27        Scheduled Meds: . aspirin EC  81 mg Oral Daily  . DULoxetine  20 mg Oral Daily  . enoxaparin (LOVENOX) injection  60 mg Subcutaneous Q24H  . feeding supplement (ENSURE ENLIVE)  237 mL Oral BID BM  . fluticasone  2 spray Each Nare Daily  . guaiFENesin  1,200 mg Oral BID  . insulin aspart  0-9 Units Subcutaneous TID WC  . insulin aspart  6 Units Subcutaneous TID WC  . insulin glargine  8 Units Subcutaneous Daily  . latanoprost  1 drop Both Eyes QHS  . loratadine  10 mg Oral Daily  . mouth rinse  15 mL Mouth Rinse BID  . methylPREDNISolone (SOLU-MEDROL) injection  40 mg Intravenous Daily  . multivitamin with minerals  1 tablet Oral Daily  . omega-3 acid ethyl esters  1 g Oral Daily  . pantoprazole  40 mg Oral Daily  . QUEtiapine  50 mg Oral QHS  . rosuvastatin  10 mg Oral Daily  . timolol  1 drop Left Eye BID  . umeclidinium-vilanterol  1 puff Inhalation Daily   Continuous Infusions: . ampicillin-sulbactam (UNASYN) IV       LOS: 34 days   The patient is critically ill with multiple organ systems failure and requires high complexity decision making for assessment and support, frequent evaluation and titration of therapies, application of advanced monitoring technologies and extensive interpretation of multiple databases. Critical Care Time devoted to patient care services described in this note  Time spent: 40 minutes     Alexus Michael, Geraldo Docker, MD [facility] Pager [phone]  If 7PM-7AM, please contact night-coverage [email or web address] Password [email or web address] 04/02/2019, 7:15 PM

## 2019-04-02 NOTE — Progress Notes (Signed)
  Speech Language Pathology Treatment: Dysphagia  Patient Details Name: Justin Atkinson. MRN: [id number] DOB: Oct 10, 1940 Today's Date: 04/02/2019 Time: [id number] SLP Time Calculation (min) (ACUTE ONLY): 15 min  Assessment / Plan / Recommendation Clinical Impression  SLP planned to observe with po's and notified by PT that is has started coughing every time he swallows despite using right head turn, not present during prior session. Respiratory needs have increased from 15 L HFNC to needing heated HFNC to 40 L in addition to non rebreather.  Assisted to reposition on back, up in bed and head of bed raised. HFCN and non rebreather became tangled and HFNC out of nostrils for 5 seconds to detangle and sats fell briefly to 53%, slowly rising to low-mid 60's. RN arrived to monitor. Pt able to turn once again to side and O2 rising to high 70's- ST left and pt with nursing. Notified Dr. Sherral Hammers; recommend pt be NPO until when/if respiratory status improves. Will follow.   HPI HPI: Pt is a 78 y.o. male recently admitted s/p VATS with lobe wedge resection (02/09/2019), now admitted 02/15/2019 with worsening cough and SOB; tested (+) COVID-19. [name] oxygen needs were not improving and critical care requested MBS. Other PMH includes COPD/pulmonary fibrosis, cervical DDD, HTN, DM, HLD.      SLP Plan  Continue with current plan of care       Recommendations  Diet recommendations: NPO Medication Administration: Via alternative means                Oral Care Recommendations: Oral care QID Follow up Recommendations: None SLP Visit Diagnosis: Dysphagia, pharyngeal phase (R13.13) Plan: Continue with current plan of care       GO                Houston Siren 04/02/2019, 1:46 PM

## 2019-04-02 NOTE — Progress Notes (Signed)
Placed patient on heated high flow [place] at 40l/m and 100%.  Patient also wearing NRB mask.  Patient tolerated well.  Will continue to monitor.

## 2019-04-02 NOTE — Progress Notes (Signed)
PT Cancellation Note  Patient Details Name: Justin Atkinson. MRN: [id number] DOB: [birth year]   Cancelled Treatment:    Reason Eval/Treat Not Completed: Patient not medically ready.  Per chart and per pt report he had a "rough night" reporting he could barely get his O2 out of the 60s on max HFNC and max NRB mask.  He was switched to heated high flow this AM and sats are in the 80s.  Pt exhausted as he did not sleep last night.  He feels like he has no strength left to get up this AM.  He is agreeable for me to check back this afternoon to see if he feels better after some rest.  Otherwise, PT to check on tomorrow.  Thanks,  Verdene Lennert, PT, DPT  Acute Rehabilitation [phone] pager [phone] office  @ [facility]: [phone]     Harvie Heck 04/02/2019, 11:45 AM

## 2019-04-02 NOTE — Progress Notes (Signed)
Chaplain follow up with pt.  Justin Atkinson is welcoming of chaplain presence, expresses gratefulness for chaplain contacting family.  Reports having difficult evening with low sats.  Presents as lethargic and is less talkative today.  He is welcoming of prayers at bedside.   He requests chaplain contact spouse, [name].    This chaplain will contact [name] for support.    Jerene Pitch, MDiv, [facility]  Lead Clinical Chaplain,  [place]

## 2019-04-02 NOTE — Progress Notes (Signed)
Pt had a rough night this shift as he struggled to sleep comfortable with continued desaturations In oxygen. Writer assisted pt in repositioning and breathing techniques to assist with sob. Pt fully aware of his limits and remains calm as much as possible. Writer changed out resp delivery mask.  Pt tolerated medication. Denies pain this shift. Messaged MD earlier in shift to advise about pt saturations staying in mid 70's., did not receive any correspondence. Pt stable at this time. Will cont to monitor.

## 2019-04-02 NOTE — Progress Notes (Signed)
PT Cancellation Note  Patient Details Name: Gregori Okelley. MRN: [id number] DOB: 1940/08/14   Cancelled Treatment:     I checked in with Mr. Deboy as I promised him I would this AM.  He remains not ready for mobilization. He is on heated high flow with sats in the low 80s and NRB mask.  He is laying on his left side in the bed with visible belly breathing.  He has still not rested since this AM.  He requested I call his wife Deloris Ping and stated that his daughter Anderson Malta would be there also.  I called and spoke with his wife and daughter about the week and the downward progression despite daily therapy.  I answered their questions to the best of my ability and they said they would call the RN, Amy, to speak with him over speaker phone later this evening.  Therapy will check in on Mr Wilensky tomorrow to see if he is at all stable to be mobilized, otherwise we will follow up on Monday, January 4th.   I am not thinking that his downward progression this week is a good sign for his recovery or mortality.   Thanks,  Verdene Lennert, PT, DPT  Acute Rehabilitation [phone] pager [phone] office  @ [facility]: [phone]    Harvie Heck 04/02/2019, 5:56 PM

## 2019-04-02 NOTE — Progress Notes (Signed)
Pharmacy Antibiotic Note  Justin Dolton. is a 78 y.o. male admitted on 02/15/2019 with +COVID-19.  Pharmacy has been consulted for Unasyn dosing for aspiration pneumonia.  Plan: Unasyn 3g IV q6h Follow up renal function & cultures  Height: 6\' 1"  (185.4 cm) Weight: 258 lb 7 oz (117.2 kg) IBW/kg (Calculated) : 79.9  Temp (24hrs), Avg:97.7 F (36.5 C), Min:97.4 F (36.3 C), Max:98 F (36.7 C)  Recent Labs  Lab 03/29/19 0335 03/30/19 1150 03/31/19 0247 03/31/19 1704 03/31/19 1908 04/01/19 0135 04/02/19 0125 04/02/19 1023 04/02/19 1310  WBC 8.2 12.6* 9.9  --   --  9.7 10.1  --   --   CREATININE 1.00 0.96 0.95  --   --  0.83 0.68  --   --   LATICACIDVEN  --   --   --  1.3 1.6  --   --  1.5 1.1    Estimated Creatinine Clearance: 105.3 mL/min (by C-G formula based on SCr of 0.68 mg/dL).    No Known Allergies  Antimicrobials this admission: 11/27 Vanc x 1 dose 11/27 cefepime>> 12/1 11/28 Remdesivir >>12/2 11/29 Actemra  12/31 Unasyn >>  Microbiology results: 11/27 BCx2: NGTD 11/27 UCx: NGF 11/27 MRSA PCR: negative 11/27 Covid + 11/27 HIV: non-reactive 11/27 rep panel:negative 11/27 Sputum: not acceptable for testing 11/27 strep pneumo:negative 11/27 legionella: negative 12/10 resp cx>> normal flora   Thank you for allowing pharmacy to be a part of this patient's care.  Peggyann Juba, PharmD, BCPS Pharmacy: [phone] 04/02/2019 7:09 PM

## 2019-04-03 ENCOUNTER — Other Ambulatory Visit: Payer: Self-pay

## 2019-04-03 DIAGNOSIS — J1282 Pneumonia due to coronavirus disease 2019: Secondary | ICD-10-CM

## 2019-04-03 DIAGNOSIS — I4891 Unspecified atrial fibrillation: Secondary | ICD-10-CM | POA: Diagnosis present

## 2019-04-03 LAB — CBC WITH DIFFERENTIAL/PLATELET
Abs Immature Granulocytes: 0.04 10*3/uL (ref 0.00–0.07)
Basophils Absolute: 0 10*3/uL (ref 0.0–0.1)
Basophils Relative: 0 %
Eosinophils Absolute: 0 10*3/uL (ref 0.0–0.5)
Eosinophils Relative: 0 %
HCT: 43.2 % (ref 39.0–52.0)
Hemoglobin: 13.6 g/dL (ref 13.0–17.0)
Immature Granulocytes: 0 %
Lymphocytes Relative: 3 %
Lymphs Abs: 0.3 10*3/uL — ABNORMAL LOW (ref 0.7–4.0)
MCH: 30.4 pg (ref 26.0–34.0)
MCHC: 31.5 g/dL (ref 30.0–36.0)
MCV: 96.4 fL (ref 80.0–100.0)
Monocytes Absolute: 0.6 10*3/uL (ref 0.1–1.0)
Monocytes Relative: 5 %
Neutro Abs: 9.5 10*3/uL — ABNORMAL HIGH (ref 1.7–7.7)
Neutrophils Relative %: 92 %
Platelets: 215 10*3/uL (ref 150–400)
RBC: 4.48 MIL/uL (ref 4.22–5.81)
RDW: 14.5 % (ref 11.5–15.5)
WBC: 10.4 10*3/uL (ref 4.0–10.5)
nRBC: 0 % (ref 0.0–0.2)

## 2019-04-03 LAB — COMPREHENSIVE METABOLIC PANEL
ALT: 34 U/L (ref 0–44)
AST: 18 U/L (ref 15–41)
Albumin: 2.8 g/dL — ABNORMAL LOW (ref 3.5–5.0)
Alkaline Phosphatase: 86 U/L (ref 38–126)
Anion gap: 16 — ABNORMAL HIGH (ref 5–15)
BUN: 36 mg/dL — ABNORMAL HIGH (ref 8–23)
CO2: 33 mmol/L — ABNORMAL HIGH (ref 22–32)
Calcium: 9.2 mg/dL (ref 8.9–10.3)
Chloride: 94 mmol/L — ABNORMAL LOW (ref 98–111)
Creatinine, Ser: 0.85 mg/dL (ref 0.61–1.24)
GFR calc Af Amer: >60 mL/min (ref >60)
GFR calc non Af Amer: >60 mL/min (ref >60)
Glucose, Bld: 121 mg/dL — ABNORMAL HIGH (ref 70–99)
Potassium: 3.5 mmol/L (ref 3.5–5.1)
Sodium: 143 mmol/L (ref 135–145)
Total Bilirubin: 1.1 mg/dL (ref 0.3–1.2)
Total Protein: 6.1 g/dL — ABNORMAL LOW (ref 6.5–8.1)

## 2019-04-03 LAB — D-DIMER, QUANTITATIVE: D-Dimer, Quant: 1.43 ug/mL-FEU — ABNORMAL HIGH (ref 0.00–0.50)

## 2019-04-03 LAB — MAGNESIUM: Magnesium: 2.4 mg/dL (ref 1.7–2.4)

## 2019-04-03 LAB — C-REACTIVE PROTEIN: CRP: 13.3 mg/dL — ABNORMAL HIGH (ref <1.0)

## 2019-04-03 LAB — GLUCOSE, CAPILLARY
Glucose-Capillary: 129 mg/dL — ABNORMAL HIGH (ref 70–99)
Glucose-Capillary: 110 mg/dL — ABNORMAL HIGH (ref 70–99)
Glucose-Capillary: 174 mg/dL — ABNORMAL HIGH (ref 70–99)

## 2019-04-03 LAB — PHOSPHORUS: Phosphorus: 4.9 mg/dL — ABNORMAL HIGH (ref 2.5–4.6)

## 2019-04-03 LAB — FERRITIN: Ferritin: 625 ng/mL — ABNORMAL HIGH (ref 24–336)

## 2019-04-03 LAB — PROCALCITONIN: Procalcitonin: <0.1 ng/mL

## 2019-04-03 MED ORDER — METOPROLOL TARTRATE 5 MG/5ML IV SOLN
5.0000 mg | Freq: Once | INTRAVENOUS | Status: AC
  Administered 2019-04-03: 5 mg via INTRAVENOUS
  Filled 2019-04-03: qty 5

## 2019-04-03 MED ORDER — DILTIAZEM HCL-DEXTROSE 125-5 MG/125ML-% IV SOLN (PREMIX)
5.0000 mg/h | INTRAVENOUS | Status: DC
  Administered 2019-04-03: 5 mg/h via INTRAVENOUS
  Filled 2019-04-03 (×2): qty 125

## 2019-04-03 MED ORDER — SODIUM CHLORIDE 0.9 % IV SOLN
500.0000 mg | INTRAVENOUS | Status: DC
  Administered 2019-04-03 – 2019-04-06 (×4): 500 mg via INTRAVENOUS
  Filled 2019-04-03 (×4): qty 500

## 2019-04-03 MED ORDER — FUROSEMIDE 10 MG/ML IJ SOLN
80.0000 mg | Freq: Three times a day (TID) | INTRAMUSCULAR | Status: DC
  Administered 2019-04-03 – 2019-04-06 (×10): 80 mg via INTRAVENOUS
  Filled 2019-04-03 (×10): qty 8

## 2019-04-03 MED ORDER — SODIUM CHLORIDE 0.9 % IV SOLN
2.0000 g | Freq: Three times a day (TID) | INTRAVENOUS | Status: DC
  Administered 2019-04-03 – 2019-04-05 (×7): 2 g via INTRAVENOUS
  Filled 2019-04-03 (×7): qty 2

## 2019-04-03 MED ORDER — POTASSIUM CHLORIDE 10 MEQ/100ML IV SOLN
10.0000 meq | INTRAVENOUS | Status: AC
  Administered 2019-04-03 (×4): 10 meq via INTRAVENOUS
  Filled 2019-04-03 (×5): qty 100

## 2019-04-03 NOTE — Progress Notes (Signed)
RN to bedside as pt HR in 40s on monitor. Pt found to be unresponsive with agonal breathing; pulse oximeter, non-rebreather mask, and heated high flow nasal cannula self-removed by patient. Primary RN initiated RRT and notified CN. Oxygen mask and cannula reapplied with O2 saturations in 40s, HR 40s SB, carotid pulse palpable. Pts O2 saturations recovered to 70s and LOC improved to baseline, alert and following commands. Pt intermittently in afib RVR on monitor, stable BP. Provider notified of hypoxic event and cardiovascular/neurological changes. No new orders at this time.

## 2019-04-03 NOTE — Progress Notes (Signed)
I spoke with Justin Atkinson who is the pt's daughter and she said that she is coming with her mom and her sister to come visit the pt. She has called me back with a time of arrival as of now, we have set up family meeting time to be around 7:30pm tonight.  Pt is doing well laying on his side sats are 82-84% since he got off his back. Will continue to monitor.

## 2019-04-03 NOTE — Progress Notes (Signed)
Spoke with provider about pt's HR going back and forth between 80s to 140s. he has ordered cardizem drip. Also provider was contact regarding pt's O2 sats trending down to the 70s. Pt asked for bedpan around 1130 and since then his sats has been low and he hasn't recovered from it. Provider  recommended O2 level to be increased to 60L but I had to bring it back down to 50 L because that is the maximum we are allowed to administer to prevent complications. Provider spoke with pt on the phone to encourage him to prone. Pt said he would do it but he is not ready yet.

## 2019-04-03 NOTE — Progress Notes (Signed)
Pt is on Heated HFNC 50L 100% and NRB when he took his mask off to take his pills it was for less than 10second his O2 sat dropped to the 60s. He was able to come back up but it took some times and he is also in Afib with HR to the 130s to 140s he is not sustaining. According to night shift, he was doing the same in the morning and provider is aware of the Afib with RVR. Pt is currently alert and oriented x4. Will continue to monitor

## 2019-04-03 NOTE — Progress Notes (Signed)
PT Cancellation Note  Patient Details Name: Justin Atkinson. MRN: [id number] DOB: February 05, 1941   Cancelled Treatment:    Reason Eval/Treat Not Completed: Patient not medically ready. Attempted to see pt this am but pt not appropriate as he is desating to 60s w/ taking medication. Will attempt to f/u in pm to see if has had any improvement and is able to participate in tx.  Horald Chestnut, PT    Delford Field 04/03/2019, 12:35 PM

## 2019-04-03 NOTE — Progress Notes (Signed)
Chaplain provided brief support via phone with pt's spouse, [name], and daughter, Anderson Malta, at pt request.   Will contact pt's priest, Father Olga Millers, at [employer].

## 2019-04-03 NOTE — Progress Notes (Signed)
Provider has been contacted due to pt sating on the 5s. Provider called and we discuss speaking to the family and letting them know about the situation and having them come and visit the pt. Provider spoke with the pt again about poning or at least laying on his side. Pt said that he would do it and that he was something for pain. Pt got a call from the daughter and he agrees that he would lay on his side to help his O2 sats. He received 1 mg morphine after laying on his side. Pt looks a lot more comfortable right now.

## 2019-04-03 NOTE — Progress Notes (Addendum)
PROGRESS NOTE    Justin Atkinson.  [id number] DOB: 1940/09/23 DOA: 02/05/2019 PCP: [name], MD   Brief Narrative:  79 year old  WM PMHx COPD/pulmonary fibrosis recently diagnosed hypersensitivity pneumonitis (had VATS wedge resection on 11/9;follows with Dr. Vaughan Browner) who was recently started on steroid on 11/18, HTN, diabetes type 2 uncontrolled with complication, HLD, and DJD   Presented with increasing shortness of breath with nasal congestion, cough for about 10 days. Also noticed that his oxygen dropped to 89% on room air. Called the pulmonary office who asked him to come to the ED. In the ED he is hypoxicat80%, tachypneic and mildly hypertensive. CT angiogram of the chest was negative for PE and showed bilateral diffuse groundglass opacity. Admitted to ICU on high flow started on IV Solu-Medrol. COVID-19 test returned as positive. In the ED he had elevated inflammatory markers including LDH, ferritin, CRP, lactic acid and D-dimer. Patient remains on high flow oxygen. 03/01/2019: Remains 100% high flow nasal cannula oxygen.  Transferring to [facility] to continue treatment.  NOTE review of Dr. Vaughan Browner PCCM note 02/18/2019 patient was sent home on 2 L O2, pulmonary fibrosis, RIGHT VATS/11/9 performed showed hypersensitivity pneumonitis   Subjective: 1/1 last 24 hours afebrile A/O x4, states feels WOB improved.  Positive S OB negative abdominal pain, negative CP   Assessment & Plan:   Principal Problem:   Acute respiratory failure with hypoxia (HCC) Active Problems:   Hypertension   Hypercholesterolemia   Benign prostate hyperplasia   ILD (interstitial lung disease) (HCC)   Postinflammatory pulmonary fibrosis (HCC)   HCAP (healthcare-associated pneumonia)   Chronic obstructive pulmonary disease (HCC)   Pneumonia due to COVID-19 virus   Severe sepsis (HCC)   Essential hypertension   Diabetes mellitus type 2, controlled, with complications (HCC)  Pulmonary fibrosis (HCC)   Hypersensitivity pneumonitis (HCC)   Hypokalemia   Physical deconditioning   Aspiration pneumonia of both lower lobes due to gastric secretions (HCC)   Dysphagia   Atrial fibrillation with RVR (HCC)   Covid pneumonia/acute respiratory failure with hypoxia COVID-19 Labs  Recent Labs    04/01/19 0135 04/02/19 0125 04/03/19 0912  DDIMER 0.68* 0.54* 1.43*  FERRITIN 488* 561* 625*  CRP 8.5* 20.0* 13.3*    Lab Results  Component Value Date   SARSCOV2NAA POSITIVE (A) 02/07/2019   SARSCOV2NAA NOT DETECTED 02/05/2019   SARSCOV2NAA NOT DETECTED 01/09/2019   -12/14 increase Solu-Medrol 60 mg BID--> 12/24 prednisone steroid taper started -Remdesivir per pharmacy protocol -11/29 Actemra x1 dose -11/30 Covid Convalescent Plasma x1  -Albuterol PRN -Flutter valve -Incentive spirometry -Titrate O2 to maintain SPO2 88-93% -Prone patient 16 hours/day; if patient cannot tolerate prone at least 2 to 3 hours per shift -Morphine PRN air hunger have decreased dose -12/30 DC prednisone taper.  -12/31 Solu-Medrol 40 mg daily -See aspiration pneumonia  HCAP/severe sepsis -Trend procalcitonin Results for ADHAV, CROXFORD (MRN [id number]) as of 03/01/2019 18:22  Ref. Range 02/09/2019 11:47 02/28/2019 07:57 03/01/2019 03:33  Procalcitonin Latest Units: ng/mL 0.13 <0.10 <0.10  -Completed cefepime -12/10 sputum; normal respiratory flora -12/29 PCXR shows worsening infiltrates; procalcitonin/lactic acid WNL  Aspiration pneumonia -Patient now n.p.o. -12/31 start Unasyn complete 7-day course -12/31 patient now on [place], secondary to decompensating today -1/1 broaden patient's antibiotic coverage to HCAP  Pulmonary edema -See hypertension  Pulmonary fibrosis/Hypersensitivity Pneumonitis -Per Dr. Vaughan Browner PCCM note 11/9 worsening pulmonary fibrosis s  -11/9 RIGHT VATS; per Dr. Vaughan Browner PCCM note showed hypersensitivity pneumonitis  -See Covid pneumonia  A. fib  with RVR -1/1 patient has been well controlled but now rate increasing. -1/1 Cardizem drip   Hypertension -BP medication discontinued patient's BP has been running on the low side. -In and out -12.2 L -Daily weight Filed Weights   03/06/19 1607 03/23/19 0900 04/01/19 1213  Weight: 124.2 kg 118.2 kg 117.2 kg  -12/31 Albumin 50 g + Lasix 60 mg -1/1 Lasix 80 mg TID  HLD -Crestor 10 mg daily  Diabetes type 2 controlled with complication -123456 hemoglobin A1c= 7.0 -Sensitive SSI -NovoLog 6 units qac -12/30 Lantus 8 units daily  Anxiety/Depression -12/29 increase Xanax 0.5 mg BID PRN -12/29 Cymbalta 20 mg daily  Dysphagia -Patient had been on dysphagia diet, now n.p.o. secondary to aspiration  Insomnia -Patient on Seroquel 50 mg qhs   Daytime sleepiness -Resolved  Hypokalemia -Potassium goal> 4 -Potassium IV 60 mEq  Glaucoma -Per daughter [name] patient has some type of glaucoma may be in his chart under my chart   Deconditioning -All of patient's respiratory status slowly improving patient is becoming really deconditioned.    DVT prophylaxis: Lovenox  Code Status: Full Family Communication: 04/03/2019 spoke with daughter counseled her that patient has basically given up, even though he states he has not.  Will not follow commands to lay proned, even though he states he understands that his O2 level is continuing to drop.  Counseled [name] that probably will not live out the night given his deteriorating respiratory status and the fact that he has apparently given up on improving.  Plan is for [name] and mother to drive up this evening while patient is still competent and have a last visit.  [name] (daughter) as [place] [phone]   Disposition Plan: TBD   Consultants:    Procedures/Significant Events:  11/30 Covid Convalescent Plasma x1  12/22 CT chest Wo contrast;-interval development of small pneumomediastinum. -Diffuse interstitial and airspace densities  slightly progressed since the prior CT. Clinical correlation and follow-up to solution recommended. -Aortic Atherosclerosis and Emphysema 12/27 PCXR;-increased pulmonary interstitium with patchy opacities are identified throughout the left lung and in the right mid and lower lung worsened compared prior exam, -Superimposed pneumonia is not excluded. 12/31 PCXR;-diffuse increased parenchymal opacities bilaterally likely related to a combination of pulmonary edema and possibly superimposed pneumonia    I have personally reviewed and interpreted all radiology studies and my findings are as above.  VENTILATOR SETTINGS: HHFNC 1/1 flow rate; 15 L/min FiO2; 100% SPO2; 85%    Cultures 11/27 SARS coronavirus positive 11/29 influenza A/B negative 12/10 sputum normal respiratory flora    Antimicrobials: Anti-infectives (From admission, onward)   Start     Dose/Rate Stop   03/01/19 0800  remdesivir 100 mg in sodium chloride 0.9 % 250 mL IVPB     100 mg 500 mL/hr over 30 Minutes 03/04/19 1216   02/28/19 1000  remdesivir 200 mg in sodium chloride 0.9 % 250 mL IVPB     200 mg 500 mL/hr over 30 Minutes 02/28/19 1006   02/28/19 0000  vancomycin (VANCOCIN) 1,250 mg in sodium chloride 0.9 % 250 mL IVPB  Status:  Discontinued     1,250 mg 166.7 mL/hr over 90 Minutes 02/19/2019 2037   03/01/2019 2200  ceFEPIme (MAXIPIME) 2 g in sodium chloride 0.9 % 100 mL IVPB  Status:  Discontinued     2 g 200 mL/hr over 30 Minutes 03/03/19 1242   02/28/2019 1315  ceFEPIme (MAXIPIME) 2 g in sodium chloride 0.9 % 100 mL IVPB  2 g 200 mL/hr over 30 Minutes 02/28/19 0324   02/28/2019 1315  vancomycin (VANCOCIN) 2,000 mg in sodium chloride 0.9 % 500 mL IVPB     2,000 mg 250 mL/hr over 120 Minutes 02/28/19 0324       Devices    LINES / TUBES:      Continuous Infusions: . azithromycin 500 mg (04/03/19 1019)  . ceFEPime (MAXIPIME) IV 2 g (04/03/19 1130)  . diltiazem (CARDIZEM) infusion    . potassium  chloride       Objective: Vitals:   04/03/19 1000 04/03/19 1100 04/03/19 1200 04/03/19 1239  BP: (!) 143/88 123/69 (!) 139/111 (!) 152/100  Pulse: 94 93 (!) 131 (!) 103  Resp: (!) 26 (!) 30 (!) 22 (!) 29  Temp:   97.8 F (36.6 C)   TempSrc:   Axillary   SpO2: (!) 84% (!) 80% (!) 70% (!) 72%  Weight:      Height:        Intake/Output Summary (Last 24 hours) at 04/03/2019 1249 Last data filed at 04/03/2019 1227 Gross per 24 hour  Intake 625.37 ml  Output 1925 ml  Net -1299.63 ml   Filed Weights   03/06/19 1607 03/23/19 0900 04/01/19 1213  Weight: 124.2 kg 118.2 kg 117.2 kg   Physical Exam:  General: A/O x4, positive acute respiratory distress Eyes: negative scleral hemorrhage, negative anisocoria, negative icterus ENT: Negative Runny nose, negative gingival bleeding, Neck:  Negative scars, masses, torticollis, lymphadenopathy, JVD Lungs: Decreased breath sounds bilaterally without wheezes or crackles Cardiovascular: Irregularly irregular rhythm and rate without murmur gallop or rub normal S1 and S2 Abdomen: OBESE negative abdominal pain, nondistended, positive soft, bowel sounds, no rebound, no ascites, no appreciable mass Extremities: No significant cyanosis, clubbing, or edema bilateral lower extremities Skin: Negative rashes, lesions, ulcers Psychiatric:  Negative depression, negative anxiety, negative fatigue, negative mania  Central nervous system:  Cranial nerves II through XII intact, tongue/uvula midline, all extremities muscle strength 5/5, sensation intact throughout, negative dysarthria, negative expressive aphasia, negative receptive aphasia.      Data Reviewed: Care during the described time interval was provided by me .  I have reviewed this patient's available data, including medical history, events of note, physical examination, and all test results as part of my evaluation.   CBC: Recent Labs  Lab 03/30/19 1150 03/31/19 0247 04/01/19 0135  04/02/19 0125 04/03/19 0912  WBC 12.6* 9.9 9.7 10.1 10.4  NEUTROABS 11.2* 7.9* 8.0* 9.2* 9.5*  HGB 14.8 13.6 12.3* 12.9* 13.6  HCT 45.1 42.3 38.4* 40.7 43.2  MCV 94.9 96.4 95.0 97.4 96.4  PLT 229 199 196 190 [id number]   Basic Metabolic Panel: Recent Labs  Lab 03/30/19 1150 03/31/19 0247 04/01/19 0135 04/02/19 0125 04/03/19 0912  NA 137 139 138 138 143  K 3.4* 4.3 3.8 4.2 3.5  CL 90* 94* 95* 92* 94*  CO2 34* 35* 34* 34* 33*  GLUCOSE 174* 106* 139* 104* 121*  BUN 38* 36* 33* 32* 36*  CREATININE 0.96 0.95 0.83 0.68 0.85  CALCIUM 8.8* 8.9 8.8* 9.2 9.2  MG 2.2 2.3 2.2 2.4 2.4  PHOS 2.7 3.4 3.6 4.6 4.9*   GFR: Estimated Creatinine Clearance: 99.1 mL/min (by C-G formula based on SCr of 0.85 mg/dL). Liver Function Tests: Recent Labs  Lab 03/30/19 1150 03/31/19 0247 04/01/19 0135 04/02/19 0125 04/03/19 0912  AST 24 20 22 22 18   ALT 51* 45* 38 35 34  ALKPHOS 63 62 64 79 86  BILITOT  0.9 1.0 1.0 0.8 1.1  PROT 6.1* 5.7* 5.3* 5.8* 6.1*  ALBUMIN 3.2* 3.2* 2.7* 2.8* 2.8*   No results for input(s): LIPASE, AMYLASE in the last 168 hours. No results for input(s): AMMONIA in the last 168 hours. Coagulation Profile: No results for input(s): INR, PROTIME in the last 168 hours. Cardiac Enzymes: No results for input(s): CKTOTAL, CKMB, CKMBINDEX, TROPONINI in the last 168 hours. BNP (last 3 results) No results for input(s): PROBNP in the last 8760 hours. HbA1C: No results for input(s): HGBA1C in the last 72 hours. CBG: Recent Labs  Lab 04/01/19 2030 04/02/19 0731 04/02/19 1152 04/02/19 1623 04/03/19 0706  GLUCAP 138* 120* 110* 145* 129*   Lipid Profile: No results for input(s): CHOL, HDL, LDLCALC, TRIG, CHOLHDL, LDLDIRECT in the last 72 hours. Thyroid Function Tests: No results for input(s): TSH, T4TOTAL, FREET4, T3FREE, THYROIDAB in the last 72 hours. Anemia Panel: Recent Labs    04/02/19 0125 04/03/19 0912  FERRITIN 561* 625*   Urine analysis:    Component Value  Date/Time   COLORURINE YELLOW 03/02/2019 1915   APPEARANCEUR CLEAR 02/08/2019 1915   LABSPEC >1.046 (H) 02/26/2019 1915   PHURINE 6.0 02/13/2019 1915   GLUCOSEU NEGATIVE 02/28/2019 1915   HGBUR NEGATIVE 02/02/2019 1915   [place] NEGATIVE 02/11/2019 [place] 02/14/2019 1915   PROTEINUR NEGATIVE 02/03/2019 1915   NITRITE NEGATIVE 02/20/2019 1915   LEUKOCYTESUR NEGATIVE 02/15/2019 1915   Sepsis Labs: @LABRCNTIP (procalcitonin:4,lacticidven:4)  ) No results found for this or any previous visit (from the past 240 hour(s)).       Radiology Studies: DG CHEST PORT 1 VIEW  Result Date: 04/02/2019 CLINICAL DATA:  Shortness of breath, history of COVID-19 positivity 1 month ago EXAM: PORTABLE CHEST 1 VIEW COMPARISON:  03/29/2019 FINDINGS: Cardiac shadow is stable. Increasing parenchymal opacity throughout both lungs is noted when compared with the prior exam. No sizable effusion is noted. No bony abnormality is seen. IMPRESSION: Diffuse increased parenchymal opacities bilaterally likely related to a combination of pulmonary edema and possibly superimposed pneumonia. Electronically Signed   By: Inez Catalina M.D.   On: 04/02/2019 10:27        Scheduled Meds: . aspirin EC  81 mg Oral Daily  . DULoxetine  20 mg Oral Daily  . enoxaparin (LOVENOX) injection  60 mg Subcutaneous Q24H  . feeding supplement (ENSURE ENLIVE)  237 mL Oral BID BM  . fluticasone  2 spray Each Nare Daily  . furosemide  80 mg Intravenous TID AC  . guaiFENesin  1,200 mg Oral BID  . insulin aspart  0-9 Units Subcutaneous TID WC  . insulin aspart  6 Units Subcutaneous TID WC  . insulin glargine  8 Units Subcutaneous Daily  . latanoprost  1 drop Both Eyes QHS  . loratadine  10 mg Oral Daily  . mouth rinse  15 mL Mouth Rinse BID  . methylPREDNISolone (SOLU-MEDROL) injection  40 mg Intravenous Daily  . multivitamin with minerals  1 tablet Oral Daily  . omega-3 acid ethyl esters  1 g Oral Daily  .  pantoprazole  40 mg Oral Daily  . QUEtiapine  50 mg Oral QHS  . rosuvastatin  10 mg Oral Daily  . timolol  1 drop Left Eye BID  . umeclidinium-vilanterol  1 puff Inhalation Daily   Continuous Infusions: . azithromycin 500 mg (04/03/19 1019)  . ceFEPime (MAXIPIME) IV 2 g (04/03/19 1130)  . diltiazem (CARDIZEM) infusion    . potassium chloride  LOS: 35 days   The patient is critically ill with multiple organ systems failure and requires high complexity decision making for assessment and support, frequent evaluation and titration of therapies, application of advanced monitoring technologies and extensive interpretation of multiple databases. Critical Care Time devoted to patient care services described in this note  Time spent: 40 minutes     Kamsiyochukwu Buist, Geraldo Docker, MD [facility] Pager [phone]  If 7PM-7AM, please contact night-coverage [email or web address] Password [facility] 04/03/2019, 12:49 PM

## 2019-04-03 NOTE — Progress Notes (Signed)
Spoke with pt's daughter and gave her an update regarding his father's condition, and oxygen issues over night and this morning. Will continue to monitor.

## 2019-04-03 NOTE — Progress Notes (Signed)
Primary RN initiated a RRT call and notified CN.  CN notified RT and AC about the patient.  Patient was found by primary RN with Oxygen pulled of and LOC decreased.  Patient placed on Heated HFNC 50L at 100% and 12 lead EKG being done along with Neuro Assessment at this time.

## 2019-04-03 NOTE — Progress Notes (Signed)
Pt said he is unable to provide sputum sample for culture. hre said  his cough is dry

## 2019-04-04 ENCOUNTER — Other Ambulatory Visit: Payer: Self-pay

## 2019-04-04 LAB — CBC WITH DIFFERENTIAL/PLATELET
Abs Immature Granulocytes: 0.1 10*3/uL — ABNORMAL HIGH (ref 0.00–0.07)
Basophils Absolute: 0 10*3/uL (ref 0.0–0.1)
Basophils Relative: 0 %
Eosinophils Absolute: 0 10*3/uL (ref 0.0–0.5)
Eosinophils Relative: 0 %
HCT: 46.8 % (ref 39.0–52.0)
Hemoglobin: 14.8 g/dL (ref 13.0–17.0)
Immature Granulocytes: 1 %
Lymphocytes Relative: 3 %
Lymphs Abs: 0.4 10*3/uL — ABNORMAL LOW (ref 0.7–4.0)
MCH: 31 pg (ref 26.0–34.0)
MCHC: 31.6 g/dL (ref 30.0–36.0)
MCV: 97.9 fL (ref 80.0–100.0)
Monocytes Absolute: 1 10*3/uL (ref 0.1–1.0)
Monocytes Relative: 7 %
Neutro Abs: 12.5 10*3/uL — ABNORMAL HIGH (ref 1.7–7.7)
Neutrophils Relative %: 89 %
Platelets: 240 10*3/uL (ref 150–400)
RBC: 4.78 MIL/uL (ref 4.22–5.81)
RDW: 14.6 % (ref 11.5–15.5)
WBC: 14 10*3/uL — ABNORMAL HIGH (ref 4.0–10.5)
nRBC: 0.2 % (ref 0.0–0.2)

## 2019-04-04 LAB — COMPREHENSIVE METABOLIC PANEL
ALT: 34 U/L (ref 0–44)
AST: 20 U/L (ref 15–41)
Albumin: 2.9 g/dL — ABNORMAL LOW (ref 3.5–5.0)
Alkaline Phosphatase: 90 U/L (ref 38–126)
Anion gap: 15 (ref 5–15)
BUN: 53 mg/dL — ABNORMAL HIGH (ref 8–23)
CO2: 34 mmol/L — ABNORMAL HIGH (ref 22–32)
Calcium: 9.2 mg/dL (ref 8.9–10.3)
Chloride: 97 mmol/L — ABNORMAL LOW (ref 98–111)
Creatinine, Ser: 1.09 mg/dL (ref 0.61–1.24)
GFR calc Af Amer: >60 mL/min (ref >60)
GFR calc non Af Amer: >60 mL/min (ref >60)
Glucose, Bld: 146 mg/dL — ABNORMAL HIGH (ref 70–99)
Potassium: 3.9 mmol/L (ref 3.5–5.1)
Sodium: 146 mmol/L — ABNORMAL HIGH (ref 135–145)
Total Bilirubin: 1.2 mg/dL (ref 0.3–1.2)
Total Protein: 6.3 g/dL — ABNORMAL LOW (ref 6.5–8.1)

## 2019-04-04 LAB — FERRITIN
Ferritin: 627 ng/mL — ABNORMAL HIGH (ref 24–336)
Ferritin: 540 ng/mL — ABNORMAL HIGH (ref 24–336)

## 2019-04-04 LAB — PHOSPHORUS: Phosphorus: 4.8 mg/dL — ABNORMAL HIGH (ref 2.5–4.6)

## 2019-04-04 LAB — GLUCOSE, CAPILLARY
Glucose-Capillary: 122 mg/dL — ABNORMAL HIGH (ref 70–99)
Glucose-Capillary: 123 mg/dL — ABNORMAL HIGH (ref 70–99)
Glucose-Capillary: 139 mg/dL — ABNORMAL HIGH (ref 70–99)
Glucose-Capillary: 141 mg/dL — ABNORMAL HIGH (ref 70–99)
Glucose-Capillary: 156 mg/dL — ABNORMAL HIGH (ref 70–99)

## 2019-04-04 LAB — C-REACTIVE PROTEIN: CRP: 9.9 mg/dL — ABNORMAL HIGH (ref <1.0)

## 2019-04-04 LAB — D-DIMER, QUANTITATIVE: D-Dimer, Quant: 1.8 ug/mL-FEU — ABNORMAL HIGH (ref 0.00–0.50)

## 2019-04-04 LAB — MAGNESIUM: Magnesium: 2.4 mg/dL (ref 1.7–2.4)

## 2019-04-04 LAB — PROCALCITONIN: Procalcitonin: <0.1 ng/mL

## 2019-04-04 MED ORDER — METOPROLOL TARTRATE 5 MG/5ML IV SOLN
5.0000 mg | Freq: Three times a day (TID) | INTRAVENOUS | Status: DC
  Administered 2019-04-04 – 2019-04-05 (×5): 5 mg via INTRAVENOUS
  Filled 2019-04-04 (×5): qty 5

## 2019-04-04 MED ORDER — POTASSIUM CHLORIDE 10 MEQ/100ML IV SOLN
10.0000 meq | Freq: Once | INTRAVENOUS | Status: AC
  Administered 2019-04-04: 10 meq via INTRAVENOUS
  Filled 2019-04-04: qty 100

## 2019-04-04 NOTE — Plan of Care (Signed)
Updated pt on POC, pt verbalized understaning

## 2019-04-04 NOTE — Progress Notes (Signed)
Attempted to call SLP, no answer at this time.

## 2019-04-04 NOTE — Progress Notes (Signed)
PROGRESS NOTE    Justin Atkinson.  [id number] DOB: [birth year] DOA: 02/04/2019 PCP: Justin Infante, MD   Brief Narrative:  79 year old  WM PMHx COPD/pulmonary fibrosis recently diagnosed hypersensitivity pneumonitis (had VATS wedge resection on 11/9;follows with Dr. Vaughan Atkinson) who was recently started on steroid on 11/18, HTN, diabetes type 2 uncontrolled with complication, HLD, and DJD   Presented with increasing shortness of breath with nasal congestion, cough for about 10 days. Also noticed that his oxygen dropped to 89% on room air. Called the pulmonary office who asked him to come to the ED. In the ED he is hypoxicat80%, tachypneic and mildly hypertensive. CT angiogram of the chest was negative for PE and showed bilateral diffuse groundglass opacity. Admitted to ICU on high flow started on IV Solu-Medrol. COVID-19 test returned as positive. In the ED he had elevated inflammatory markers including LDH, ferritin, CRP, lactic acid and D-dimer. Patient remains on high flow oxygen. 03/01/2019: Remains 100% high flow nasal cannula oxygen.  Transferring to [facility] to continue treatment.  NOTE review of Dr. Vaughan Atkinson PCCM note 02/18/2019 patient was sent home on 2 L O2, pulmonary fibrosis, RIGHT VATS/11/9 performed showed hypersensitivity pneumonitis   Subjective: 1/2 afebrile overnight A/O x4, negative abdominal pain, main concern is he would like to eat and drink.  Positive S OB.  Requires encouragement to lay on his side/prone   Assessment & Plan:   Principal Problem:   Acute respiratory failure with hypoxia ([place]) Active Problems:   Hypertension   Hypercholesterolemia   Benign prostate hyperplasia   ILD (interstitial lung disease) (HCC)   Postinflammatory pulmonary fibrosis (HCC)   HCAP (healthcare-associated pneumonia)   Chronic obstructive pulmonary disease (HCC)   Pneumonia due to COVID-19 virus   Severe sepsis ([place])   Essential hypertension   Diabetes  mellitus type 2, controlled, with complications ([place])   Pulmonary fibrosis (HCC)   Hypersensitivity pneumonitis (HCC)   Hypokalemia   Physical deconditioning   Aspiration pneumonia of both lower lobes due to gastric secretions (HCC)   Dysphagia   Atrial fibrillation with RVR (HCC)   Covid pneumonia/acute respiratory failure with hypoxia COVID-19 Labs  Recent Labs    04/02/19 0125 04/03/19 0912 04/04/19 0119  DDIMER 0.54* 1.43* 1.80*  FERRITIN 561* 625* 627*  CRP 20.0* 13.3* 9.9*    Lab Results  Component Value Date   SARSCOV2NAA POSITIVE (A) 02/26/2019   SARSCOV2NAA NOT DETECTED 02/05/2019   SARSCOV2NAA NOT DETECTED 01/09/2019   -12/14 increase Solu-Medrol 60 mg BID--> 12/24 prednisone steroid taper started -Remdesivir per pharmacy protocol -11/29 Actemra x1 dose -11/30 Covid Convalescent Plasma x1  -Albuterol PRN -Flutter valve -Incentive spirometry -Titrate O2 to maintain SPO2 88-93% -Prone patient 16 hours/day; if patient cannot tolerate prone at least 2 to 3 hours per shift -Morphine PRN air hunger have decreased dose -12/30 DC prednisone taper.  -12/31 Solu-Medrol 40 mg daily -See aspiration pneumonia/HTN  HCAP/severe sepsis -Trend procalcitonin Results for Justin Atkinson (MRN [id number]) as of 03/01/2019 18:22  Ref. Range 02/19/2019 11:47 02/28/2019 07:57 03/01/2019 03:33  Procalcitonin Latest Units: ng/mL 0.13 <0.10 <0.10  -Completed cefepime -12/10 sputum; normal respiratory flora -12/29 PCXR shows worsening infiltrates; procalcitonin/lactic acid WNL  Aspiration pneumonia -Patient now n.p.o. -12/31 start Unasyn complete 7-day course -12/31 patient now on [place], secondary to decompensating today -1/1 broaden patient's antibiotic coverage to HCAP  Pulmonary edema -See hypertension  Pulmonary fibrosis/Hypersensitivity Pneumonitis -Per Dr. Vaughan Atkinson PCCM note 11/9 worsening pulmonary fibrosis s  -11/9 RIGHT VATS;  per Dr. Vaughan Atkinson PCCM note showed  hypersensitivity pneumonitis  -See Covid pneumonia  A. fib with RVR -1/1 patient has been well controlled but now rate increasing. -1/1 Cardizem drip  -1/2 metoprolol IV 5 mg TID  Hypertension -BP medication discontinued patient's BP has been running on the low side. -In and out -11.4 L -Daily weight Filed Weights   04/01/19 1213 04/03/19 0500 04/04/19 0119  Weight: 117.2 kg 110.1 kg 110.1 kg  -12/31 Albumin 50 g + Lasix 60 mg -1/1 Lasix 80 mg TID  HLD -Crestor 10 mg daily  Diabetes type 2 controlled with complication -123456 hemoglobin A1c= 7.0 -Sensitive SSI -NovoLog 6 units qac -12/30 Lantus 8 units daily  Anxiety/Depression -12/29 increase Xanax 0.5 mg BID PRN -12/29 Cymbalta 20 mg daily  Dysphagia -Patient had been on dysphagia diet, now n.p.o. secondary to aspiration  Insomnia -Patient on Seroquel 50 mg qhs   Daytime sleepiness -Resolved  Hypokalemia -Potassium goal> 4   Glaucoma -Per daughter Justin Atkinson patient has some type of glaucoma may be in his chart under my chart   Deconditioning -All of patient's respiratory status slowly improving patient is becoming really deconditioned.    DVT prophylaxis: Lovenox  Code Status: Full Family Communication: 04/03/2019 spoke with daughter counseled her that patient has basically given up, even though he states he has not.  Will not follow commands to lay proned, even though he states he understands that his O2 level is continuing to drop.  Counseled Justin Atkinson that probably will not live out the night given his deteriorating respiratory status and the fact that he has apparently given up on improving.  Plan is for Justin Atkinson and mother to drive up this evening while patient is still competent and have a last visit.  Justin Atkinson (daughter) as [place] [phone]   Disposition Plan: TBD   Consultants:    Procedures/Significant Events:  11/30 Covid Convalescent Plasma x1  12/22 CT chest Wo contrast;-interval development of small  pneumomediastinum. -Diffuse interstitial and airspace densities slightly progressed since the prior CT. Clinical correlation and follow-up to solution recommended. -Aortic Atherosclerosis and Emphysema 12/27 PCXR;-increased pulmonary interstitium with patchy opacities are identified throughout the left lung and in the right mid and lower lung worsened compared prior exam, -Superimposed pneumonia is not excluded. 12/31 PCXR;-diffuse increased parenchymal opacities bilaterally likely related to a combination of pulmonary edema and possibly superimposed pneumonia    I have personally reviewed and interpreted all radiology studies and my findings are as above.  VENTILATOR SETTINGS: HHFNC 1/2 flow rate; 50 L/min FiO2; 100% SPO2; 87%    Cultures 11/27 SARS coronavirus positive 11/29 influenza A/B negative 12/10 sputum normal respiratory flora    Antimicrobials: Anti-infectives (From admission, onward)   Start     Dose/Rate Stop   03/01/19 0800  remdesivir 100 mg in sodium chloride 0.9 % 250 mL IVPB     100 mg 500 mL/hr over 30 Minutes 03/04/19 1216   02/28/19 1000  remdesivir 200 mg in sodium chloride 0.9 % 250 mL IVPB     200 mg 500 mL/hr over 30 Minutes 02/28/19 1006   02/28/19 0000  vancomycin (VANCOCIN) 1,250 mg in sodium chloride 0.9 % 250 mL IVPB  Status:  Discontinued     1,250 mg 166.7 mL/hr over 90 Minutes 02/16/2019 2037   02/20/2019 2200  ceFEPIme (MAXIPIME) 2 g in sodium chloride 0.9 % 100 mL IVPB  Status:  Discontinued     2 g 200 mL/hr over 30 Minutes 03/03/19 1242  02/15/2019 1315  ceFEPIme (MAXIPIME) 2 g in sodium chloride 0.9 % 100 mL IVPB     2 g 200 mL/hr over 30 Minutes 02/28/19 0324   03/02/2019 1315  vancomycin (VANCOCIN) 2,000 mg in sodium chloride 0.9 % 500 mL IVPB     2,000 mg 250 mL/hr over 120 Minutes 02/28/19 0324       Devices    LINES / TUBES:      Continuous Infusions: . azithromycin 500 mg (04/04/19 0931)  . ceFEPime (MAXIPIME) IV 2 g  (04/04/19 [id number])     Objective: Vitals:   04/04/19 0805 04/04/19 0810 04/04/19 0815 04/04/19 0820  BP:    121/70  Pulse: 80 (!) 29 73 68  Resp: (!) 27 (!) 28 (!) 36 (!) 34  Temp:    97.6 F (36.4 C)  TempSrc:    Oral  SpO2: (!) 81% (!) 84% (!) 85% (!) 87%  Weight:      Height:        Intake/Output Summary (Last 24 hours) at 04/04/2019 1105 Last data filed at 04/04/2019 0950 Gross per 24 hour  Intake 1493.79 ml  Output 1945 ml  Net -451.21 ml   Filed Weights   04/01/19 1213 04/03/19 0500 04/04/19 0119  Weight: 117.2 kg 110.1 kg 110.1 kg   Physical Exam:  General: A/O x4, positive acute respiratory distress Eyes: negative scleral hemorrhage, negative anisocoria, negative icterus ENT: Negative Runny nose, negative gingival bleeding, Neck:  Negative scars, masses, torticollis, lymphadenopathy, JVD Lungs: Decreased breath sounds bilaterally without wheezes or crackles Cardiovascular: Tachycardia, regular rate and rhythm without murmur gallop or rub normal S1 and S2 Abdomen: MORBIDLY OBESE negative abdominal pain, nondistended, positive soft, bowel sounds, no rebound, no ascites, no appreciable mass Extremities: No significant cyanosis, clubbing, or edema bilateral lower extremities Skin: Negative rashes, lesions, ulcers Psychiatric: Positive depression, positive anxiety, negative fatigue, negative mania  Central nervous system:  Cranial nerves II through XII intact, tongue/uvula midline, all extremities muscle strength 5/5, sensation intact throughout, negative dysarthria, negative expressive aphasia, negative receptive aphasia.      Data Reviewed: Care during the described time interval was provided by me .  I have reviewed this patient's available data, including medical history, events of note, physical examination, and all test results as part of my evaluation.   CBC: Recent Labs  Lab 03/31/19 0247 04/01/19 0135 04/02/19 0125 04/03/19 0912 04/04/19 0119  WBC 9.9 9.7  10.1 10.4 14.0*  NEUTROABS 7.9* 8.0* 9.2* 9.5* 12.5*  HGB 13.6 12.3* 12.9* 13.6 14.8  HCT 42.3 38.4* 40.7 43.2 46.8  MCV 96.4 95.0 97.4 96.4 97.9  PLT 199 196 190 215 [id number]   Basic Metabolic Panel: Recent Labs  Lab 03/30/19 1150 03/31/19 0247 04/01/19 0135 04/02/19 0125 04/03/19 0912 04/04/19 0119  NA 137 139 138 138 143  --   K 3.4* 4.3 3.8 4.2 3.5  --   CL 90* 94* 95* 92* 94*  --   CO2 34* 35* 34* 34* 33*  --   GLUCOSE 174* 106* 139* 104* 121*  --   BUN 38* 36* 33* 32* 36*  --   CREATININE 0.96 0.95 0.83 0.68 0.85  --   CALCIUM 8.8* 8.9 8.8* 9.2 9.2  --   MG 2.2 2.3 2.2 2.4 2.4 2.4  PHOS 2.7 3.4 3.6 4.6 4.9* 4.8*   GFR: Estimated Creatinine Clearance: 96.2 mL/min (by C-G formula based on SCr of 0.85 mg/dL). Liver Function Tests: Recent Labs  Lab 03/30/19 1150 03/31/19  [id number] 04/01/19 0135 04/02/19 0125 04/03/19 0912  AST 24 20 22 22 18   ALT 51* 45* 38 35 34  ALKPHOS 63 62 64 79 86  BILITOT 0.9 1.0 1.0 0.8 1.1  PROT 6.1* 5.7* 5.3* 5.8* 6.1*  ALBUMIN 3.2* 3.2* 2.7* 2.8* 2.8*   No results for input(s): LIPASE, AMYLASE in the last 168 hours. No results for input(s): AMMONIA in the last 168 hours. Coagulation Profile: No results for input(s): INR, PROTIME in the last 168 hours. Cardiac Enzymes: No results for input(s): CKTOTAL, CKMB, CKMBINDEX, TROPONINI in the last 168 hours. BNP (last 3 results) No results for input(s): PROBNP in the last 8760 hours. HbA1C: No results for input(s): HGBA1C in the last 72 hours. CBG: Recent Labs  Lab 04/03/19 0706 04/03/19 1144 04/03/19 1702 04/04/19 0019 04/04/19 0821  GLUCAP 129* 110* 174* 122* 139*   Lipid Profile: No results for input(s): CHOL, HDL, LDLCALC, TRIG, CHOLHDL, LDLDIRECT in the last 72 hours. Thyroid Function Tests: No results for input(s): TSH, T4TOTAL, FREET4, T3FREE, THYROIDAB in the last 72 hours. Anemia Panel: Recent Labs    04/03/19 0912 04/04/19 0119  FERRITIN 625* 627*   Urine analysis:      Component Value Date/Time   COLORURINE YELLOW 02/24/2019 1915   APPEARANCEUR CLEAR 02/22/2019 1915   LABSPEC >1.046 (H) 03/01/2019 1915   PHURINE 6.0 02/01/2019 1915   GLUCOSEU NEGATIVE 02/16/2019 1915   HGBUR NEGATIVE 02/26/2019 1915   [place] NEGATIVE 02/17/2019 [place] 02/08/2019 [place] NEGATIVE 02/02/2019 1915   NITRITE NEGATIVE 03/01/2019 1915   LEUKOCYTESUR NEGATIVE 02/04/2019 1915   Sepsis Labs: @LABRCNTIP (procalcitonin:4,lacticidven:4)  ) Recent Results (from the past 240 hour(s))  Culture, blood (Routine X 2) w Reflex to ID Panel     Status: None (Preliminary result)   Collection Time: 04/03/19 11:08 AM   Specimen: BLOOD  Result Value Ref Range Status   Specimen Description   Final    BLOOD RIGHT ARM Performed at [facility], [place] [address], [place], [place] [address]    Special Requests   Final    BOTTLES DRAWN AEROBIC AND ANAEROBIC Blood Culture adequate volume Performed at [facility] [address], [place], [place] [address]    Culture   Final    NO GROWTH < 24 HOURS Performed at [facility] [address], [place], [place] [address]    Report Status PENDING  Incomplete  Culture, blood (Routine X 2) w Reflex to ID Panel     Status: None (Preliminary result)   Collection Time: 04/03/19 11:15 AM   Specimen: BLOOD  Result Value Ref Range Status   Specimen Description   Final    BLOOD RIGHT ARM Performed at [facility] [address] [place], [place] [address]    Special Requests   Final    BOTTLES DRAWN AEROBIC ONLY Blood Culture adequate volume Performed at [facility] [address] [place], [place] [address]    Culture   Final    NO GROWTH < 24 HOURS Performed at [facility] [address], [place], [place] [address]    Report Status PENDING  Incomplete         Radiology Studies: No results  found.      Scheduled Meds: . aspirin EC  81 mg Oral Daily  . DULoxetine  20 mg Oral Daily  . enoxaparin (LOVENOX) injection  60 mg Subcutaneous Q24H  .  feeding supplement (ENSURE ENLIVE)  237 mL Oral BID BM  . fluticasone  2 spray Each Nare Daily  . furosemide  80 mg Intravenous TID AC  . guaiFENesin  1,200 mg Oral BID  . insulin aspart  0-9 Units Subcutaneous TID WC  . insulin aspart  6 Units Subcutaneous TID WC  . insulin glargine  8 Units Subcutaneous Daily  . latanoprost  1 drop Both Eyes QHS  . loratadine  10 mg Oral Daily  . mouth rinse  15 mL Mouth Rinse BID  . methylPREDNISolone (SOLU-MEDROL) injection  40 mg Intravenous Daily  . multivitamin with minerals  1 tablet Oral Daily  . omega-3 acid ethyl esters  1 g Oral Daily  . pantoprazole  40 mg Oral Daily  . QUEtiapine  50 mg Oral QHS  . rosuvastatin  10 mg Oral Daily  . timolol  1 drop Left Eye BID  . umeclidinium-vilanterol  1 puff Inhalation Daily   Continuous Infusions: . azithromycin 500 mg (04/04/19 0931)  . ceFEPime (MAXIPIME) IV 2 g (04/04/19 0637)     LOS: 36 days   The patient is critically ill with multiple organ systems failure and requires high complexity decision making for assessment and support, frequent evaluation and titration of therapies, application of advanced monitoring technologies and extensive interpretation of multiple databases. Critical Care Time devoted to patient care services described in this note  Time spent: 40 minutes     Kamar Callender, Geraldo Docker, MD [facility] Pager [phone]  If 7PM-7AM, please contact night-coverage [email or web address] Password [email or web address] 04/04/2019, 11:05 AM

## 2019-04-04 NOTE — Progress Notes (Signed)
SLP Cancellation Note  Patient Details Name: Justin Atkinson. MRN: [id number] DOB: 1940/07/23   Cancelled treatment:       Reason Eval/Treat Not Completed: Other (comment). Checked in with MD about new order. He anticipates pt readiness for FEES sometime next week, maybe Tuesday. Will check back in for readiness on Monday  Herbie Baltimore, [place] Pager [phone] Office [phone]  Lynann Beaver 04/04/2019, 3:22 PM

## 2019-04-04 NOTE — Plan of Care (Signed)
Discussed with patient and family plan of care for the evening, oxygenation and importance of deep breathing with some teach back displayed.  Spoke to family during visit and again after they had more questions later which were answered at that time.

## 2019-04-05 DIAGNOSIS — I4891 Unspecified atrial fibrillation: Secondary | ICD-10-CM

## 2019-04-05 LAB — CBC WITH DIFFERENTIAL/PLATELET
Abs Immature Granulocytes: 0.05 10*3/uL (ref 0.00–0.07)
Basophils Absolute: 0 10*3/uL (ref 0.0–0.1)
Basophils Relative: 0 %
Eosinophils Absolute: 0 10*3/uL (ref 0.0–0.5)
Eosinophils Relative: 0 %
HCT: 45.1 % (ref 39.0–52.0)
Hemoglobin: 13.8 g/dL (ref 13.0–17.0)
Immature Granulocytes: 1 %
Lymphocytes Relative: 5 %
Lymphs Abs: 0.5 10*3/uL — ABNORMAL LOW (ref 0.7–4.0)
MCH: 30.4 pg (ref 26.0–34.0)
MCHC: 30.6 g/dL (ref 30.0–36.0)
MCV: 99.3 fL (ref 80.0–100.0)
Monocytes Absolute: 0.8 10*3/uL (ref 0.1–1.0)
Monocytes Relative: 7 %
Neutro Abs: 9.4 10*3/uL — ABNORMAL HIGH (ref 1.7–7.7)
Neutrophils Relative %: 87 %
Platelets: 186 10*3/uL (ref 150–400)
RBC: 4.54 MIL/uL (ref 4.22–5.81)
RDW: 14.4 % (ref 11.5–15.5)
WBC: 10.8 10*3/uL — ABNORMAL HIGH (ref 4.0–10.5)
nRBC: 0.2 % (ref 0.0–0.2)

## 2019-04-05 LAB — COMPREHENSIVE METABOLIC PANEL
ALT: 31 U/L (ref 0–44)
AST: 19 U/L (ref 15–41)
Albumin: 2.8 g/dL — ABNORMAL LOW (ref 3.5–5.0)
Alkaline Phosphatase: 78 U/L (ref 38–126)
Anion gap: 15 (ref 5–15)
BUN: 67 mg/dL — ABNORMAL HIGH (ref 8–23)
CO2: 33 mmol/L — ABNORMAL HIGH (ref 22–32)
Calcium: 9.1 mg/dL (ref 8.9–10.3)
Chloride: 96 mmol/L — ABNORMAL LOW (ref 98–111)
Creatinine, Ser: 1.42 mg/dL — ABNORMAL HIGH (ref 0.61–1.24)
GFR calc Af Amer: 55 mL/min — ABNORMAL LOW (ref >60)
GFR calc non Af Amer: 48 mL/min — ABNORMAL LOW (ref >60)
Glucose, Bld: 98 mg/dL (ref 70–99)
Potassium: 3.7 mmol/L (ref 3.5–5.1)
Sodium: 144 mmol/L (ref 135–145)
Total Bilirubin: 1.2 mg/dL (ref 0.3–1.2)
Total Protein: 5.8 g/dL — ABNORMAL LOW (ref 6.5–8.1)

## 2019-04-05 LAB — MAGNESIUM: Magnesium: 2.6 mg/dL — ABNORMAL HIGH (ref 1.7–2.4)

## 2019-04-05 LAB — GLUCOSE, CAPILLARY
Glucose-Capillary: 105 mg/dL — ABNORMAL HIGH (ref 70–99)
Glucose-Capillary: 128 mg/dL — ABNORMAL HIGH (ref 70–99)
Glucose-Capillary: 134 mg/dL — ABNORMAL HIGH (ref 70–99)
Glucose-Capillary: 117 mg/dL — ABNORMAL HIGH (ref 70–99)

## 2019-04-05 LAB — LEGIONELLA PNEUMOPHILA SEROGP 1 UR AG: L. pneumophila Serogp 1 Ur Ag: NEGATIVE

## 2019-04-05 LAB — PHOSPHORUS: Phosphorus: 8.2 mg/dL — ABNORMAL HIGH (ref 2.5–4.6)

## 2019-04-05 LAB — C-REACTIVE PROTEIN: CRP: 5.2 mg/dL — ABNORMAL HIGH (ref <1.0)

## 2019-04-05 LAB — FERRITIN: Ferritin: 523 ng/mL — ABNORMAL HIGH (ref 24–336)

## 2019-04-05 LAB — D-DIMER, QUANTITATIVE: D-Dimer, Quant: 0.99 ug/mL-FEU — ABNORMAL HIGH (ref 0.00–0.50)

## 2019-04-05 MED ORDER — CHLORHEXIDINE GLUCONATE CLOTH 2 % EX PADS
6.0000 | MEDICATED_PAD | Freq: Every day | CUTANEOUS | Status: DC
  Administered 2019-04-05 – 2019-04-06 (×2): 6 via TOPICAL

## 2019-04-05 MED ORDER — SODIUM CHLORIDE 0.9 % IV SOLN
2.0000 g | Freq: Two times a day (BID) | INTRAVENOUS | Status: DC
  Administered 2019-04-05 – 2019-04-06 (×2): 2 g via INTRAVENOUS
  Filled 2019-04-05 (×2): qty 2

## 2019-04-05 NOTE — Progress Notes (Signed)
PHARMACY NOTE:  ANTIMICROBIAL RENAL DOSAGE ADJUSTMENT  Current antimicrobial regimen includes a mismatch between antimicrobial dosage and estimated renal function.  As per policy approved by the Pharmacy & Therapeutics and Medical Executive Committees, the antimicrobial dosage will be adjusted accordingly.  Current antimicrobial dosage:  Cefepime 2 gm IV q8hr Indication: CAP Renal Function: Scr 1.42 (~CrCl 57)  Estimated Creatinine Clearance: 57.8 mL/min (A) (by C-G formula based on SCr of 1.42 mg/dL (H)).     Antimicrobial dosage has been changed to:  Cefepime 2 gm IV q12hr  Additional comments:   Thank you for allowing pharmacy to be a part of this patient's care.  Gillian Scarce, [facility] 04/05/2019 11:54 AM

## 2019-04-05 NOTE — Progress Notes (Signed)
Justin Atkinson did well today beginning the day saturating around 91% on 50L 100% fiO2 HHFNC with 15L NRB.  He ahs remained in bed the majority of the day d/t general weakness from his disease process.  Patiwent HR remains irregular and ranges from 80's-110's.  Patient is QAOx4.  He is in good spirits.  The condom cath he was wearing failed 3 times today but I/O's are needed for patients lasix therapy evaluation.  Therefor Dr. [place] Pink allowed for a foley catheter to be inserted. Urine is light yellow and clear.  Patient was bathed 2 times today with CHG wipes.  He has spoken to his family and so has Dr. [place] Pink d/t patients declining saturation of low 80's on same respiratory therapy tx.  Patient has been educated about his declining respiratory status and physician has ordered a spiritual consult for patient.  Swallow study to be performed tomorrow although patient has enjoyed small sips of ice drink today with no signs of aspiration.

## 2019-04-05 NOTE — Progress Notes (Signed)
PROGRESS NOTE  Justin Atkinson. [id number] DOB: Oct 31, 1940 DOA: 02/13/2019 PCP: [name], MD  HPI/Recap of past 69 hours: 79 year old male with past medical history of COPD and pulmonary fibrosis recently diagnosed with hypersensitivity pneumonitis status post VATS wedge resection 11/9+ diabetes mellitus admitted on 11/27 after coming in with cough and worsening shortness of breath x10 days.  In the emergency room, tested positive for Covid and placed in the ICU.  Following hospitalization, he was treated with 1 dose of Actemra, full course of remdesivir and IV steroids plus convalescent plasma.  Along with this plus proning, patient still has not improved in his oxygenation.  Patient's hospital course was complicated by severe sepsis secondary to dysphagia causing aspiration pneumonia, atrial fibrillation with rapid ventricular rate and dysphagia.  Patient's oxygenation continues to worsen and he is currently on maximum flow oxygen by nasal cannula and facemask.  Despite this, his oxygen levels have started to trend downward and at times in the 80% range.    Despite this, the patient himself is rather comfortable and his only complaint was of being thirsty, so he was given something to drink.  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  On IV antibiotics broad-spectrum.  Despite this, he is on maximal oxygenation and continues to decline.  It has been discussed with both the patient as well as his family that he likely will not survive this hospitalization.  Everyone understands.  I have allowed him comfort feedings and sips.  Family was allowed to see him earlier today to say goodbye.  Family has requested  chaplain services which I have placed the order for. Active Problems:   Hypertension: Pressure slightly soft.  Continue to follow    Hypercholesterolemia: Continue statin.    Benign prostate hyperplasia    Diabetes mellitus type 2, controlled, with complications ([place]): 123456 at 7.0.  Sensitive sliding scale with some basal Lantus.  AKI: Creatinine has been trending upward.  Today at 1.42 with GFR of 48.  Likely due to decreased p.o. intake    Hypokalemia: Replacing as needed    Aspiration pneumonia of both lower lobes due to gastric secretions (HCC)   Dysphagia: Initially n.p.o., but changing over to comfort feeds   Atrial fibrillation with RVR ([place]): Overall rate controlled.   Code Status: DNR  Family Communication: Updated wife and daughter by phone  Disposition Plan: Unfortunately, patient will likely pass in the next 1 to 2 days   Consultants:  Critical care  Procedures:  None  Antimicrobials:  IV Remdisivir 11/29-12/2  IV vancomycin 11/27-11/28  IV cefepime 11/27-12/1  IV Actemra x1 dose  IV cefepime 1/2-present  IV Zithromax 1/2-present  DVT prophylaxis: Lovenox   Objective: Vitals:   04/05/19 1400 04/05/19 1523  BP: 121/67   Pulse: (!) 48 84  Resp: (!) 26 20  Temp:    SpO2: 91% (!) 84%    Intake/Output Summary (Last 24 hours) at 04/05/2019 1619 Last data filed at 04/05/2019 1346 Gross per 24 hour  Intake --  Output 1800 ml  Net -1800 ml   Filed Weights   04/03/19 0500 04/04/19 0119 04/05/19 0427  Weight: 110.1 kg 110.1 kg 111 kg   Body mass index is 32.3 kg/m.  Exam:   General: Alert and oriented x3, no acute distress  HEENT: Normocephalic,  atraumatic, mucous membranes slightly dry.  On full Venturi mask  Neck: Supple, no JVD  Cardiovascular: Regular rhythm, borderline tachycardia  Respiratory: Decreased breath sounds throughout  Abdomen: Soft, nontender, nondistended, positive bowel sounds  Musculoskeletal: No clubbing or  cyanosis or edema  Skin: No skin breaks, tears or lesions  Psychiatry: Appropriate, no evidence of psychoses   Data Reviewed: CBC: Recent Labs  Lab 04/01/19 0135 04/02/19 0125 04/03/19 0912 04/04/19 0119 04/05/19 0413  WBC 9.7 10.1 10.4 14.0* 10.8*  NEUTROABS 8.0* 9.2* 9.5* 12.5* 9.4*  HGB 12.3* 12.9* 13.6 14.8 13.8  HCT 38.4* 40.7 43.2 46.8 45.1  MCV 95.0 97.4 96.4 97.9 99.3  PLT 196 190 215 240 [id number]   Basic Metabolic Panel: Recent Labs  Lab 04/01/19 0135 04/02/19 0125 04/03/19 0912 04/04/19 0119 04/04/19 1128 04/05/19 0413  NA 138 138 143  --  146* 144  K 3.8 4.2 3.5  --  3.9 3.7  CL 95* 92* 94*  --  97* 96*  CO2 34* 34* 33*  --  34* 33*  GLUCOSE 139* 104* 121*  --  146* 98  BUN 33* 32* 36*  --  53* 67*  CREATININE 0.83 0.68 0.85  --  1.09 1.42*  CALCIUM 8.8* 9.2 9.2  --  9.2 9.1  MG 2.2 2.4 2.4 2.4  --  2.6*  PHOS 3.6 4.6 4.9* 4.8*  --  8.2*   GFR: Estimated Creatinine Clearance: 57.8 mL/min (A) (by C-G formula based on SCr of 1.42 mg/dL (H)). Liver Function Tests: Recent Labs  Lab 04/01/19 0135 04/02/19 0125 04/03/19 0912 04/04/19 1128 04/05/19 0413  AST 22 22 18 20 19   ALT 38 35 34 34 31  ALKPHOS 64 79 86 90 78  BILITOT 1.0 0.8 1.1 1.2 1.2  PROT 5.3* 5.8* 6.1* 6.3* 5.8*  ALBUMIN 2.7* 2.8* 2.8* 2.9* 2.8*   No results for input(s): LIPASE, AMYLASE in the last 168 hours. No results for input(s): AMMONIA in the last 168 hours. Coagulation Profile: No results for input(s): INR, PROTIME in the last 168 hours. Cardiac Enzymes: No results for input(s): CKTOTAL, CKMB, CKMBINDEX, TROPONINI in the last 168 hours. BNP (last 3 results) No results for input(s): PROBNP in the last 8760 hours. HbA1C: No results for input(s): HGBA1C in the last 72 hours. CBG: Recent Labs  Lab 04/04/19 1134 04/04/19 1630 04/04/19 2025 04/05/19 0746 04/05/19 1114  GLUCAP 156* 141* 123* 105* 128*   Lipid Profile: No results for input(s): CHOL, HDL, LDLCALC, TRIG,  CHOLHDL, LDLDIRECT in the last 72 hours. Thyroid Function Tests: No results for input(s): TSH, T4TOTAL, FREET4, T3FREE, THYROIDAB in the last 72 hours. Anemia Panel: Recent Labs    04/04/19 1128 04/05/19 0413  FERRITIN 540* 523*   Urine analysis:    Component Value Date/Time   COLORURINE YELLOW 02/03/2019 1915   APPEARANCEUR CLEAR 02/02/2019 1915   LABSPEC >1.046 (H) 02/10/2019 1915   PHURINE 6.0 02/21/2019 1915   GLUCOSEU NEGATIVE 02/18/2019 1915   HGBUR NEGATIVE 02/26/2019 1915   [place] NEGATIVE 03/01/2019 [place] 02/01/2019 1915   PROTEINUR NEGATIVE 02/19/2019 1915   NITRITE NEGATIVE 02/11/2019 1915   LEUKOCYTESUR NEGATIVE 02/21/2019 1915   Sepsis Labs: @LABRCNTIP (procalcitonin:4,lacticidven:4)  ) Recent Results (from the past 240 hour(s))  Culture, blood (Routine X 2) w Reflex to ID Panel     Status: None (Preliminary result)   Collection Time: 04/03/19 11:08 AM   Specimen: BLOOD  Result Value Ref Range Status   Specimen Description  Final    BLOOD RIGHT ARM Performed at [facility] [address] [place], [place] [address]    Special Requests   Final    BOTTLES DRAWN AEROBIC AND ANAEROBIC Blood Culture adequate volume Performed at [facility] [address] [place], [place] [address]    Culture   Final    NO GROWTH 2 DAYS Performed at [facility] [address] [place], [place] [address]    Report Status PENDING  Incomplete  Culture, blood (Routine X 2) w Reflex to ID Panel     Status: None (Preliminary result)   Collection Time: 04/03/19 11:15 AM   Specimen: BLOOD  Result Value Ref Range Status   Specimen Description   Final    BLOOD RIGHT ARM Performed at [facility] [address], [place], [place] [address]    Special Requests   Final    BOTTLES DRAWN AEROBIC ONLY Blood Culture adequate volume Performed at [facility] [address], [place], [place] [address]    Culture   Final    NO GROWTH 2 DAYS Performed at [facility] [address] [place],  [address]    Report Status PENDING  Incomplete      Studies: No results found.  Scheduled Meds: . aspirin EC  81 mg Oral Daily  . Chlorhexidine Gluconate Cloth  6 each Topical Daily  . DULoxetine  20 mg Oral Daily  . enoxaparin (LOVENOX) injection  60 mg Subcutaneous Q24H  . feeding supplement (ENSURE ENLIVE)  237 mL Oral BID BM  . fluticasone  2 spray Each Nare Daily  . furosemide  80 mg Intravenous TID AC  . guaiFENesin  1,200 mg Oral BID  . insulin aspart  0-9 Units Subcutaneous TID WC  . insulin aspart  6 Units Subcutaneous TID WC  . insulin glargine  8 Units Subcutaneous Daily  . latanoprost  1 drop Both Eyes QHS  . loratadine  10 mg Oral Daily  . mouth rinse  15 mL Mouth Rinse BID  . methylPREDNISolone (SOLU-MEDROL) injection  40 mg Intravenous Daily  . metoprolol tartrate  5 mg Intravenous Q8H  . multivitamin with minerals  1 tablet Oral Daily  . omega-3 acid ethyl esters  1 g Oral Daily  . pantoprazole  40 mg Oral Daily  . QUEtiapine  50 mg Oral QHS  . rosuvastatin  10 mg Oral Daily  . timolol  1 drop Left Eye BID  . umeclidinium-vilanterol  1 puff Inhalation Daily    Continuous Infusions: . azithromycin Stopped (04/05/19 0920)  . ceFEPime (MAXIPIME) IV       LOS: 37 days     Annita Brod, MD [facility]  To reach me or the doctor on call, go to: [email or web address] Password [email or web address]  04/05/2019, 4:19 PM

## 2019-04-06 LAB — CBC WITH DIFFERENTIAL/PLATELET
Abs Immature Granulocytes: 0.05 10*3/uL (ref 0.00–0.07)
Basophils Absolute: 0 10*3/uL (ref 0.0–0.1)
Basophils Relative: 0 %
Eosinophils Absolute: 0 10*3/uL (ref 0.0–0.5)
Eosinophils Relative: 0 %
HCT: 48 % (ref 39.0–52.0)
Hemoglobin: 14.6 g/dL (ref 13.0–17.0)
Immature Granulocytes: 1 %
Lymphocytes Relative: 4 %
Lymphs Abs: 0.4 10*3/uL — ABNORMAL LOW (ref 0.7–4.0)
MCH: 30.2 pg (ref 26.0–34.0)
MCHC: 30.4 g/dL (ref 30.0–36.0)
MCV: 99.4 fL (ref 80.0–100.0)
Monocytes Absolute: 0.7 10*3/uL (ref 0.1–1.0)
Monocytes Relative: 7 %
Neutro Abs: 9.7 10*3/uL — ABNORMAL HIGH (ref 1.7–7.7)
Neutrophils Relative %: 88 %
Platelets: 165 10*3/uL (ref 150–400)
RBC: 4.83 MIL/uL (ref 4.22–5.81)
RDW: 14.2 % (ref 11.5–15.5)
WBC: 10.9 10*3/uL — ABNORMAL HIGH (ref 4.0–10.5)
nRBC: 0 % (ref 0.0–0.2)

## 2019-04-06 LAB — COMPREHENSIVE METABOLIC PANEL
ALT: 48 U/L — ABNORMAL HIGH (ref 0–44)
AST: 34 U/L (ref 15–41)
Albumin: 3 g/dL — ABNORMAL LOW (ref 3.5–5.0)
Alkaline Phosphatase: 81 U/L (ref 38–126)
Anion gap: 17 — ABNORMAL HIGH (ref 5–15)
BUN: 86 mg/dL — ABNORMAL HIGH (ref 8–23)
CO2: 34 mmol/L — ABNORMAL HIGH (ref 22–32)
Calcium: 9 mg/dL (ref 8.9–10.3)
Chloride: 90 mmol/L — ABNORMAL LOW (ref 98–111)
Creatinine, Ser: 1.98 mg/dL — ABNORMAL HIGH (ref 0.61–1.24)
GFR calc Af Amer: 37 mL/min — ABNORMAL LOW (ref >60)
GFR calc non Af Amer: 32 mL/min — ABNORMAL LOW (ref >60)
Glucose, Bld: 102 mg/dL — ABNORMAL HIGH (ref 70–99)
Potassium: 3.6 mmol/L (ref 3.5–5.1)
Sodium: 141 mmol/L (ref 135–145)
Total Bilirubin: 1.2 mg/dL (ref 0.3–1.2)
Total Protein: 6 g/dL — ABNORMAL LOW (ref 6.5–8.1)

## 2019-04-06 LAB — FERRITIN: Ferritin: 665 ng/mL — ABNORMAL HIGH (ref 24–336)

## 2019-04-06 LAB — GLUCOSE, CAPILLARY
Glucose-Capillary: 146 mg/dL — ABNORMAL HIGH (ref 70–99)
Glucose-Capillary: 125 mg/dL — ABNORMAL HIGH (ref 70–99)

## 2019-04-06 LAB — MAGNESIUM: Magnesium: 2.7 mg/dL — ABNORMAL HIGH (ref 1.7–2.4)

## 2019-04-06 LAB — D-DIMER, QUANTITATIVE: D-Dimer, Quant: 1.18 ug/mL-FEU — ABNORMAL HIGH (ref 0.00–0.50)

## 2019-04-06 LAB — C-REACTIVE PROTEIN: CRP: 2.8 mg/dL — ABNORMAL HIGH (ref <1.0)

## 2019-04-06 LAB — PHOSPHORUS: Phosphorus: 9.6 mg/dL — ABNORMAL HIGH (ref 2.5–4.6)

## 2019-04-06 MED ORDER — HALOPERIDOL 0.5 MG PO TABS
0.5000 mg | ORAL_TABLET | ORAL | Status: DC | PRN
  Filled 2019-04-06: qty 1

## 2019-04-06 MED ORDER — MORPHINE 100MG IN NS 100ML (1MG/ML) PREMIX INFUSION
1.0000 mg/h | INTRAVENOUS | Status: DC
  Administered 2019-04-06: 1 mg/h via INTRAVENOUS
  Filled 2019-04-06: qty 100

## 2019-04-06 MED ORDER — GLYCOPYRROLATE 1 MG PO TABS
1.0000 mg | ORAL_TABLET | ORAL | Status: DC | PRN
  Filled 2019-04-06: qty 1

## 2019-04-06 MED ORDER — HALOPERIDOL LACTATE 2 MG/ML PO CONC
0.5000 mg | ORAL | Status: DC | PRN
  Filled 2019-04-06: qty 0.3

## 2019-04-06 MED ORDER — BIOTENE DRY MOUTH MT LIQD: 15.0000 mL | OROMUCOSAL | Status: DC | PRN

## 2019-04-06 MED ORDER — GLYCOPYRROLATE 0.2 MG/ML IJ SOLN: 0.2000 mg | INTRAMUSCULAR | Status: DC | PRN

## 2019-04-06 MED ORDER — POLYVINYL ALCOHOL 1.4 % OP SOLN
1.0000 [drp] | Freq: Four times a day (QID) | OPHTHALMIC | Status: DC | PRN
  Filled 2019-04-06: qty 15

## 2019-04-06 MED ORDER — HALOPERIDOL LACTATE 5 MG/ML IJ SOLN: 0.5000 mg | INTRAMUSCULAR | Status: DC | PRN

## 2019-04-07 ENCOUNTER — Other Ambulatory Visit: Payer: Self-pay | Admitting: *Deleted

## 2019-04-07 NOTE — Patient Outreach (Addendum)
[facility]) Care Management  04/07/2019  Justin Atkinson. July 11, 1940 [id number]   Received in-basket notification from [facility] Liaison [name]) patient expired on 11-Apr-2019.  Case closure completed.    [name] [name], BSN, [place] Management [facility] Telephonic CM Phone: [phone] Fax: [phone]

## 2019-04-08 LAB — CULTURE, BLOOD (ROUTINE X 2)
Culture: NO GROWTH
Culture: NO GROWTH
Special Requests: ADEQUATE
Special Requests: ADEQUATE

## 2019-05-04 NOTE — Progress Notes (Addendum)
0745: Assumed care of pt. Pt lethargic, but easily aroused, disoriented, flat affect, withdrawn. SpO2 82% on 50 L HHFNC and NRB. Denies pain. Sips of drink given with meds for comfort. Repositioned, will monitor  0935: Message left with spiritual care office. Requesting EOL visit from chaplain today.   0945: Family updated on patient status. Family appropriate for situation and appreciative.   1215: Pt remains a little withdrawn, asking about how wife and family are doing, acknowledges he does feel very sleepy today. Sips of his favorite, diet coke, given for comfort. Repositioned, denies other needs at this time.  Pt able to communicate with family, chaplain, and father before expiring at 98. Body prepared and all belongings sent with Pt down to morgue. Wife initially wanted wedding ring to be placed with belongings, RN unable to get wedding ring off of finger.

## 2019-05-04 NOTE — Progress Notes (Signed)
Spoke w/ daughter and updates provided and all questions answered. He remains on Heated HFNC 50L 100% and NRB w/ O2 sats 78-86%. His daughter voiced concern about the Chaplain/spiritual consult, and patient stated that he did not see anyone today. Notified Camera operator of families concern for the need for Chaplain to visit w/ patient.

## 2019-05-04 NOTE — Progress Notes (Signed)
Physical Therapy Discharge Patient Details Name: Justin Atkinson. MRN: [id number] DOB: Mar 04, 1941 Today's Date: 05-04-19 Time:  -     Patient discharged from PT services secondary to pt with medical decline. .  Please see latest therapy progress note for current level of functioning and progress toward goals.    Progress and discharge plan discussed with patient and/or caregiver: Patient unable to participate in discharge planning and no caregivers available  GP     Arby Barrette, PT Pager [phone]  Rexanne Mano May 04, 2019, 2:40 PM

## 2019-05-04 NOTE — Progress Notes (Signed)
OT Cancellation Note  Patient Details Name: Justin Atkinson. MRN: [id number] DOB: 11/05/1940   Cancelled Treatment:    Reason Eval/Treat Not Completed: Medical issues which prohibited therapy;Patient not medically ready. Pt on 50L heated HFNC and NRB with SpO2 in 80s. Per RN, pt requesting chaplain visit for EOL. Will sign off.  [place], OTR/L Acute Rehab Pager: [phone] Office: [phone] 04-21-19, 1:44 PM

## 2019-05-04 NOTE — Progress Notes (Addendum)
Chaplain follow up for continued support.    Prayers and support with Mr Sobczak at bedside.  Support with family via conference call in pt's room. Facilitated ritual support with pt's priest via facetime in room.    Chaplain providing support around grief, engaging in life review and facilitating space for pt and family to express values and care.  Family wishes pt not to be alone at end of life.  Nursing assured family that they would be attentive to oxygen and work to be present with pt.  This chaplain will also work to be present with pt.  Please alert to impending end of life via pager [place], [place]  [facility] [place], [facility].

## 2019-05-04 NOTE — Progress Notes (Signed)
PROGRESS NOTE  Justin Atkinson. 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  In the emergency room, tested positive for Covid and placed in the ICU.  Following hospitalization, he was treated with 1 dose of Actemra, full course of remdesivir and IV steroids plus convalescent plasma.  Along with this plus proning, patient still has not improved in his oxygenation.  Patient's hospital course was complicated by severe sepsis secondary to dysphagia causing aspiration pneumonia, atrial fibrillation with rapid ventricular rate and dysphagia.  Patient's oxygenation continues to worsen and he is currently on maximum flow oxygen by nasal cannula and facemask.  Despite this, his oxygen levels have started to trend downward and at times in the 80% range.    At this point, is realizing the patient is actively dying.  Family has been able to make arrangements and see him to say goodbye.  As per request, chaplain able to see him at the bedside.  Patient's only request is for something to drink.  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  On IV antibiotics broad-spectrum.  Despite this, he is on maximal oxygenation and continues to decline.  It has been discussed with both the patient as well as his family that he is dying.  Have initiated comfort care.  Chaplain as per request at the bedside.  Family has been  able to come see him.  Have started morphine drip.  He likely will pass within the next 24-48 hours Active Problems:   Hypertension: Pressure slightly soft.  Continue to follow    Hypercholesterolemia: Discontinue statin now that he is comfort care.    Benign prostate hyperplasia    Diabetes mellitus type 2, controlled, with complications ([place]): 123456 at 7.0.  Discontinued insulin sliding scale and checks now that he is comfort care.  AKI: Creatinine has been trending upward.  Today at 1.42 with GFR of 48.  Likely due to decreased p.o. intake    Hypokalemia: Initially replaced as needed.  Have stopped lab work due to comfort care.    Aspiration pneumonia of both lower lobes due to gastric secretions ([place])   Dysphagia: Initially n.p.o., but changing over to comfort feeds   Atrial fibrillation with RVR ([place]): Overall rate controlled.   Code Status: DNR, comfort care  Family Communication: Updated wife and daughter by phone  Disposition Plan: Unfortunately, patient will likely pass in the next 1 to 2 days   Consultants:  Critical care  Procedures:  None  Antimicrobials:  IV Remdisivir 11/29-12/2  IV vancomycin 11/27-11/28  IV cefepime 11/27-12/1  IV Actemra x1 dose  IV cefepime 1/2-present  IV Zithromax 1/2-present  DVT prophylaxis: Lovenox   Objective: Vitals:   April 24, 2019 0800 April 24, 2019 1158  BP: 116/66 112/65  Pulse: 86 81  Resp: 15 15  Temp: 97.8 F (36.6 C) (!) 96.3 F (35.7 C)  SpO2: (!) 87% (!) 84%    Intake/Output Summary (Last 24 hours) at April 24, 2019 1516 Last data filed at April 24, 2019 1200 Gross per 24 hour  Intake 450 ml  Output 1525  ml  Net -1075 ml   Filed Weights   04/04/19 0119 04/05/19 0427 May 02, 2019 0457  Weight: 110.1 kg 111 kg 110 kg   Body mass index is 31.99 kg/m.  Exam:   General: Alert and oriented x2, no acute distress  HEENT: Normocephalic, atraumatic, mucous membranes slightly dry.  On full Venturi mask  Neck: Supple, no  JVD  Cardiovascular: Regular rhythm, borderline tachycardia  Respiratory: Decreased breath sounds throughout  Abdomen: Soft, nontender, nondistended, positive bowel sounds  Musculoskeletal: No clubbing or cyanosis or edema  Skin: No skin breaks, tears or lesions  Psychiatry: Appropriate, no evidence of psychoses   Data Reviewed: CBC: Recent Labs  Lab 04/02/19 0125 04/03/19 0912 04/04/19 0119 04/05/19 0413 2019/05/02 0705  WBC 10.1 10.4 14.0* 10.8* 10.9*  NEUTROABS 9.2* 9.5* 12.5* 9.4* 9.7*  HGB 12.9* 13.6 14.8 13.8 14.6  HCT 40.7 43.2 46.8 45.1 48.0  MCV 97.4 96.4 97.9 99.3 99.4  PLT 190 215 240 186 [id number]   Basic Metabolic Panel: Recent Labs  Lab 04/02/19 0125 04/03/19 0912 04/04/19 0119 04/04/19 1128 04/05/19 0413 05/02/2019 0705  NA 138 143  --  146* 144 141  K 4.2 3.5  --  3.9 3.7 3.6  CL 92* 94*  --  97* 96* 90*  CO2 34* 33*  --  34* 33* 34*  GLUCOSE 104* 121*  --  146* 98 102*  BUN 32* 36*  --  53* 67* 86*  CREATININE 0.68 0.85  --  1.09 1.42* 1.98*  CALCIUM 9.2 9.2  --  9.2 9.1 9.0  MG 2.4 2.4 2.4  --  2.6* 2.7*  PHOS 4.6 4.9* 4.8*  --  8.2* 9.6*   GFR: Estimated Creatinine Clearance: 41.3 mL/min (A) (by C-G formula based on SCr of 1.98 mg/dL (H)). Liver Function Tests: Recent Labs  Lab 04/02/19 0125 04/03/19 0912 04/04/19 1128 04/05/19 0413 May 02, 2019 0705  AST 22 18 20 19  34  ALT 35 34 34 31 48*  ALKPHOS 79 86 90 78 81  BILITOT 0.8 1.1 1.2 1.2 1.2  PROT 5.8* 6.1* 6.3* 5.8* 6.0*  ALBUMIN 2.8* 2.8* 2.9* 2.8* 3.0*   No results for input(s): LIPASE, AMYLASE in the last 168 hours. No results for input(s): AMMONIA in the last 168 hours. Coagulation Profile: No results for input(s): INR, PROTIME in the last 168 hours. Cardiac Enzymes: No results for input(s): CKTOTAL, CKMB, CKMBINDEX, TROPONINI in the last 168 hours. BNP (last 3 results) No results for input(s): PROBNP in the last 8760 hours. HbA1C: No results for input(s): HGBA1C in the last 72  hours. CBG: Recent Labs  Lab 04/05/19 1114 04/05/19 1546 04/05/19 2045 05-02-19 0857 02-May-2019 1200  GLUCAP 128* 134* 117* 146* 125*   Lipid Profile: No results for input(s): CHOL, HDL, LDLCALC, TRIG, CHOLHDL, LDLDIRECT in the last 72 hours. Thyroid Function Tests: No results for input(s): TSH, T4TOTAL, FREET4, T3FREE, THYROIDAB in the last 72 hours. Anemia Panel: Recent Labs    04/05/19 0413 05/02/19 0705  FERRITIN 523* 665*   Urine analysis:    Component Value Date/Time   COLORURINE YELLOW 02/18/2019 1915   APPEARANCEUR CLEAR 02/21/2019 1915   LABSPEC >1.046 (H) 02/16/2019 1915   PHURINE 6.0 02/13/2019 1915   GLUCOSEU NEGATIVE 02/14/2019 1915   HGBUR NEGATIVE 02/16/2019 1915   [place] NEGATIVE 02/11/2019 1915   KETONESUR NEGATIVE 02/09/2019 1915   PROTEINUR NEGATIVE 02/07/2019 1915   NITRITE NEGATIVE 02/26/2019 1915   LEUKOCYTESUR NEGATIVE 02/15/2019 1915   Sepsis Labs: @LABRCNTIP (procalcitonin:4,lacticidven:4)  )  Recent Results (from the past 240 hour(s))  Culture, blood (Routine X 2) w Reflex to ID Panel     Status: None (Preliminary result)   Collection Time: 04/03/19 11:08 AM   Specimen: BLOOD  Result Value Ref Range Status   Specimen Description   Final    BLOOD RIGHT ARM Performed at [facility] [address] [place], [place] [address]    Special Requests   Final    BOTTLES DRAWN AEROBIC AND ANAEROBIC Blood Culture adequate volume Performed at [facility] [address] [place], [place] [address]    Culture   Final    NO GROWTH 3 DAYS Performed at [facility], [place] [address], [place], [place] [address]    Report Status PENDING  Incomplete  Culture, blood (Routine X 2) w Reflex to ID Panel     Status: None (Preliminary result)   Collection Time: 04/03/19 11:15 AM   Specimen: BLOOD  Result Value Ref Range Status   Specimen Description   Final    BLOOD RIGHT ARM Performed at [facility] [address], [place], [place] [address]    Special Requests   Final    BOTTLES DRAWN AEROBIC ONLY Blood Culture adequate volume Performed at [facility] [address] [place], [place] [address]    Culture   Final    NO GROWTH 3 DAYS Performed at [facility], [place] [address], [place], [place] [address]    Report Status PENDING  Incomplete      Studies: No results found.  Scheduled Meds: . Chlorhexidine Gluconate Cloth  6 each Topical Daily  . DULoxetine  20 mg Oral Daily  . feeding supplement (ENSURE ENLIVE)  237 mL Oral BID BM  . fluticasone  2 spray Each Nare Daily  . furosemide  80 mg Intravenous TID AC  . QUEtiapine  50 mg Oral QHS    Continuous Infusions: . morphine       LOS: 38 days     Annita Brod, MD [facility]  To reach me or the doctor on call, go to: [email or web address] Password [facility]  13-Apr-2019, 3:16 PM

## 2019-05-04 NOTE — Death Summary Note (Signed)
Death Summary  Justin Atkinson. 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  In the emergency room, tested positive for Covid and placed in the ICU.  Following hospitalization, he was treated with 1 dose of Actemra, full course of remdesivir and IV steroids plus convalescent plasma.   Patient's hospital course was complicated by severe sepsis secondary to dysphagia causing aspiration pneumonia, atrial fibrillation with rapid ventricular rate and dysphagia.  Patient's oxygenation continued to worsen and even with currently on maximum flow oxygen by nasal cannula and facemask, his oxygen levels trended downward, below 90%, sometimes less than 80%.  Final Diagnoses:  1.   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  On IV antibiotics broad-spectrum.  Despite this, he is on maximal oxygenation and continues to decline.  It was discussed with both the patient as well as his family that he is dying.    We initiated comfort care.  Chaplain as per request at the bedside.  Family had extremities been able to come see him.  Have started morphine drip.    Patient expired a few hours later at 3:54 PM. Active Problems:   Hypertension: Pressure slightly soft by time of death   Hypercholesterolemia: Discontinue statin once he was made he is comfort care.    Benign prostate hyperplasia    Diabetes mellitus type 2, controlled, with complications ([place]): 123456 at 7.0.  Discontinued insulin sliding scale and checks now that he is comfort care.  AKI: Creatinine has been trending upward.  Peaked at 1.42 with GFR of 48 on 04/27/22.  Likely due to decreased p.o. intake    Hypokalemia: Initially replaced as needed.  Had stopped lab work due to comfort care.    Aspiration pneumonia of both lower lobes due to gastric secretions ([place])   Dysphagia: Initially n.p.o. and once he was made comfortable, was allowed to eat comfort feeds    Atrial fibrillation with RVR ([place]): Overall rate controlled.   The results of significant diagnostics from this hospitalization (including imaging, microbiology, ancillary and laboratory) are listed below for reference.    Significant Diagnostic Studies: CT CHEST WO CONTRAST  Result Date: 03/24/2019 CLINICAL DATA:  79 year old male with left lung atelectasis. EXAM: CT CHEST WITHOUT CONTRAST TECHNIQUE: Multidetector CT imaging of the chest was performed following the standard protocol without IV contrast. COMPARISON:  Chest CT dated 2019-03-21. FINDINGS: Evaluation of this exam is limited in the absence of intravenous contrast. Evaluation is also limited due to respiratory motion artifact. Cardiovascular: There is mild cardiomegaly. No pericardial effusion. The thoracic aorta and central pulmonary arteries are grossly unremarkable. Mediastinum/Nodes: No hilar or mediastinal adenopathy. The esophagus and the thyroid gland are grossly unremarkable. No mediastinal fluid collection. There has been interval development of small pneumomediastinum. Clinical correlation is recommended. Lungs/Pleura: There is background of emphysema. Diffuse interstitial coarsening and reticular densities primarily involving the lung bases noted which may represent pneumonia.  Overall there  has been slight interval progression of pulmonary densities since the prior CT. Clinical correlation and follow-up to solution recommended. There is no pleural effusion or pneumothorax. The central airways remain patent. Upper Abdomen: A 5.5 cm exophytic low attenuating lesion from the right kidney suboptimally characterized, possibly a cyst. Musculoskeletal: Degenerative changes of the spine. No acute osseous pathology. IMPRESSION: 1. Interval development of small pneumomediastinum. 2. Diffuse interstitial and airspace densities slightly progressed since the prior CT. Clinical correlation and follow-up to solution recommended. 3. Aortic Atherosclerosis (ICD10-I70.0) and Emphysema (ICD10-J43.9). Electronically Signed   By: Anner Crete M.D.   On: 03/24/2019 19:34   DG CHEST PORT 1 VIEW  Result Date: 04/02/2019 CLINICAL DATA:  Shortness of breath, history of COVID-19 positivity 1 month ago EXAM: PORTABLE CHEST 1 VIEW COMPARISON:  03/29/2019 FINDINGS: Cardiac shadow is stable. Increasing parenchymal opacity throughout both lungs is noted when compared with the prior exam. No sizable effusion is noted. No bony abnormality is seen. IMPRESSION: Diffuse increased parenchymal opacities bilaterally likely related to a combination of pulmonary edema and possibly superimposed pneumonia. Electronically Signed   By: Inez Catalina M.D.   On: 04/02/2019 10:27   DG CHEST PORT 1 VIEW  Result Date: 03/29/2019 CLINICAL DATA:  Hypoxia EXAM: PORTABLE CHEST 1 VIEW COMPARISON:  March 21, 2019 FINDINGS: The mediastinal contour and cardiac silhouette are stable. The heart size is enlarged. Increased pulmonary interstitium with patchy opacities are identified throughout the left lung and in the right mid and lower lung worsened compared prior exam. The bony structures are stable. IMPRESSION: Increased pulmonary interstitium with patchy opacities are identified throughout the left lung and in the right mid and  lower lung worsened compared prior exam, superimposed pneumonia is not excluded. Electronically Signed   By: Abelardo Diesel M.D.   On: 03/29/2019 08:10   DG CHEST PORT 1 VIEW  Result Date: 03/21/2019 CLINICAL DATA:  History of interstitial lung disease and COPD s/p vats on 11/09. Also diagnosed with COVID-19 EXAM: PORTABLE CHEST 1 VIEW COMPARISON:  02/10/2019 FINDINGS: Cardiomediastinal contours remain enlarged. Coarse interstitial prominence seen bilaterally with more dense consolidative changes in the left lung base. Also with patchy right lower lobe opacities. Resolution of subcutaneous emphysema seen along the right chest wall. No acute bone finding. IMPRESSION: 1. Resolution of subcutaneous emphysema along the right chest wall. 2. Bibasilar airspace opacities, left greater than right, suspicious for viral or atypical pneumonia superimposed on chronic lung disease given the patient's history. 3. Cardiomegaly as before. Electronically Signed   By: Zetta Bills M.D.   On: 03/21/2019 08:45   DG Swallowing Func-Speech Pathology  Result Date: 03/26/2019 Objective Swallowing Evaluation: Type of Study: MBS-Modified Barium Swallow Study  Patient Details Name: Justin Atkinson. MRN: [id number] Date of Birth: Sep 17, 1940 Today's Date: 03/26/2019 Time: SLP Start Time (ACUTE ONLY): 0800 -SLP Stop Time (ACUTE ONLY): 0902 SLP Time Calculation (min) (ACUTE ONLY): 62 min Past Medical History: Past Medical History: Diagnosis Date  Bladder cancer ([place]) [id number]  Surgery  Complication of anesthesia 2001  woke up during colonoscopy  COPD (chronic obstructive pulmonary disease) ([place])   "beginning Emphesma"  DDD (degenerative disc disease), cervical   Diabetes mellitus without complication (HCC)   DJD (degenerative joint disease)   Glaucoma   HTN (hypertension)   Hypercholesterolemia   Kidney anomaly, congenital   "born with one kidney"  Shortness of breath dyspnea   due to overweight and decreased exercise Past  Surgical History: Past Surgical History: Procedure Laterality Date  BLADDER SURGERY  2001  cystoscopic  CERVICAL DISC SURGERY  1989  COLONOSCOPY W/ POLYPECTOMY    COLONOSCOPY WITH PROPOFOL N/A 05/04/2015  Procedure: COLONOSCOPY WITH PROPOFOL;  Surgeon: Clarene Essex, MD;  Location: [facility] ENDOSCOPY;  Service: Endoscopy;  Laterality: N/A;  EYE SURGERY Bilateral   laser for Glaucoma  HERNIA REPAIR  1960  INGUINAL HERNIA REPAIR Right 08/01/2012  Procedure: HERNIA REPAIR INGUINAL ADULT;  Surgeon: Earnstine Regal, MD;  Location: [facility] ORS;  Service: General;  Laterality: Right;  INSERTION OF MESH Right 08/01/2012  Procedure: INSERTION OF MESH;  Surgeon: Earnstine Regal, MD;  Location: [facility] ORS;  Service: General;  Laterality: Right;  TONSILLECTOMY    VIDEO ASSISTED THORACOSCOPY (VATS)/WEDGE RESECTION Right 02/09/2019  Procedure: VIDEO ASSISTED THORACOSCOPY (VATS)/WEDGE RESECTION;  Surgeon: Lajuana Matte, MD;  Location: [facility];  Service: Thoracic;  Laterality: Right;  VIDEO BRONCHOSCOPY N/A 02/09/2019  Procedure: VIDEO BRONCHOSCOPY;  Surgeon: Lajuana Matte, MD;  Location: [facility] OR;  Service: Thoracic;  Laterality: N/A; HPI: Pt is a 79 y.o. male recently admitted s/p VATS with lobe wedge resection (02/09/2019), now admitted 02/28/2019 with worsening cough and SOB; tested (+) COVID-19. [name] oxygen needs were not improving and critical care requested MBS. Other PMH includes COPD/pulmonary fibrosis, cervical DDD, HTN, DM, HLD.  Subjective: pt denies any subjective difficulties swallowing Assessment / Plan / Recommendation CHL IP CLINICAL IMPRESSIONS 03/26/2019 Clinical Impression  Pt has a moderate pharyngeal dysphagia that appears to be acute on chronic in nature given the appearance of large, bridging osteophytes that protrude his posterior pharyngeal wall and limit full range of epiglottic deflection. Throughout the study pt was on 15L via HFNC, needing intermittent use of NRB for desaturations (lowest SpO2 reading in the 70s  after transferring, for which PT was present to assist). Thin liquids are seen entering the laryngeal vestibule as he swallows given incomplete LVC, and although visibility is limited at times given shadow from shoulder, there is defiinitive aspiration noted. Pt only coughed x1 when aspirating a larger volume, and his cough did not clear his airway effectively. Nectar thick liquids are also penetrated but exit the laryngeal vestibule consistently. Pt has vallecular residue across consistencies, although this was not observed to enter the airway during testing and was reduced as he subsequent swallows were performed. A chin tuck did not improve swallowing safety or efficiency (in fact, it may have worsened it), but a head turn to the right helped him swallow thin liquids even via large, consecutive straw sips.  Given the silent nature of his aspriation and the primary structural component it is possible that the pt has been aspirating more chronically PTA, although he denies a h/o dysphagia or PNA. In the setting of acute illness and respiratory decline, I suspect that he may be aspirating more (volume or frequency) and/or he may not be able to compensate for or tolerate the aspiration that occurs. Extensive education was provided after the study back in his room, at which time I reviewed the above. We discussed options such as continuing thin liquids with a head turn to the right, needing to try to be very consistent in the use of this strategy. He could also consider a trial of nectar thick liquids (perhaps with water protocol) if he feels like he will not be able to use the head turn as much as he will need to. At this time, pt would like to try to stay on regular solids and thin liquids with use of strategies. SLP  provided signage at his [facility] and in front of him where he can see it to help serve as a reminder. Will need additional SLP f/u for safety and to maximize swallow function.  SLP Visit Diagnosis Dysphagia,  pharyngeal phase (R13.13) Attention and concentration deficit following -- Frontal lobe and executive function deficit following -- Impact on safety and function Moderate aspiration risk   CHL IP TREATMENT RECOMMENDATION 03/26/2019 Treatment Recommendations Therapy as outlined in treatment plan below   Prognosis 03/26/2019 Prognosis for Safe Diet Advancement Fair Barriers to Reach Goals Other (Comment) Barriers/Prognosis Comment -- CHL IP DIET RECOMMENDATION 03/26/2019 SLP Diet Recommendations Regular solids;Thin liquid Liquid Administration via Cup;Straw Medication Administration Whole meds with puree Compensations Slow rate;Small sips/bites;Other (Comment) Postural Changes Seated upright at 90 degrees;Remain semi-upright after after feeds/meals (Comment)   CHL IP OTHER RECOMMENDATIONS 03/26/2019 Recommended Consults -- Oral Care Recommendations Oral care BID Other Recommendations --   CHL IP FOLLOW UP RECOMMENDATIONS 03/26/2019 Follow up Recommendations Inpatient Rehab   CHL IP FREQUENCY AND DURATION 03/26/2019 Speech Therapy Frequency (ACUTE ONLY) min 2x/week Treatment Duration 2 weeks      CHL IP ORAL PHASE 03/26/2019 Oral Phase WFL Oral - Pudding Teaspoon -- Oral - Pudding Cup -- Oral - Honey Teaspoon -- Oral - Honey Cup -- Oral - Nectar Teaspoon -- Oral - Nectar Cup -- Oral - Nectar Straw -- Oral - Thin Teaspoon -- Oral - Thin Cup -- Oral - Thin Straw -- Oral - Puree -- Oral - Mech Soft -- Oral - Regular -- Oral - Multi-Consistency -- Oral - Pill -- Oral Phase - Comment --  CHL IP PHARYNGEAL PHASE 03/26/2019 Pharyngeal Phase Impaired Pharyngeal- Pudding Teaspoon -- Pharyngeal -- Pharyngeal- Pudding Cup -- Pharyngeal -- Pharyngeal- Honey Teaspoon -- Pharyngeal -- Pharyngeal- Honey Cup -- Pharyngeal -- Pharyngeal- Nectar Teaspoon -- Pharyngeal -- Pharyngeal- Nectar Cup Reduced epiglottic inversion;Penetration/Aspiration during swallow;Pharyngeal residue - valleculae Pharyngeal Material enters airway, remains  ABOVE vocal cords then ejected out Pharyngeal- Nectar Straw Reduced epiglottic inversion;Penetration/Aspiration during swallow;Pharyngeal residue - valleculae Pharyngeal Material enters airway, remains ABOVE vocal cords then ejected out Pharyngeal- Thin Teaspoon -- Pharyngeal -- Pharyngeal- Thin Cup Reduced epiglottic inversion;Penetration/Aspiration during swallow;Pharyngeal residue - valleculae Pharyngeal Material enters airway, passes BELOW cords without attempt by patient to eject out (silent aspiration) Pharyngeal- Thin Straw Reduced epiglottic inversion;Penetration/Aspiration during swallow;Pharyngeal residue - valleculae Pharyngeal Material enters airway, passes BELOW cords without attempt by patient to eject out (silent aspiration) Pharyngeal- Puree Reduced epiglottic inversion;Pharyngeal residue - valleculae Pharyngeal -- Pharyngeal- Mechanical Soft Reduced epiglottic inversion;Pharyngeal residue - valleculae Pharyngeal -- Pharyngeal- Regular -- Pharyngeal -- Pharyngeal- Multi-consistency -- Pharyngeal -- Pharyngeal- Pill -- Pharyngeal -- Pharyngeal Comment --  CHL IP CERVICAL ESOPHAGEAL PHASE 03/26/2019 Cervical Esophageal Phase (No Data) Pudding Teaspoon -- Pudding Cup -- Honey Teaspoon -- Honey Cup -- Nectar Teaspoon -- Nectar Cup -- Nectar Straw -- Thin Teaspoon -- Thin Cup -- Thin Straw -- Puree -- Mechanical Soft -- Regular -- Multi-consistency -- Pill -- Cervical Esophageal Comment -- 03/26/2019, 9:54 AM  Osie Bond., M.A. [facility] Pager [phone] Office [phone]             ECHOCARDIOGRAM COMPLETE  Result Date: 03/23/2019   ECHOCARDIOGRAM REPORT   Patient Name:   Justin Atkinson. Date of Exam: 03/23/2019 Medical Rec #:  [id number]           Height:       73.0 in Accession #:    [id number]  Weight:       273.8 lb Date of Birth:  03-17-1941            BSA:          2.46 m Patient Age:    65 years            BP:           133/76 mmHg Patient Gender: M                    HR:           73 bpm. Exam Location:  Inpatient Procedure: 2D Echo Indications:    Elevated brain natriuretic peptide (BNP) level [id number]  History:        Patient has no prior history of Echocardiogram examinations.                 COPD/pulmonary fibrosis,; Risk Factors:Diabetes, Dyslipidemia                 and Hypertension. Acute Hypoxic Respiratory Failure due to                 Covid-19 Viral Illness.  Sonographer:    Darlina Sicilian RDCS Referring Phys: [phone] A CALDWELL POWELL JR  Sonographer Comments: Esmond Plants. Patient sitting upright for oxygen demands IMPRESSIONS  1. Left ventricular ejection fraction, by visual estimation, is 55 to 60%. The left ventricle has normal function. There is moderately increased left ventricular hypertrophy.  2. Left ventricular diastolic parameters are indeterminate.  3. The left ventricle has no regional wall motion abnormalities.  4. Global right ventricle has normal systolic function.The right ventricular size is normal. No increase in right ventricular wall thickness.  5. Left atrial size was normal.  6. Right atrial size was normal.  7. The mitral valve is normal in structure. No evidence of mitral valve regurgitation. No evidence of mitral stenosis.  8. The tricuspid valve is normal in structure. Tricuspid valve regurgitation is not demonstrated.  9. The aortic valve is tricuspid. Aortic valve regurgitation is not visualized. Mild aortic valve sclerosis without stenosis. 10. Pulmonic regurgitation is mild. 11. The pulmonic valve was normal in structure. Pulmonic valve regurgitation is mild. 12. 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 13. The inferior vena cava is normal in size with greater than 50% respiratory variability, suggesting right atrial pressure of 3 mmHg. FINDINGS  Left Ventricle: Left ventricular ejection fraction, by visual estimation, is 55 to 60%. The left ventricle has normal function. The left ventricle has no  regional wall motion abnormalities. There is moderately increased left ventricular hypertrophy. Left ventricular diastolic parameters are indeterminate. Normal left atrial pressure. Right Ventricle: The right ventricular size is normal. No increase in right ventricular wall thickness. Global RV systolic function is has normal systolic function. Left Atrium: Left atrial size was normal in size. Right Atrium: Right atrial size was normal in size Pericardium: There is no evidence of pericardial effusion. Mitral Valve: The mitral valve is normal in structure. No evidence of mitral valve regurgitation. No evidence of mitral valve stenosis by observation. Tricuspid Valve: The tricuspid valve is normal in structure. Tricuspid valve regurgitation is not demonstrated. Aortic Valve: The aortic valve is tricuspid. Aortic valve regurgitation is not visualized. Mild aortic valve sclerosis is present, with no evidence of aortic valve stenosis. Pulmonic Valve: The pulmonic valve was normal in structure. Pulmonic valve regurgitation is mild. Pulmonic regurgitation is mild. Aorta: The aortic root, ascending  aorta and aortic arch are all structurally normal, with no evidence of dilitation or obstruction. 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 Venous: The inferior vena cava is normal in size with greater than 50% respiratory variability, suggesting right atrial pressure of 3 mmHg. IAS/Shunts: No atrial level shunt detected by color flow Doppler. There is no evidence of a patent foramen ovale. No ventricular septal defect is seen or detected. There is no evidence of an atrial septal defect.  LEFT VENTRICLE PLAX 2D LVIDd:         3.27 cm LVIDs:         2.56 cm LV PW:         1.44 cm LV IVS:        1.91 cm LVOT diam:     2.70 cm LV SV:         19 ml LV SV Index:   7.58 LVOT Area:     5.73 cm  LEFT ATRIUM         Index LA diam:    3.10 cm 1.26 cm/m   AORTA Ao Root diam: 4.20 cm Ao Asc diam:  4.20 cm MITRAL  VALVE MV Area (PHT): 3.91 cm             SHUNTS MV PHT:        56.26 msec           Systemic Diam: 2.70 cm MV Decel Time: 194 msec MV E velocity: 63.10 cm/s 103 cm/s MV A velocity: 56.30 cm/s 70.3 cm/s MV E/A ratio:  1.12       1.5  Candee Furbish MD Electronically signed by Candee Furbish MD Signature Date/Time: 03/23/2019/10:39:34 AM    Final    VAS [place] LOWER EXTREMITY VENOUS (DVT)  Result Date: 03/21/2019  Lower Venous Study Indications: Swelling.  Risk Factors: COVID 19 positive. Limitations: Body habitus and poor ultrasound/tissue interface. Comparison Study: No prior studies. Performing Technologist: Oliver Hum RVT  Examination Guidelines: A complete evaluation includes B-mode imaging, spectral Doppler, color Doppler, and power Doppler as needed of all accessible portions of each vessel. Bilateral testing is considered an integral part of a complete examination. Limited examinations for reoccurring indications may be performed as noted.  +---------+---------------+---------+-----------+----------+--------------+  RIGHT     Compressibility Phasicity Spontaneity Properties Thrombus Aging  +---------+---------------+---------+-----------+----------+--------------+  CFV       Full            Yes       Yes                                    +---------+---------------+---------+-----------+----------+--------------+  SFJ       Full                                                             +---------+---------------+---------+-----------+----------+--------------+  FV Prox   Full                                                             +---------+---------------+---------+-----------+----------+--------------+  FV Mid    Full                                                             +---------+---------------+---------+-----------+----------+--------------+  FV Distal Full                                                             +---------+---------------+---------+-----------+----------+--------------+   PFV       Full                                                             +---------+---------------+---------+-----------+----------+--------------+  POP       Full            Yes       Yes                                    +---------+---------------+---------+-----------+----------+--------------+  PTV       Full                                                             +---------+---------------+---------+-----------+----------+--------------+  PERO      Full                                                             +---------+---------------+---------+-----------+----------+--------------+   +---------+---------------+---------+-----------+----------+--------------+  LEFT      Compressibility Phasicity Spontaneity Properties Thrombus Aging  +---------+---------------+---------+-----------+----------+--------------+  CFV       Full            Yes       Yes                                    +---------+---------------+---------+-----------+----------+--------------+  SFJ       Full                                                             +---------+---------------+---------+-----------+----------+--------------+  FV Prox   Full                                                             +---------+---------------+---------+-----------+----------+--------------+  FV Mid    Full                                                             +---------+---------------+---------+-----------+----------+--------------+  FV Distal Full                                                             +---------+---------------+---------+-----------+----------+--------------+  PFV       Full                                                             +---------+---------------+---------+-----------+----------+--------------+  POP       Full            Yes       Yes                                    +---------+---------------+---------+-----------+----------+--------------+  PTV       Full                                                              +---------+---------------+---------+-----------+----------+--------------+  PERO      Full                                                             +---------+---------------+---------+-----------+----------+--------------+     Summary: Right: There is no evidence of deep vein thrombosis in the lower extremity. No cystic structure found in the popliteal fossa. Left: There is no evidence of deep vein thrombosis in the lower extremity. No cystic structure found in the popliteal fossa.  *See table(s) above for measurements and observations. Electronically signed by Harold Barban MD on 03/21/2019 at 4:46:22 PM.    Final     Microbiology: Recent Results (from the past 240 hour(s))  Culture, blood (Routine X 2) w Reflex to ID Panel     Status: None (Preliminary result)   Collection Time: 04/03/19 11:08 AM   Specimen: BLOOD  Result Value Ref Range Status   Specimen Description   Final    BLOOD RIGHT ARM Performed at [facility], [place] [address], [place], [place] [address]    Special Requests   Final    BOTTLES DRAWN AEROBIC AND ANAEROBIC Blood Culture adequate volume Performed at [place] [address], [place], [place] [address]    Culture   Final    NO GROWTH 3 DAYS Performed at [facility], [place] [address], [place], [place] [address]    Report Status PENDING  Incomplete  Culture, blood (Routine X 2) w Reflex to ID Panel     Status: None (Preliminary result)   Collection Time: 04/03/19 11:15 AM   Specimen: BLOOD  Result Value Ref Range Status   Specimen Description   Final    BLOOD RIGHT ARM Performed at [place] [address], [place], [place] [address]    Special Requests   Final    BOTTLES DRAWN AEROBIC ONLY Blood Culture adequate volume Performed at [place] [address] [place], [place] [address]    Culture   Final    NO GROWTH 3 DAYS Performed at  [facility], [place] [address], [place], [place] [address]    Report Status PENDING  Incomplete     Labs: Basic Metabolic Panel: Recent Labs  Lab 04/02/19 0125 04/03/19 0912 04/04/19 0119 04/04/19 1128 04/05/19 0413 05/06/2019 0705  NA 138 143  --  146* 144 141  K 4.2 3.5  --  3.9 3.7 3.6  CL 92* 94*  --  97* 96* 90*  CO2 34* 33*  --  34* 33* 34*  GLUCOSE 104* 121*  --  146* 98 102*  BUN 32* 36*  --  53* 67* 86*  CREATININE 0.68 0.85  --  1.09 1.42* 1.98*  CALCIUM 9.2 9.2  --  9.2 9.1 9.0  MG 2.4 2.4 2.4  --  2.6* 2.7*  PHOS 4.6 4.9* 4.8*  --  8.2* 9.6*   Liver Function Tests: Recent Labs  Lab 04/02/19 0125 04/03/19 0912 04/04/19 1128 04/05/19 0413 2019/05/06 0705  AST 22 18 20 19  34  ALT 35 34 34 31 48*  ALKPHOS 79 86 90 78 81  BILITOT 0.8 1.1 1.2 1.2 1.2  PROT 5.8* 6.1* 6.3* 5.8* 6.0*  ALBUMIN 2.8* 2.8* 2.9* 2.8* 3.0*   No results for input(s): LIPASE, AMYLASE in the last 168 hours. No results for input(s): AMMONIA in the last 168 hours. CBC: Recent Labs  Lab 04/02/19 0125 04/03/19 0912 04/04/19 0119 04/05/19 0413 05/06/2019 0705  WBC 10.1 10.4 14.0* 10.8* 10.9*  NEUTROABS 9.2* 9.5* 12.5* 9.4* 9.7*  HGB 12.9* 13.6 14.8 13.8 14.6  HCT 40.7 43.2 46.8 45.1 48.0  MCV 97.4 96.4 97.9 99.3 99.4  PLT 190 215 240 186 165   Cardiac Enzymes: No results for input(s): CKTOTAL, CKMB, CKMBINDEX, TROPONINI in the last 168 hours. D-Dimer Recent Labs    04/05/19 0413 05-06-2019 0705  DDIMER 0.99* 1.18*   BNP: Invalid input(s): POCBNP CBG: Recent Labs  Lab 04/05/19 1114 04/05/19 1546 04/05/19 2045 2019-05-06 0857 05-06-19 1200  GLUCAP 128* 134* 117* 146* 125*   Anemia work up Recent Labs    04/05/19 0413 05-06-2019 0705  FERRITIN 523* 665*   Urinalysis    Component Value Date/Time   COLORURINE YELLOW 03/01/2019 1915   APPEARANCEUR CLEAR 02/16/2019 1915   LABSPEC >1.046 (H) 02/26/2019 1915   PHURINE 6.0 02/24/2019 1915   GLUCOSEU NEGATIVE 02/07/2019  1915   HGBUR NEGATIVE 02/26/2019 1915   [place] NEGATIVE 02/20/2019 1915   KETONESUR NEGATIVE 03/02/2019 1915   PROTEINUR NEGATIVE 02/19/2019 1915   NITRITE NEGATIVE 02/20/2019 1915   LEUKOCYTESUR NEGATIVE 02/02/2019 1915   Sepsis Labs Invalid input(s): PROCALCITONIN,  WBC,  LACTICIDVEN     SIGNED:  Annita Brod, MD  [facility] 05/06/2019, 4:12 PM Pager   If 7PM-7AM, please  contact night-coverage [email or web address] Password [email or web address]

## 2019-05-04 NOTE — Progress Notes (Addendum)
Follow up for support at bedside due to page from RN.     Support at end of life

## 2019-05-04 NOTE — Progress Notes (Signed)
Speech pathology:  Pt with deteriorating medical condition with poor prognosis.  No SLP intervention is appropriate at this time; our service will respectfully sign off.   Maral Lampe L. Tivis Ringer, [place] Office number [phone] Pager [phone]

## 2019-05-04 NOTE — Progress Notes (Signed)
Inpatient Rehabilitation Admissions Coordinator  Noted pt's medical decline. I will follow at a distance.  Danne Baxter, RN, MSN Rehab Admissions Coordinator [phone] 04/08/19 9:58 AM

## 2019-05-04 NOTE — Progress Notes (Addendum)
Asystole noted on the monitor. O2 sats not picking up on monitor. Upon arrival to room, found to have pulled off oxygen. Placed NRB , Heated HFNC 50L 100% back on patient. Skin dusky/mottled appearance, unresponsive, Pupils 61mm sluggish bilaterally.Unable to obtain pulse by palpating. Agonal breathing noted. Bp 75/36 MAP 49. Notified Dr Shanon Brow , Charge nurse,  And Marcello Moores, RN/ICU.    [phone]- Responding to commands to squeeze with hands. Answering yes/no questions. Sats 80s on current O2 settings. Hr 100-125/ afibb. Lethargic. Skin color returning but very pale. Bilateral soft mittens placed in attempt to prevent him from removing oxygen again.  At Bedside continuing to monitor.   0425: notified Family of changes.   This note is made by Costella Hatcher, RN

## 2019-05-04 DEATH — deceased

## 2020-03-27 IMAGING — DX DG CHEST 1V PORT
1 series · 1 of 1 positions shown · non-contrast
Comparison: February 10, 2019

CLINICAL DATA: Chest tube removal

EXAM:
PORTABLE CHEST 1 VIEW

[chest ap]
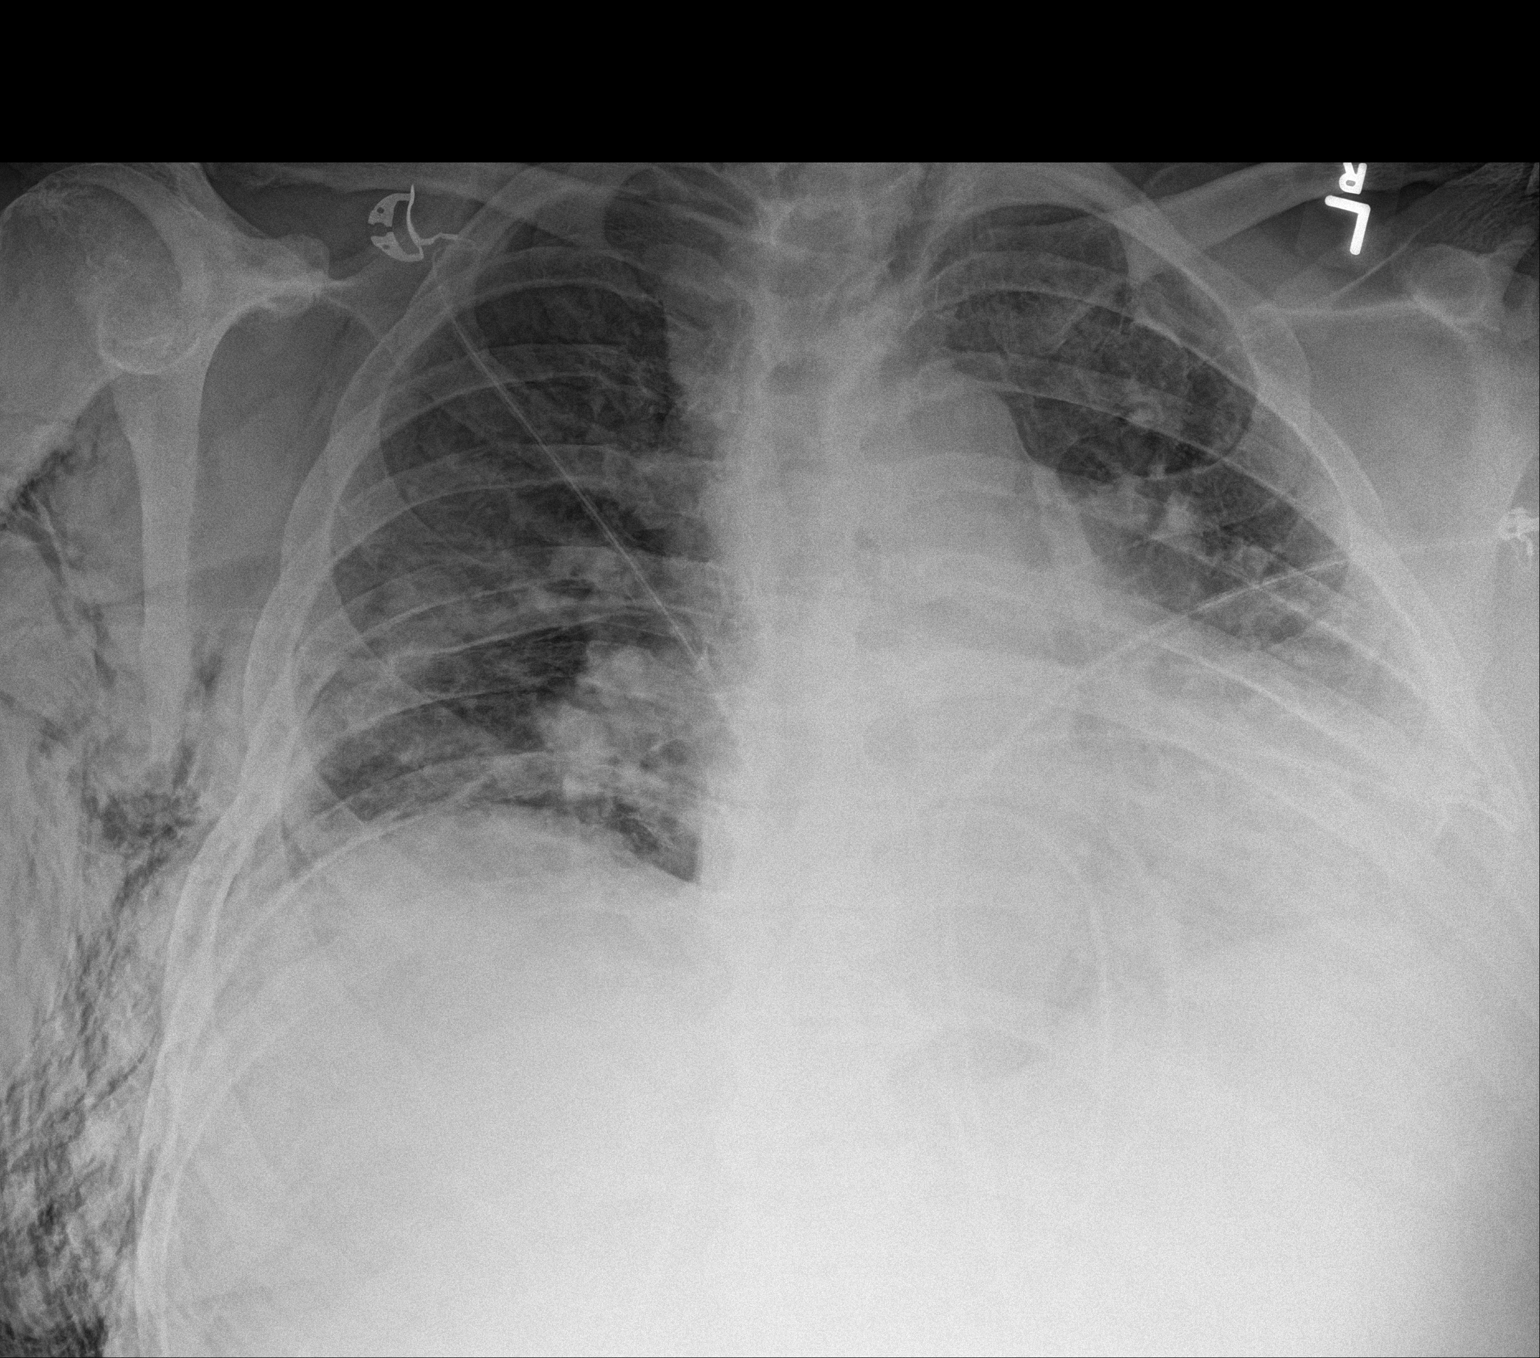

[1 of 1 positions shown; findings below may reference images not displayed]

FINDINGS: Interval removal of right chest tube with new right chest wall
subcutaneous emphysema. No definite pneumothorax. Low lung volumes
with bibasilar atelectasis. Stable cardiomediastinal contours.
IMPRESSION: Post right chest tube removal with new subcutaneous emphysema. No
definite pneumothorax.

## 2020-04-05 IMAGING — DX DG CHEST 2V
2 series · 2 of 2 positions shown · non-contrast
Comparison: 02/11/2019, 02/09/2019

CLINICAL DATA: Interstitial lung disease, COPD, diabetes mellitus,
hypertension

EXAM:
CHEST - 2 VIEW

[dg chest 2 view (1 of 2)]
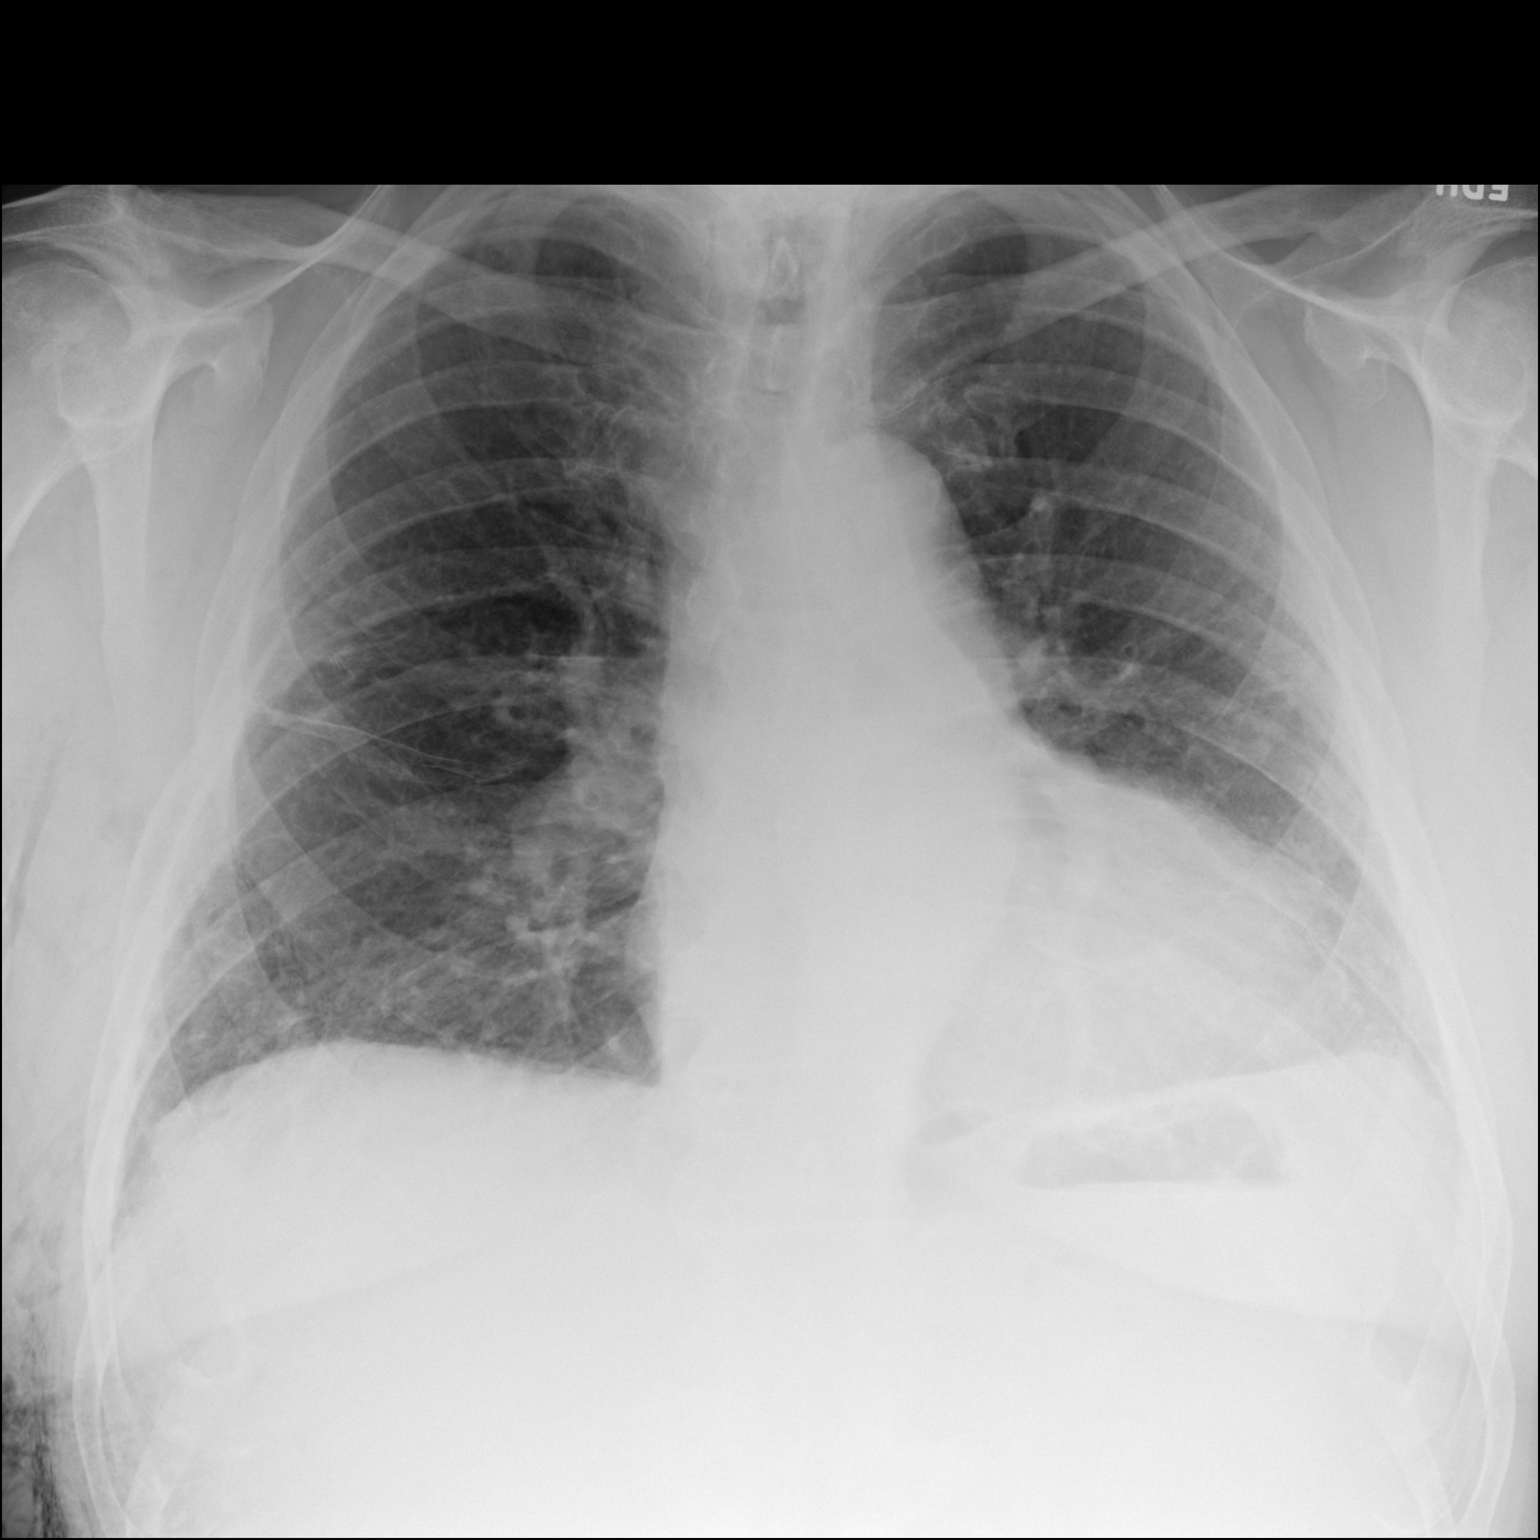

[dg chest 2 view (2 of 2)]
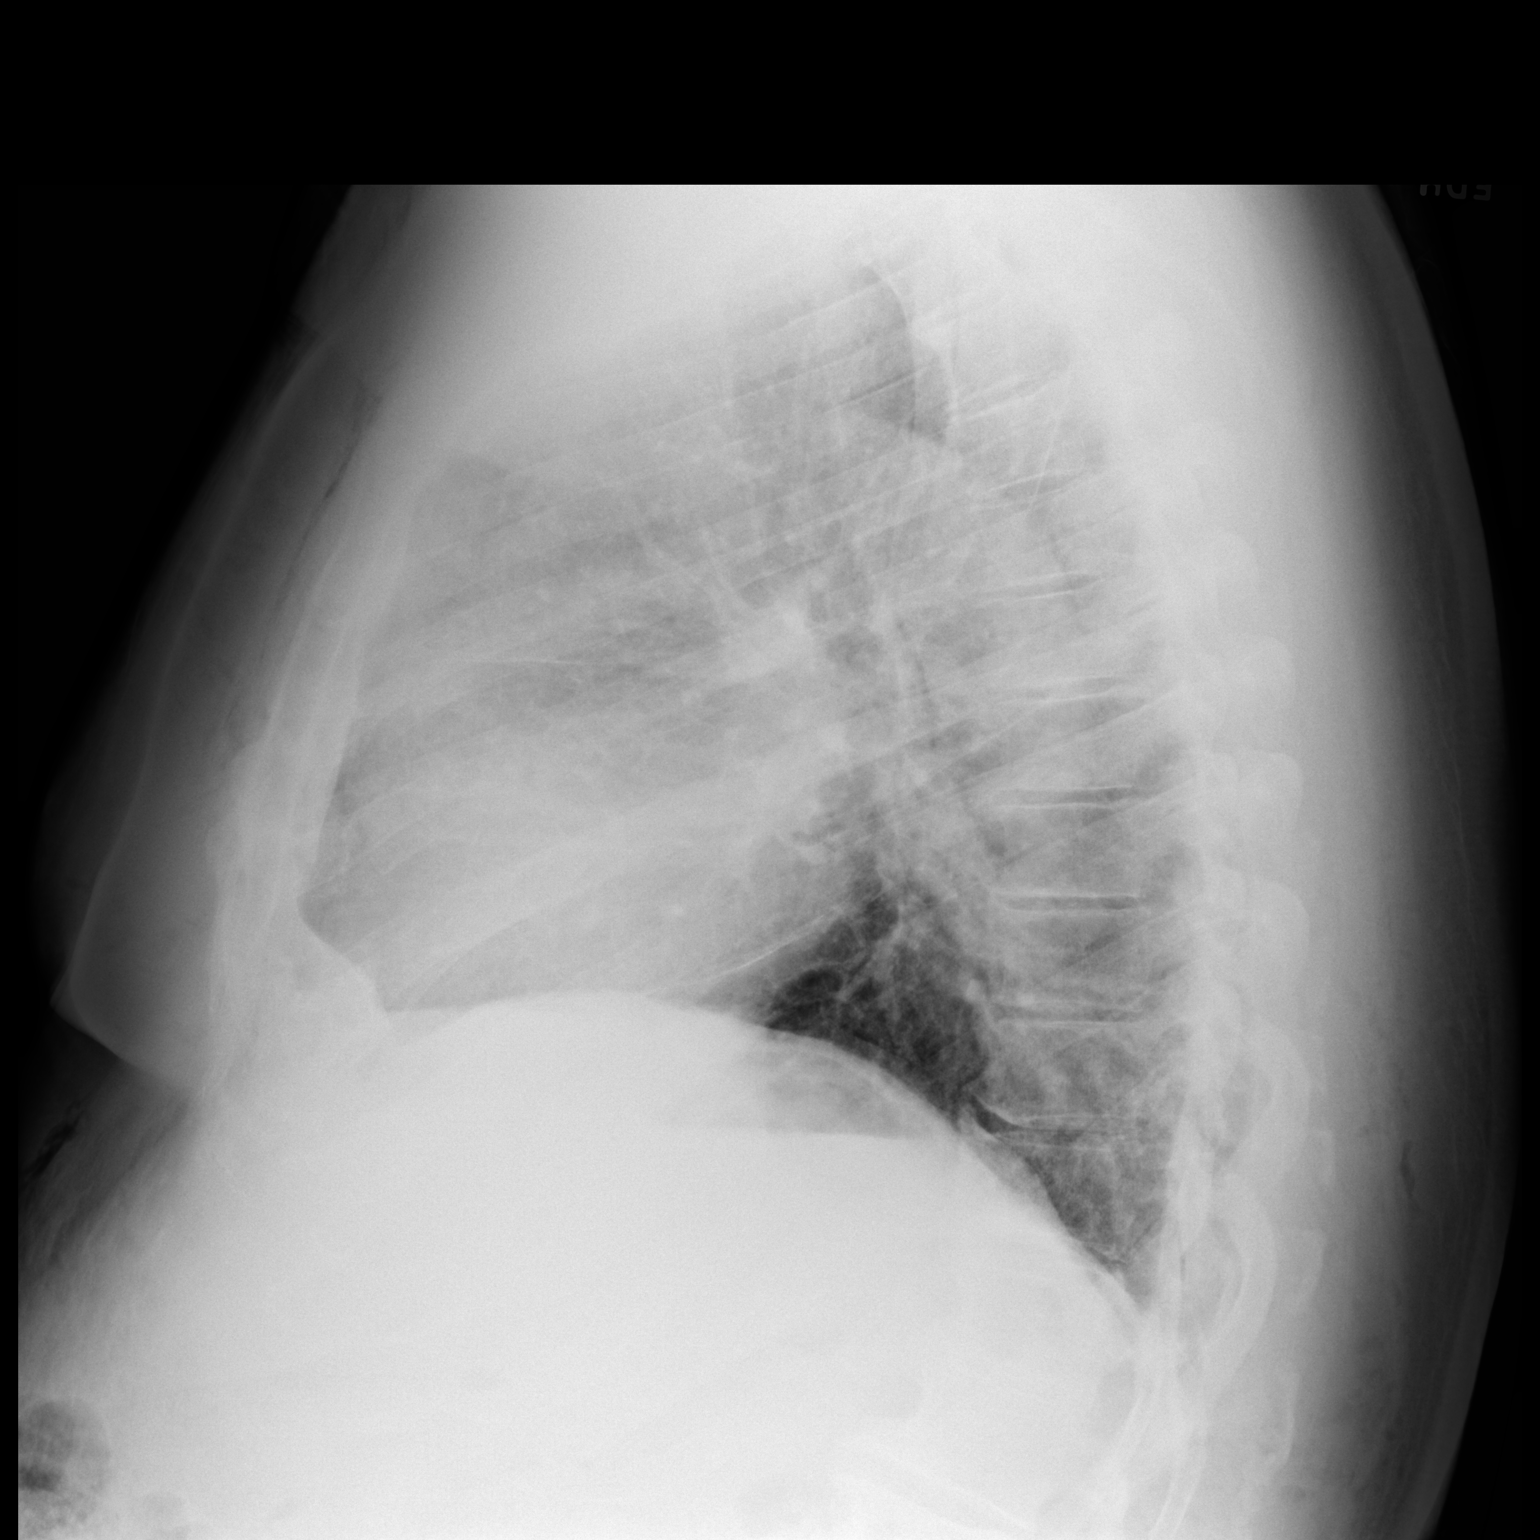

[2 of 2 positions shown; findings below may reference images not displayed]

FINDINGS: Enlargement of cardiac silhouette.

Mediastinal contours and pulmonary vascularity normal.

Mild accentuation of interstitial markings in the periphery of both
lungs.

No acute infiltrate, pleural effusion or pneumothorax.

Osseous structures unremarkable.
IMPRESSION: Peripheral interstitial prominence in the lungs bilaterally,
slightly greater at bases.

No definite acute infiltrate.

Mild enlargement of cardiac silhouette.

## 2020-05-04 IMAGING — DX DG CHEST 1V PORT
1 series · 1 of 1 positions shown · non-contrast
Comparison: 02/27/2019

CLINICAL DATA: History of interstitial lung disease and COPD s/p
vats on [DATE]. Also diagnosed with [id number]

EXAM:
PORTABLE CHEST 1 VIEW

[chest]
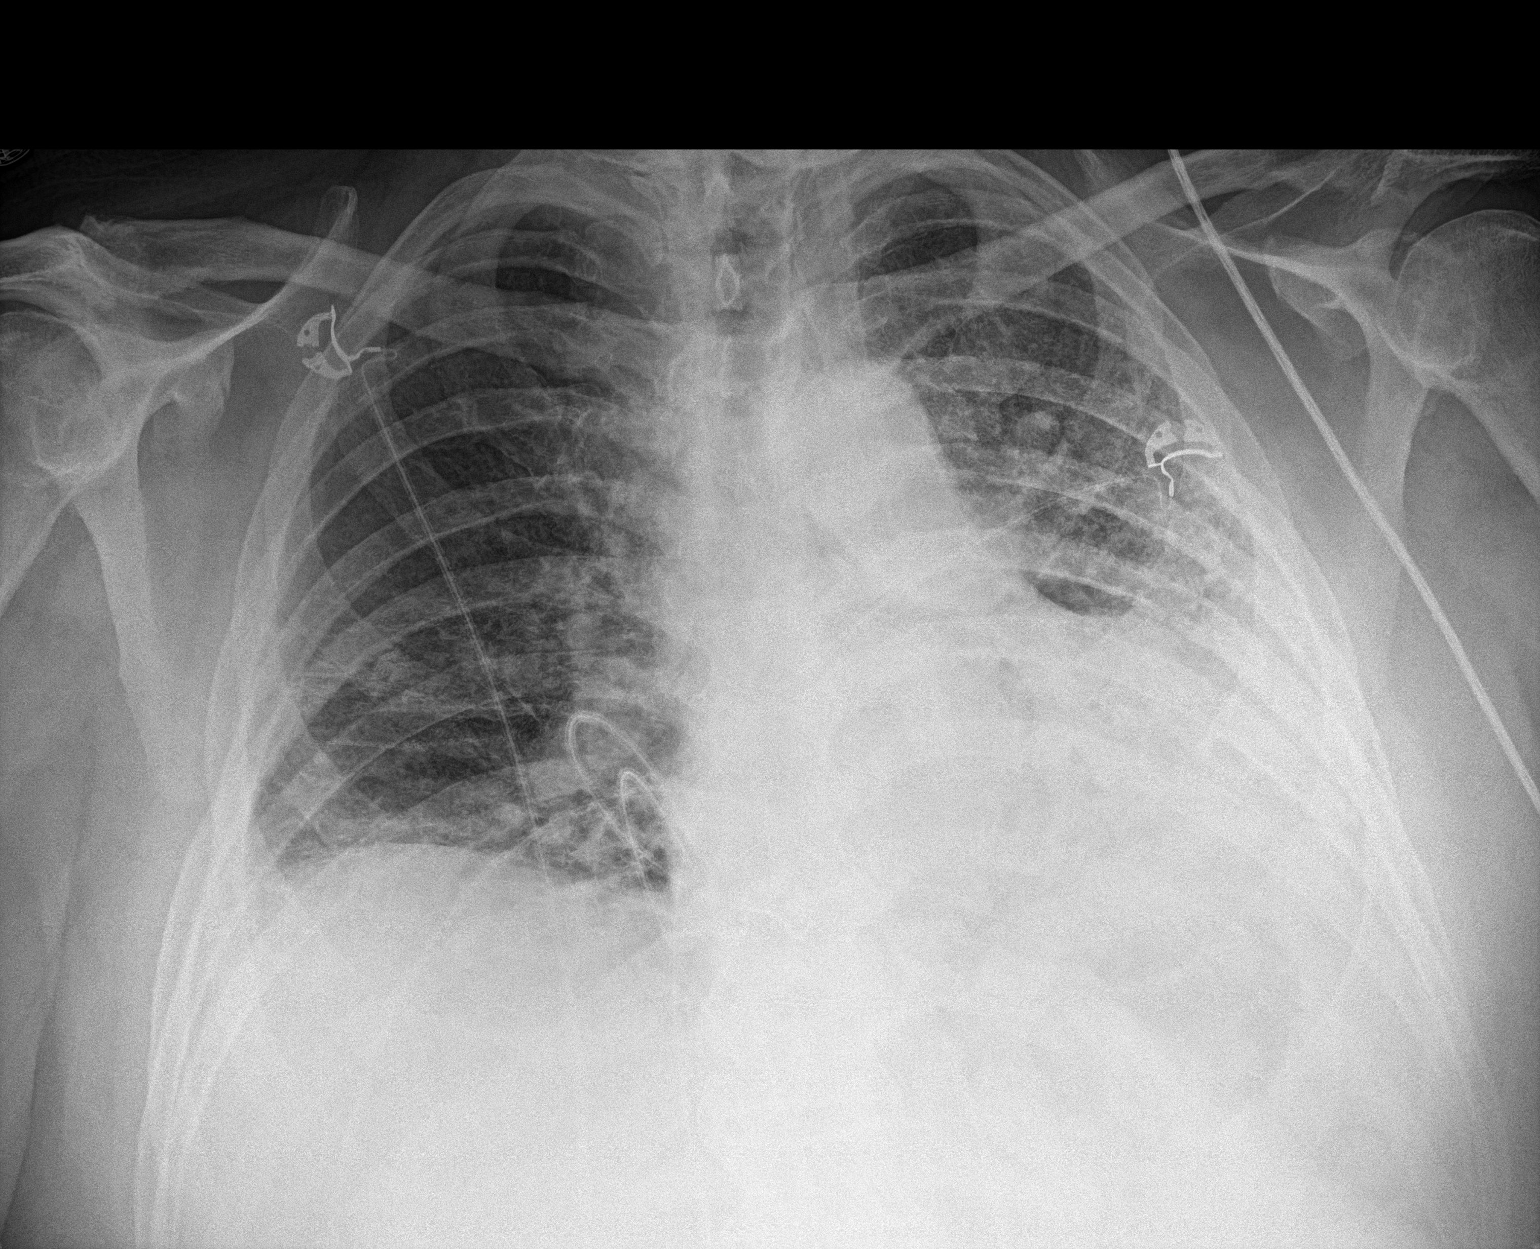

[1 of 1 positions shown; findings below may reference images not displayed]

FINDINGS: Cardiomediastinal contours remain enlarged. Coarse interstitial
prominence seen bilaterally with more dense consolidative changes in
the left lung base. Also with patchy right lower lobe opacities.

Resolution of subcutaneous emphysema seen along the right chest
wall. No acute bone finding.
IMPRESSION: 1. Resolution of subcutaneous emphysema along the right chest wall.
2. Bibasilar airspace opacities, left greater than right, suspicious
for viral or atypical pneumonia superimposed on chronic lung disease
given the patient's history.
3. Cardiomegaly as before.

## 2020-05-12 IMAGING — DX DG CHEST 1V PORT
1 series · 1 of 1 positions shown · non-contrast
Comparison: March 21, 2019

CLINICAL DATA: Hypoxia

EXAM:
PORTABLE CHEST 1 VIEW

[chest]
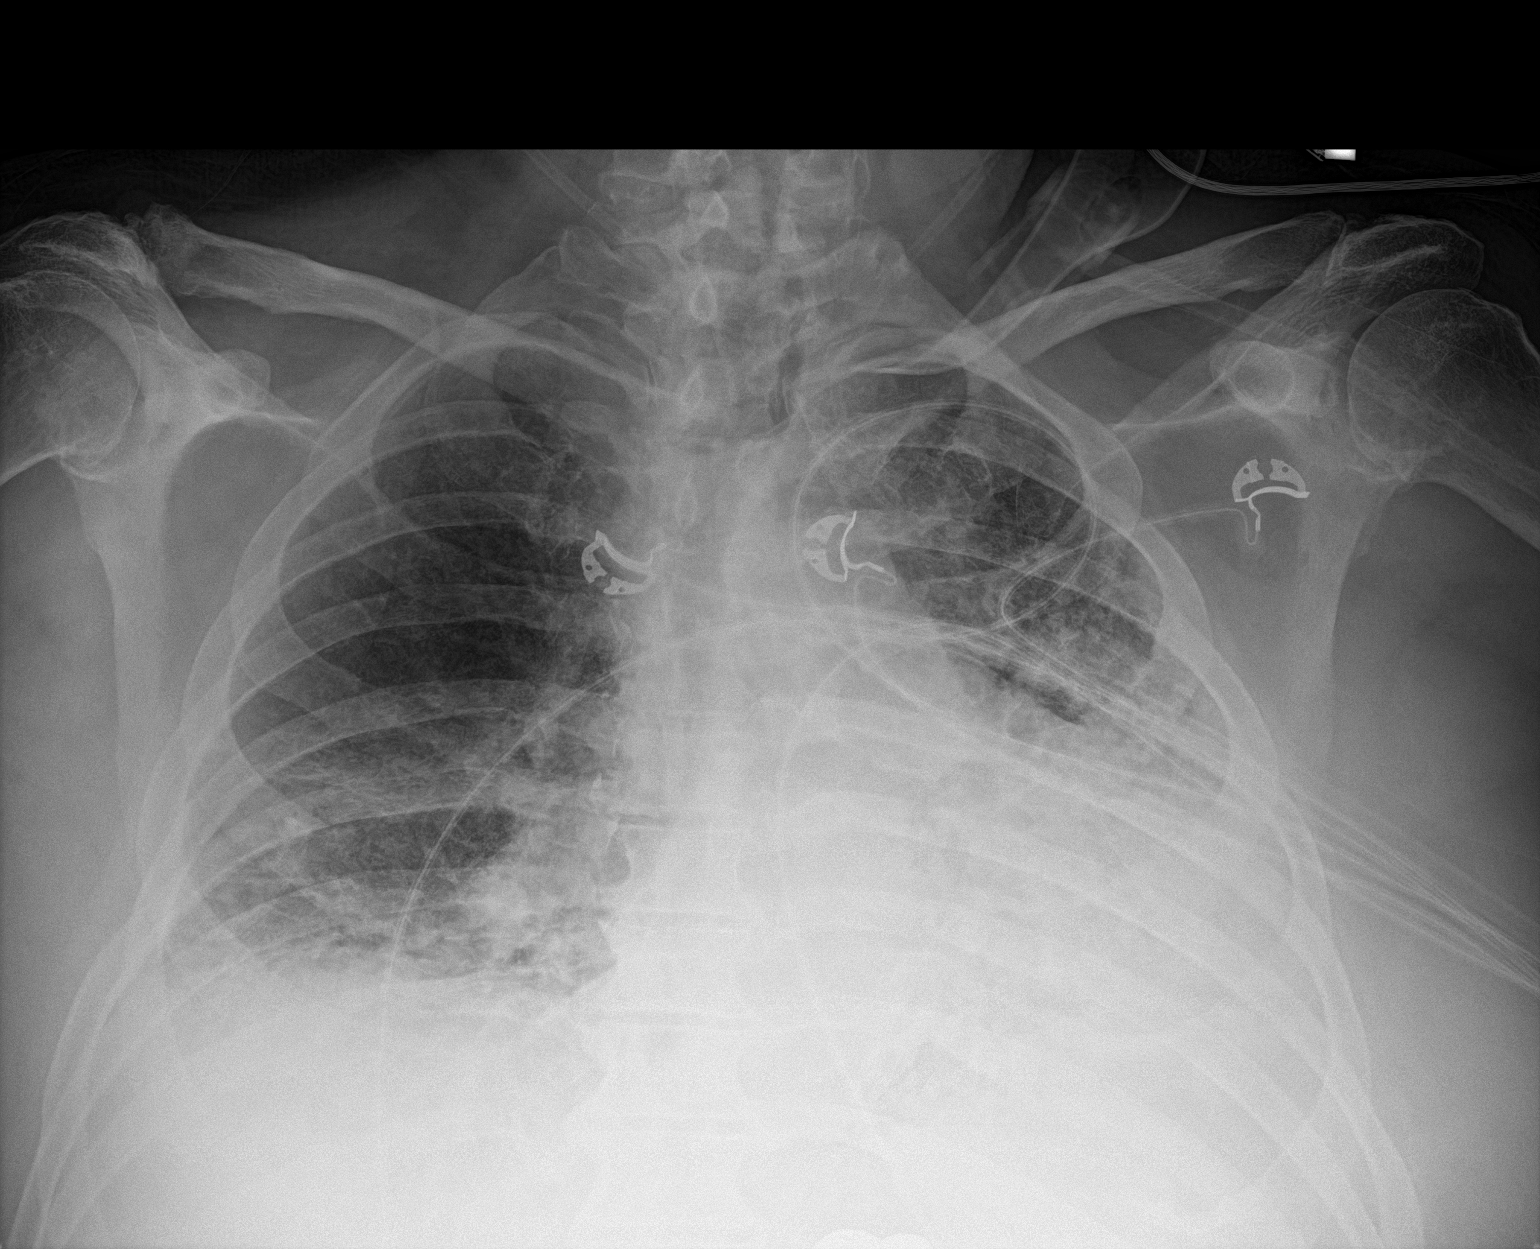

[1 of 1 positions shown; findings below may reference images not displayed]

FINDINGS: The mediastinal contour and cardiac silhouette are stable. The heart
size is enlarged. Increased pulmonary interstitium with patchy
opacities are identified throughout the left lung and in the right
mid and lower lung worsened compared prior exam. The bony structures
are stable.
IMPRESSION: Increased pulmonary interstitium with patchy opacities are
identified throughout the left lung and in the right mid and lower
lung worsened compared prior exam, superimposed pneumonia is not
excluded.
